# Patient Record
Sex: Female | Born: 1955 | Hispanic: No | Smoking: Never smoker
Health system: Southern US, Community
[De-identification: ages and names within clinical notes are randomized; demographics above are authoritative.]

## PROBLEM LIST (undated history)

## (undated) DIAGNOSIS — M797 Fibromyalgia: Secondary | ICD-10-CM

## (undated) DIAGNOSIS — E785 Hyperlipidemia, unspecified: Secondary | ICD-10-CM

## (undated) DIAGNOSIS — R569 Unspecified convulsions: Secondary | ICD-10-CM

## (undated) DIAGNOSIS — C719 Malignant neoplasm of brain, unspecified: Secondary | ICD-10-CM

## (undated) DIAGNOSIS — G43909 Migraine, unspecified, not intractable, without status migrainosus: Secondary | ICD-10-CM

## (undated) DIAGNOSIS — I10 Essential (primary) hypertension: Secondary | ICD-10-CM

## (undated) HISTORY — PX: APPENDECTOMY: SHX54

## (undated) HISTORY — PX: COLONOSCOPY: SHX174

## (undated) HISTORY — PX: BRAIN SURGERY: SHX531

## (undated) HISTORY — DX: Hyperlipidemia, unspecified: E78.5

---

## 1998-05-11 ENCOUNTER — Other Ambulatory Visit: Admission: RE | Admit: 1998-05-11 | Discharge: 1998-05-11 | Payer: Self-pay | Admitting: Obstetrics and Gynecology

## 1999-09-06 ENCOUNTER — Other Ambulatory Visit: Admission: RE | Admit: 1999-09-06 | Discharge: 1999-09-06 | Payer: Self-pay | Admitting: Obstetrics and Gynecology

## 2000-12-11 ENCOUNTER — Other Ambulatory Visit: Admission: RE | Admit: 2000-12-11 | Discharge: 2000-12-11 | Payer: Self-pay | Admitting: Obstetrics and Gynecology

## 2001-02-11 ENCOUNTER — Encounter: Admission: RE | Admit: 2001-02-11 | Discharge: 2001-05-12 | Payer: Self-pay | Admitting: Family Medicine

## 2011-09-24 ENCOUNTER — Ambulatory Visit (INDEPENDENT_AMBULATORY_CARE_PROVIDER_SITE_OTHER): Payer: BC Managed Care – PPO | Admitting: Family Medicine

## 2011-09-24 DIAGNOSIS — G43909 Migraine, unspecified, not intractable, without status migrainosus: Secondary | ICD-10-CM

## 2011-09-24 DIAGNOSIS — R5383 Other fatigue: Secondary | ICD-10-CM

## 2011-09-24 DIAGNOSIS — R5381 Other malaise: Secondary | ICD-10-CM

## 2011-09-24 DIAGNOSIS — I10 Essential (primary) hypertension: Secondary | ICD-10-CM | POA: Insufficient documentation

## 2011-09-24 DIAGNOSIS — E785 Hyperlipidemia, unspecified: Secondary | ICD-10-CM | POA: Insufficient documentation

## 2011-09-24 LAB — POCT CBC
Granulocyte percent: 65.7 %G (ref 37–80)
HCT, POC: 43.3 % (ref 37.7–47.9)
Hemoglobin: 13.8 g/dL (ref 12.2–16.2)
Lymph, poc: 2.8 (ref 0.6–3.4)
MCH, POC: 30.6 pg (ref 27–31.2)
MCHC: 31.9 g/dL (ref 31.8–35.4)
MCV: 95.9 fL (ref 80–97)
MID (cbc): 0.5 (ref 0–0.9)
MPV: 8.1 fL (ref 0–99.8)
POC Granulocyte: 6.2 (ref 2–6.9)
POC LYMPH PERCENT: 29.2 %L (ref 10–50)
POC MID %: 5.1 %M (ref 0–12)
Platelet Count, POC: 347 10*3/uL (ref 142–424)
RBC: 4.51 M/uL (ref 4.04–5.48)
RDW, POC: 13.7 %
WBC: 9.5 10*3/uL (ref 4.6–10.2)

## 2011-09-24 MED ORDER — LISINOPRIL-HYDROCHLOROTHIAZIDE 10-12.5 MG PO TABS: 1.0000 | ORAL_TABLET | Freq: Every day | ORAL | Status: DC

## 2011-09-24 MED ORDER — TOPIRAMATE 50 MG PO TABS: 50.0000 mg | ORAL_TABLET | Freq: Two times a day (BID) | ORAL | Status: DC

## 2011-09-24 NOTE — Progress Notes (Signed)
Addended by: Elvina Sidle on: 09/24/2011 07:58 PM   Modules accepted: Orders

## 2011-09-24 NOTE — Patient Instructions (Addendum)
Migraine Headache A migraine headache is an intense, throbbing pain on one or both sides of your head. The exact cause of a migraine headache is not always known. A migraine may be caused when nerves in the brain become irritated and release chemicals that cause swelling within blood vessels, causing pain. Many migraine sufferers have a family history of migraines. Before you get a migraine you may or may not get an aura. An aura is a group of symptoms that can predict the beginning of a migraine. An aura may include:  Visual changes such as:   Flashing lights.   Bright spots or zig-zag lines.   Tunnel vision.   Feelings of numbness.   Trouble talking.   Muscle weakness.  SYMPTOMS  Pain on one or both sides of your head.   Pain that is pulsating or throbbing in nature.   Pain that is severe enough to prevent daily activities.   Pain that is aggravated by any daily physical activity.   Nausea (feeling sick to your stomach), vomiting, or both.   Pain with exposure to bright lights, loud noises, or activity.   General sensitivity to bright lights or loud noises.  MIGRAINE TRIGGERS Examples of triggers of migraine headaches include:   Alcohol.   Smoking.   Stress.   It may be related to menses (female menstruation).   Aged cheeses.   Foods or drinks that contain nitrates, glutamate, aspartame, or tyramine.   Lack of sleep.   Chocolate.   Caffeine.   Hunger.   Medications such as nitroglycerine (used to treat chest pain), birth control pills, estrogen, and some blood pressure medications.  DIAGNOSIS  A migraine headache is often diagnosed based on:  Symptoms.   Physical examination.   A computerized X-ray scan (computed tomography, CT) of your head.  TREATMENT  Medications can help prevent migraines if they are recurrent or should they become recurrent. Your caregiver can help you with a medication or treatment program that will be helpful to you.   Lying  down in a dark, quiet room may be helpful.   Keeping a headache diary may help you find a trend as to what may be triggering your headaches.  SEEK IMMEDIATE MEDICAL CARE IF:   You have confusion, personality changes or seizures.   You have headaches that wake you from sleep.   You have an increased frequency in your headaches.   You have a stiff neck.   You have a loss of vision.   You have muscle weakness.   You start losing your balance or have trouble walking.   You feel faint or pass out.  MAKE SURE YOU:   Understand these instructions.   Will watch your condition.   Will get help right away if you are not doing well or get worse.  Document Released: 08/11/2005 Document Revised: 04/23/2011 Document Reviewed: 03/27/2009 [facility] Patient Information 2012 [place], [place].Hypertension As your heart beats, it forces blood through your arteries. This force is your blood pressure. If the pressure is too high, it is called hypertension (HTN) or high blood pressure. HTN is dangerous because you may have it and not know it. High blood pressure may mean that your heart has to work harder to pump blood. Your arteries may be narrow or stiff. The extra work puts you at risk for heart disease, stroke, and other problems.  Blood pressure consists of two numbers, a higher number over a lower, 110/72, for example. It is stated as "110  over 72." The ideal is below 120 for the top number (systolic) and under 80 for the bottom (diastolic). Write down your blood pressure today. You should pay close attention to your blood pressure if you have certain conditions such as:  Heart failure.   Prior heart attack.   Diabetes   Chronic kidney disease.   Prior stroke.   Multiple risk factors for heart disease.  To see if you have HTN, your blood pressure should be measured while you are seated with your arm held at the level of the heart. It should be measured at least twice. A one-time elevated  blood pressure reading (especially in the Emergency Department) does not mean that you need treatment. There may be conditions in which the blood pressure is different between your right and left arms. It is important to see your caregiver soon for a recheck. Most people have essential hypertension which means that there is not a specific cause. This type of high blood pressure may be lowered by changing lifestyle factors such as:  Stress.   Smoking.   Lack of exercise.   Excessive weight.   Drug/tobacco/alcohol use.   Eating less salt.  Most people do not have symptoms from high blood pressure until it has caused damage to the body. Effective treatment can often prevent, delay or reduce that damage. TREATMENT  When a cause has been identified, treatment for high blood pressure is directed at the cause. There are a large number of medications to treat HTN. These fall into several categories, and your caregiver will help you select the medicines that are best for you. Medications may have side effects. You should review side effects with your caregiver. If your blood pressure stays high after you have made lifestyle changes or started on medicines,   Your medication(s) may need to be changed.   Other problems may need to be addressed.   Be certain you understand your prescriptions, and know how and when to take your medicine.   Be sure to follow up with your caregiver within the time frame advised (usually within two weeks) to have your blood pressure rechecked and to review your medications.   If you are taking more than one medicine to lower your blood pressure, make sure you know how and at what times they should be taken. Taking two medicines at the same time can result in blood pressure that is too low.  SEEK IMMEDIATE MEDICAL CARE IF:  You develop a severe headache, blurred or changing vision, or confusion.   You have unusual weakness or numbness, or a faint feeling.   You have  severe chest or abdominal pain, vomiting, or breathing problems.  MAKE SURE YOU:   Understand these instructions.   Will watch your condition.   Will get help right away if you are not doing well or get worse.  Document Released: 08/11/2005 Document Revised: 04/23/2011 Document Reviewed: 03/31/2008 [facility] Patient Information 2012 [place], [place].Menopause Menopause is the normal time of life when menstrual periods stop completely. Menopause is complete when you have missed 12 consecutive menstrual periods. It usually occurs between the ages of 93 to 13, with an average age of 33. Very rarely does a woman develop menopause before 55 years old. At menopause, your ovaries stop producing the female hormones, estrogen and progesterone. This can cause undesirable symptoms and also affect your health. Sometimes the symptoms may occur 4 to 5 years before the menopause begins. There is no relationship between menopause and:  Oral contraceptives.   Number of children you had.   Race.   The age your menstrual periods started (menarche).  Heavy smokers and very thin women may develop menopause earlier in life. CAUSES  The ovaries stop producing the female hormones estrogen and progesterone.   Other causes include:   Surgery to remove both ovaries.   The ovaries stop functioning for no known reason.   Tumors of the pituitary gland in the brain.   Medical disease that affects the ovaries and hormone production.   Radiation treatment to the abdomen or pelvis.   Chemotherapy that affects the ovaries.  SYMPTOMS   Hot flashes.   Night sweats.   Decrease in sex drive.   Vaginal dryness and thinning of the vagina causing painful intercourse.   Dryness of the skin and developing wrinkles.   Headaches.   Tiredness.   Irritability.   Memory problems.   Weight gain.   Bladder infections.   Hair growth of the face and chest.   Infertility.  More serious symptoms  include:  Loss of bone (osteoporosis) causing breaks (fractures).   Depression.   Hardening and narrowing of the arteries (atherosclerosis) causing heart attacks and strokes.  DIAGNOSIS   When the menstrual periods have stopped for 12 straight months.   Physical exam.   Hormone studies of the blood.  TREATMENT  There are many treatment choices and nearly as many questions about them. The decisions to treat or not to treat menopausal changes is an individual choice made with your caregiver. Your caregiver can discuss the treatments with you. Together, you can decide which treatment will work best for you. Your treatment choices may include:   Hormone therapy (estorgen and progesterone).   Non-hormonal medications.   Treating the individual symptoms with medication (for example antidepressants for depression).   Herbal medications that may help specific symptoms.   Counseling by a psychiatrist or psychologist.   Group therapy.   Lifestyle changes including:   Eating healthy.   Regular exercise.   Limiting caffeine and alcohol.   Stress management and meditation.   No treatment.  HOME CARE INSTRUCTIONS   Take the medication your caregiver gives you as directed.   Get plenty of sleep and rest.   Exercise regularly.   Eat a diet that contains calcium (good for the bones) and soy products (acts like estrogen hormone).   Avoid alcoholic beverages.   Do not smoke.   If you have hot flashes, dress in layers.   Take supplements, calcium and vitamin D to strengthen bones.   You can use over-the-counter lubricants or moisturizers for vaginal dryness.   Group therapy is sometimes very helpful.   Acupuncture may be helpful in some cases.  SEEK MEDICAL CARE IF:   You are not sure you are in menopause.   You are having menopausal symptoms and need advice and treatment.   You are still having menstrual periods after age 50.   You have pain with intercourse.    Menopause is complete (no menstrual period for 12 months) and you develop vaginal bleeding.   You need a referral to a specialist (gynecologist, psychiatrist or psychologist) for treatment.  SEEK IMMEDIATE MEDICAL CARE IF:   You have severe depression.   You have excessive vaginal bleeding.   You fell and think you have a broken bone.   You have pain when you urinate.   You develop leg or chest pain.   You have a fast pounding heart beat (  palpitations).   You have severe headaches.   You develop vision problems.   You feel a lump in your breast.   You have abdominal pain or severe indigestion.  Document Released: 11/01/2003 Document Revised: 04/23/2011 Document Reviewed: 06/08/2008 [facility] Patient Information 2012 [place], [place].

## 2011-09-24 NOTE — Progress Notes (Signed)
  Subjective:    Patient ID: Jennifer [name], female    DOB: 06-08-1956, 56 y.o.   MRN: [id number]  Hypertension This is a recurrent problem. The current episode started in the past 7 days. The problem has been gradually improving since onset. The problem is uncontrolled. Associated symptoms include headaches. There are no associated agents to hypertension. Risk factors for coronary artery disease include dyslipidemia.  Migraine  Associated symptoms include back pain. Her past medical history is significant for hypertension.  Vomited last week at the onset of the migraine which lasted 2 days and was associated with very high blood pressure.  Blood pressure at another urgent medical center was over 200 systolic and patient was started on Lisinopril one week ago.  Also, patient complains of 7 years of hot flashes.  Review of Systems  Constitutional: Positive for fatigue (sleeping 6-7 hours a night.  Severe fatigue for 3 months).  Eyes: Negative.   Respiratory: Negative.   Cardiovascular: Negative.   Gastrointestinal: Negative.   Musculoskeletal: Positive for back pain.  Neurological: Positive for headaches.  Hematological: Negative.   Psychiatric/Behavioral: Negative.        Objective:   Physical Exam  Constitutional: She is oriented to person, place, and time. She appears well-developed.  HENT:  Head: Normocephalic and atraumatic.  Right Ear: External ear normal.  Left Ear: External ear normal.  Eyes: Conjunctivae and EOM are normal. Pupils are equal, round, and reactive to light.  Neck: Normal range of motion. Neck supple.  Cardiovascular: Normal rate, regular rhythm, normal heart sounds and intact distal pulses.   Pulmonary/Chest: Effort normal and breath sounds normal.  Musculoskeletal: Normal range of motion.  Neurological: She is alert and oriented to person, place, and time. She has normal reflexes.  Skin: Skin is warm.  Normal fundi        Assessment & Plan:    Hypertension, better control with abrupt onset assoc with migraine last week.

## 2011-09-25 ENCOUNTER — Encounter: Payer: Self-pay | Admitting: Family Medicine

## 2011-09-25 LAB — COMPREHENSIVE METABOLIC PANEL
ALT: 19 U/L (ref 0–35)
AST: 18 U/L (ref 0–37)
Albumin: 4.6 g/dL (ref 3.5–5.2)
Alkaline Phosphatase: 62 U/L (ref 39–117)
BUN: 28 mg/dL — ABNORMAL HIGH (ref 6–23)
CO2: 28 mEq/L (ref 19–32)
Calcium: 9.9 mg/dL (ref 8.4–10.5)
Chloride: 102 mEq/L (ref 96–112)
Creat: 1.01 mg/dL (ref 0.50–1.10)
Glucose, Bld: 90 mg/dL (ref 70–99)
Potassium: 4.6 mEq/L (ref 3.5–5.3)
Sodium: 140 mEq/L (ref 135–145)
Total Bilirubin: 0.4 mg/dL (ref 0.3–1.2)
Total Protein: 6.6 g/dL (ref 6.0–8.3)

## 2011-09-25 LAB — LIPID PANEL
Cholesterol: 272 mg/dL — ABNORMAL HIGH (ref 0–200)
HDL: 41 mg/dL (ref >39)
LDL Cholesterol: 187 mg/dL — ABNORMAL HIGH (ref 0–99)
Total CHOL/HDL Ratio: 6.6 Ratio
Triglycerides: 219 mg/dL — ABNORMAL HIGH (ref <150)
VLDL: 44 mg/dL — ABNORMAL HIGH (ref 0–40)

## 2011-09-25 LAB — TSH: TSH: 2.146 u[IU]/mL (ref 0.350–4.500)

## 2011-09-26 ENCOUNTER — Telehealth: Payer: Self-pay

## 2011-09-26 NOTE — Telephone Encounter (Signed)
Called Pt and gave her lab results from 09/24/11. Explained she should RTC fasting to recheck lipids in 4 months. Pt voiced understanding. Mailed Dr. Cain Saupe letter and labs to PT today. JF

## 2011-09-26 NOTE — Telephone Encounter (Signed)
Pt would like to know if her labs are in yet.

## 2011-09-27 ENCOUNTER — Encounter: Payer: Self-pay | Admitting: Family Medicine

## 2012-04-16 ENCOUNTER — Encounter: Payer: Self-pay | Admitting: Family Medicine

## 2012-04-16 ENCOUNTER — Ambulatory Visit (INDEPENDENT_AMBULATORY_CARE_PROVIDER_SITE_OTHER): Payer: BC Managed Care – PPO | Admitting: Family Medicine

## 2012-04-16 VITALS — BP 172/95 | HR 81 | Temp 97.1°F | Resp 16 | Ht 62.0 in | Wt 165.0 lb

## 2012-04-16 DIAGNOSIS — G894 Chronic pain syndrome: Secondary | ICD-10-CM

## 2012-04-16 DIAGNOSIS — G4700 Insomnia, unspecified: Secondary | ICD-10-CM

## 2012-04-16 DIAGNOSIS — G43909 Migraine, unspecified, not intractable, without status migrainosus: Secondary | ICD-10-CM

## 2012-04-16 DIAGNOSIS — I10 Essential (primary) hypertension: Secondary | ICD-10-CM

## 2012-04-16 MED ORDER — OXYCODONE-ACETAMINOPHEN 5-325 MG PO TABS: 1.0000 | ORAL_TABLET | Freq: Three times a day (TID) | ORAL | Status: AC | PRN

## 2012-04-16 MED ORDER — METOPROLOL SUCCINATE ER 100 MG PO TB24: 100.0000 mg | ORAL_TABLET | Freq: Every day | ORAL | Status: DC

## 2012-04-16 MED ORDER — HYDROCHLOROTHIAZIDE 25 MG PO TABS: 25.0000 mg | ORAL_TABLET | Freq: Every day | ORAL | Status: DC
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
## 2012-04-16 NOTE — Patient Instructions (Signed)
Insomnia Insomnia is frequent trouble falling and/or staying asleep. Insomnia can be a long term problem or a short term problem. Both are common. Insomnia can be a short term problem when the wakefulness is related to a certain stress or worry. Long term insomnia is often related to ongoing stress during waking hours and/or poor sleeping habits. Overtime, sleep deprivation itself can make the problem worse. Every little thing feels more severe because you are overtired and your ability to cope is decreased. CAUSES   Stress, anxiety, and depression.   Poor sleeping habits.   Distractions such as TV in the bedroom.   Naps close to bedtime.   Engaging in emotionally charged conversations before bed.   Technical reading before sleep.   Alcohol and other sedatives. They may make the problem worse. They can hurt normal sleep patterns and normal dream activity.   Stimulants such as caffeine for several hours prior to bedtime.   Pain syndromes and shortness of breath can cause insomnia.   Exercise late at night.   Changing time zones may cause sleeping problems (jet lag).  It is sometimes helpful to have someone observe your sleeping patterns. They should look for periods of not breathing during the night (sleep apnea). They should also look to see how long those periods last. If you live alone or observers are uncertain, you can also be observed at a sleep clinic where your sleep patterns will be professionally monitored. Sleep apnea requires a checkup and treatment. Give your caregivers your medical history. Give your caregivers observations your family has made about your sleep.  SYMPTOMS   Not feeling rested in the morning.   Anxiety and restlessness at bedtime.   Difficulty falling and staying asleep.  TREATMENT   Your caregiver may prescribe treatment for an underlying medical disorders. Your caregiver can give advice or help if you are using alcohol or other drugs for  self-medication. Treatment of underlying problems will usually eliminate insomnia problems.   Medications can be prescribed for short time use. They are generally not recommended for lengthy use.   Over-the-counter sleep medicines are not recommended for lengthy use. They can be habit forming.   You can promote easier sleeping by making lifestyle changes such as:   Using relaxation techniques that help with breathing and reduce muscle tension.   Exercising earlier in the day.   Changing your diet and the time of your last meal. No night time snacks.   Establish a regular time to go to bed.   Counseling can help with stressful problems and worry.   Soothing music and white noise may be helpful if there are background noises you cannot remove.   Stop tedious detailed work at least one hour before bedtime.  HOME CARE INSTRUCTIONS   Keep a diary. Inform your caregiver about your progress. This includes any medication side effects. See your caregiver regularly. Take note of:   Times when you are asleep.   Times when you are awake during the night.   The quality of your sleep.   How you feel the next day.  This information will help your caregiver care for you.  Get out of bed if you are still awake after 15 minutes. Read or do some quiet activity. Keep the lights down. Wait until you feel sleepy and go back to bed.   Keep regular sleeping and waking hours. Avoid naps.   Exercise regularly.   Avoid distractions at bedtime. Distractions include watching television or engaging   in any intense or detailed activity like attempting to balance the household checkbook.   Develop a bedtime ritual. Keep a familiar routine of bathing, brushing your teeth, climbing into bed at the same time each night, listening to soothing music. Routines increase the success of falling to sleep faster.   Use relaxation techniques. This can be using breathing and muscle tension release routines. It can  also include visualizing peaceful scenes. You can also help control troubling or intruding thoughts by keeping your mind occupied with boring or repetitive thoughts like the old concept of counting sheep. You can make it more creative like imagining planting one beautiful flower after another in your backyard garden.   During your day, work to eliminate stress. When this is not possible use some of the previous suggestions to help reduce the anxiety that accompanies stressful situations.  MAKE SURE YOU:   Understand these instructions.   Will watch your condition.   Will get help right away if you are not doing well or get worse.  Document Released: 08/08/2000 Document Revised: 07/31/2011 Document Reviewed: 09/08/2007 [facility] Patient Information 2012 [place], [place].   Migraine Headache A migraine headache is an intense, throbbing pain on one or both sides of your head. The exact cause of a migraine headache is not always known. A migraine may be caused when nerves in the brain become irritated and release chemicals that cause swelling within blood vessels, causing pain. Many migraine sufferers have a family history of migraines. Before you get a migraine you may or may not get an aura. An aura is a group of symptoms that can predict the beginning of a migraine. An aura may include:  Visual changes such as:   Flashing lights.   Bright spots or zig-zag lines.   Tunnel vision.   Feelings of numbness.   Trouble talking.   Muscle weakness.  SYMPTOMS  Pain on one or both sides of your head.   Pain that is pulsating or throbbing in nature.   Pain that is severe enough to prevent daily activities.   Pain that is aggravated by any daily physical activity.   Nausea (feeling sick to your stomach), vomiting, or both.   Pain with exposure to bright lights, loud noises, or activity.   General sensitivity to bright lights or loud noises.  MIGRAINE TRIGGERS Examples of triggers of  migraine headaches include:   Alcohol.   Smoking.   Stress.   It may be related to menses (female menstruation).   Aged cheeses.   Foods or drinks that contain nitrates, glutamate, aspartame, or tyramine.   Lack of sleep.   Chocolate.   Caffeine.   Hunger.   Medications such as nitroglycerine (used to treat chest pain), birth control pills, estrogen, and some blood pressure medications.  DIAGNOSIS  A migraine headache is often diagnosed based on:  Symptoms.   Physical examination.   A computerized X-ray scan (computed tomography, CT) of your head.  TREATMENT  Medications can help prevent migraines if they are recurrent or should they become recurrent. Your caregiver can help you with a medication or treatment program that will be helpful to you.   Lying down in a dark, quiet room may be helpful.   Keeping a headache diary may help you find a trend as to what may be triggering your headaches.  SEEK IMMEDIATE MEDICAL CARE IF:   You have confusion, personality changes or seizures.   You have headaches that wake you from sleep.  You have an increased frequency in your headaches.   You have a stiff neck.   You have a loss of vision.   You have muscle weakness.   You start losing your balance or have trouble walking.   You feel faint or pass out.  MAKE SURE YOU:   Understand these instructions.   Will watch your condition.   Will get help right away if you are not doing well or get worse.  Document Released: 08/11/2005 Document Revised: 07/31/2011 Document Reviewed: 03/27/2009 [facility] Patient Information 2012 [place], [place].   Recurrent Migraine Headache You have a recurrent migraine headache. The caregiver can usually provide good relief for this headache. If this headache is the same as your previous migraine headaches, it is safe to treat you without repeating a complete evaluation.  These headaches usually have at least two of the following  problems:   They occur on one side of the head, pulsate, and are severe enough to prevent daily activities.   They are aggravated by daily physical activities.  You may have one or more of the following symptoms:   Nausea (feeling sick to your stomach).   Vomiting.   Pain with exposure to bright lights or loud noises.  Most headache sufferers have a family history of migraines. Your headaches may also be related to alcohol and smoking habits. Too much sleep, too little sleep, mood, and anxiety may also play a part. Changing some of these triggers may help you lower the number and level of pain of the headaches. Headaches may be related to menses (female menstruation). There are numerous medications that can prevent these headaches. Your caregiver can help you with a medication or regimen (procedure to follow). If this has been a chronic (long-term) condition, the use of long-term narcotics is not recommended. Using long-term narcotics can cause recurrent migraines. Narcotics are only a temporary measure only. They are used for the infrequent migraine that fails to respond to all other measures. SEEK MEDICAL CARE IF:   You do not get relief from the medications given to you.   You have a recurrence of pain.   This headache begins to differ from past migraine (for example if it is more severe).  SEEK IMMEDIATE MEDICAL CARE IF:  You have a fever.   You have a stiff neck.   You have vision loss or have changes in vision.   You have problems with feeling lightheaded, become faint, or lose your balance.   You have muscular weakness.   You have loss of muscular control.   You develop severe symptoms different from your first symptoms.   You start losing your balance or have trouble walking.   You feel faint or pass out.  MAKE SURE YOU:   Understand these instructions.   Will watch your condition.   Will get help right away if you are not doing well or get worse.  Document  Released: 05/06/2001 Document Revised: 07/31/2011 Document Reviewed: 03/30/2008 [facility] Patient Information 2012 [place], [place].

## 2012-07-15 ENCOUNTER — Other Ambulatory Visit: Payer: Self-pay

## 2012-07-15 ENCOUNTER — Inpatient Hospital Stay (HOSPITAL_COMMUNITY)
Admission: EM | Admit: 2012-07-15 | Discharge: 2012-07-17 | DRG: 883 | Disposition: A | Discharge status: Home / Self Care | Payer: BC Managed Care – PPO | Attending: General Surgery | Admitting: General Surgery

## 2012-07-15 ENCOUNTER — Emergency Department (HOSPITAL_COMMUNITY): Payer: BC Managed Care – PPO | Admitting: Anesthesiology

## 2012-07-15 ENCOUNTER — Emergency Department (HOSPITAL_COMMUNITY): Payer: BC Managed Care – PPO

## 2012-07-15 ENCOUNTER — Encounter (HOSPITAL_COMMUNITY): Payer: Self-pay | Admitting: Physical Medicine and Rehabilitation

## 2012-07-15 ENCOUNTER — Encounter (HOSPITAL_COMMUNITY): Admission: EM | Disposition: A | Discharge status: Home / Self Care | Payer: Self-pay | Source: Home / Self Care

## 2012-07-15 ENCOUNTER — Inpatient Hospital Stay: Admit: 2012-07-15 | Payer: Self-pay | Admitting: General Surgery

## 2012-07-15 ENCOUNTER — Encounter (HOSPITAL_COMMUNITY): Payer: Self-pay | Admitting: Anesthesiology

## 2012-07-15 DIAGNOSIS — K3580 Unspecified acute appendicitis: Secondary | ICD-10-CM

## 2012-07-15 DIAGNOSIS — I10 Essential (primary) hypertension: Secondary | ICD-10-CM | POA: Diagnosis present

## 2012-07-15 DIAGNOSIS — N12 Tubulo-interstitial nephritis, not specified as acute or chronic: Secondary | ICD-10-CM

## 2012-07-15 DIAGNOSIS — K37 Unspecified appendicitis: Secondary | ICD-10-CM

## 2012-07-15 DIAGNOSIS — K35209 Acute appendicitis with generalized peritonitis, without abscess, unspecified as to perforation: Principal | ICD-10-CM | POA: Diagnosis present

## 2012-07-15 DIAGNOSIS — K352 Acute appendicitis with generalized peritonitis, without abscess: Principal | ICD-10-CM | POA: Diagnosis present

## 2012-07-15 DIAGNOSIS — N390 Urinary tract infection, site not specified: Secondary | ICD-10-CM | POA: Diagnosis present

## 2012-07-15 DIAGNOSIS — R109 Unspecified abdominal pain: Secondary | ICD-10-CM

## 2012-07-15 DIAGNOSIS — Y92009 Unspecified place in unspecified non-institutional (private) residence as the place of occurrence of the external cause: Secondary | ICD-10-CM

## 2012-07-15 DIAGNOSIS — N289 Disorder of kidney and ureter, unspecified: Secondary | ICD-10-CM | POA: Diagnosis present

## 2012-07-15 DIAGNOSIS — E876 Hypokalemia: Secondary | ICD-10-CM

## 2012-07-15 DIAGNOSIS — R112 Nausea with vomiting, unspecified: Secondary | ICD-10-CM

## 2012-07-15 DIAGNOSIS — G43909 Migraine, unspecified, not intractable, without status migrainosus: Secondary | ICD-10-CM

## 2012-07-15 DIAGNOSIS — G40909 Epilepsy, unspecified, not intractable, without status epilepticus: Secondary | ICD-10-CM | POA: Diagnosis present

## 2012-07-15 DIAGNOSIS — T502X5A Adverse effect of carbonic-anhydrase inhibitors, benzothiadiazides and other diuretics, initial encounter: Secondary | ICD-10-CM | POA: Diagnosis present

## 2012-07-15 HISTORY — PX: LAPAROSCOPIC APPENDECTOMY: SHX408

## 2012-07-15 HISTORY — DX: Migraine, unspecified, not intractable, without status migrainosus: G43.909

## 2012-07-15 HISTORY — DX: Essential (primary) hypertension: I10

## 2012-07-15 LAB — CBC WITH DIFFERENTIAL/PLATELET
Basophils Absolute: 0 10*3/uL (ref 0.0–0.1)
Basophils Relative: 0 % (ref 0–1)
Eosinophils Absolute: 0 10*3/uL (ref 0.0–0.7)
Eosinophils Relative: 0 % (ref 0–5)
HCT: 38.3 % (ref 36.0–46.0)
Hemoglobin: 13.7 g/dL (ref 12.0–15.0)
Lymphocytes Relative: 5 % — ABNORMAL LOW (ref 12–46)
Lymphs Abs: 1.3 10*3/uL (ref 0.7–4.0)
MCH: 32.6 pg (ref 26.0–34.0)
MCHC: 35.8 g/dL (ref 30.0–36.0)
MCV: 91.2 fL (ref 78.0–100.0)
Monocytes Absolute: 1.2 10*3/uL — ABNORMAL HIGH (ref 0.1–1.0)
Monocytes Relative: 4 % (ref 3–12)
Neutro Abs: 25.9 10*3/uL — ABNORMAL HIGH (ref 1.7–7.7)
Neutrophils Relative %: 91 % — ABNORMAL HIGH (ref 43–77)
Platelets: 337 10*3/uL (ref 150–400)
RBC: 4.2 MIL/uL (ref 3.87–5.11)
RDW: 12.7 % (ref 11.5–15.5)
WBC: 28.5 10*3/uL — ABNORMAL HIGH (ref 4.0–10.5)

## 2012-07-15 LAB — POCT I-STAT 4, (NA,K, GLUC, HGB,HCT)
Glucose, Bld: 122 mg/dL — ABNORMAL HIGH (ref 70–99)
HCT: 35 % — ABNORMAL LOW (ref 36.0–46.0)
Hemoglobin: 11.9 g/dL — ABNORMAL LOW (ref 12.0–15.0)
Potassium: 2.6 mEq/L — CL (ref 3.5–5.1)
Sodium: 136 mEq/L (ref 135–145)

## 2012-07-15 LAB — COMPREHENSIVE METABOLIC PANEL
ALT: 10 U/L (ref 0–35)
AST: 15 U/L (ref 0–37)
Albumin: 3.9 g/dL (ref 3.5–5.2)
Alkaline Phosphatase: 69 U/L (ref 39–117)
BUN: 26 mg/dL — ABNORMAL HIGH (ref 6–23)
CO2: 28 mEq/L (ref 19–32)
Calcium: 9.8 mg/dL (ref 8.4–10.5)
Chloride: 92 mEq/L — ABNORMAL LOW (ref 96–112)
Creatinine, Ser: 1.31 mg/dL — ABNORMAL HIGH (ref 0.50–1.10)
GFR calc Af Amer: 52 mL/min — ABNORMAL LOW (ref >90)
GFR calc non Af Amer: 45 mL/min — ABNORMAL LOW (ref >90)
Glucose, Bld: 131 mg/dL — ABNORMAL HIGH (ref 70–99)
Potassium: 2.3 mEq/L — CL (ref 3.5–5.1)
Sodium: 134 mEq/L — ABNORMAL LOW (ref 135–145)
Total Bilirubin: 1.1 mg/dL (ref 0.3–1.2)
Total Protein: 8.2 g/dL (ref 6.0–8.3)

## 2012-07-15 LAB — LACTIC ACID, PLASMA: Lactic Acid, Venous: 1.1 mmol/L (ref 0.5–2.2)

## 2012-07-15 LAB — URINE MICROSCOPIC-ADD ON

## 2012-07-15 LAB — URINALYSIS, ROUTINE W REFLEX MICROSCOPIC
Glucose, UA: NEGATIVE mg/dL
Ketones, ur: 15 mg/dL — AB
Nitrite: POSITIVE — AB
Protein, ur: >300 mg/dL — AB
Specific Gravity, Urine: 1.034 — ABNORMAL HIGH (ref 1.005–1.030)
Urobilinogen, UA: 1 mg/dL (ref 0.0–1.0)
pH: 5.5 (ref 5.0–8.0)

## 2012-07-15 LAB — PREGNANCY, URINE: Preg Test, Ur: NEGATIVE

## 2012-07-15 LAB — LIPASE, BLOOD: Lipase: 12 U/L (ref 11–59)

## 2012-07-15 SURGERY — APPENDECTOMY, LAPAROSCOPIC
Anesthesia: General | Site: Abdomen | Wound class: Dirty or Infected

## 2012-07-15 MED ORDER — POTASSIUM CHLORIDE 10 MEQ/100ML IV SOLN
10.0000 meq | INTRAVENOUS | Status: AC
  Administered 2012-07-15 (×2): 10 meq via INTRAVENOUS
  Filled 2012-07-15: qty 200

## 2012-07-15 MED ORDER — MORPHINE SULFATE 4 MG/ML IJ SOLN
4.0000 mg | Freq: Once | INTRAMUSCULAR | Status: AC
  Administered 2012-07-15: 4 mg via INTRAVENOUS
  Filled 2012-07-15: qty 1

## 2012-07-15 MED ORDER — SODIUM CHLORIDE 0.9 % IV BOLUS (SEPSIS)
1000.0000 mL | Freq: Once | INTRAVENOUS | Status: AC
  Administered 2012-07-15: 1000 mL via INTRAVENOUS

## 2012-07-15 MED ORDER — HYDROMORPHONE HCL PF 1 MG/ML IJ SOLN: 0.2500 mg | INTRAMUSCULAR | Status: DC | PRN

## 2012-07-15 MED ORDER — GLYCOPYRROLATE 0.2 MG/ML IJ SOLN
INTRAMUSCULAR | Status: DC | PRN
  Administered 2012-07-15: 0.4 mg via INTRAVENOUS

## 2012-07-15 MED ORDER — LEVOFLOXACIN IN D5W 750 MG/150ML IV SOLN: 750.0000 mg | Freq: Once | INTRAVENOUS | Status: DC

## 2012-07-15 MED ORDER — OXYCODONE HCL 5 MG PO TABS: 5.0000 mg | ORAL_TABLET | Freq: Once | ORAL | Status: AC | PRN

## 2012-07-15 MED ORDER — NEOSTIGMINE METHYLSULFATE 1 MG/ML IJ SOLN
INTRAMUSCULAR | Status: DC | PRN
  Administered 2012-07-15: 3 mg via INTRAVENOUS

## 2012-07-15 MED ORDER — MIDAZOLAM HCL 5 MG/5ML IJ SOLN
INTRAMUSCULAR | Status: DC | PRN
  Administered 2012-07-15: 2 mg via INTRAVENOUS

## 2012-07-15 MED ORDER — IOHEXOL 300 MG/ML  SOLN
20.0000 mL | INTRAMUSCULAR | Status: AC
  Administered 2012-07-15: 20 mL via ORAL

## 2012-07-15 MED ORDER — ONDANSETRON HCL 4 MG/2ML IJ SOLN
4.0000 mg | Freq: Once | INTRAMUSCULAR | Status: AC
  Administered 2012-07-15: 4 mg via INTRAVENOUS
  Filled 2012-07-15: qty 2

## 2012-07-15 MED ORDER — ONDANSETRON HCL 4 MG/2ML IJ SOLN
INTRAMUSCULAR | Status: DC | PRN
  Administered 2012-07-15: 4 mg via INTRAVENOUS

## 2012-07-15 MED ORDER — SUFENTANIL CITRATE 50 MCG/ML IV SOLN
INTRAVENOUS | Status: DC | PRN
  Administered 2012-07-15 (×2): 10 ug via INTRAVENOUS

## 2012-07-15 MED ORDER — IOHEXOL 300 MG/ML  SOLN
100.0000 mL | Freq: Once | INTRAMUSCULAR | Status: AC | PRN
  Administered 2012-07-15: 100 mL via INTRAVENOUS

## 2012-07-15 MED ORDER — POTASSIUM CHLORIDE 10 MEQ/100ML IV SOLN
10.0000 meq | INTRAVENOUS | Status: DC
  Filled 2012-07-15: qty 100

## 2012-07-15 MED ORDER — LIDOCAINE HCL (CARDIAC) 20 MG/ML IV SOLN
INTRAVENOUS | Status: DC | PRN
  Administered 2012-07-15: 100 mg via INTRAVENOUS

## 2012-07-15 MED ORDER — PHENYLEPHRINE HCL 10 MG/ML IJ SOLN
INTRAMUSCULAR | Status: DC | PRN
  Administered 2012-07-15 (×2): 40 ug via INTRAVENOUS

## 2012-07-15 MED ORDER — PROPOFOL 10 MG/ML IV BOLUS
INTRAVENOUS | Status: DC | PRN
  Administered 2012-07-15: 180 mg via INTRAVENOUS

## 2012-07-15 MED ORDER — OXYCODONE HCL 5 MG/5ML PO SOLN: 5.0000 mg | Freq: Once | ORAL | Status: AC | PRN

## 2012-07-15 MED ORDER — METRONIDAZOLE IN NACL 5-0.79 MG/ML-% IV SOLN
500.0000 mg | Freq: Once | INTRAVENOUS | Status: AC
Start: 2012-07-15 — End: 2012-07-15
  Administered 2012-07-15: 500 mg via INTRAVENOUS
  Filled 2012-07-15: qty 100

## 2012-07-15 MED ORDER — BUPIVACAINE-EPINEPHRINE 0.25% -1:200000 IJ SOLN
INTRAMUSCULAR | Status: DC | PRN
  Administered 2012-07-15: 10 mL

## 2012-07-15 MED ORDER — PROMETHAZINE HCL 25 MG/ML IJ SOLN: 6.2500 mg | INTRAMUSCULAR | Status: DC | PRN

## 2012-07-15 MED ORDER — DEXAMETHASONE SODIUM PHOSPHATE 4 MG/ML IJ SOLN
INTRAMUSCULAR | Status: DC | PRN
  Administered 2012-07-15: 4 mg via INTRAVENOUS

## 2012-07-15 MED ORDER — ROCURONIUM BROMIDE 100 MG/10ML IV SOLN
INTRAVENOUS | Status: DC | PRN
  Administered 2012-07-15: 40 mg via INTRAVENOUS

## 2012-07-15 MED ORDER — SODIUM CHLORIDE 0.9 % IR SOLN
Status: DC | PRN
  Administered 2012-07-15: 2000 mL

## 2012-07-15 MED ORDER — LIDOCAINE HCL 4 % MT SOLN
OROMUCOSAL | Status: DC | PRN
  Administered 2012-07-15: 4 mL via TOPICAL

## 2012-07-15 MED ORDER — POTASSIUM CHLORIDE 10 MEQ/100ML IV SOLN
10.0000 meq | INTRAVENOUS | Status: DC
  Filled 2012-07-15 (×4): qty 100

## 2012-07-15 MED ORDER — CIPROFLOXACIN IN D5W 400 MG/200ML IV SOLN
400.0000 mg | Freq: Once | INTRAVENOUS | Status: AC
  Administered 2012-07-15: 400 mg via INTRAVENOUS
  Filled 2012-07-15: qty 200

## 2012-07-15 MED ORDER — LACTATED RINGERS IV SOLN
INTRAVENOUS | Status: DC | PRN
  Administered 2012-07-15 (×2): via INTRAVENOUS

## 2012-07-15 SURGICAL SUPPLY — 46 items
APPLIER CLIP ROT 10 11.4 M/L (STAPLE)
BLADE SURG ROTATE 9660 (MISCELLANEOUS) IMPLANT
CANISTER SUCTION 2500CC (MISCELLANEOUS) ×2 IMPLANT
CHLORAPREP W/TINT 26ML (MISCELLANEOUS) ×2 IMPLANT
CLIP APPLIE ROT 10 11.4 M/L (STAPLE) IMPLANT
CLOTH BEACON ORANGE TIMEOUT ST (SAFETY) ×2 IMPLANT
COVER SURGICAL LIGHT HANDLE (MISCELLANEOUS) ×2 IMPLANT
CUTTER LINEAR ENDO 35 ETS (STAPLE) IMPLANT
CUTTER LINEAR ENDO 35 ETS TH (STAPLE) ×2 IMPLANT
DECANTER SPIKE VIAL GLASS SM (MISCELLANEOUS) ×2 IMPLANT
DERMABOND ADVANCED (GAUZE/BANDAGES/DRESSINGS) ×1
DERMABOND ADVANCED .7 DNX12 (GAUZE/BANDAGES/DRESSINGS) ×1 IMPLANT
DRAIN CHANNEL 19F RND (DRAIN) ×2 IMPLANT
DRAPE UTILITY 15X26 W/TAPE STR (DRAPE) ×4 IMPLANT
ELECT REM PT RETURN 9FT ADLT (ELECTROSURGICAL) ×2
ELECTRODE REM PT RTRN 9FT ADLT (ELECTROSURGICAL) ×1 IMPLANT
ENDOLOOP SUT PDS II  0 18 (SUTURE)
ENDOLOOP SUT PDS II 0 18 (SUTURE) IMPLANT
EVACUATOR SILICONE 100CC (DRAIN) ×2 IMPLANT
GLOVE BIO SURGEON STRL SZ8 (GLOVE) ×2 IMPLANT
GLOVE BIOGEL PI IND STRL 8 (GLOVE) ×1 IMPLANT
GLOVE BIOGEL PI INDICATOR 8 (GLOVE) ×1
GOWN PREVENTION PLUS XLARGE (GOWN DISPOSABLE) ×2 IMPLANT
GOWN STRL NON-REIN LRG LVL3 (GOWN DISPOSABLE) ×4 IMPLANT
KIT BASIN OR (CUSTOM PROCEDURE TRAY) ×2 IMPLANT
KIT ROOM TURNOVER OR (KITS) ×2 IMPLANT
NS IRRIG 1000ML POUR BTL (IV SOLUTION) ×2 IMPLANT
PAD ARMBOARD 7.5X6 YLW CONV (MISCELLANEOUS) ×4 IMPLANT
POUCH SPECIMEN RETRIEVAL 10MM (ENDOMECHANICALS) ×2 IMPLANT
RELOAD /EVU35 (ENDOMECHANICALS) IMPLANT
RELOAD CUTTER ETS 35MM STAND (ENDOMECHANICALS) IMPLANT
SCALPEL HARMONIC ACE (MISCELLANEOUS) ×2 IMPLANT
SET IRRIG TUBING LAPAROSCOPIC (IRRIGATION / IRRIGATOR) ×2 IMPLANT
SPECIMEN JAR SMALL (MISCELLANEOUS) ×2 IMPLANT
SUT ETHILON 2 0 FS 18 (SUTURE) ×2 IMPLANT
SUT VIC AB 4-0 PS2 27 (SUTURE) ×2 IMPLANT
SWAB COLLECTION DEVICE MRSA (MISCELLANEOUS) IMPLANT
TOWEL OR 17X24 6PK STRL BLUE (TOWEL DISPOSABLE) ×2 IMPLANT
TOWEL OR 17X26 10 PK STRL BLUE (TOWEL DISPOSABLE) ×2 IMPLANT
TRAY FOLEY CATH 14FR (SET/KITS/TRAYS/PACK) ×2 IMPLANT
TRAY LAPAROSCOPIC (CUSTOM PROCEDURE TRAY) ×2 IMPLANT
TROCAR HASSON GELL 12X100 (TROCAR) ×2 IMPLANT
TROCAR Z-THREAD FIOS 12X100MM (TROCAR) ×2 IMPLANT
TROCAR Z-THREAD FIOS 5X100MM (TROCAR) ×2 IMPLANT
TUBE ANAEROBIC SPECIMEN COL (MISCELLANEOUS) IMPLANT
WATER STERILE IRR 1000ML POUR (IV SOLUTION) ×2 IMPLANT

## 2012-07-15 NOTE — ED Provider Notes (Signed)
Medical screening examination/treatment/procedure(s) were conducted as a shared visit with non-physician practitioner(s) and myself.  I personally evaluated the patient during the encounter.  On my exam the patient was in no distress.  We discussed her findings, both for the pyelonephritis and acute appendicitis.  She'll be admitted for further evaluation and management.   Gerhard Munch, MD 07/15/12 [phone]

## 2012-07-15 NOTE — ED Provider Notes (Signed)
History     CSN: [id number]  Arrival date & time 07/15/12  1337   First MD Initiated Contact with Patient 07/15/12 1545      Chief Complaint  Patient presents with  . Abdominal Pain    (Consider location/radiation/quality/duration/timing/severity/associated sxs/prior treatment) The history is provided by the patient and medical records.    Jennifer [name] is a 56 y.o. female  who presents to the emergency department complaining of abdominal pain nausea and vomiting.  She states abdominal pain began suddenly yesterday morning, has been persistent and gradually increased. She was evaluated at an urgent care earlier today for the abdominal pain and sent here for CT scan.  Patient describes the pain is diffuse, nonradiating, cramping in nature and rated at an 8/10.  She's associated nausea and vomiting yesterday, persistent nausea but no vomiting today. Nothing makes the symptoms better and nothing makes the symptoms worse. Patient has a history of hypertension for which she takes HCTZ and metoprolol.  She denies fever, chills, chest pain, shortness of breath, diarrhea, constipation, weakness, dizziness, dysuria, fatigue, hematuria, urgency, syncope.   Past Medical History  Diagnosis Date  . Hypertension   . Migraine     No past surgical history on file.  No family history on file.  History  Substance Use Topics  . Smoking status: Never Smoker   . Smokeless tobacco: Not on file  . Alcohol Use: Yes    OB History    Grav Para Term Preterm Abortions TAB SAB Ect Mult Living                  Review of Systems  Constitutional: Negative for fever, diaphoresis, appetite change, fatigue and unexpected weight change.  HENT: Negative for mouth sores and neck stiffness.   Eyes: Negative for visual disturbance.  Respiratory: Negative for cough, chest tightness, shortness of breath and wheezing.   Cardiovascular: Negative for chest pain.  Gastrointestinal: Positive for nausea,  vomiting, abdominal pain and abdominal distention. Negative for diarrhea and constipation.  Genitourinary: Negative for dysuria, urgency, frequency and hematuria.  Skin: Negative for rash.  Neurological: Negative for syncope, light-headedness and headaches.  Psychiatric/Behavioral: Negative for sleep disturbance. The patient is not nervous/anxious.   All other systems reviewed and are negative.    Allergies  Review of patient's allergies indicates no known allergies.  Home Medications   Current Outpatient Rx  Name  Route  Sig  Dispense  Refill  . HYDROCHLOROTHIAZIDE 25 MG PO TABS   Oral   Take 1 tablet (25 mg total) by mouth daily.   90 tablet   1   . METOPROLOL SUCCINATE ER 100 MG PO TB24   Oral   Take 1 tablet (100 mg total) by mouth daily. Take with or immediately following a meal.   90 tablet   1   . TOPIRAMATE 50 MG PO TABS   Oral   Take 1 tablet (50 mg total) by mouth 2 (two) times daily.   30 tablet   11     BP 122/63  Pulse 89  Temp 99.4 F (37.4 C) (Oral)  Resp 20  SpO2 100%  Physical Exam  Nursing note and vitals reviewed. Constitutional: She is oriented to person, place, and time. She appears well-developed and well-nourished. No distress.  HENT:  Head: Normocephalic and atraumatic.  Mouth/Throat: Oropharynx is clear and moist. No oropharyngeal exudate.  Eyes: Conjunctivae normal are normal. Pupils are equal, round, and reactive to light. No scleral icterus.  Neck: Normal range of motion. Neck supple.  Cardiovascular: Normal rate, regular rhythm, normal heart sounds and intact distal pulses.  Exam reveals no gallop and no friction rub.   No murmur heard. Pulmonary/Chest: Effort normal and breath sounds normal. No respiratory distress. She has no wheezes. She has no rales. She exhibits no tenderness.  Abdominal: Soft. Normal appearance and bowel sounds are normal. She exhibits distension (mild). She exhibits no mass. There is no splenomegaly or  hepatomegaly. There is generalized tenderness (worst in the right lower quadrant). There is rebound. There is no rigidity, no guarding and negative Murphy's sign.  Musculoskeletal: Normal range of motion. She exhibits no edema and no tenderness.  Lymphadenopathy:    She has no cervical adenopathy.  Neurological: She is alert and oriented to person, place, and time. She exhibits normal muscle tone. Coordination normal.       Speech is clear and goal oriented Moves extremities without ataxia  Skin: Skin is warm and dry. No rash noted. She is not diaphoretic.  Psychiatric: She has a normal mood and affect.    ED Course  Procedures (including critical care time)  Labs Reviewed  CBC WITH DIFFERENTIAL - Abnormal; Notable for the following:    WBC 28.5 (*)     Neutrophils Relative 91 (*)     Neutro Abs 25.9 (*)     Lymphocytes Relative 5 (*)     Monocytes Absolute 1.2 (*)     All other components within normal limits  COMPREHENSIVE METABOLIC PANEL - Abnormal; Notable for the following:    Sodium 134 (*)     Potassium 2.3 (*)     Chloride 92 (*)     Glucose, Bld 131 (*)     BUN 26 (*)     Creatinine, Ser 1.31 (*)     GFR calc non Af Amer 45 (*)     GFR calc Af Amer 52 (*)     All other components within normal limits  URINALYSIS, ROUTINE W REFLEX MICROSCOPIC - Abnormal; Notable for the following:    Color, Urine AMBER (*)  BIOCHEMICALS MAY BE AFFECTED BY COLOR   APPearance CLOUDY (*)     Specific Gravity, Urine 1.034 (*)     Hgb urine dipstick SMALL (*)     Bilirubin Urine SMALL (*)     Ketones, ur 15 (*)     Protein, ur >300 (*)     Nitrite POSITIVE (*)     Leukocytes, UA SMALL (*)     All other components within normal limits  URINE MICROSCOPIC-ADD ON - Abnormal; Notable for the following:    Squamous Epithelial / LPF FEW (*)     Bacteria, UA MANY (*)     Casts GRANULAR CAST (*)  HYALINE CASTS   All other components within normal limits  LIPASE, BLOOD  LACTIC ACID, PLASMA    PREGNANCY, URINE  URINE CULTURE   Ct Abdomen Pelvis W Contrast  07/15/2012  *RADIOLOGY REPORT*  Clinical Data: Left lower quadrant abdominal pain, nausea, vomiting  CT ABDOMEN AND PELVIS WITH CONTRAST  Technique:  Multidetector CT imaging of the abdomen and pelvis was performed following the standard protocol during bolus administration of intravenous contrast.  Contrast: OMNIPAQUE IOHEXOL 300 MG/ML  SOLN  Comparison: None  Findings:  Normal hepatic contour.  No discrete hepatic lesions.  Normal appearance of the gallbladder.  No intrahepatic biliary dilatation. No ascites.  There is symmetric enhancement and excretion of the bilateral kidneys.  No renal stones or evidence of urinary obstruction.  No renal lesions.  No perinephric stranding.  Normal appearance of the bilateral adrenal glands, pancreas and spleen.  Incidental note is made of a small splenule.  The appendix is minimally thickened measuring approximately 9 mm in greatest oblique axial dimension (image 63, series 2).  This is associated with a minimal amount of adjacent peri appendiceal stranding (coronal image 39, series five).  The adjacent loops of small bowel with the right lower abdominal quadrant demonstrate a minimal amount of wall thickening and hyperenhancement (image 66, series 2).  No drainable fluid collection.  No pneumoperitoneum, pneumatosis or portal venous gas.  Ingested enteric contrast extends to the proximal small bowel.  No evidence of enteric obstruction. Colonic diverticulosis without definite evidence of diverticulitis.  Normal caliber of the abdominal aorta.  The major branch vessels of the abdominal aorta are patent.  No retroperitoneal, mesenteric, pelvic or inguinal lymphadenopathy.  Normal appearance of the pelvic organs.  Limited visualization of the lower thorax demonstrates right medial basilar atelectasis.  No focal airspace opacities or pleural effusion.  Normal heart size.  No pericardial effusion.  Note  is made of a mild (approximately 25%) compression deformity of the superior endplate of the L1 vertebral body.  This finding without associated discrete fracture or adjacent paraspinal hematoma.  IMPRESSION:  1.  Findings worrisome for acute, early, uncomplicated appendicitis with minimal appendiceal wall thickening, hyperenhancement and periappendiceal stranding.  No evidence of perforation or drainable fluid collection. 2.  Wall thickening and hyperenhancement within several loops of small bowel within the right lower abdominal quadrant favored to be secondarily involved due to the presumed primary appendiceal process.  3.  Mild age indeterminate approximately 25% compression deformity of the L1 vertebral body.  Correlation for point tenderness at this location is recommended.  4.  Colonic diverticulosis without evidence of diverticulitis.   Original Report Authenticated By: Tacey Ruiz, MD     ECG:  Date: 07/15/2012  Rate: 98  Rhythm: normal sinus rhythm  QRS Axis: normal  Intervals: normal  ST/T Wave abnormalities: nonspecific T wave changes  Conduction Disutrbances:none  Narrative Interpretation: T wave flattening in V2-V6  Old EKG Reviewed: none available    1. Pyelonephritis   2. Appendicitis   3. Hypokalemia   4. Nausea and vomiting   5. Abdominal pain       MDM  Mont Dutton presents with abdominal pain nausea and vomiting.  Patient with significant leukocytosis of 28.5 with left shift, mildly elevated kidney function with BUN of 26 a creatinine of 1.31, hypokalemic at 2.3 and mild hyponatremia and hypochloremia.  Hypokalemia likely secondary to HCTZ use, replaced via IV here in the department.  Also the patient will go home with potassium supplements.  Lactic acid unremarkable at 1.1.  Significant concern for appendicitis.  CT scan pending.  Urinalysis with many bacteria, granular casts, hemoglobin, leukocytes and nitrites.  UTI in combination with elevated kidney function  decreased concern for pyelonephritis.  CT scan with early uncomplicated appendicitis along with wall thickening and hyperenhancement of several loops of small bowel likely secondary to appendicitis.   Dr. Janee Morn with [facility] contacted for consult and admission.  Patient started on Levaquin and metronidazole for coverage of both appendicitis and pyelonephritis.  Dr. Gerhard Munch was consulted, evaluated this patient with me and agrees with the plan.          Dahlia Client Milderd Manocchio, PA-C 07/15/12 2049

## 2012-07-15 NOTE — ED Notes (Signed)
Pt presents to department from [facility] for evaluation of diffuse abdominal pain and N/V. Sudden onset yesterday morning. 8/10 pain at the time. Denies urinary symptoms. She is conscious alert and oriented x4.

## 2012-07-15 NOTE — ED Notes (Signed)
Pt finished drinking contrast. CT notified. 

## 2012-07-15 NOTE — Op Note (Signed)
07/15/2012  11:42 PM  PATIENT:  Jennifer [name]  56 y.o. female  PRE-OPERATIVE DIAGNOSIS:  Acute Appendicitis  POST-OPERATIVE DIAGNOSIS: Perforated Appendicitis  PROCEDURE:  Procedure(s): APPENDECTOMY LAPAROSCOPIC  SURGEON:  Surgeon(s): Liz Malady, MD  PHYSICIAN ASSISTANT:   ASSISTANTS: none   ANESTHESIA:   local and general  EBL:  Total I/O In: 1000 [I.V.:1000] Out: -   BLOOD ADMINISTERED:none  DRAINS: (1) Jackson-Pratt drain(s) with closed bulb suction in the rlq   SPECIMEN:  Excision  DISPOSITION OF SPECIMEN:  PATHOLOGY  COUNTS:  YES  DICTATION: Reubin Milan Dictation  Patient presented to the ED with abdominal pain. Workup demonstrated urinary tract infection and acute appendicitis. She also had hypokalemia. She was given potassium. She was given intravenous antibiotics. She is brought to the operating room for laparoscopic appendectomy. Informed consent was obtained. Patient was identified in the preop holding area. She was brought to the operating room and general endotracheal anesthesia was administered by the anesthesia staff. Abdomen was prepped and draped in sterile fashion after Foley catheter was placed by nursing. Time out procedure was done.InfraumbilicalArea was infiltrated with quarter percent Marcaine with epinephrine. An umbilical incision was made. Subcutaneous tissues were dissected down revealing the anterior fascia. This was divided along the midline. Peritoneal cavity was entered under direct vision. 0 Vicryl pursestring suture was placed on the fascial opening. Hassan trocar was inserted into the abdomen. Abdomen was insufflated with carbon monoxide in standard fashion. Under direct vision, a 12 mm left lower quadrant and a 5 mm mid abdomen port were placed. Local was used at these port sites also. Laparoscopic exploration revealed an acute ruptured appendicitis with purulent drainage in the right lower quadrant. There were several small bowel loops stuck  down in the area. These were easily teased away exposing the appendix. The base was intact. The mesoappendix was divided with the harmonic scalpel achieving excellent hemostasis. The base was then divided with Endo GIA stapler with vascular load. There was excellent staple line. Appendix was placed in Endo Catch bag and removed from the abdomen via the left lower quadrant port site. Abdomen was irrigated with multiple liters of saline. Irrigation fluid returned clear. Staple line remained intact. There was no bleeding. 19 [place] Blake drain was placed in the right lower quadrant and brought out through the right-sided port site. It was secured with nylon suture. Other ports were removed and pneumoperitoneum was released. Informed local fascia was closed by tying the Vicryl pursestring suture. Both remaining wounds were copiously irrigated and the skin of each was closed with running 4 Vicryl followed by Dermabond. All counts were correct. Patient tolerated procedure well without apparent complications to recovery in stable condition.  PATIENT DISPOSITION:  PACU - hemodynamically stable.   Delay start of Pharmacological VTE agent (>24hrs) due to surgical blood loss or risk of bleeding:  no  Violeta Gelinas, MD, MPH, FACS Pager: [phone]  11/21/201311:42 PM
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
## 2012-07-15 NOTE — ED Notes (Signed)
Patient transported to CT 

## 2012-07-15 NOTE — Anesthesia Preprocedure Evaluation (Addendum)
Anesthesia Evaluation  Patient identified by MRN, date of birth, ID band Patient awake    Reviewed: Allergy & Precautions, H&P , NPO status , Patient's Chart, lab work & pertinent test results  Airway Mallampati: I TM Distance: >3 FB Neck ROM: Full    Dental   Pulmonary  breath sounds clear to auscultation        Cardiovascular hypertension, Pt. on medications Rhythm:Regular Rate:Normal  k 2.3   Neuro/Psych  Headaches,    GI/Hepatic Acute appendicitis   Endo/Other    Renal/GU Renal InsufficiencyRenal disease   UTI    Musculoskeletal   Abdominal (+)  Abdomen: tender.    Peds  Hematology   Anesthesia Other Findings   Reproductive/Obstetrics                          Anesthesia Physical Anesthesia Plan  ASA: II and emergent  Anesthesia Plan: General   Post-op Pain Management:    Induction: Intravenous, Rapid sequence and Cricoid pressure planned  Airway Management Planned: Oral ETT  Additional Equipment:   Intra-op Plan:   Post-operative Plan: Extubation in OR  Informed Consent: I have reviewed the patients History and Physical, chart, labs and discussed the procedure including the risks, benefits and alternatives for the proposed anesthesia with the patient or authorized representative who has indicated his/her understanding and acceptance.     Plan Discussed with: CRNA and Surgeon  Anesthesia Plan Comments:         Anesthesia Quick Evaluation

## 2012-07-16 ENCOUNTER — Encounter (HOSPITAL_COMMUNITY): Payer: Self-pay | Admitting: *Deleted

## 2012-07-16 LAB — BASIC METABOLIC PANEL
BUN: 15 mg/dL (ref 6–23)
CO2: 23 mEq/L (ref 19–32)
Calcium: 8.7 mg/dL (ref 8.4–10.5)
Chloride: 101 mEq/L (ref 96–112)
Creatinine, Ser: 0.97 mg/dL (ref 0.50–1.10)
GFR calc Af Amer: 75 mL/min — ABNORMAL LOW (ref >90)
GFR calc non Af Amer: 65 mL/min — ABNORMAL LOW (ref >90)
Glucose, Bld: 195 mg/dL — ABNORMAL HIGH (ref 70–99)
Potassium: 2.8 mEq/L — ABNORMAL LOW (ref 3.5–5.1)
Sodium: 135 mEq/L (ref 135–145)

## 2012-07-16 LAB — URINE CULTURE: Colony Count: 50000

## 2012-07-16 LAB — CBC
HCT: 31.8 % — ABNORMAL LOW (ref 36.0–46.0)
Hemoglobin: 11.4 g/dL — ABNORMAL LOW (ref 12.0–15.0)
MCH: 32.8 pg (ref 26.0–34.0)
MCHC: 35.8 g/dL (ref 30.0–36.0)
MCV: 91.4 fL (ref 78.0–100.0)
Platelets: 242 10*3/uL (ref 150–400)
RBC: 3.48 MIL/uL — ABNORMAL LOW (ref 3.87–5.11)
RDW: 12.8 % (ref 11.5–15.5)
WBC: 16.9 10*3/uL — ABNORMAL HIGH (ref 4.0–10.5)

## 2012-07-16 MED ORDER — CIPROFLOXACIN IN D5W 400 MG/200ML IV SOLN
400.0000 mg | Freq: Two times a day (BID) | INTRAVENOUS | Status: DC
  Administered 2012-07-16 (×2): 400 mg via INTRAVENOUS
  Filled 2012-07-16 (×5): qty 200

## 2012-07-16 MED ORDER — HEPARIN SODIUM (PORCINE) 5000 UNIT/ML IJ SOLN
5000.0000 [IU] | Freq: Three times a day (TID) | INTRAMUSCULAR | Status: DC
  Filled 2012-07-16 (×6): qty 1

## 2012-07-16 MED ORDER — FENTANYL CITRATE 0.05 MG/ML IJ SOLN: 50.0000 ug | Freq: Once | INTRAMUSCULAR | Status: DC

## 2012-07-16 MED ORDER — CALCIUM CARBONATE ANTACID 500 MG PO CHEW
1.0000 | CHEWABLE_TABLET | Freq: Four times a day (QID) | ORAL | Status: DC | PRN
  Administered 2012-07-16: 200 mg via ORAL
  Filled 2012-07-16: qty 1

## 2012-07-16 MED ORDER — WHITE PETROLATUM GEL
Status: AC
  Administered 2012-07-16: 03:00:00
  Filled 2012-07-16: qty 5

## 2012-07-16 MED ORDER — FAMOTIDINE 20 MG PO TABS
20.0000 mg | ORAL_TABLET | Freq: Two times a day (BID) | ORAL | Status: DC
  Administered 2012-07-16 (×2): 20 mg via ORAL
  Filled 2012-07-16 (×4): qty 1

## 2012-07-16 MED ORDER — ACETAMINOPHEN 325 MG PO TABS: 650.0000 mg | ORAL_TABLET | ORAL | Status: DC | PRN

## 2012-07-16 MED ORDER — MIDAZOLAM HCL 2 MG/2ML IJ SOLN: 1.0000 mg | INTRAMUSCULAR | Status: DC | PRN

## 2012-07-16 MED ORDER — TOPIRAMATE 25 MG PO TABS
50.0000 mg | ORAL_TABLET | Freq: Every day | ORAL | Status: DC
  Administered 2012-07-16 (×2): 50 mg via ORAL
  Filled 2012-07-16 (×3): qty 2

## 2012-07-16 MED ORDER — ONDANSETRON HCL 4 MG PO TABS: 4.0000 mg | ORAL_TABLET | Freq: Four times a day (QID) | ORAL | Status: DC | PRN

## 2012-07-16 MED ORDER — POTASSIUM CHLORIDE 10 MEQ/100ML IV SOLN
10.0000 meq | INTRAVENOUS | Status: AC
  Administered 2012-07-16 (×3): 10 meq via INTRAVENOUS
  Filled 2012-07-16 (×3): qty 100

## 2012-07-16 MED ORDER — METOPROLOL SUCCINATE ER 100 MG PO TB24
100.0000 mg | ORAL_TABLET | Freq: Every day | ORAL | Status: DC
  Administered 2012-07-16: 100 mg via ORAL
  Filled 2012-07-16 (×2): qty 1

## 2012-07-16 MED ORDER — OXYCODONE HCL 5 MG/5ML PO SOLN: 5.0000 mg | Freq: Once | ORAL | Status: DC | PRN

## 2012-07-16 MED ORDER — PROMETHAZINE HCL 25 MG/ML IJ SOLN: 6.2500 mg | INTRAMUSCULAR | Status: DC | PRN

## 2012-07-16 MED ORDER — KCL IN DEXTROSE-NACL 20-5-0.45 MEQ/L-%-% IV SOLN
INTRAVENOUS | Status: DC
  Administered 2012-07-16: 02:00:00 via INTRAVENOUS
  Filled 2012-07-16 (×3): qty 1000

## 2012-07-16 MED ORDER — OXYCODONE HCL 5 MG PO TABS: 5.0000 mg | ORAL_TABLET | Freq: Once | ORAL | Status: DC | PRN

## 2012-07-16 MED ORDER — MORPHINE SULFATE 2 MG/ML IJ SOLN: 2.0000 mg | INTRAMUSCULAR | Status: DC | PRN

## 2012-07-16 MED ORDER — OXYCODONE-ACETAMINOPHEN 5-325 MG PO TABS
1.0000 | ORAL_TABLET | ORAL | Status: DC | PRN
  Administered 2012-07-16 – 2012-07-17 (×2): 2 via ORAL
  Filled 2012-07-16 (×2): qty 2

## 2012-07-16 MED ORDER — HYDROCHLOROTHIAZIDE 25 MG PO TABS
25.0000 mg | ORAL_TABLET | Freq: Every day | ORAL | Status: DC
  Administered 2012-07-16: 25 mg via ORAL
  Filled 2012-07-16 (×2): qty 1

## 2012-07-16 MED ORDER — ONDANSETRON HCL 4 MG/2ML IJ SOLN: 4.0000 mg | Freq: Four times a day (QID) | INTRAMUSCULAR | Status: DC | PRN

## 2012-07-16 MED ORDER — HYDROMORPHONE HCL PF 1 MG/ML IJ SOLN: 0.2500 mg | INTRAMUSCULAR | Status: DC | PRN

## 2012-07-16 MED ORDER — POTASSIUM CHLORIDE IN NACL 40-0.9 MEQ/L-% IV SOLN
INTRAVENOUS | Status: DC
  Administered 2012-07-16: 125 mL/h via INTRAVENOUS
  Administered 2012-07-16: 12:00:00 via INTRAVENOUS
  Administered 2012-07-17: 125 mL/h via INTRAVENOUS
  Filled 2012-07-16 (×5): qty 1000

## 2012-07-16 MED ORDER — METRONIDAZOLE IN NACL 5-0.79 MG/ML-% IV SOLN
500.0000 mg | Freq: Four times a day (QID) | INTRAVENOUS | Status: DC
  Administered 2012-07-16 – 2012-07-17 (×5): 500 mg via INTRAVENOUS
  Filled 2012-07-16 (×8): qty 100

## 2012-07-16 NOTE — Anesthesia Postprocedure Evaluation (Signed)
  Anesthesia Post-op Note  Patient: Jennifer [name]  Procedure(s) Performed: Procedure(s) (LRB) with comments: APPENDECTOMY LAPAROSCOPIC (N/A) - Laparoscopic Appendectomy  Patient Location: PACU  Anesthesia Type:General  Level of Consciousness: awake  Airway and Oxygen Therapy: Patient Spontanous Breathing  Post-op Pain: mild  Post-op Assessment: Post-op Vital signs reviewed, Patient's Cardiovascular Status Stable, Respiratory Function Stable, Patent Airway, No signs of Nausea or vomiting and Pain level controlled  Post-op Vital Signs: stable  Complications: No apparent anesthesia complications

## 2012-07-16 NOTE — Progress Notes (Signed)
1 Day Post-Op  Subjective: Doing pretty well, doesn't like IV potasium.  No nausea with clear liquids, Objective: Vital signs in last 24 hours: Temp:  [98.7 F (37.1 C)-99.4 F (37.4 C)] 99.1 F (37.3 C) (11/22 0614) Pulse Rate:  [79-108] 79  (11/22 0614) Resp:  [14-21] 18  (11/22 0614) BP: (112-144)/(50-73) 118/62 mmHg (11/22 0614) SpO2:  [94 %-100 %] 100 % (11/22 0614) Weight:  [160 lb (72.576 kg)] 160 lb (72.576 kg) (11/21 2242)   Diet: clears,  Temp still in the 99 range. K+2.8 WBC down to 16.9, glucose 195  Intake/Output from previous day: 11/21 0701 - 11/22 0700 In: 1992 [I.V.:1592; IV Piggyback:400] Out: 1610 [Urine:1450; Drains:160] Intake/Output this shift:    General appearance: alert, cooperative and no distress GI: soft, not abnormally tender, tolerating clears, incisions look good, BS diminished. Drainage from JP is serous, but cloudy.  Lab Results:   Basename 07/16/12 0603 07/15/12 2244 07/15/12 1348  WBC 16.9* -- 28.5*  HGB 11.4* 11.9* --  HCT 31.8* 35.0* --  PLT 242 -- 337    BMET  Basename 07/16/12 0603 07/15/12 2244 07/15/12 1348  NA 135 136 --  K 2.8* 2.6* --  CL 101 -- 92*  CO2 23 -- 28  GLUCOSE 195* 122* --  BUN 15 -- 26*  CREATININE 0.97 -- 1.31*  CALCIUM 8.7 -- 9.8   PT/INR No results found for this basename: LABPROT:2,INR:2 in the last 72 hours   Lab 07/15/12 1348  AST 15  ALT 10  ALKPHOS 69  BILITOT 1.1  PROT 8.2  ALBUMIN 3.9     Lipase     Component Value Date/Time   LIPASE 12 07/15/2012 1348     Studies/Results: Ct Abdomen Pelvis W Contrast  07/15/2012  *RADIOLOGY REPORT*  Clinical Data: Left lower quadrant abdominal pain, nausea, vomiting  CT ABDOMEN AND PELVIS WITH CONTRAST  Technique:  Multidetector CT imaging of the abdomen and pelvis was performed following the standard protocol during bolus administration of intravenous contrast.  Contrast: OMNIPAQUE IOHEXOL 300 MG/ML  SOLN  Comparison: None  Findings:   Normal hepatic contour.  No discrete hepatic lesions.  Normal appearance of the gallbladder.  No intrahepatic biliary dilatation. No ascites.  There is symmetric enhancement and excretion of the bilateral kidneys.  No renal stones or evidence of urinary obstruction.  No renal lesions.  No perinephric stranding.  Normal appearance of the bilateral adrenal glands, pancreas and spleen.  Incidental note is made of a small splenule.  The appendix is minimally thickened measuring approximately 9 mm in greatest oblique axial dimension (image 63, series 2).  This is associated with a minimal amount of adjacent peri appendiceal stranding (coronal image 39, series five).  The adjacent loops of small bowel with the right lower abdominal quadrant demonstrate a minimal amount of wall thickening and hyperenhancement (image 66, series 2).  No drainable fluid collection.  No pneumoperitoneum, pneumatosis or portal venous gas.  Ingested enteric contrast extends to the proximal small bowel.  No evidence of enteric obstruction. Colonic diverticulosis without definite evidence of diverticulitis.  Normal caliber of the abdominal aorta.  The major branch vessels of the abdominal aorta are patent.  No retroperitoneal, mesenteric, pelvic or inguinal lymphadenopathy.  Normal appearance of the pelvic organs.  Limited visualization of the lower thorax demonstrates right medial basilar atelectasis.  No focal airspace opacities or pleural effusion.  Normal heart size.  No pericardial effusion.  Note is made of a mild (approximately 25%)  compression deformity of the superior endplate of the L1 vertebral body.  This finding without associated discrete fracture or adjacent paraspinal hematoma.  IMPRESSION:  1.  Findings worrisome for acute, early, uncomplicated appendicitis with minimal appendiceal wall thickening, hyperenhancement and periappendiceal stranding.  No evidence of perforation or drainable fluid collection. 2.  Wall thickening and  hyperenhancement within several loops of small bowel within the right lower abdominal quadrant favored to be secondarily involved due to the presumed primary appendiceal process.  3.  Mild age indeterminate approximately 25% compression deformity of the L1 vertebral body.  Correlation for point tenderness at this location is recommended.  4.  Colonic diverticulosis without evidence of diverticulitis.   Original Report Authenticated By: Tacey Ruiz, MD     Medications:    . [COMPLETED] ciprofloxacin  400 mg Intravenous Once  . ciprofloxacin  400 mg Intravenous Q12H  . heparin  5,000 Units Subcutaneous Q8H  . hydrochlorothiazide  25 mg Oral Daily  . [EXPIRED] iohexol  20 mL Oral Q1 Hr x 2  . metoprolol succinate  100 mg Oral Daily  . [COMPLETED] metronidazole  500 mg Intravenous Once  . metronidazole  500 mg Intravenous Q6H  . [COMPLETED] morphine  4 mg Intravenous Once  . [COMPLETED] ondansetron (ZOFRAN) IV  4 mg Intravenous Once  . [COMPLETED] potassium chloride  10 mEq Intravenous Q1 Hr x 2  . [COMPLETED] potassium chloride  10 mEq Intravenous Q1 Hr x 3  . [COMPLETED] sodium chloride  1,000 mL Intravenous Once  . topiramate  50 mg Oral QHS  . [COMPLETED] white petrolatum      . [DISCONTINUED] fentaNYL  50-100 mcg Intravenous Once  . [DISCONTINUED] fentaNYL  50-100 mcg Intravenous Once  . [DISCONTINUED] fentaNYL  50-100 mcg Intravenous Once  . [DISCONTINUED] levofloxacin (LEVAQUIN) IV  750 mg Intravenous Once  . [DISCONTINUED] potassium chloride  10 mEq Intravenous Q1 Hr x 2  . [DISCONTINUED] potassium chloride  10 mEq Intravenous Q1 Hr x 2  . [DISCONTINUED] potassium chloride  10 mEq Intravenous Q1 Hr x 2   Prior to Admission medications   Medication Sig Start Date End Date Taking? Authorizing Provider  hydrochlorothiazide (HYDRODIURIL) 25 MG tablet Take 1 tablet (25 mg total) by mouth daily. 04/16/12 04/16/13 Yes Maurice March, MD  metoprolol succinate (TOPROL-XL) 100 MG 24 hr  tablet Take 1 tablet (100 mg total) by mouth daily. Take with or immediately following a meal. 04/16/12 04/16/13 Yes Maurice March, MD  topiramate (TOPAMAX) 50 MG tablet Take 1 tablet (50 mg total) by mouth 2 (two) times daily. 09/24/11 09/23/12 Yes Elvina Sidle, MD      . 0.9 % NaCl with KCl 40 mEq / L    . [DISCONTINUED] dextrose 5 % and 0.45 % NaCl with KCl 20 mEq/L 125 mL/hr at 07/16/12 0865    Assessment/Plan Perforated Appendicitis s/p APPENDECTOMY LAPAROSCOPIC, 07/15/2012, Jennifer Malady, MD Probable UTI Hypertension  Migraine Hypokalemia she has had 60 meq IV KCL  Plan:  Continue antibiotics, IV fluids, clears PO, mobilize.  Recheck labs in AM.  See how she does overnight.          LOS: 1 day    Jennifer [name] 07/16/2012

## 2012-07-16 NOTE — Transfer of Care (Signed)
Immediate Anesthesia Transfer of Care Note  Patient: Jennifer [name]  Procedure(s) Performed: Procedure(s) (LRB) with comments: APPENDECTOMY LAPAROSCOPIC (N/A) - Laparoscopic Appendectomy  Patient Location: PACU  Anesthesia Type:General  Level of Consciousness: awake, alert , oriented and patient cooperative  Airway & Oxygen Therapy: Patient Spontanous Breathing and Patient connected to nasal cannula oxygen  Post-op Assessment: Report given to PACU RN, Post -op Vital signs reviewed and stable and Patient moving all extremities X 4  Post vital signs: Reviewed and stable  Complications: No apparent anesthesia complications

## 2012-07-16 NOTE — Progress Notes (Signed)
Gauze at JP site dry and intact

## 2012-07-17 NOTE — Discharge Summary (Signed)
Physician Discharge Summary  Patient ID:  Jennifer [name]  MRN: [id number]  DOB/AGE: [birth year] 56 y.o.  Admit date: 07/15/2012 Discharge date: 07/17/2012  Discharge Diagnoses:   1.  Acute perforated appendicitis  2.  Hypertension  3.  Hypokalemia She is to follow up with Drs. Lauenstein/McPherson regarding this.  4.  History of migraine headaches post AA  Operation: Procedure(s): APPENDECTOMY LAPAROSCOPIC on 07/15/2012 - 07/16/2012 - Dr. Leonard Schwartz. Janee Morn.  Discharged Condition: good  Hospital Course: Jennifer [name] is an 56 y.o. female whose primary care physician is Dr. Mosie Epstein and Dr. Dory Horn and who was admitted 07/15/2012 with a  diagnosis of appendicitis. She was brought to the operating room on 07/15/2012 - 07/16/2012 and underwent  Lap appendectomy.  She is now 2 days post op, has done well, and is ready for discharge.   Consults: None  Significant Diagnostic Studies: Results for orders placed during the hospital encounter of 07/15/12  CBC WITH DIFFERENTIAL      Component Value Range   WBC 28.5 (*) 4.0 - 10.5 K/uL   RBC 4.20  3.87 - 5.11 MIL/uL   Hemoglobin 13.7  12.0 - 15.0 g/dL   HCT 40.9  81.1 - 91.4 %   MCV 91.2  78.0 - 100.0 fL   MCH 32.6  26.0 - 34.0 pg   MCHC 35.8  30.0 - 36.0 g/dL   RDW 78.2  95.6 - 21.3 %   Platelets 337  150 - 400 K/uL   Neutrophils Relative 91 (*) 43 - 77 %   Neutro Abs 25.9 (*) 1.7 - 7.7 K/uL   Lymphocytes Relative 5 (*) 12 - 46 %   Lymphs Abs 1.3  0.7 - 4.0 K/uL   Monocytes Relative 4  3 - 12 %   Monocytes Absolute 1.2 (*) 0.1 - 1.0 K/uL   Eosinophils Relative 0  0 - 5 %   Eosinophils Absolute 0.0  0.0 - 0.7 K/uL   Basophils Relative 0  0 - 1 %   Basophils Absolute 0.0  0.0 - 0.1 K/uL  COMPREHENSIVE METABOLIC PANEL      Component Value Range   Sodium 134 (*) 135 - 145 mEq/L   Potassium 2.3 (*) 3.5 - 5.1 mEq/L   Chloride 92 (*) 96 - 112 mEq/L   CO2 28  19 - 32 mEq/L   Glucose, Bld 131 (*) 70 - 99 mg/dL   BUN 26 (*) 6 -  23 mg/dL   Creatinine, Ser 0.86 (*) 0.50 - 1.10 mg/dL   Calcium 9.8  8.4 - 57.8 mg/dL   Total Protein 8.2  6.0 - 8.3 g/dL   Albumin 3.9  3.5 - 5.2 g/dL   AST 15  0 - 37 U/L   ALT 10  0 - 35 U/L   Alkaline Phosphatase 69  39 - 117 U/L   Total Bilirubin 1.1  0.3 - 1.2 mg/dL   GFR calc non Af Amer 45 (*) >90 mL/min   GFR calc Af Amer 52 (*) >90 mL/min  LIPASE, BLOOD      Component Value Range   Lipase 12  11 - 59 U/L  URINALYSIS, ROUTINE W REFLEX MICROSCOPIC      Component Value Range   Color, Urine AMBER (*) YELLOW   APPearance CLOUDY (*) CLEAR   Specific Gravity, Urine 1.034 (*) 1.005 - 1.030   pH 5.5  5.0 - 8.0   Glucose, UA NEGATIVE  NEGATIVE mg/dL   Hgb urine dipstick SMALL (*)  NEGATIVE   Bilirubin Urine SMALL (*) NEGATIVE   Ketones, ur 15 (*) NEGATIVE mg/dL   Protein, ur >086 (*) NEGATIVE mg/dL   Urobilinogen, UA 1.0  0.0 - 1.0 mg/dL   Nitrite POSITIVE (*) NEGATIVE   Leukocytes, UA SMALL (*) NEGATIVE  LACTIC ACID, PLASMA      Component Value Range   Lactic Acid, Venous 1.1  0.5 - 2.2 mmol/L  URINE MICROSCOPIC-ADD ON      Component Value Range   Squamous Epithelial / LPF FEW (*) RARE   WBC, UA 3-6  <3 WBC/hpf   RBC / HPF 0-2  <3 RBC/hpf   Bacteria, UA MANY (*) RARE   Casts GRANULAR CAST (*) NEGATIVE   Urine-Other MUCOUS PRESENT    URINE CULTURE      Component Value Range   Specimen Description URINE, CLEAN CATCH     Special Requests NONE     Culture  Setup Time 07/15/2012 19:08     Colony Count 50,000 COLONIES/ML     Culture       Value: Multiple bacterial morphotypes present, none predominant. Suggest appropriate recollection if clinically indicated.   Report Status 07/16/2012 FINAL    PREGNANCY, URINE      Component Value Range   Preg Test, Ur NEGATIVE  NEGATIVE  POCT I-STAT 4, (NA,K, GLUC, HGB,HCT)      Component Value Range   Sodium 136  135 - 145 mEq/L   Potassium 2.6 (*) 3.5 - 5.1 mEq/L   Glucose, Bld 122 (*) 70 - 99 mg/dL   HCT 57.8 (*) 46.9 - 62.9 %     Hemoglobin 11.9 (*) 12.0 - 15.0 g/dL  BASIC METABOLIC PANEL      Component Value Range   Sodium 135  135 - 145 mEq/L   Potassium 2.8 (*) 3.5 - 5.1 mEq/L   Chloride 101  96 - 112 mEq/L   CO2 23  19 - 32 mEq/L   Glucose, Bld 195 (*) 70 - 99 mg/dL   BUN 15  6 - 23 mg/dL   Creatinine, Ser 5.28  0.50 - 1.10 mg/dL   Calcium 8.7  8.4 - 41.3 mg/dL   GFR calc non Af Amer 65 (*) >90 mL/min   GFR calc Af Amer 75 (*) >90 mL/min  CBC      Component Value Range   WBC 16.9 (*) 4.0 - 10.5 K/uL   RBC 3.48 (*) 3.87 - 5.11 MIL/uL   Hemoglobin 11.4 (*) 12.0 - 15.0 g/dL   HCT 24.4 (*) 01.0 - 27.2 %   MCV 91.4  78.0 - 100.0 fL   MCH 32.8  26.0 - 34.0 pg   MCHC 35.8  30.0 - 36.0 g/dL   RDW 53.6  64.4 - 03.4 %   Platelets 242  150 - 400 K/uL    Ct Abdomen Pelvis W Contrast  07/15/2012  *RADIOLOGY REPORT*  Clinical Data: Left lower quadrant abdominal pain, nausea, vomiting  CT ABDOMEN AND PELVIS WITH CONTRAST  Technique:  Multidetector CT imaging of the abdomen and pelvis was performed following the standard protocol during bolus administration of intravenous contrast.  Contrast: OMNIPAQUE IOHEXOL 300 MG/ML  SOLN  Comparison: None  Findings:  Normal hepatic contour.  No discrete hepatic lesions.  Normal appearance of the gallbladder.  No intrahepatic biliary dilatation. No ascites.  There is symmetric enhancement and excretion of the bilateral kidneys.  No renal stones or evidence of urinary obstruction.  No renal lesions.  No perinephric  stranding.  Normal appearance of the bilateral adrenal glands, pancreas and spleen.  Incidental note is made of a small splenule.  The appendix is minimally thickened measuring approximately 9 mm in greatest oblique axial dimension (image 63, series 2).  This is associated with a minimal amount of adjacent peri appendiceal stranding (coronal image 39, series five).  The adjacent loops of small bowel with the right lower abdominal quadrant demonstrate a minimal amount of  wall thickening and hyperenhancement (image 66, series 2).  No drainable fluid collection.  No pneumoperitoneum, pneumatosis or portal venous gas.  Ingested enteric contrast extends to the proximal small bowel.  No evidence of enteric obstruction. Colonic diverticulosis without definite evidence of diverticulitis.  Normal caliber of the abdominal aorta.  The major branch vessels of the abdominal aorta are patent.  No retroperitoneal, mesenteric, pelvic or inguinal lymphadenopathy.  Normal appearance of the pelvic organs.  Limited visualization of the lower thorax demonstrates right medial basilar atelectasis.  No focal airspace opacities or pleural effusion.  Normal heart size.  No pericardial effusion.  Note is made of a mild (approximately 25%) compression deformity of the superior endplate of the L1 vertebral body.  This finding without associated discrete fracture or adjacent paraspinal hematoma.  IMPRESSION:  1.  Findings worrisome for acute, early, uncomplicated appendicitis with minimal appendiceal wall thickening, hyperenhancement and periappendiceal stranding.  No evidence of perforation or drainable fluid collection. 2.  Wall thickening and hyperenhancement within several loops of small bowel within the right lower abdominal quadrant favored to be secondarily involved due to the presumed primary appendiceal process.  3.  Mild age indeterminate approximately 25% compression deformity of the L1 vertebral body.  Correlation for point tenderness at this location is recommended.  4.  Colonic diverticulosis without evidence of diverticulitis.   Original Report Authenticated By: Tacey Ruiz, MD     Discharge Exam:  Filed Vitals:   07/17/12 0616  BP: 125/70  Pulse: 85  Temp: 98.2 F (36.8 C)  Resp: 18    General: WN older WF who is alert and generally healthy appearing.  Lungs: Clear to auscultation and symmetric breath sounds. Heart:  RRR. No murmur or rub. Abdomen: Incisions look good.  RLQ  drain was removed today.  Discharge Medications:     Medication List     As of 07/17/2012 10:37 AM    TAKE these medications         hydrochlorothiazide 25 MG tablet   Commonly known as: HYDRODIURIL   Take 1 tablet (25 mg total) by mouth daily.      metoprolol succinate 100 MG 24 hr tablet   Commonly known as: TOPROL-XL   Take 1 tablet (100 mg total) by mouth daily. Take with or immediately following a meal.      topiramate 50 MG tablet   Commonly known as: TOPAMAX   Take 1 tablet (50 mg total) by mouth 2 (two) times daily.        Disposition: Final discharge disposition not confirmed      Discharge Orders    Future Orders Please Complete By Expires   Diet - low sodium heart healthy      Increase activity slowly        Return to work on:  02 Aug 2012  Activity:  Driving - May drive in 3 or 4 days, if doing well.   Lifting - No lifting > 15 pounds for 7 days, then no limit  Wound Care:   May  shower starting tomorrow. Diet:  As tolerated  Follow up appointment:  Call Dr. Carollee Massed office [facility]) at [phone] for an appointment in 1 to 2 weeks. Medications and dosages:  Resume your home medications.  You have a prescription for:  Vicodin for pain                                                    Cipro and Flagyl as antibiotics for 5 more days, then stop.  Signed: Ovidio Kin, M.D., FACS  07/17/2012, 10:37 AM

## 2012-07-17 NOTE — Progress Notes (Signed)
General Surgery Note  LOS: 2 days  POD -  2 Days Post-Op  Assessment/Plan: 1.  APPENDECTOMY LAPAROSCOPIC for perf appendicitis - Jennifer [name] - 07/15/2012  On cipro and flagyl  Drain has only 55 cc of serous fluid - will pull drain  Has taken regular food  Ready for discharge.  2.  DVT proph - SQ Heparin 3.  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  I talked to her about getting in touch with Drs. Lauenstein/McPherson when she is discharged for follow up 4.  Hypertension 5.  History of migraine HA  Post AA.  Subjective:  Doing well.  Ate regular diet.  No BM yet, but feels good enough to go home. Objective:   Filed Vitals:   07/17/12 0616  BP: 125/70  Pulse: 85  Temp: 98.2 F (36.8 C)  Resp: 18     Intake/Output from previous day:  11/22 0701 - 11/23 0700 In: 3642.5 [P.O.:480; I.V.:2862.5; IV Piggyback:300] Out: 1755 [Urine:1700; Drains:55]  Intake/Output this shift:      Physical Exam:   General: WN older WF who is alert and oriented.    HEENT: Normal. Pupils equal. .   Lungs: Clear   Abdomen: Mild distention.  BS present.  Drain in RLQ with serous fluid.   Wound: Clean   Neurologic:  Grossly intact to motor and sensory function.   Psychiatric: Has normal mood and affect.   Lab Results:    Basename 07/16/12 0603 07/15/12 2244 07/15/12 1348  WBC 16.9* -- 28.5*  HGB 11.4* 11.9* --  HCT 31.8* 35.0* --  PLT 242 -- 337    BMET   Basename 07/16/12 0603 07/15/12 2244 07/15/12 1348  NA 135 136 --  K 2.8* 2.6* --  CL 101 -- 92*  CO2 23 -- 28  GLUCOSE 195* 122* --  BUN 15 -- 26*  CREATININE 0.97 -- 1.31*  CALCIUM 8.7 -- 9.8    PT/INR  No results found for this basename: LABPROT:2,INR:2 in the last 72 hours  ABG  No results found for this basename: PHART:2,PCO2:2,PO2:2,HCO3:2 in the last 72 hours   Studies/Results:  Ct Abdomen Pelvis W Contrast  07/15/2012  *RADIOLOGY REPORT*  Clinical Data: Left lower quadrant  abdominal pain, nausea, vomiting  CT ABDOMEN AND PELVIS WITH CONTRAST  Technique:  Multidetector CT imaging of the abdomen and pelvis was performed following the standard protocol during bolus administration of intravenous contrast.  Contrast: OMNIPAQUE IOHEXOL 300 MG/ML  SOLN  Comparison: None  Findings:  Normal hepatic contour.  No discrete hepatic lesions.  Normal appearance of the gallbladder.  No intrahepatic biliary dilatation. No ascites.  There is symmetric enhancement and excretion of the bilateral kidneys.  No renal stones or evidence of urinary obstruction.  No renal lesions.  No perinephric stranding.  Normal appearance of the bilateral adrenal glands, pancreas and spleen.  Incidental note is made of a small splenule.  The appendix is minimally thickened measuring approximately 9 mm in greatest oblique axial dimension (image 63, series 2).  This is associated with a minimal amount of adjacent peri appendiceal stranding (coronal image 39, series five).  The adjacent loops of small bowel with the right lower abdominal quadrant demonstrate a minimal amount of wall thickening and hyperenhancement (image 66, series 2).  No drainable fluid collection.  No pneumoperitoneum, pneumatosis or portal venous gas.  Ingested enteric contrast extends to the proximal small bowel.  No evidence of enteric obstruction.  Colonic diverticulosis without definite evidence of diverticulitis.  Normal caliber of the abdominal aorta.  The major branch vessels of the abdominal aorta are patent.  No retroperitoneal, mesenteric, pelvic or inguinal lymphadenopathy.  Normal appearance of the pelvic organs.  Limited visualization of the lower thorax demonstrates right medial basilar atelectasis.  No focal airspace opacities or pleural effusion.  Normal heart size.  No pericardial effusion.  Note is made of a mild (approximately 25%) compression deformity of the superior endplate of the L1 vertebral body.  This finding without  associated discrete fracture or adjacent paraspinal hematoma.  IMPRESSION:  1.  Findings worrisome for acute, early, uncomplicated appendicitis with minimal appendiceal wall thickening, hyperenhancement and periappendiceal stranding.  No evidence of perforation or drainable fluid collection. 2.  Wall thickening and hyperenhancement within several loops of small bowel within the right lower abdominal quadrant favored to be secondarily involved due to the presumed primary appendiceal process.  3.  Mild age indeterminate approximately 25% compression deformity of the L1 vertebral body.  Correlation for point tenderness at this location is recommended.  4.  Colonic diverticulosis without evidence of diverticulitis.   Original Report Authenticated By: Tacey Ruiz, MD      Anti-infectives:   Anti-infectives     Start     Dose/Rate Route Frequency Ordered Stop   07/16/12 0200   ciprofloxacin (CIPRO) IVPB 400 mg        400 mg 200 mL/hr over 60 Minutes Intravenous Every 12 hours 07/16/12 0152     07/16/12 0200   metroNIDAZOLE (FLAGYL) IVPB 500 mg        500 mg 100 mL/hr over 60 Minutes Intravenous 4 times per day 07/16/12 0152     07/15/12 2045   ciprofloxacin (CIPRO) IVPB 400 mg        400 mg 200 mL/hr over 60 Minutes Intravenous  Once 07/15/12 2044 07/15/12 2206   07/15/12 2000   levofloxacin (LEVAQUIN) IVPB 750 mg  Status:  Discontinued        750 mg 100 mL/hr over 90 Minutes Intravenous  Once 07/15/12 1959 07/15/12 2044   07/15/12 2000   metroNIDAZOLE (FLAGYL) IVPB 500 mg        500 mg 100 mL/hr over 60 Minutes Intravenous  Once 07/15/12 1959 07/15/12 2119          Ovidio Kin, MD, FACS Pager: [phone],   [facility] Office: [phone] 07/17/2012

## 2012-07-17 NOTE — Progress Notes (Signed)
Patient not very tender, a little distended.  Appetite not great.  Will go slowly with diet advancement  Marta Lamas. Gae Bon, MD, FACS [phone] [facility]

## 2012-07-19 ENCOUNTER — Telehealth (INDEPENDENT_AMBULATORY_CARE_PROVIDER_SITE_OTHER): Payer: Self-pay | Admitting: General Surgery

## 2012-07-19 ENCOUNTER — Other Ambulatory Visit: Payer: Self-pay | Admitting: Family Medicine

## 2012-07-19 NOTE — Telephone Encounter (Signed)
Patient called in stating she was not given any instruction on wound care when he drain was pulled on Saturday. Patient advised when she changes the dressing to make sure the area is clean and to use sterile 4x4's to cover until the drain site has completely closed. Patient advised she can stop covering the area once she is sure the area has completely closed and there is no drainage. Patient advised to use anti-bacterial soap and allow the water to run over the area if uncovered and pat it dry. If she keeps the dressing on the site to make sure to change it and not allow damp dressing on the site so it does not encourage bacterial growth. Patient advised to look out for any spreading redness or infection. Otherwise the patient is doing everything right.

## 2012-07-28 ENCOUNTER — Ambulatory Visit (INDEPENDENT_AMBULATORY_CARE_PROVIDER_SITE_OTHER): Payer: BC Managed Care – PPO | Admitting: General Surgery

## 2012-07-28 ENCOUNTER — Encounter (INDEPENDENT_AMBULATORY_CARE_PROVIDER_SITE_OTHER): Payer: Self-pay | Admitting: General Surgery

## 2012-07-28 VITALS — BP 120/86 | HR 80 | Temp 97.7°F | Resp 14 | Ht 62.0 in | Wt 155.8 lb

## 2012-07-28 DIAGNOSIS — Z9049 Acquired absence of other specified parts of digestive tract: Secondary | ICD-10-CM | POA: Insufficient documentation

## 2012-07-28 DIAGNOSIS — Z9889 Other specified postprocedural states: Secondary | ICD-10-CM

## 2012-07-28 NOTE — Progress Notes (Signed)
Subjective:     Patient ID: Jennifer [name], female   DOB: [birth year], 56 y.o.   MRN: [id number]  HPI Patient with laparoscopic appendectomy. At the time of admission, she also had a significant urinary tract infection. She was treated for that. She has been home. She is eating other things to taste a little weird. She is gradually regaining her energy.  Review of Systems     Objective:   Physical Exam Abdomen is soft and nontender, incisions are all well-healed. No evidence of infection.    Assessment:     Doing well after laparoscopic appendectomy    Plan:     No heavy lifting for 4 weeks after surgery. She is said to return to work in 2 weeks. She starting with a decreased schedule. In addition I suggest she speak with Dr. Faustino Congress her care physician regarding followup of her urinary tract. She did not have any significant symptoms with this urinary tract infection. Only back pain. And she does have chronic back pain. I will see her back as needed.

## 2012-07-29 ENCOUNTER — Ambulatory Visit (INDEPENDENT_AMBULATORY_CARE_PROVIDER_SITE_OTHER): Payer: BC Managed Care – PPO | Admitting: Family Medicine

## 2012-07-29 ENCOUNTER — Encounter: Payer: Self-pay | Admitting: Family Medicine

## 2012-07-29 VITALS — BP 108/76 | HR 83 | Temp 98.7°F | Resp 16 | Ht 62.5 in | Wt 155.6 lb

## 2012-07-29 DIAGNOSIS — E876 Hypokalemia: Secondary | ICD-10-CM

## 2012-07-29 DIAGNOSIS — Z87440 Personal history of urinary (tract) infections: Secondary | ICD-10-CM

## 2012-07-29 DIAGNOSIS — I10 Essential (primary) hypertension: Secondary | ICD-10-CM

## 2012-07-29 LAB — POCT URINALYSIS DIPSTICK
Glucose, UA: NEGATIVE
Ketones, UA: NEGATIVE
Nitrite, UA: NEGATIVE
Protein, UA: NEGATIVE
Spec Grav, UA: >=1.03
Urobilinogen, UA: 0.2
pH, UA: 5.5

## 2012-07-29 NOTE — Patient Instructions (Signed)
Take HCTZ 1/2 tablet daily; look at your tabs when you get home so you know which tablet should be cut in half. Take your other tablet as directed.   Hypokalemia Hypokalemia means a low potassium level in the blood.Potassium is an electrolyte that helps regulate the amount of fluid in the body. It also stimulates muscle contraction and maintains a stable acid-base balance.Most of the body's potassium is inside of cells, and only a very small amount is in the blood. Because the amount in the blood is so small, minor changes can have big effects. PREPARATION FOR TEST Testing for potassium requires taking a blood sample taken by needle from a vein in the arm. The skin is cleaned thoroughly before the sample is drawn. There is no other special preparation needed. NORMAL VALUES Potassium levels below 3.5 mEq/L are abnormally low. Levels above 5.1 mEq/L are abnormally high. Ranges for normal findings may vary among different laboratories and hospitals. You should always check with your doctor after having lab work or other tests done to discuss the meaning of your test results and whether your values are considered within normal limits. MEANING OF TEST  Your caregiver will go over the test results with you and discuss the importance and meaning of your results, as well as treatment options and the need for additional tests, if necessary. A potassium level is frequently part of a routine medical exam. It is usually included as part of a whole "panel" of tests for several blood salts (such as Sodium and Chloride). It may be done as part of follow-up when a low potassium level was found in the past or other blood salts are suspected of being out of balance. A low potassium level might be suspected if you have one or more of the following:  Symptoms of weakness.  Abnormal heart rhythms.  High blood pressure and are taking medication to control this, especially water pills (diuretics).  Kidney disease  that can affect your potassium level .  Diabetes requiring the use of insulin. The potassium may fall after taking insulin, especially if the diabetes had been out of control for a while.  A condition requiring the use of cortisone-type medication or certain types of antibiotics.  Vomiting and/or diarrhea for more than a day or two.  A stomach or intestinal condition that may not permit appropriate absorption of potassium.  Fainting episodes.  Mental confusion. OBTAINING TEST RESULTS It is your responsibility to obtain your test results. Ask the lab or department performing the test when and how you will get your results.  Please contact your caregiver directly if you have not received the results within one week. At that time, ask if there is anything different or new you should be doing in relation to the results. TREATMENT Hypokalemia can be treated with potassium supplements taken by mouth and/or adjustments in your current medications. A diet high in potassium is also helpful. Foods with high potassium content are:  Peas, lentils, lima beans, nuts, and dried fruit.  Whole grain and bran cereals and breads.  Fresh fruit, vegetables (bananas, cantaloupe, grapefruit, oranges, tomatoes, honeydew melons, potatoes).  Orange and tomato juices.  Meats. If potassium supplement has been prescribed for you today or your medications have been adjusted, see your personal caregiver in time02 for a re-check. SEEK MEDICAL CARE IF:  There is a feeling of worsening weakness.  You experience repeated chest palpitations.  You are diabetic and having difficulty keeping your blood sugars in the normal  range.  You are experiencing vomiting and/or diarrhea.  You are having difficulty with any of your regular medications. SEEK IMMEDIATE MEDICAL CARE IF:  You experience chest pain, shortness of breath, or episodes of dizziness.  You have been having vomiting or diarrhea for more than 2  days.  You have a fainting episode. MAKE SURE YOU:   Understand these instructions.  Will watch your condition.  Will get help right away if you are not doing well or get worse. Document Released: 08/11/2005 Document Revised: 11/03/2011 Document Reviewed: 07/22/2008 [facility] Patient Information 2013 [place], [place].

## 2012-07-30 LAB — BASIC METABOLIC PANEL
BUN: 27 mg/dL — ABNORMAL HIGH (ref 6–23)
CO2: 25 mEq/L (ref 19–32)
Calcium: 9.6 mg/dL (ref 8.4–10.5)
Chloride: 102 mEq/L (ref 96–112)
Creat: 0.99 mg/dL (ref 0.50–1.10)
Glucose, Bld: 100 mg/dL — ABNORMAL HIGH (ref 70–99)
Potassium: 3.6 mEq/L (ref 3.5–5.3)
Sodium: 139 mEq/L (ref 135–145)

## 2012-07-30 LAB — VITAMIN D 25 HYDROXY (VIT D DEFICIENCY, FRACTURES): Vit D, 25-Hydroxy: 64 ng/mL (ref 30–89)

## 2012-07-30 NOTE — Progress Notes (Signed)
Quick Note:  Please notify pt that results are normal.   Provide pt with copy of labs. ______ 

## 2012-07-31 NOTE — Progress Notes (Signed)
S:  This 56 y.o. Cauc female returns today after hospitalization for appendectomy in late November. She feels good and had post-op f/u with surgeon yesterday. She had low K+ in the hospital and was treated for UTI. She  is here for labs and UA.  She does c/o muscle cramping and aching in limbs. She denies fever/chills, fatigue, n/v/d, constipation, back pain, abd or pelvic pain, dysuria, hematuria, HA, dizziness, weakness or syncope.  ROS: As per HPI>  O:  Filed Vitals:   07/29/12 1114  BP: 108/76  Pulse: 83  Temp: 98.7 F (37.1 C)  Resp: 16   GEN: In NAD, WN,WD. HEENT: [employer]/AT; EOMI w/ clear conj/sscl. Oroph clear w/ moist mucosa. COR: RRR. LUNGS: CTA. BACK: No CVAT. EXT: No c/c/e.  SKIN: No rashes or pallor. NEURO: A&O x 3; CNs intact. Nonfocal.   07/16/2012: K+= 2.8   A/P: 1. Hypokalemia  Basic metabolic panel  2. Hx: UTI (urinary tract infection)  UA in office negative for infection  3. HTN (hypertension)  Continue Metoprolol succinate 100 mg  1 tab daily. HCTZ- take 1/2 tab daily.

## 2012-10-01 ENCOUNTER — Other Ambulatory Visit: Payer: Self-pay | Admitting: Family Medicine

## 2012-10-19 ENCOUNTER — Telehealth: Payer: Self-pay

## 2012-10-19 MED ORDER — METOPROLOL SUCCINATE ER 100 MG PO TB24: 100.0000 mg | ORAL_TABLET | Freq: Every day | ORAL | Status: DC

## 2012-10-19 NOTE — Telephone Encounter (Signed)
done

## 2012-10-19 NOTE — Telephone Encounter (Signed)
Patient needs a weeks worth of high blood pressure meds to get her through to her appointment next Wednesday   [facility] [address]    [phone] (H)

## 2012-10-27 ENCOUNTER — Encounter: Payer: Self-pay | Admitting: Family Medicine

## 2012-10-27 ENCOUNTER — Ambulatory Visit (INDEPENDENT_AMBULATORY_CARE_PROVIDER_SITE_OTHER): Payer: BC Managed Care – PPO | Admitting: Family Medicine

## 2012-10-27 VITALS — BP 145/90 | HR 82 | Temp 98.1°F | Resp 16 | Ht 62.0 in | Wt 159.4 lb

## 2012-10-27 DIAGNOSIS — H9313 Tinnitus, bilateral: Secondary | ICD-10-CM

## 2012-10-27 DIAGNOSIS — M545 Low back pain, unspecified: Secondary | ICD-10-CM

## 2012-10-27 DIAGNOSIS — G8929 Other chronic pain: Secondary | ICD-10-CM

## 2012-10-27 DIAGNOSIS — H9319 Tinnitus, unspecified ear: Secondary | ICD-10-CM

## 2012-10-27 DIAGNOSIS — I10 Essential (primary) hypertension: Secondary | ICD-10-CM

## 2012-10-27 MED ORDER — TOPIRAMATE 50 MG PO TABS: 50.0000 mg | ORAL_TABLET | Freq: Every day | ORAL | Status: DC

## 2012-10-27 MED ORDER — METOPROLOL SUCCINATE ER 100 MG PO TB24: 100.0000 mg | ORAL_TABLET | Freq: Every day | ORAL | Status: DC

## 2012-10-27 MED ORDER — HYDROCHLOROTHIAZIDE 25 MG PO TABS: 25.0000 mg | ORAL_TABLET | Freq: Every day | ORAL | Status: DC
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
## 2012-11-01 DIAGNOSIS — G8929 Other chronic pain: Secondary | ICD-10-CM | POA: Insufficient documentation

## 2012-11-26 ENCOUNTER — Other Ambulatory Visit: Payer: Self-pay | Admitting: Physician Assistant

## 2013-05-04 ENCOUNTER — Ambulatory Visit (INDEPENDENT_AMBULATORY_CARE_PROVIDER_SITE_OTHER): Payer: BC Managed Care – PPO | Admitting: Family Medicine

## 2013-05-04 ENCOUNTER — Encounter: Payer: Self-pay | Admitting: Family Medicine

## 2013-05-04 VITALS — BP 130/86 | HR 50 | Temp 98.0°F | Resp 16 | Ht 62.0 in | Wt 153.0 lb

## 2013-05-04 DIAGNOSIS — Z1211 Encounter for screening for malignant neoplasm of colon: Secondary | ICD-10-CM

## 2013-05-04 DIAGNOSIS — R5381 Other malaise: Secondary | ICD-10-CM

## 2013-05-04 DIAGNOSIS — I10 Essential (primary) hypertension: Secondary | ICD-10-CM

## 2013-05-04 DIAGNOSIS — T50905A Adverse effect of unspecified drugs, medicaments and biological substances, initial encounter: Secondary | ICD-10-CM

## 2013-05-04 DIAGNOSIS — T887XXA Unspecified adverse effect of drug or medicament, initial encounter: Secondary | ICD-10-CM

## 2013-05-04 DIAGNOSIS — D649 Anemia, unspecified: Secondary | ICD-10-CM

## 2013-05-04 DIAGNOSIS — Z0000 Encounter for general adult medical examination without abnormal findings: Secondary | ICD-10-CM

## 2013-05-04 LAB — COMPREHENSIVE METABOLIC PANEL
ALT: 11 U/L (ref 0–35)
AST: 15 U/L (ref 0–37)
Albumin: 4.3 g/dL (ref 3.5–5.2)
Alkaline Phosphatase: 48 U/L (ref 39–117)
BUN: 21 mg/dL (ref 6–23)
CO2: 27 mEq/L (ref 19–32)
Calcium: 9.1 mg/dL (ref 8.4–10.5)
Chloride: 106 mEq/L (ref 96–112)
Creat: 0.99 mg/dL (ref 0.50–1.10)
Glucose, Bld: 93 mg/dL (ref 70–99)
Potassium: 3.9 mEq/L (ref 3.5–5.3)
Sodium: 139 mEq/L (ref 135–145)
Total Bilirubin: 0.5 mg/dL (ref 0.3–1.2)
Total Protein: 6.6 g/dL (ref 6.0–8.3)

## 2013-05-04 LAB — CBC WITH DIFFERENTIAL/PLATELET
Basophils Absolute: 0 10*3/uL (ref 0.0–0.1)
Basophils Relative: 1 % (ref 0–1)
Eosinophils Absolute: 0.2 10*3/uL (ref 0.0–0.7)
Eosinophils Relative: 3 % (ref 0–5)
HCT: 38.3 % (ref 36.0–46.0)
Hemoglobin: 13 g/dL (ref 12.0–15.0)
Lymphocytes Relative: 23 % (ref 12–46)
Lymphs Abs: 1.6 10*3/uL (ref 0.7–4.0)
MCH: 31.1 pg (ref 26.0–34.0)
MCHC: 33.9 g/dL (ref 30.0–36.0)
MCV: 91.6 fL (ref 78.0–100.0)
Monocytes Absolute: 0.4 10*3/uL (ref 0.1–1.0)
Monocytes Relative: 6 % (ref 3–12)
Neutro Abs: 4.8 10*3/uL (ref 1.7–7.7)
Neutrophils Relative %: 67 % (ref 43–77)
Platelets: 298 10*3/uL (ref 150–400)
RBC: 4.18 MIL/uL (ref 3.87–5.11)
RDW: 13.8 % (ref 11.5–15.5)
WBC: 7.1 10*3/uL (ref 4.0–10.5)

## 2013-05-04 LAB — POCT URINALYSIS DIPSTICK
Bilirubin, UA: NEGATIVE
Blood, UA: NEGATIVE
Glucose, UA: NEGATIVE
Ketones, UA: NEGATIVE
Leukocytes, UA: NEGATIVE
Nitrite, UA: NEGATIVE
Protein, UA: NEGATIVE
Spec Grav, UA: >=1.03
Urobilinogen, UA: 0.2
pH, UA: 6

## 2013-05-04 LAB — LIPID PANEL
Cholesterol: 235 mg/dL — ABNORMAL HIGH (ref 0–200)
HDL: 40 mg/dL (ref >39)
LDL Cholesterol: 156 mg/dL — ABNORMAL HIGH (ref 0–99)
Total CHOL/HDL Ratio: 5.9 Ratio
Triglycerides: 197 mg/dL — ABNORMAL HIGH (ref <150)
VLDL: 39 mg/dL (ref 0–40)

## 2013-05-04 LAB — IFOBT (OCCULT BLOOD): IFOBT: NEGATIVE

## 2013-05-04 MED ORDER — TOPIRAMATE 50 MG PO TABS: 50.0000 mg | ORAL_TABLET | Freq: Every day | ORAL | Status: DC

## 2013-05-04 NOTE — Progress Notes (Signed)
Subjective:    Patient ID: Jennifer [name], female    DOB: [birth year], 57 y.o.   MRN: [id number]  HPI  This 57 y.o. Cauc female is here for CPE; PAP/pelvic per by GYN- Dr. Sylvester Harder.  MMG  report to be sent to his office. Pt c/o fatigue and lack of energy, attributed to Metoprolol.  Pt takes this med in addition to HCTZ 25 mg 1/2 tab daily to lower BP. She does not monitor BP  at home; she has an old cuff and not sure it works. Otherwise, pt feels good and has been   exercising for weight loss.   HCM: PAP/MMG current.            IMM- ? (need to update at next visit).            CRS- never had; needs referral.  Patient Active Problem List   Diagnosis Date Noted  . Chronic LBP 11/01/2012  . S/P laparoscopic appendectomy 07/28/2012  . Migraines 09/24/2011  . Hypertension 09/24/2011  . Hyperlipidemia 09/24/2011    PMHx, Soc Hx reviewed. Pt adopted; no Fam Hx available.  Medications reconciled.   Review of Systems  Constitutional: Positive for fatigue. Negative for fever, diaphoresis and appetite change.  HENT: Negative.   Eyes:       Wears hard contacts; annual vision eval current.     Respiratory: Negative.   Cardiovascular: Negative.   Gastrointestinal: Negative.   Endocrine: Negative.   Genitourinary: Negative.   Musculoskeletal: Negative.   Skin: Negative.   Allergic/Immunologic: Negative.   Neurological: Negative.   Hematological: Negative.   Psychiatric/Behavioral: Negative.        Objective:   Physical Exam  Nursing note and vitals reviewed. Constitutional: She is oriented to person, place, and time. Vital signs are normal. She appears well-developed and well-nourished. No distress.  Weight down 13 lbs since Jan 2013.  HENT:  Head: Normocephalic and atraumatic.  Right Ear: Hearing, external ear and ear canal normal. Tympanic membrane is scarred. Tympanic membrane is not erythematous and not bulging. No middle ear effusion. No decreased hearing is noted.   Left Ear: Hearing, external ear and ear canal normal. Tympanic membrane is scarred. Tympanic membrane is not erythematous and not bulging.  No middle ear effusion. No decreased hearing is noted.  Nose: Nose normal. No mucosal edema, nasal deformity or septal deviation.  Mouth/Throat: Uvula is midline, oropharynx is clear and moist and mucous membranes are normal. No oral lesions. Normal dentition. No dental caries.  Eyes: Conjunctivae, EOM and lids are normal. Pupils are equal, round, and reactive to light. No scleral icterus.  Fundoscopic exam:      The right eye shows no papilledema. The right eye shows red reflex.       The left eye shows no papilledema.  Neck: Trachea normal, normal range of motion and full passive range of motion without pain. Neck supple. No JVD present. No spinous process tenderness and no muscular tenderness present. Normal range of motion present. No mass and no thyromegaly present.  Cardiovascular: Normal rate, regular rhythm, S1 normal, S2 normal, normal heart sounds and normal pulses.   No extrasystoles are present. PMI is not displaced.  Exam reveals no gallop and no friction rub.   No murmur heard. Pulmonary/Chest: Effort normal and breath sounds normal. No respiratory distress.  Abdominal: Soft. Normal appearance and bowel sounds are normal. She exhibits no distension, no pulsatile midline mass and no mass. There is no hepatosplenomegaly. There is  no tenderness. There is no guarding and no CVA tenderness. No hernia.  Genitourinary:  Deferred.  Musculoskeletal: Normal range of motion. She exhibits no edema and no tenderness.  Neurological: She is alert and oriented to person, place, and time. She has normal strength and normal reflexes. She is not disoriented. She displays no atrophy and no tremor. No cranial nerve deficit or sensory deficit. She exhibits normal muscle tone. Coordination and gait normal.  Skin: Skin is warm, dry and intact. No ecchymosis and no rash  noted. She is not diaphoretic. No cyanosis or erythema. No pallor. Nails show no clubbing.  Psychiatric: She has a normal mood and affect. Her speech is normal and behavior is normal. Judgment and thought content normal. Cognition and memory are normal.  Speech occasionally tangential and pt inattentive at times.    Results for orders placed in visit on 05/04/13  POCT URINALYSIS DIPSTICK      Result Value Range   Color, UA yellow     Clarity, UA clear     Glucose, UA neg     Bilirubin, UA neg     Ketones, UA neg     Spec Grav, UA >=1.030     Blood, UA neg     pH, UA 6.0     Protein, UA neg     Urobilinogen, UA 0.2     Nitrite, UA neg     Leukocytes, UA Negative    IFOBT (OCCULT BLOOD)      Result Value Range   IFOBT Negative         Assessment & Plan:  Routine general medical examination at a health care facility - Plan: POCT urinalysis dipstick, IFOBT POC (occult bld, rslt in office), Lipid panel Need to review Immunizations at follow-up visit.  Other malaise and fatigue - Suspect due to B-blocker medication.       Plan: Vitamin D, 25-hydroxy  Medication side effect, initial encounter- Metoprolol 100 mg 1 tab daily will be reduced to 1/2 tablet daily.  Anemia - Plan: CBC with Differential  HTN (hypertension) - Medication change as above; recheck in 4 weeks. Pt to bring BP coff from home for calibration. Plan: Comprehensive metabolic panel, T3, Free, T4, Free  Encounter for colorectal cancer screening - Plan: Ambulatory referral to Gastroenterology

## 2013-05-04 NOTE — Patient Instructions (Addendum)

## 2013-05-05 LAB — T3, FREE: T3, Free: 2.7 pg/mL (ref 2.3–4.2)

## 2013-05-05 LAB — T4, FREE: Free T4: 0.92 ng/dL (ref 0.80–1.80)

## 2013-05-05 LAB — VITAMIN D 25 HYDROXY (VIT D DEFICIENCY, FRACTURES): Vit D, 25-Hydroxy: 66 ng/mL (ref 30–89)

## 2013-05-08 NOTE — Progress Notes (Signed)
Quick Note:  Please contact pt and advise that the following labs are abnormal...  Lipid panel is improved from last year but Total cholesterol and LDL ("bad") cholesterol are too high. We will need to discuss possible medication treatment at your next visit in October.  All other labs are normal.  Copy to pt. ______

## 2013-05-09 ENCOUNTER — Encounter: Payer: Self-pay | Admitting: Family Medicine

## 2013-06-02 ENCOUNTER — Encounter: Payer: Self-pay | Admitting: Family Medicine

## 2013-06-02 ENCOUNTER — Ambulatory Visit (INDEPENDENT_AMBULATORY_CARE_PROVIDER_SITE_OTHER): Payer: BC Managed Care – PPO | Admitting: Family Medicine

## 2013-06-02 VITALS — BP 120/70 | HR 52 | Temp 98.2°F | Resp 16 | Ht 62.5 in | Wt 153.4 lb

## 2013-06-02 DIAGNOSIS — I10 Essential (primary) hypertension: Secondary | ICD-10-CM

## 2013-06-02 DIAGNOSIS — T50905S Adverse effect of unspecified drugs, medicaments and biological substances, sequela: Secondary | ICD-10-CM

## 2013-06-02 DIAGNOSIS — E785 Hyperlipidemia, unspecified: Secondary | ICD-10-CM

## 2013-06-02 MED ORDER — BD ASSURE BPM/AUTO WRIST CUFF MISC: 1.0000 | Freq: Every day | Status: DC

## 2013-06-02 MED ORDER — HYDROCHLOROTHIAZIDE 25 MG PO TABS: 25.0000 mg | ORAL_TABLET | Freq: Every day | ORAL | Status: DC

## 2013-06-02 NOTE — Progress Notes (Signed)
S:  This 57 y.o. Cauc female has well controlled HTN but c/o persistent fatigue and sluggishness. She has little energy beyond what is required for work; she works Clinical biochemist at [employer]. She has reduce Metoprolol to 1/2 tablet ut does not notice a difference. She brought a BP cuff to have it checked against office device; a friend gave this cuff to her and it is reading 20 points above our office measurement. Pt denies diaphoresis, CP or tightness, palpitations, SOB or DOE, edema, HA, dizziness or numbness.  Pt has lipid disorder and is here today to discuss possible medication treatment versus nutrition and OTC supplements  (Fish Oil). She does not want to take any prescription medication but will increase activity level and improve nutrition to improve lipid profile. Her CAD risk is above average.  PMHx, Soc Hx and Fam Hx reviewed.  ROS: As per HPI.  O: Filed Vitals:   06/02/13 1142  BP: 120/70  Pulse: 52  Temp: 98.2 F (36.8 C)  Resp: 16   GEN: In NAD; appears tired. HENT: [place]/AT; EOMI w/ clear conj/ sclerae. Otherwise unremarkable. COR: RRR. LUNGS: Normal resp rate and effort. SKIN: W&D; no jaundice or pallor. Pt is tanned. NEURO: A&O x 3; CNs intact. Nonfocal.  Results for orders placed in visit on 05/04/13  LIPID PANEL      Result Value Range   Cholesterol 235 (*) 0 - 200 mg/dL   Triglycerides 161 (*) <150 mg/dL   HDL 40  >09 mg/dL   Total CHOL/HDL Ratio 5.9     VLDL 39  0 - 40 mg/dL   LDL Cholesterol 604 (*) 0 - 99 mg/dL  COMPREHENSIVE METABOLIC PANEL      Result Value Range   Sodium 139  135 - 145 mEq/L   Potassium 3.9  3.5 - 5.3 mEq/L   Chloride 106  96 - 112 mEq/L   CO2 27  19 - 32 mEq/L   Glucose, Bld 93  70 - 99 mg/dL   BUN 21  6 - 23 mg/dL   Creat 5.40  9.81 - 1.91 mg/dL   Total Bilirubin 0.5  0.3 - 1.2 mg/dL   Alkaline Phosphatase 48  39 - 117 U/L   AST 15  0 - 37 U/L   ALT 11  0 - 35 U/L   Total Protein 6.6  6.0 - 8.3 g/dL   Albumin 4.3  3.5 - 5.2  g/dL   Calcium 9.1  8.4 - 47.8 mg/dL   A/P: Hypertension- Reduce Metoprolol 100 mg dose to 1/4 tablet = 25 mg/day.  Medication side effect, sequela- Fatigue due to B-Blocker.  Hyperlipidemia- Pt will start Fish Oil and modify nutrition; she will research Mediterranean Diet.

## 2013-06-02 NOTE — Patient Instructions (Addendum)
Hypertriglyceridemia  Diet for High blood levels of Triglycerides Most fats in food are triglycerides. Triglycerides in your blood are stored as fat in your body. High levels of triglycerides in your blood may put you at a greater risk for heart disease and stroke.  Normal triglyceride levels are less than 150 mg/dL. Borderline high levels are 150-199 mg/dl. High levels are 200 - 499 mg/dL, and very high triglyceride levels are greater than 500 mg/dL. The decision to treat high triglycerides is generally based on the level. For people with borderline or high triglyceride levels, treatment includes weight loss and exercise. Drugs are recommended for people with very high triglyceride levels. Many people who need treatment for high triglyceride levels have metabolic syndrome. This syndrome is a collection of disorders that often include: insulin resistance, high blood pressure, blood clotting problems, high cholesterol and triglycerides. TESTING PROCEDURE FOR TRIGLYCERIDES  You should not eat 4 hours before getting your triglycerides measured. The normal range of triglycerides is between 10 and 250 milligrams per deciliter (mg/dl). Some people may have extreme levels (1000 or above), but your triglyceride level may be too high if it is above 150 mg/dl, depending on what other risk factors you have for heart disease.  People with high blood triglycerides may also have high blood cholesterol levels. If you have high blood cholesterol as well as high blood triglycerides, your risk for heart disease is probably greater than if you only had high triglycerides. High blood cholesterol is one of the main risk factors for heart disease. CHANGING YOUR DIET  Your weight can affect your blood triglyceride level. If you are more than 20% above your ideal body weight, you may be able to lower your blood triglycerides by losing weight. Eating less and exercising regularly is the best way to combat this. Fat provides more  calories than any other food. The best way to lose weight is to eat less fat. Only 30% of your total calories should come from fat. Less than 7% of your diet should come from saturated fat. A diet low in fat and saturated fat is the same as a diet to decrease blood cholesterol. By eating a diet lower in fat, you may lose weight, lower your blood cholesterol, and lower your blood triglyceride level.  Eating a diet low in fat, especially saturated fat, may also help you lower your blood triglyceride level. Ask your dietitian to help you figure how much fat you can eat based on the number of calories your caregiver has prescribed for you.  Exercise, in addition to helping with weight loss may also help lower triglyceride levels.   Alcohol can increase blood triglycerides. You may need to stop drinking alcoholic beverages.  Too much carbohydrate in your diet may also increase your blood triglycerides. Some complex carbohydrates are necessary in your diet. These may include bread, rice, potatoes, other starchy vegetables and cereals.  Reduce "simple" carbohydrates. These may include pure sugars, candy, honey, and jelly without losing other nutrients. If you have the kind of high blood triglycerides that is affected by the amount of carbohydrates in your diet, you will need to eat less sugar and less high-sugar foods. Your caregiver can help you with this.  Adding 2-4 grams of fish oil (EPA+ DHA) may also help lower triglycerides. Speak with your caregiver before adding any supplements to your regimen. Following the Diet  Maintain your ideal weight. Your caregivers can help you with a diet. Generally, eating less food and getting more   exercise will help you lose weight. Joining a weight control group may also help. Ask your caregivers for a good weight control group in your area.  Eat low-fat foods instead of high-fat foods. This can help you lose weight too.  These foods are lower in fat. Eat MORE of these:    Dried beans, peas, and lentils.  Egg whites.  Low-fat cottage cheese.  Fish.  Lean cuts of meat, such as round, sirloin, rump, and flank (cut extra fat off meat you fix).  Whole grain breads, cereals and pasta.  Skim and nonfat dry milk.  Low-fat yogurt.  Poultry without the skin.  Cheese made with skim or part-skim milk, such as mozzarella, parmesan, farmers', ricotta, or pot cheese. These are higher fat foods. Eat LESS of these:   Whole milk and foods made from whole milk, such as American, blue, cheddar, monterey jack, and swiss cheese  High-fat meats, such as luncheon meats, sausages, knockwurst, bratwurst, hot dogs, ribs, corned beef, ground pork, and regular ground beef.  Fried foods. Limit saturated fats in your diet. Substituting unsaturated fat for saturated fat may decrease your blood triglyceride level. You will need to read package labels to know which products contain saturated fats.   These foods are high in saturated fat. Eat LESS of these:   Fried pork skins.  Whole milk.  Skin and fat from poultry.  Palm oil.  Butter.  Shortening.  Cream cheese.  Jennifer [name].  Margarines and baked goods made from listed oils.  Vegetable shortenings.  Chitterlings.  Fat from meats.  Coconut oil.  Palm kernel oil.  Lard.  Cream.  Sour cream.  Fatback.  Coffee whiteners and non-dairy creamers made with these oils.  Cheese made from whole milk. Use unsaturated fats (both polyunsaturated and monounsaturated) moderately. Remember, even though unsaturated fats are better than saturated fats; you still want a diet low in total fat.  These foods are high in unsaturated fat:   Canola oil.  Sunflower oil.  Mayonnaise.  Almonds.  Peanuts.  Pine nuts.  Margarines made with these oils.  Safflower oil.  Olive oil.  Avocados.  Cashews.  Peanut butter.  Sunflower seeds.  Soybean oil.  Peanut  oil.  Olives.  Pecans.  Walnuts.  Pumpkin seeds. Avoid sugar and other high-sugar foods. This will decrease carbohydrates without decreasing other nutrients. Sugar in your food goes rapidly to your blood. When there is excess sugar in your blood, your liver may use it to make more triglycerides. Sugar also contains calories without other important nutrients.  Eat LESS of these:   Sugar, brown sugar, powdered sugar, jam, jelly, preserves, honey, syrup, molasses, pies, candy, cakes, cookies, frosting, pastries, colas, soft drinks, punches, fruit drinks, and regular gelatin.  Avoid alcohol. Alcohol, even more than sugar, may increase blood triglycerides. In addition, alcohol is high in calories and low in nutrients. Ask for sparkling water, or a diet soft drink instead of an alcoholic beverage. Suggestions for planning and preparing meals   Bake, broil, grill or roast meats instead of frying.  Remove fat from meats and skin from poultry before cooking.  Add spices, herbs, lemon juice or vinegar to vegetables instead of salt, rich sauces or gravies.  Use a non-stick skillet without fat or use no-stick sprays.  Cool and refrigerate stews and broth. Then remove the hardened fat floating on the surface before serving.  Refrigerate meat drippings and skim off fat to make low-fat gravies.  Serve more fish.  Use less  butter, margarine and other high-fat spreads on bread or vegetables.  Use skim or reconstituted non-fat dry milk for cooking.  Cook with low-fat cheeses.  Substitute low-fat yogurt or cottage cheese for all or part of the sour cream in recipes for sauces, dips or congealed salads.  Use half yogurt/half mayonnaise in salad recipes.  Substitute evaporated skim milk for cream. Evaporated skim milk or reconstituted non-fat dry milk can be whipped and substituted for whipped cream in certain recipes.  Choose fresh fruits for dessert instead of high-fat foods such as pies or  cakes. Fruits are naturally low in fat. When Dining Out   Order low-fat appetizers such as fruit or vegetable juice, pasta with vegetables or tomato sauce.  Select clear, rather than cream soups.  Ask that dressings and gravies be served on the side. Then use less of them.  Order foods that are baked, broiled, poached, steamed, stir-fried, or roasted.  Ask for margarine instead of butter, and use only a small amount.  Drink sparkling water, unsweetened tea or coffee, or diet soft drinks instead of alcohol or other sweet beverages. QUESTIONS AND ANSWERS ABOUT OTHER FATS IN THE BLOOD: SATURATED FAT, TRANS FAT, AND CHOLESTEROL What is trans fat? Trans fat is a type of fat that is formed when vegetable oil is hardened through a process called hydrogenation. This process helps makes foods more solid, gives them shape, and prolongs their shelf life. Trans fats are also called hydrogenated or partially hydrogenated oils.  What do saturated fat, trans fat, and cholesterol in foods have to do with heart disease? Saturated fat, trans fat, and cholesterol in the diet all raise the level of LDL "bad" cholesterol in the blood. The higher the LDL cholesterol, the greater the risk for coronary heart disease (CHD). Saturated fat and trans fat raise LDL similarly.  What foods contain saturated fat, trans fat, and cholesterol? High amounts of saturated fat are found in animal products, such as fatty cuts of meat, chicken skin, and full-fat dairy products like butter, whole milk, cream, and cheese, and in tropical vegetable oils such as palm, palm kernel, and coconut oil. Trans fat is found in some of the same foods as saturated fat, such as vegetable shortening, some margarines (especially hard or stick margarine), crackers, cookies, baked goods, fried foods, salad dressings, and other processed foods made with partially hydrogenated vegetable oils. Small amounts of trans fat also occur naturally in some animal  products, such as milk products, beef, and lamb. Foods high in cholesterol include liver, other organ meats, egg yolks, shrimp, and full-fat dairy products. How can I use the new food label to make heart-healthy food choices? Check the Nutrition Facts panel of the food label. Choose foods lower in saturated fat, trans fat, and cholesterol. For saturated fat and cholesterol, you can also use the Percent Daily Value (%DV): 5% DV or less is low, and 20% DV or more is high. (There is no %DV for trans fat.) Use the Nutrition Facts panel to choose foods low in saturated fat and cholesterol, and if the trans fat is not listed, read the ingredients and limit products that list shortening or hydrogenated or partially hydrogenated vegetable oil, which tend to be high in trans fat. POINTS TO REMEMBER:   Discuss your risk for heart disease with your caregivers, and take steps to reduce risk factors.  Change your diet. Choose foods that are low in saturated fat, trans fat, and cholesterol.  Add exercise to your daily routine  if it is not already being done. Participate in physical activity of moderate intensity, like brisk walking, for at least 30 minutes on most, and preferably all days of the week. No time? Break the 30 minutes into three, 10-minute segments during the day.  Stop smoking. If you do smoke, contact your caregiver to discuss ways in which they can help you quit.  Do not use street drugs.  Maintain a normal weight.  Maintain a healthy blood pressure.  Keep up with your blood work for checking the fats in your blood as directed by your caregiver. Document Released: 05/29/2004 Document Revised: 02/10/2012 Document Reviewed: 12/25/2008 [facility] Patient Information 2014 [place], [place].     MEDITERRANEAN DIET- you can find information on line about healthier eating than can help lower your lipid numbers.

## 2013-07-13 ENCOUNTER — Ambulatory Visit (INDEPENDENT_AMBULATORY_CARE_PROVIDER_SITE_OTHER): Payer: BC Managed Care – PPO | Admitting: Family Medicine

## 2013-07-13 ENCOUNTER — Encounter: Payer: Self-pay | Admitting: Family Medicine

## 2013-07-13 VITALS — BP 142/86 | HR 72 | Temp 98.1°F | Resp 16 | Ht 62.0 in | Wt 154.4 lb

## 2013-07-13 DIAGNOSIS — E785 Hyperlipidemia, unspecified: Secondary | ICD-10-CM

## 2013-07-13 DIAGNOSIS — I10 Essential (primary) hypertension: Secondary | ICD-10-CM

## 2013-07-13 NOTE — Progress Notes (Signed)
S:  This 57 y.o. Cauc female has HTN, controlled on Metoprolol but accompanied by mild fatigue and sluggishness. Current medication dose is 100 mg 1/4 tablet daily with HCTZ 25 mg. Pt reports less fatigue in the last 2 weeks. She sleeps < 6 hours per night due to work hours. She works at [employer] and holiday hours are starting. 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 Pt denies diaphoresis, vision disturbance, CP or tightness, palpitations, SOB or DOE, numbness, weakness or syncope.  Patient Active Problem List   Diagnosis Date Noted  . Chronic LBP 11/01/2012  . S/P laparoscopic appendectomy 07/28/2012  . Migraines 09/24/2011  . Hypertension 09/24/2011  . Hyperlipidemia 09/24/2011   PMHx, Soc Hx and Fam Hx reviewed. Medications reconciled.  ROS: As per HPI.  O:  Filed Vitals:   07/13/13 1159  BP: 142/86  Pulse: 72  Temp: 98.1 F (36.7 C)  Resp: 16   GEN: In NAD; WN, WD. HENT: [place]/AT; EOMI w/ mildly injected conj and clear sclerae. EACs/nose/oroph unremarkable. COR: RRR. No edema. LUNGS: Normal resp rate and effort. SKIN: W&D; intact w/o diaphoresis, erythema, rashes or pallor. MS: MAEs; no c/c/e.  NEURO: A&O x 3; CNs intact. Nonfocal.  A/P: Hypertension- Stable w/ less fatigue; continue Metoprolol 100 mg 1/4 tablet daily(= 25 mg) and HCTZ. Advised pt to try to get at least 6 hours of sleep most nights of the week.  Hyperlipidemia- Advised improved nutrition, regular exercise and continue Mega Red Krill Oil capsules once a day.

## 2013-10-13 ENCOUNTER — Encounter: Payer: Self-pay | Admitting: Family Medicine

## 2013-10-13 ENCOUNTER — Ambulatory Visit (INDEPENDENT_AMBULATORY_CARE_PROVIDER_SITE_OTHER): Payer: BC Managed Care – PPO | Admitting: Family Medicine

## 2013-10-13 VITALS — BP 110/70 | HR 62 | Temp 98.4°F | Resp 16 | Ht 62.5 in | Wt 158.2 lb

## 2013-10-13 DIAGNOSIS — M25561 Pain in right knee: Secondary | ICD-10-CM

## 2013-10-13 DIAGNOSIS — G8929 Other chronic pain: Secondary | ICD-10-CM

## 2013-10-13 DIAGNOSIS — M25569 Pain in unspecified knee: Secondary | ICD-10-CM

## 2013-10-13 DIAGNOSIS — I10 Essential (primary) hypertension: Secondary | ICD-10-CM

## 2013-10-13 DIAGNOSIS — M25562 Pain in left knee: Secondary | ICD-10-CM

## 2013-10-13 MED ORDER — METOPROLOL SUCCINATE ER 50 MG PO TB24: ORAL_TABLET | ORAL | Status: DC

## 2013-10-13 MED ORDER — TRAMADOL HCL 50 MG PO TABS
ORAL_TABLET | ORAL | Status: DC
Start: 2013-10-13 — End: 2014-05-18

## 2013-10-13 MED ORDER — HYDROCHLOROTHIAZIDE 25 MG PO TABS: 25.0000 mg | ORAL_TABLET | Freq: Every day | ORAL | Status: DC

## 2013-10-13 NOTE — Patient Instructions (Signed)
ARNICA GEL or CREAM is good for bruises and joint pain. You can get this medication over the counter.

## 2013-10-13 NOTE — Progress Notes (Signed)
S:  This 58 y.o. Cauc female has well controlled HTN on current medications. She takes 1/4 tablet of Metoprolol succinate 100 mg and HCTZ 25 mg daily. No reported side effects and energy level is good. She works 2 jobs so fatigue is due to driving to [place], [place] as well as having chronic mild insomnia.  Pt has chronic bilateral knee pain, managed w/ topical analgesic and Ibuprofen. Work conditions require that she stand on hard surface though they do have padding for joint and low back protection. Good footwear helps minimize negative impact. Pt has had chiropractic treatment.  Patient Active Problem List   Diagnosis Date Noted  . Chronic LBP 11/01/2012  . S/P laparoscopic appendectomy 07/28/2012  . Migraines 09/24/2011  . Hypertension 09/24/2011  . Hyperlipidemia 09/24/2011   MEDICATIONS reconciled.  PMHx, Surg Hx, Soc and Fam Hx reviewed.  O: Filed Vitals:   10/13/13 1146  BP: 110/70  Pulse: 62  Temp: 98.4 F (36.9 C)  Resp: 16   GEN: in NAD; WN,WD. HENT: [place]/AT; EOMI w/ clear conj/sclerae. Otherwise unremarkable. COR: RRR. No edema. LUNGS: Unlabored resp. MS: MAEs. No joint effusions or warmth; good ROM w/o tenderness or crepitus. No instability. SKIN: W&D; intact w/o diaphoresis, jaundice or pallor. NEURO: A&O x 3; CNs intact. Nonfocal.  A/P: Hypertension- Continue Metoprolol succinate 50 mg 24 hr  1/2 tablet daily and HCTZ daily.  Bilateral chronic knee pain- Trial Tramadol 50 1 tablet bid or hs prn pain. May try a different topical analgesic and attention to footwear.   Meds ordered this encounter  Medications  . hydrochlorothiazide (HYDRODIURIL) 25 MG tablet    Sig: Take 1 tablet (25 mg total) by mouth daily.    Dispense:  90 tablet    Refill:  1  . metoprolol succinate (TOPROL-XL) 50 MG 24 hr tablet    Sig: Take 1/2- 1 tablet by mouth daily. Take with or immediately following a meal.    Dispense:  90 tablet    Refill:  1  . traMADol (ULTRAM) 50 MG tablet   Sig: Take 1 tablet at bedtime for knee and back pain.    Dispense:  30 tablet    Refill:  0

## 2013-10-14 DIAGNOSIS — G8929 Other chronic pain: Secondary | ICD-10-CM | POA: Insufficient documentation

## 2013-10-14 DIAGNOSIS — M25562 Pain in left knee: Secondary | ICD-10-CM

## 2013-10-14 DIAGNOSIS — M25561 Pain in right knee: Secondary | ICD-10-CM

## 2013-12-04 ENCOUNTER — Other Ambulatory Visit: Payer: Self-pay | Admitting: Family Medicine

## 2014-05-18 ENCOUNTER — Encounter: Payer: Self-pay | Admitting: Family Medicine

## 2014-05-18 ENCOUNTER — Ambulatory Visit (INDEPENDENT_AMBULATORY_CARE_PROVIDER_SITE_OTHER): Payer: BC Managed Care – PPO | Admitting: Family Medicine

## 2014-05-18 VITALS — BP 150/80 | HR 83 | Temp 98.5°F | Resp 16 | Ht 63.0 in | Wt 158.4 lb

## 2014-05-18 DIAGNOSIS — F5102 Adjustment insomnia: Secondary | ICD-10-CM

## 2014-05-18 DIAGNOSIS — I10 Essential (primary) hypertension: Secondary | ICD-10-CM

## 2014-05-18 MED ORDER — METOPROLOL SUCCINATE ER 50 MG PO TB24: ORAL_TABLET | ORAL | Status: DC

## 2014-05-18 MED ORDER — HYDROCHLOROTHIAZIDE 25 MG PO TABS: 25.0000 mg | ORAL_TABLET | Freq: Every day | ORAL | Status: DC

## 2014-05-18 MED ORDER — TOPIRAMATE 50 MG PO TABS: ORAL_TABLET | ORAL | Status: DC

## 2014-05-18 MED ORDER — TRAMADOL HCL 50 MG PO TABS: ORAL_TABLET | ORAL | Status: DC

## 2014-05-18 NOTE — Patient Instructions (Signed)
You state that you have migraines whenever the barometric pressure changes. If you feel a migraine coming on, you can increase Topiramate to 50 mg twice a day for a day or two.   I will see you in 6 months for physical and fasting labs.

## 2014-05-21 ENCOUNTER — Encounter: Payer: Self-pay | Admitting: Family Medicine

## 2014-05-21 NOTE — Progress Notes (Signed)
S:  This 58 y.o. Cauc female has HTN which has been well controlled (Feb 2015 BP= 110/70). Pt reports compliance w/ medications w/o adverse effects; she has been without BP meds for a few days.  Migraine medication (Topamax) is extremely effective. Recent family stressors (suicide of family friend) have altered pt's sleep habits. She also has flare of knee and back pain (work associated) that impacts sleep hygiene.  Patient Active Problem List   Diagnosis Date Noted  . Bilateral chronic knee pain 10/14/2013  . Chronic LBP 11/01/2012  . S/P laparoscopic appendectomy 07/28/2012  . Migraines 09/24/2011  . Hypertension 09/24/2011  . Hyperlipidemia 09/24/2011    Prior to Admission medications   Medication Sig Start Date End Date Taking? Authorizing Provider  Blood Pressure Monitoring (B-D ASSURE BPM/AUTO WRIST CUFF) MISC 1 Device by Does not apply route daily. 06/02/13  Yes Barton Fanny, MD  hydrochlorothiazide (HYDRODIURIL) 25 MG tablet Take 1 tablet (25 mg total) by mouth daily.   Yes Barton Fanny, MD  ibuprofen (ADVIL,MOTRIN) 200 MG tablet Take 200 mg by mouth.   Yes Historical Provider, MD  metoprolol succinate (TOPROL-XL) 50 MG 24 hr tablet Take 1/2- 1 tablet by mouth daily. Take with or immediately following a meal.   Yes Barton Fanny, MD  Omega-3 Fatty Acids (OMEGA-3 FISH OIL PO) Take 1 capsule by mouth daily at 6 (six) AM.   Yes Historical Provider, MD  topiramate (TOPAMAX) 50 MG tablet TAKE 1 TABLET EVERY DAY   Yes Barton Fanny, MD  traMADol (ULTRAM) 50 MG tablet Take 1 tablet at bedtime for knee and back pain.   Yes Barton Fanny, MD    History   Social History  . Marital Status: Widowed    Spouse Name: N/A    Number of Children: N/A  . Years of Education: N/A   Occupational History  . Not on file.   Social History Main Topics  . Smoking status: Never Smoker   . Smokeless tobacco: Not on file  . Alcohol Use: Yes     Comment: 2 drinks once  every 2-3 months  . Drug Use: No  . Sexual Activity: Not on file   Other Topics Concern  . Not on file   Social History Narrative  . No narrative on file    Family History  Problem Relation Age of Onset  . Adopted: Yes    ROS: As per HPI. Negative for diaphoresis, fatigue, vision disturbance, CP or tightness, palpitations, edema, SOB or DOE, cough, GI problems or abd pain, HA, dizziness, numbness, weakness or syncope.  O: Filed Vitals:   05/18/14 1109  BP: 150/80  Pulse: 83  Temp: 98.5 F (36.9 C)  Resp: 16    GEN: In NAD; WN,WD. HENT: [place]/AT; EOMI w/ clear conj/sclerae. Otherwise unremarkable. COR: RRR. LUNGS: Normal resp rate and effort. SKIN: Tanned; intact w/o erythema, rash or pallor. NEURO: A&O x 3; CNs intact. Nonfocal. PSYCH: Pleasant but anxious demeanor; talkative w/ slightly pressured speech. Attentive with normal thought pattern and sound judgement.   A/P: Essential hypertension- Elevated today off meds; continue current medications.  Insomnia due to stress- Pt states that normal sleep pattern will return eventually w/ use of non=pharmaceutical remedies.  Meds ordered this encounter  Medications  . hydrochlorothiazide (HYDRODIURIL) 25 MG tablet    Sig: Take 1 tablet (25 mg total) by mouth daily.    Dispense:  90 tablet    Refill:  1  . metoprolol succinate (  TOPROL-XL) 50 MG 24 hr tablet    Sig: Take 1/2- 1 tablet by mouth daily. Take with or immediately following a meal.    Dispense:  90 tablet    Refill:  1  . traMADol (ULTRAM) 50 MG tablet    Sig: Take 1 tablet at bedtime for knee and back pain.    Dispense:  30 tablet    Refill:  1  . topiramate (TOPAMAX) 50 MG tablet    Sig: TAKE 1 TABLET EVERY DAY    Dispense:  30 tablet    Refill:  11

## 2014-11-15 ENCOUNTER — Encounter: Payer: BC Managed Care – PPO | Admitting: Family Medicine

## 2014-11-29 ENCOUNTER — Other Ambulatory Visit: Payer: Self-pay | Admitting: Family Medicine

## 2014-12-15 ENCOUNTER — Encounter: Payer: Self-pay | Admitting: Family Medicine

## 2014-12-27 ENCOUNTER — Other Ambulatory Visit: Payer: Self-pay | Admitting: Urgent Care

## 2015-01-24 ENCOUNTER — Other Ambulatory Visit: Payer: Self-pay | Admitting: Physician Assistant

## 2015-02-06 ENCOUNTER — Other Ambulatory Visit: Payer: Self-pay | Admitting: Urgent Care

## 2015-06-08 ENCOUNTER — Telehealth: Payer: Self-pay

## 2015-06-13 ENCOUNTER — Ambulatory Visit (INDEPENDENT_AMBULATORY_CARE_PROVIDER_SITE_OTHER): Payer: 59 | Admitting: Urgent Care

## 2015-06-13 ENCOUNTER — Encounter: Payer: Self-pay | Admitting: Urgent Care

## 2015-06-13 VITALS — BP 156/93 | HR 69 | Temp 98.3°F | Resp 16 | Ht 62.5 in | Wt 158.0 lb

## 2015-06-13 DIAGNOSIS — G43809 Other migraine, not intractable, without status migrainosus: Secondary | ICD-10-CM

## 2015-06-13 DIAGNOSIS — I10 Essential (primary) hypertension: Secondary | ICD-10-CM

## 2015-06-13 DIAGNOSIS — R5383 Other fatigue: Secondary | ICD-10-CM

## 2015-06-13 DIAGNOSIS — IMO0001 Reserved for inherently not codable concepts without codable children: Secondary | ICD-10-CM

## 2015-06-13 DIAGNOSIS — R030 Elevated blood-pressure reading, without diagnosis of hypertension: Secondary | ICD-10-CM | POA: Diagnosis not present

## 2015-06-13 LAB — POCT URINALYSIS DIP (MANUAL ENTRY)
Bilirubin, UA: NEGATIVE
Blood, UA: NEGATIVE
Glucose, UA: NEGATIVE
Ketones, POC UA: NEGATIVE
Nitrite, UA: NEGATIVE
Protein Ur, POC: NEGATIVE
Spec Grav, UA: 1.02
Urobilinogen, UA: 0.2
pH, UA: 6

## 2015-06-13 LAB — COMPLETE METABOLIC PANEL WITH GFR
ALT: 10 U/L (ref 6–29)
AST: 14 U/L (ref 10–35)
Albumin: 4.2 g/dL (ref 3.6–5.1)
Alkaline Phosphatase: 65 U/L (ref 33–130)
BUN: 20 mg/dL (ref 7–25)
CO2: 24 mmol/L (ref 20–31)
Calcium: 8.8 mg/dL (ref 8.6–10.4)
Chloride: 111 mmol/L — ABNORMAL HIGH (ref 98–110)
Creat: 0.97 mg/dL (ref 0.50–1.05)
GFR, Est African American: 74 mL/min (ref >=60)
GFR, Est Non African American: 65 mL/min (ref >=60)
Glucose, Bld: 94 mg/dL (ref 65–99)
Potassium: 4.1 mmol/L (ref 3.5–5.3)
Sodium: 142 mmol/L (ref 135–146)
Total Bilirubin: 0.4 mg/dL (ref 0.2–1.2)
Total Protein: 6 g/dL — ABNORMAL LOW (ref 6.1–8.1)

## 2015-06-13 LAB — TSH: TSH: 2.083 u[IU]/mL (ref 0.350–4.500)

## 2015-06-13 MED ORDER — TOPIRAMATE 50 MG PO TABS: ORAL_TABLET | ORAL | Status: DC

## 2015-06-13 MED ORDER — HYDROCHLOROTHIAZIDE 25 MG PO TABS: 25.0000 mg | ORAL_TABLET | Freq: Every day | ORAL | Status: DC

## 2015-06-13 MED ORDER — LISINOPRIL 10 MG PO TABS: 10.0000 mg | ORAL_TABLET | Freq: Every day | ORAL | Status: DC

## 2015-06-13 MED ORDER — SUMATRIPTAN SUCCINATE 25 MG PO TABS: ORAL_TABLET | ORAL | Status: DC

## 2015-06-13 NOTE — Patient Instructions (Signed)
Managing Your High Blood Pressure Blood pressure is a measurement of how forceful your blood is pressing against the walls of the arteries. Arteries are muscular tubes within the circulatory system. Blood pressure does not stay the same. Blood pressure rises when you are active, excited, or nervous; and it lowers during sleep and relaxation. If the numbers measuring your blood pressure stay above normal most of the time, you are at risk for health problems. High blood pressure (hypertension) is a long-term (chronic) condition in which blood pressure is elevated. A blood pressure reading is recorded as two numbers, such as 120 over 80 (or 120/80). The first, higher number is called the systolic pressure. It is a measure of the pressure in your arteries as the heart beats. The second, lower number is called the diastolic pressure. It is a measure of the pressure in your arteries as the heart relaxes between beats.  Keeping your blood pressure in a normal range is important to your overall health and prevention of health problems, such as heart disease and stroke. When your blood pressure is uncontrolled, your heart has to work harder than normal. High blood pressure is a very common condition in adults because blood pressure tends to rise with age. Men and women are equally likely to have hypertension but at different times in life. Before age 45, men are more likely to have hypertension. After 58 years of age, women are more likely to have it. Hypertension is especially common in African Americans. This condition often has no signs or symptoms. The cause of the condition is usually not known. Your caregiver can help you come up with a plan to keep your blood pressure in a normal, healthy range. BLOOD PRESSURE STAGES Blood pressure is classified into four stages: normal, prehypertension, stage 1, and stage 2. Your blood pressure reading will be used to determine what type of treatment, if any, is necessary.  Appropriate treatment options are tied to these four stages:  Normal  Systolic pressure (mm Hg): below 120.  Diastolic pressure (mm Hg): below 80. Prehypertension  Systolic pressure (mm Hg): 120 to 139.  Diastolic pressure (mm Hg): 80 to 89. Stage1  Systolic pressure (mm Hg): 140 to 159.  Diastolic pressure (mm Hg): 90 to 99. Stage2  Systolic pressure (mm Hg): 160 or above.  Diastolic pressure (mm Hg): 100 or above. RISKS RELATED TO HIGH BLOOD PRESSURE Managing your blood pressure is an important responsibility. Uncontrolled high blood pressure can lead to:  A heart attack.  A stroke.  A weakened blood vessel (aneurysm).  Heart failure.  Kidney damage.  Eye damage.  Metabolic syndrome.  Memory and concentration problems. HOW TO MANAGE YOUR BLOOD PRESSURE Blood pressure can be managed effectively with lifestyle changes and medicines (if needed). Your caregiver will help you come up with a plan to bring your blood pressure within a normal range. Your plan should include the following: Education  Read all information provided by your caregivers about how to control blood pressure.  Educate yourself on the latest guidelines and treatment recommendations. New research is always being done to further define the risks and treatments for high blood pressure. Lifestylechanges  Control your weight.  Avoid smoking.  Stay physically active.  Reduce the amount of salt in your diet.  Reduce stress.  Control any chronic conditions, such as high cholesterol or diabetes.  Reduce your alcohol intake. Medicines  Several medicines (antihypertensive medicines) are available, if needed, to bring blood pressure within a normal range.  Communication  Review all the medicines you take with your caregiver because there may be side effects or interactions.  Talk with your caregiver about your diet, exercise habits, and other lifestyle factors that may be contributing to  high blood pressure.  See your caregiver regularly. Your caregiver can help you create and adjust your plan for managing high blood pressure. RECOMMENDATIONS FOR TREATMENT AND FOLLOW-UP  The following recommendations are based on current guidelines for managing high blood pressure in nonpregnant adults. Use these recommendations to identify the proper follow-up period or treatment option based on your blood pressure reading. You can discuss these options with your caregiver.  Systolic pressure of 132 to 440 or diastolic pressure of 80 to 89: Follow up with your caregiver as directed.  Systolic pressure of 102 to 725 or diastolic pressure of 90 to 100: Follow up with your caregiver within 2 months.  Systolic pressure above 366 or diastolic pressure above 440: Follow up with your caregiver within 1 month.  Systolic pressure above 347 or diastolic pressure above 425: Consider antihypertensive therapy; follow up with your caregiver within 1 week.  Systolic pressure above 956 or diastolic pressure above 387: Begin antihypertensive therapy; follow up with your caregiver within 1 week.   This information is not intended to replace advice given to you by your health care provider. Make sure you discuss any questions you have with your health care provider.   Document Released: 05/05/2012 Document Reviewed: 05/05/2012 Elsevier Interactive Patient Education 2016 [employer].    Migraine Headache A migraine headache is an intense, throbbing pain on one or both sides of your head. A migraine can last for 30 minutes to several hours. CAUSES  The exact cause of a migraine headache is not always known. However, a migraine may be caused when nerves in the brain become irritated and release chemicals that cause inflammation. This causes pain. Certain things may also trigger migraines, such as:  Alcohol.  Smoking.  Stress.  Menstruation.  Aged cheeses.  Foods or drinks that contain nitrates,  glutamate, aspartame, or tyramine.  Lack of sleep.  Chocolate.  Caffeine.  Hunger.  Physical exertion.  Fatigue.  Medicines used to treat chest pain (nitroglycerine), birth control pills, estrogen, and some blood pressure medicines. SIGNS AND SYMPTOMS  Pain on one or both sides of your head.  Pulsating or throbbing pain.  Severe pain that prevents daily activities.  Pain that is aggravated by any physical activity.  Nausea, vomiting, or both.  Dizziness.  Pain with exposure to bright lights, loud noises, or activity.  General sensitivity to bright lights, loud noises, or smells. Before you get a migraine, you may get warning signs that a migraine is coming (aura). An aura may include:  Seeing flashing lights.  Seeing bright spots, halos, or zigzag lines.  Having tunnel vision or blurred vision.  Having feelings of numbness or tingling.  Having trouble talking.  Having muscle weakness. DIAGNOSIS  A migraine headache is often diagnosed based on:  Symptoms.  Physical exam.  A CT scan or MRI of your head. These imaging tests cannot diagnose migraines, but they can help rule out other causes of headaches. TREATMENT Medicines may be given for pain and nausea. Medicines can also be given to help prevent recurrent migraines.  HOME CARE INSTRUCTIONS  Only take over-the-counter or prescription medicines for pain or discomfort as directed by your health care provider. The use of long-term narcotics is not recommended.  Lie down in a dark, quiet  room when you have a migraine.  Keep a journal to find out what may trigger your migraine headaches. For example, write down:  What you eat and drink.  How much sleep you get.  Any change to your diet or medicines.  Limit alcohol consumption.  Quit smoking if you smoke.  Get 7-9 hours of sleep, or as recommended by your health care provider.  Limit stress.  Keep lights dim if bright lights bother you and make  your migraines worse. SEEK IMMEDIATE MEDICAL CARE IF:   Your migraine becomes severe.  You have a fever.  You have a stiff neck.  You have vision loss.  You have muscular weakness or loss of muscle control.  You start losing your balance or have trouble walking.  You feel faint or pass out.  You have severe symptoms that are different from your first symptoms. MAKE SURE YOU:   Understand these instructions.  Will watch your condition.  Will get help right away if you are not doing well or get worse.   This information is not intended to replace advice given to you by your health care provider. Make sure you discuss any questions you have with your health care provider.   Document Released: 08/11/2005 Document Revised: 09/01/2014 Document Reviewed: 04/18/2013 Elsevier Interactive Patient Education [employer].

## 2015-06-13 NOTE — Progress Notes (Signed)
MRN: [id number] DOB: September 07, 1955  Subjective:   Jennifer [name] is a 59 y.o. female presenting for chief complaint of Medication Refill  HTN - managed with HCT, metoprolol. Has been without her BP medications, stopped metoprolol on her own due to lethargy. Patient checks her BP at home using a wrist cuff and states that her BP has been higher at home than it was today (932 systolic). Admits unhealthy diet, some exercise, walks her dogs in the morning and sometimes at night. Drinks very little water. Admits headaches (see below), intermittent dizziness. Denies double vision, chest pain, shortness of breath, heart racing, palpitations, nausea, vomiting, abdominal pain, hematuria, lower leg swelling. Patient is adopted, does not know her family medical history. Denies smoking cigarettes, rare alcohol drink. Denies any other aggravating or relieving factors, no other questions or concerns.  Migraines - takes topiramate prophylactically. Gets headaches daily. Uses goody powders, otc tylenol with phenylephrine. Has a history of migraines, has been worked up previously. Used to take Imitrex with good relief of her headaches. Gets ~5-6 hours of sleep per night. Works at [employer] and another site in [place]. 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 Admits significant financial burden and would like to work less but cannot afford this. Patient is widowed ~7 years, has 2 children, has good support network with them and her in-laws. Diet and exercise as above. ROS as above.  Jennifer [name] has a current medication list which includes the following prescription(s): b-d assure bpm/auto wrist cuff, hydrochlorothiazide, ibuprofen, metoprolol succinate, omega-3 fatty acids, topiramate, and tramadol. Also has No Known Allergies.  Jennifer [name]  has a past medical history of Hypertension and Migraine. Also  has past surgical history that includes laparoscopic appendectomy (07/15/2012) and Appendectomy.  Objective:     Vitals: BP 167/99 mmHg  Pulse 69  Temp(Src) 98.3 F (36.8 C) (Oral)  Resp 16  Ht 5' 2.5" (1.588 m)  Wt 158 lb (71.668 kg)  BMI 28.42 kg/m2  BP Readings from Last 3 Encounters:  06/13/15 167/99  05/18/14 150/80  10/13/13 110/70   Physical Exam  Constitutional: She is oriented to person, place, and time. She appears well-developed and well-nourished.  HENT:  Mouth/Throat: Oropharynx is clear and moist.  Eyes: Pupils are equal, round, and reactive to light. Right eye exhibits no discharge. Left eye exhibits no discharge. No scleral icterus.  Neck: Normal range of motion. Neck supple. No thyromegaly present.  Cardiovascular: Normal rate, regular rhythm and intact distal pulses.  Exam reveals no gallop and no friction rub.   No murmur heard. Pulmonary/Chest: No respiratory distress. She has no wheezes. She has no rales.  Abdominal: Soft. Bowel sounds are normal. She exhibits no distension and no mass. There is no tenderness.  Musculoskeletal: She exhibits no edema.  Neurological: She is alert and oriented to person, place, and time.  Skin: Skin is warm and dry. No rash noted. No erythema. No pallor.   Results for orders placed or performed in visit on 06/13/15 (from the past 24 hour(s))  POCT urinalysis dipstick     Status: Abnormal   Collection Time: 06/13/15  9:43 AM  Result Value Ref Range   Color, UA light yellow (A) yellow   Clarity, UA clear clear   Glucose, UA negative negative   Bilirubin, UA negative negative   Ketones, POC UA negative negative   Spec Grav, UA 1.020    Blood, UA negative negative   pH, UA 6.0  Protein Ur, POC negative negative   Urobilinogen, UA 0.2    Nitrite, UA Negative Negative   Leukocytes, UA Trace (A) Negative   Assessment and Plan :   1. Essential hypertension 2. Elevated blood pressure - labs pending  - refill provided for hydrochlorothiazide, start lisinopril, stop metoprolol. Patient to continue checking blood pressure at  home. - I counseled the patient extensively on the importance of blood pressure management, significant lifestyle modifications and overall just taking better care of herself. I recommended followup in 4 weeks, patient agreed.  3. Other fatigue - TSH pending, most likely due to patient working 2 jobs, not sleeping well.  4. Other migraine without status migrainosus, not intractable - refill provided for Topamax, Imitrex. Counseled on potential for adverse effects, patient verbalized understanding.  Jaynee Eagles, PA-C [facility] [phone] 06/13/2015 8:09 AM

## 2015-06-21 ENCOUNTER — Telehealth: Payer: Self-pay

## 2015-06-21 NOTE — Telephone Encounter (Signed)
Patient is calling because she started taking lisinopril Wednesday last week and by Sunday she started feeling very lethargic. Patient states that she could barely stand or speak and would like to know if something else can be prescribed. Please advise!

## 2015-06-21 NOTE — Telephone Encounter (Signed)
Left detailed VM for patient to stop taking Lisinopril. Informed pt I will contact her once I find out an alternative medication for her to take.   Please advise.

## 2015-06-21 NOTE — Telephone Encounter (Signed)
Will send to Rosario Adie to decide since he started the medication.

## 2015-06-22 MED ORDER — AMLODIPINE BESYLATE 5 MG PO TABS: 5.0000 mg | ORAL_TABLET | Freq: Every day | ORAL | Status: DC

## 2015-06-22 NOTE — Telephone Encounter (Signed)
I read the message to [facility]. She said she will pick up her amLODipine (NORVASC) 5 MG tablet [[id number]] script. She will CB in a week with her BP readings.

## 2015-06-22 NOTE — Telephone Encounter (Signed)
Left VM for patient. Advised patient to hold lisinopril for now. I offered patient amlodipine 5mg  to help control her BP. I will send this to her pharmacy today. Please have patient check her BP on her own every other and have her report those recordings in 1 week. Thank you!

## 2015-07-05 NOTE — Telephone Encounter (Signed)
No msg °

## 2015-07-11 ENCOUNTER — Ambulatory Visit (INDEPENDENT_AMBULATORY_CARE_PROVIDER_SITE_OTHER): Payer: 59 | Admitting: Urgent Care

## 2015-07-11 ENCOUNTER — Encounter: Payer: Self-pay | Admitting: Urgent Care

## 2015-07-11 VITALS — BP 117/76 | HR 90 | Temp 98.2°F | Resp 16 | Ht 63.0 in | Wt 154.0 lb

## 2015-07-11 DIAGNOSIS — G8929 Other chronic pain: Secondary | ICD-10-CM

## 2015-07-11 DIAGNOSIS — I10 Essential (primary) hypertension: Secondary | ICD-10-CM

## 2015-07-11 DIAGNOSIS — R51 Headache: Secondary | ICD-10-CM

## 2015-07-11 DIAGNOSIS — R519 Headache, unspecified: Secondary | ICD-10-CM

## 2015-07-11 DIAGNOSIS — G43809 Other migraine, not intractable, without status migrainosus: Secondary | ICD-10-CM

## 2015-07-11 MED ORDER — MELOXICAM 15 MG PO TABS: 7.5000 mg | ORAL_TABLET | Freq: Every day | ORAL | Status: DC

## 2015-07-11 MED ORDER — AMLODIPINE BESYLATE 5 MG PO TABS: 5.0000 mg | ORAL_TABLET | Freq: Every day | ORAL | Status: DC

## 2015-07-11 NOTE — Progress Notes (Signed)
    MRN: [id number] DOB: [birth year]  Subjective:   Jennifer [name] is a 60 y.o. female presenting for follow up on HTN, headaches.   HTN - Reports compliance with HCT, amlodipine. Checks her BP at home, generally in 120's-130's. Admits lightheadedness intermittently. Denies double vision, chest pain, shortness of breath, heart racing, palpitations, nausea, vomiting, abdominal pain, hematuria, lower leg swelling.  Headaches - Reports that she has been taken Imitrex multiple times a week for headaches. Headaches are typically worse in the morning. She admits that she is not hydrating well still but she is making an effort. She also eats very irregularly, skips meals due to the demanding nature of her job. She has some difficulty sleeping at night due to anxiety and stress over work, Media planner. She denies confusion, weakness, disorientation, numbness or tingling, ROS as above. Has never been evaluated by neurologist for what she thinks are migraines.  Jennifer [name] has a current medication list which includes the following prescription(s): amlodipine, b-d assure bpm/auto wrist cuff, hydrochlorothiazide, ibuprofen, omega-3 fatty acids, sumatriptan, topiramate, and tramadol. Also has No Known Allergies.  Jennifer [name]  has a past medical history of Hypertension and Migraine. Also  has past surgical history that includes laparoscopic appendectomy (07/15/2012) and Appendectomy.  Objective:   Vitals: BP 117/76 mmHg  Pulse 90  Temp(Src) 98.2 F (36.8 C)  Resp 16  Ht 5\' 3"  (1.6 m)  Wt 154 lb (69.854 kg)  BMI 27.29 kg/m2  BP Readings from Last 3 Encounters:  07/11/15 117/76  06/13/15 156/93  05/18/14 150/80   Wt Readings from Last 3 Encounters:  07/11/15 154 lb (69.854 kg)  06/13/15 158 lb (71.668 kg)  05/18/14 158 lb 6.4 oz (71.85 kg)   Physical Exam  Constitutional: She is oriented to person, place, and time. She appears well-developed and well-nourished.  HENT:  Mouth/Throat: Oropharynx  is clear and moist.  Eyes: Right eye exhibits no discharge. Left eye exhibits no discharge. No scleral icterus.  Neck: Normal range of motion. Neck supple.  Cardiovascular: Normal rate, regular rhythm and intact distal pulses.   Pulmonary/Chest: No respiratory distress. She has no wheezes. She has no rales.  Musculoskeletal: She exhibits no edema.  Neurological: She is alert and oriented to person, place, and time.  Skin: Skin is warm and dry. No rash noted. No erythema. No pallor.  Psychiatric: She has a normal mood and affect.   Assessment and Plan :   1. Essential hypertension - Significantly improved, encouraged patient to continue dietary modifications. She has lost weight which is very motivating to patient. Recheck in 6 months.  2. Chronic nonintractable headache, unspecified headache type 3. Other migraine without status migrainosus, not intractable - Likely undergoing stress headaches due to lack of hydration, skipping meals, work and financial stress, lack of rest. Counseled patient on all of this, she is going to start to try and make an effort to take better care of herself. Will try benadryl for her sleep. Consider Ativan if this does not help. Also consider SSRI therapy for anxiety if she does not improve. Recheck in 6 months or sooner as needed.  Jennifer Eagles, PA-C [facility] [phone] 07/11/2015 10:11 AM

## 2015-07-11 NOTE — Patient Instructions (Addendum)
- Try to dyphenhydramine/Benadryl 50mg  at night for sleep.   Tension Headache A tension headache is a feeling of pain, pressure, or aching that is often felt over the front and sides of the head. The pain can be dull, or it can feel tight (constricting). Tension headaches are not normally associated with nausea or vomiting, and they do not get worse with physical activity. Tension headaches can last from 30 minutes to several days. This is the most common type of headache. CAUSES The exact cause of this condition is not known. Tension headaches often begin after stress, anxiety, or depression. Other triggers may include:  Alcohol.  Too much caffeine, or caffeine withdrawal.  Respiratory infections, such as colds, flu, or sinus infections.  Dental problems or teeth clenching.  Fatigue.  Holding your head and neck in the same position for a long period of time, such as while using a computer.  Smoking. SYMPTOMS Symptoms of this condition include:  A feeling of pressure around the head.  Dull, aching head pain.  Pain felt over the front and sides of the head.  Tenderness in the muscles of the head, neck, and shoulders. DIAGNOSIS This condition may be diagnosed based on your symptoms and a physical exam. Tests may be done, such as a CT scan or an MRI of your head. These tests may be done if your symptoms are severe or unusual. TREATMENT This condition may be treated with lifestyle changes and medicines to help relieve symptoms. HOME CARE INSTRUCTIONS Managing Pain  Take over-the-counter and prescription medicines only as told by your health care provider.  Lie down in a dark, quiet room when you have a headache.  If directed, apply ice to the head and neck area:  Put ice in a plastic bag.  Place a towel between your skin and the bag.  Leave the ice on for 20 minutes, 2-3 times per day.  Use a heating pad or a hot shower to apply heat to the head and neck area as told by  your health care provider. Eating and Drinking  Eat meals on a regular schedule.  Limit alcohol use.  Decrease your caffeine intake, or stop using caffeine. General Instructions  Keep all follow-up visits as told by your health care provider. This is important.  Keep a headache journal to help find out what may trigger your headaches. For example, write down:  What you eat and drink.  How much sleep you get.  Any change to your diet or medicines.  Try massage or other relaxation techniques.  Limit stress.  Sit up straight, and avoid tensing your muscles.  Do not use tobacco products, including cigarettes, chewing tobacco, or e-cigarettes. If you need help quitting, ask your health care provider.  Exercise regularly as told by your health care provider.  Get 7-9 hours of sleep, or the amount recommended by your health care provider. SEEK MEDICAL CARE IF:  Your symptoms are not helped by medicine.  You have a headache that is different from what you normally experience.  You have nausea or you vomit.  You have a fever. SEEK IMMEDIATE MEDICAL CARE IF:  Your headache becomes severe.  You have repeated vomiting.  You have a stiff neck.  You have a loss of vision.  You have problems with speech.  You have pain in your eye or ear.  You have muscular weakness or loss of muscle control.  You lose your balance or you have trouble walking.  You feel  faint or you pass out.  You have confusion.   This information is not intended to replace advice given to you by your health care provider. Make sure you discuss any questions you have with your health care provider.   Document Released: 08/11/2005 Document Revised: 05/02/2015 Document Reviewed: 12/04/2014 Elsevier Interactive Patient Education 2016 [employer] Your High Blood Pressure Blood pressure is a measurement of how forceful your blood is pressing against the walls of the arteries. Arteries  are muscular tubes within the circulatory system. Blood pressure does not stay the same. Blood pressure rises when you are active, excited, or nervous; and it lowers during sleep and relaxation. If the numbers measuring your blood pressure stay above normal most of the time, you are at risk for health problems. High blood pressure (hypertension) is a long-term (chronic) condition in which blood pressure is elevated. A blood pressure reading is recorded as two numbers, such as 120 over 80 (or 120/80). The first, higher number is called the systolic pressure. It is a measure of the pressure in your arteries as the heart beats. The second, lower number is called the diastolic pressure. It is a measure of the pressure in your arteries as the heart relaxes between beats.  Keeping your blood pressure in a normal range is important to your overall health and prevention of health problems, such as heart disease and stroke. When your blood pressure is uncontrolled, your heart has to work harder than normal. High blood pressure is a very common condition in adults because blood pressure tends to rise with age. Men and women are equally likely to have hypertension but at different times in life. Before age 67, men are more likely to have hypertension. After 58 years of age, women are more likely to have it. Hypertension is especially common in African Americans. This condition often has no signs or symptoms. The cause of the condition is usually not known. Your caregiver can help you come up with a plan to keep your blood pressure in a normal, healthy range. BLOOD PRESSURE STAGES Blood pressure is classified into four stages: normal, prehypertension, stage 1, and stage 2. Your blood pressure reading will be used to determine what type of treatment, if any, is necessary. Appropriate treatment options are tied to these four stages:  Normal  Systolic pressure (mm Hg): below 120.  Diastolic pressure (mm Hg): below  80. Prehypertension  Systolic pressure (mm Hg): 120 to 139.  Diastolic pressure (mm Hg): 80 to 89. Stage1  Systolic pressure (mm Hg): 140 to 159.  Diastolic pressure (mm Hg): 90 to 99. Stage2  Systolic pressure (mm Hg): 160 or above.  Diastolic pressure (mm Hg): 100 or above. RISKS RELATED TO HIGH BLOOD PRESSURE Managing your blood pressure is an important responsibility. Uncontrolled high blood pressure can lead to:  A heart attack.  A stroke.  A weakened blood vessel (aneurysm).  Heart failure.  Kidney damage.  Eye damage.  Metabolic syndrome.  Memory and concentration problems. HOW TO MANAGE YOUR BLOOD PRESSURE Blood pressure can be managed effectively with lifestyle changes and medicines (if needed). Your caregiver will help you come up with a plan to bring your blood pressure within a normal range. Your plan should include the following: Education  Read all information provided by your caregivers about how to control blood pressure.  Educate yourself on the latest guidelines and treatment recommendations. New research is always being done to further define the risks and treatments  for high blood pressure. Lifestylechanges  Control your weight.  Avoid smoking.  Stay physically active.  Reduce the amount of salt in your diet.  Reduce stress.  Control any chronic conditions, such as high cholesterol or diabetes.  Reduce your alcohol intake. Medicines  Several medicines (antihypertensive medicines) are available, if needed, to bring blood pressure within a normal range. Communication  Review all the medicines you take with your caregiver because there may be side effects or interactions.  Talk with your caregiver about your diet, exercise habits, and other lifestyle factors that may be contributing to high blood pressure.  See your caregiver regularly. Your caregiver can help you create and adjust your plan for managing high blood  pressure. RECOMMENDATIONS FOR TREATMENT AND FOLLOW-UP  The following recommendations are based on current guidelines for managing high blood pressure in nonpregnant adults. Use these recommendations to identify the proper follow-up period or treatment option based on your blood pressure reading. You can discuss these options with your caregiver.  Systolic pressure of 123456 to [id number] or diastolic pressure of 80 to 89: Follow up with your caregiver as directed.  Systolic pressure of XX123456 to 0000000 or diastolic pressure of 90 to 100: Follow up with your caregiver within 2 months.  Systolic pressure above 0000000 or diastolic pressure above 123XX123: Follow up with your caregiver within 1 month.  Systolic pressure above 99991111 or diastolic pressure above [id number]: Consider antihypertensive therapy; follow up with your caregiver within 1 week.  Systolic pressure above A999333 or diastolic pressure above 123456: Begin antihypertensive therapy; follow up with your caregiver within 1 week.   This information is not intended to replace advice given to you by your health care provider. Make sure you discuss any questions you have with your health care provider.   Document Released: 05/05/2012 Document Reviewed: 05/05/2012 Elsevier Interactive Patient Education [employer].

## 2015-10-11 ENCOUNTER — Ambulatory Visit (INDEPENDENT_AMBULATORY_CARE_PROVIDER_SITE_OTHER): Payer: BLUE CROSS/BLUE SHIELD | Admitting: Urgent Care

## 2015-10-11 VITALS — BP 142/80 | HR 77 | Temp 98.3°F | Resp 18 | Ht 62.5 in | Wt 152.2 lb

## 2015-10-11 DIAGNOSIS — R300 Dysuria: Secondary | ICD-10-CM | POA: Diagnosis not present

## 2015-10-11 DIAGNOSIS — R350 Frequency of micturition: Secondary | ICD-10-CM | POA: Diagnosis not present

## 2015-10-11 DIAGNOSIS — N3090 Cystitis, unspecified without hematuria: Secondary | ICD-10-CM

## 2015-10-11 LAB — POCT URINALYSIS DIP (MANUAL ENTRY)
Bilirubin, UA: NEGATIVE
Glucose, UA: NEGATIVE
Ketones, POC UA: NEGATIVE
Nitrite, UA: POSITIVE — AB
Protein Ur, POC: NEGATIVE
Spec Grav, UA: 1.025
Urobilinogen, UA: 0.2
pH, UA: 5.5

## 2015-10-11 LAB — POC MICROSCOPIC URINALYSIS (UMFC): Mucus: ABSENT

## 2015-10-11 MED ORDER — CIPROFLOXACIN HCL 500 MG PO TABS: 500.0000 mg | ORAL_TABLET | Freq: Two times a day (BID) | ORAL | Status: DC

## 2015-10-11 NOTE — Patient Instructions (Signed)

## 2015-10-11 NOTE — Progress Notes (Signed)
    MRN: [id number] DOB: 1955-10-06  Subjective:   Jennifer [name] is a 60 y.o. female presenting for chief complaint of Urinary Tract Infection  Reports 2 day history of dysuria, urinary frequency and urinary urgency, mild flank pain bilaterally. Has not tried any medications relief. Denies flank pain, abdominal pain, pelvic pain, cloudy malordorous urine, genital rash, genital irritation and vaginal discharge, fever. Denies any other aggravating or relieving factors, no other questions or concerns.  Jennifer [name] has a current medication list which includes the following prescription(s): amlodipine, b-d assure bpm/auto wrist cuff, hydrochlorothiazide, ibuprofen, meloxicam, omega-3 fatty acids, sumatriptan, topiramate, and tramadol. Patient has No Known Allergies.  Jennifer [name]  has a past medical history of Hypertension and Migraine. Also  has past surgical history that includes laparoscopic appendectomy (07/15/2012) and Appendectomy.  Objective:   Vitals: BP 142/80 mmHg  Pulse 77  Temp(Src) 98.3 F (36.8 C) (Oral)  Resp 18  Ht 5' 2.5" (1.588 m)  Wt 152 lb 3.2 oz (69.037 kg)  BMI 27.38 kg/m2  SpO2 94%  Physical Exam  Constitutional: She is oriented to person, place, and time. She appears well-developed and well-nourished.  Cardiovascular: Normal rate, regular rhythm and intact distal pulses.  Exam reveals no gallop and no friction rub.   No murmur heard. Pulmonary/Chest: No respiratory distress. She has no wheezes. She has no rales.  Abdominal: She exhibits no distension and no mass. There is no tenderness.  Neurological: She is alert and oriented to person, place, and time.  Skin: Skin is warm and dry.   Results for orders placed or performed in visit on 10/11/15 (from the past 24 hour(s))  POCT urinalysis dipstick     Status: Abnormal   Collection Time: 10/11/15  2:39 PM  Result Value Ref Range   Color, UA yellow yellow   Clarity, UA hazy (A) clear   Glucose, UA negative negative   Bilirubin, UA negative negative   Ketones, POC UA negative negative   Spec Grav, UA 1.025    Blood, UA trace-lysed (A) negative   pH, UA 5.5    Protein Ur, POC negative negative   Urobilinogen, UA 0.2    Nitrite, UA Positive (A) Negative   Leukocytes, UA small (1+) (A) Negative  POCT Microscopic Urinalysis ([facility])     Status: Abnormal   Collection Time: 10/11/15  2:39 PM  Result Value Ref Range   WBC,UR,HPF,POC Many (A) None WBC/hpf   RBC,UR,HPF,POC Few (A) None RBC/hpf   Bacteria Moderate (A) None, Too numerous to count   Mucus Absent Absent   Epithelial Cells, UR Per Microscopy Few (A) None, Too numerous to count cells/hpf   Assessment and Plan :   1. Cystitis 2. Urinary frequency 3. Dysuria - Start ciprofloxacin, advised aggressive hydration, urine culture pending.  Jaynee Eagles, PA-C [facility] [phone] 10/11/2015 2:20 PM

## 2015-10-13 LAB — URINE CULTURE: Colony Count: >=100000

## 2015-10-17 ENCOUNTER — Telehealth: Payer: Self-pay

## 2015-10-17 NOTE — Telephone Encounter (Signed)
Pt is needing to talk with someone about her uti symptoms, she has taken all of the medication and is still feeling like she has a full bladder and she just went to restroom, she is drinking cranberry juice, eating yogurt, and nothing is working   Best number [phone] ok to leave a message she will be available til 2

## 2015-10-18 NOTE — Telephone Encounter (Signed)
Mani,  Please see previous message

## 2015-10-19 MED ORDER — AMOXICILLIN-POT CLAVULANATE 875-125 MG PO TABS: 1.0000 | ORAL_TABLET | Freq: Two times a day (BID) | ORAL | Status: DC

## 2015-10-19 NOTE — Telephone Encounter (Signed)
Will add additional 5 days of Augmentin since patient is only slightly improved. Patient is aware.

## 2015-11-01 ENCOUNTER — Other Ambulatory Visit: Payer: Self-pay

## 2015-11-01 MED ORDER — AMLODIPINE BESYLATE 5 MG PO TABS: 5.0000 mg | ORAL_TABLET | Freq: Every day | ORAL | Status: DC

## 2016-01-10 ENCOUNTER — Ambulatory Visit: Payer: 59 | Admitting: Urgent Care

## 2016-01-29 ENCOUNTER — Encounter: Payer: Self-pay | Admitting: Family Medicine

## 2016-01-29 ENCOUNTER — Ambulatory Visit (INDEPENDENT_AMBULATORY_CARE_PROVIDER_SITE_OTHER): Payer: BLUE CROSS/BLUE SHIELD | Admitting: Family Medicine

## 2016-01-29 VITALS — BP 157/82 | HR 87 | Temp 98.2°F | Resp 18 | Ht 62.5 in | Wt 160.4 lb

## 2016-01-29 DIAGNOSIS — G43809 Other migraine, not intractable, without status migrainosus: Secondary | ICD-10-CM | POA: Diagnosis not present

## 2016-01-29 DIAGNOSIS — I10 Essential (primary) hypertension: Secondary | ICD-10-CM | POA: Diagnosis not present

## 2016-01-29 DIAGNOSIS — R350 Frequency of micturition: Secondary | ICD-10-CM

## 2016-01-29 DIAGNOSIS — E785 Hyperlipidemia, unspecified: Secondary | ICD-10-CM | POA: Diagnosis not present

## 2016-01-29 DIAGNOSIS — R309 Painful micturition, unspecified: Secondary | ICD-10-CM

## 2016-01-29 LAB — POCT URINALYSIS DIP (MANUAL ENTRY)
Bilirubin, UA: NEGATIVE
Blood, UA: NEGATIVE
Glucose, UA: NEGATIVE
Ketones, POC UA: NEGATIVE
Leukocytes, UA: NEGATIVE
Nitrite, UA: NEGATIVE
Protein Ur, POC: NEGATIVE
Spec Grav, UA: 1.02
Urobilinogen, UA: 0.2
pH, UA: 7

## 2016-01-29 LAB — CBC
HCT: 39.1 % (ref 35.0–45.0)
Hemoglobin: 13.1 g/dL (ref 11.7–15.5)
MCH: 31.2 pg (ref 27.0–33.0)
MCHC: 33.5 g/dL (ref 32.0–36.0)
MCV: 93.1 fL (ref 80.0–100.0)
MPV: 9.2 fL (ref 7.5–12.5)
Platelets: 275 10*3/uL (ref 140–400)
RBC: 4.2 MIL/uL (ref 3.80–5.10)
RDW: 13.1 % (ref 11.0–15.0)
WBC: 7.5 10*3/uL (ref 3.8–10.8)

## 2016-01-29 LAB — LIPID PANEL
Cholesterol: 240 mg/dL — ABNORMAL HIGH (ref 125–200)
HDL: 42 mg/dL — ABNORMAL LOW (ref >=46)
LDL Cholesterol: 147 mg/dL — ABNORMAL HIGH (ref <130)
Total CHOL/HDL Ratio: 5.7 Ratio — ABNORMAL HIGH (ref <=5.0)
Triglycerides: 254 mg/dL — ABNORMAL HIGH (ref <150)
VLDL: 51 mg/dL — ABNORMAL HIGH (ref <30)

## 2016-01-29 LAB — POC MICROSCOPIC URINALYSIS (UMFC): Mucus: ABSENT

## 2016-01-29 LAB — COMPLETE METABOLIC PANEL WITH GFR
ALT: 17 U/L (ref 6–29)
AST: 19 U/L (ref 10–35)
Albumin: 4.2 g/dL (ref 3.6–5.1)
Alkaline Phosphatase: 60 U/L (ref 33–130)
BUN: 26 mg/dL — ABNORMAL HIGH (ref 7–25)
CO2: 24 mmol/L (ref 20–31)
Calcium: 9.3 mg/dL (ref 8.6–10.4)
Chloride: 106 mmol/L (ref 98–110)
Creat: 1.06 mg/dL — ABNORMAL HIGH (ref 0.50–1.05)
GFR, Est African American: 67 mL/min (ref >=60)
GFR, Est Non African American: 58 mL/min — ABNORMAL LOW (ref >=60)
Glucose, Bld: 91 mg/dL (ref 65–99)
Potassium: 4.3 mmol/L (ref 3.5–5.3)
Sodium: 139 mmol/L (ref 135–146)
Total Bilirubin: 0.3 mg/dL (ref 0.2–1.2)
Total Protein: 6.5 g/dL (ref 6.1–8.1)

## 2016-01-29 MED ORDER — BUTALBITAL-APAP-CAFFEINE 50-325-40 MG PO TABS: 1.0000 | ORAL_TABLET | Freq: Four times a day (QID) | ORAL | Status: AC | PRN

## 2016-01-29 MED ORDER — AMLODIPINE BESYLATE 10 MG PO TABS: 10.0000 mg | ORAL_TABLET | Freq: Every day | ORAL | Status: DC

## 2016-01-29 NOTE — Patient Instructions (Addendum)
Please take two amlodipine daily until you run out then start new prescription of 10 mg. Continue to take your blood pressure daily.  Increase your fluids until your urine is light yellow.   Please call [facility] to see about accupucture for your migraines- [phone]  Acetaminophen; Butalbital; Caffeine tablets or capsules What is this medicine? ACETAMINOPHEN; BUTALBITAL; CAFFEINE (a set a MEE noe fen; byoo TAL bi tal; KAF een) is a pain reliever. It is used to treat tension headaches. This medicine may be used for other purposes; ask your health care provider or pharmacist if you have questions. What should I tell my health care provider before I take this medicine? They need to know if you have any of these conditions: -drug abuse or addiction -heart or circulation problems -if you often drink alcohol -kidney disease or problems going to the bathroom -liver disease -lung disease, asthma, or breathing problems -porphyria -an unusual or allergic reaction to acetaminophen, butalbital or other barbiturates, caffeine, other medicines, foods, dyes, or preservatives -pregnant or trying to get pregnant -breast-feeding How should I use this medicine? Take this medicine by mouth with a full glass of water. Follow the directions on the prescription label. If the medicine upsets your stomach, take the medicine with food or milk. Do not take more than you are told to take. Talk to your pediatrician regarding the use of this medicine in children. Special care may be needed. Overdosage: If you think you have taken too much of this medicine contact a poison control center or emergency room at once. NOTE: This medicine is only for you. Do not share this medicine with others. What if I miss a dose? If you miss a dose, take it as soon as you can. If it is almost time for your next dose, take only that dose. Do not take double or extra doses. What may interact with this medicine? -alcohol  or medicines that contain alcohol -antidepressants, especially MAOIs like isocarboxazid, phenelzine, tranylcypromine, and selegiline -antihistamines -benzodiazepines -carbamazepine -isoniazid -medicines for pain like pentazocine, buprenorphine, butorphanol, nalbuphine, tramadol, and propoxyphene -muscle relaxants -naltrexone -phenobarbital, phenytoin, and fosphenytoin -phenothiazines like perphenazine, thioridazine, chlorpromazine, mesoridazine, fluphenazine, prochlorperazine, promazine, and trifluoperazine -voriconazole This list may not describe all possible interactions. Give your health care provider a list of all the medicines, herbs, non-prescription drugs, or dietary supplements you use. Also tell them if you smoke, drink alcohol, or use illegal drugs. Some items may interact with your medicine. What should I watch for while using this medicine? Tell your doctor or health care professional if your pain does not go away, if it gets worse, or if you have new or a different type of pain. You may develop tolerance to the medicine. Tolerance means that you will need a higher dose of the medicine for pain relief. Tolerance is normal and is expected if you take the medicine for a long time. Do not suddenly stop taking your medicine because you may develop a severe reaction. Your body becomes used to the medicine. This does NOT mean you are addicted. Addiction is a behavior related to getting and using a drug for a non-medical reason. If you have pain, you have a medical reason to take pain medicine. Your doctor will tell you how much medicine to take. If your doctor wants you to stop the medicine, the dose will be slowly lowered over time to avoid any side effects. You may get drowsy or dizzy when you first start taking the medicine or  change doses. Do not drive, use machinery, or do anything that may be dangerous until you know how the medicine affects you. Stand or sit up slowly. Do not take other  medicines that contain acetaminophen with this medicine. Always read labels carefully. If you have questions, ask your doctor or pharmacist. If you take too much acetaminophen get medical help right away. Too much acetaminophen can be very dangerous and cause liver damage. Even if you do not have symptoms, it is important to get help right away. What side effects may I notice from receiving this medicine? Side effects that you should report to your doctor or health care professional as soon as possible: -allergic reactions like skin rash, itching or hives, swelling of the face, lips, or tongue -breathing problems -confusion -feeling faint or lightheaded, falls -redness, blistering, peeling or loosening of the skin, including inside the mouth -seizure -stomach pain -yellowing of the eyes or skin Side effects that usually do not require medical attention (report to your doctor or health care professional if they continue or are bothersome): -constipation -nausea, vomiting This list may not describe all possible side effects. Call your doctor for medical advice about side effects. You may report side effects to FDA at 1-800-FDA-1088. Where should I keep my medicine? Keep out of the reach of children. This medicine can be abused. Keep your medicine in a safe place to protect it from theft. Do not share this medicine with anyone. Selling or giving away this medicine is dangerous and against the law. This medicine may cause accidental overdose and death if it taken by other adults, children, or pets. Mix any unused medicine with a substance like cat litter or coffee grounds. Then throw the medicine away in a sealed container like a sealed bag or a coffee can with a lid. Do not use the medicine after the expiration date. Store at room temperature between 15 and 30 degrees C (59 and 86 degrees F). NOTE: This sheet is a summary. It may not cover all possible information. If you have questions about this  medicine, talk to your doctor, pharmacist, or health care provider.    2016, Elsevier/Gold Standard. (2013-10-07 15:00:25)    IF you received an x-ray today, you will receive an invoice from [facility]. Please contact [facility] at [phone] with questions or concerns regarding your invoice.   IF you received labwork today, you will receive an invoice from [employer]. Please contact [facility] at [phone] with questions or concerns regarding your invoice.   Our billing staff will not be able to assist you with questions regarding bills from these companies.  You will be contacted with the lab results as soon as they are available. The fastest way to get your results is to activate your My Chart account. Instructions are located on the last page of this paperwork. If you have not heard from [place] regarding the results in 2 weeks, please contact this office.     We recommend that you schedule a mammogram for breast cancer screening. Typically, you do not need a referral to do this. Please contact a local imaging center to schedule your mammogram.  [facility] - [phone]  *ask for the Radiology [facility] - [phone] or [phone]  [facility] - [phone] [place] [phone] [facility] - [phone]  *ask for the [facility] - [phone]  *ask for  the Radiology Department [facility] - [phone]  *ask for the [facility] - [phone]

## 2016-01-29 NOTE — Progress Notes (Signed)
Subjective:    Patient ID: Jennifer [name], female    DOB: 1956-08-24, 60 y.o.   MRN: [id number]  HPI This is a pleasant 60 yo female who presents today for follow up of UTI and HTN.  She brings a log of daily blood pressure readings with range from 108-155/52-104.  She is on amlodipine 5 mg and HCTZ 25 mg. She is also taking meloxicam 15 mg po qd for back pain.   She was diagnosed with a UTI with a telehealth visit and started on Macrobid 01/24/16. She has noted improvement in symptoms but not complete clearing of frequency. She works long hours at [employer] and rarely drinks liquids because it is difficult to get a bathroom break.   Past Medical History  Diagnosis Date  . Hypertension   . Migraine    Past Surgical History  Procedure Laterality Date  . Laparoscopic appendectomy  07/15/2012    Procedure: APPENDECTOMY LAPAROSCOPIC;  Surgeon: Zenovia Jarred, MD;  Location: [facility];  Service: General;  Laterality: N/A;  Laparoscopic Appendectomy  . Appendectomy     Family History  Problem Relation Age of Onset  . Adopted: Yes   Social History  Substance Use Topics  . Smoking status: Never Smoker   . Smokeless tobacco: None  . Alcohol Use: Yes     Comment: 2 drinks once every 2-3 months    Review of Systems No fever or chills, no change in back pain, no abdominal pain, nausea or vomiting    Objective:   Physical Exam Physical Exam  Constitutional: Oriented to person, place, and time. She appears well-developed and well-nourished.  HENT:  Head: Normocephalic and atraumatic.  Eyes: Conjunctivae are normal.  Neck: Normal range of motion. Neck supple.  Cardiovascular: Normal rate, regular rhythm and normal heart sounds.   Pulmonary/Chest: Effort normal and breath sounds normal. No CVA tenderness.  Musculoskeletal: Normal range of motion.  Neurological: Alert and oriented to person, place, and time.  Skin: Skin is warm and dry.  Psychiatric: Normal mood and affect. Behavior is  normal. Judgment and thought content normal.  Vitals reviewed.     BP 157/82 mmHg  Pulse 87  Temp(Src) 98.2 F (36.8 C) (Oral)  Resp 18  Ht 5' 2.5" (1.588 m)  Wt 160 lb 6.4 oz (72.757 kg)  BMI 28.85 kg/m2  SpO2 98% Wt Readings from Last 3 Encounters:  01/29/16 160 lb 6.4 oz (72.757 kg)  10/11/15 152 lb 3.2 oz (69.037 kg)  07/11/15 154 lb (69.854 kg)   Results for orders placed or performed in visit on 01/29/16  POCT urinalysis dipstick  Result Value Ref Range   Color, UA yellow yellow   Clarity, UA clear clear   Glucose, UA negative negative   Bilirubin, UA negative negative   Ketones, POC UA negative negative   Spec Grav, UA 1.020    Blood, UA negative negative   pH, UA 7.0    Protein Ur, POC negative negative   Urobilinogen, UA 0.2    Nitrite, UA Negative Negative   Leukocytes, UA Negative Negative  POCT Microscopic Urinalysis ([facility])  Result Value Ref Range   WBC,UR,HPF,POC None None WBC/hpf   RBC,UR,HPF,POC None None RBC/hpf   Bacteria None None, Too numerous to count   Mucus Absent Absent   Epithelial Cells, UR Per Microscopy None None, Too numerous to count cells/hpf       Assessment & Plan:  1. Painful urination - this has resolved with macrobid  2.  Essential hypertension - will increase her amlodipine from 5 mg - 10 mg - she will continue to take her blood pressures at home and keep a log - amLODipine (NORVASC) 10 MG tablet; Take 1 tablet (10 mg total) by mouth daily.  Dispense: 90 tablet; Refill: 0 - CBC - Lipid panel - COMPLETE METABOLIC PANEL WITH GFR - strongly encouraged her to decrease her meloxicam intake to prn, 1-2 times per week instead of daily  3. Hyperlipidemia - Lipid panel - COMPLETE METABOLIC PANEL WITH GFR  4. Other migraine without status migrainosus, not intractable - she is interested in accupuncture and I provided the number to [facility] - butalbital-acetaminophen-caffeine (FIORICET) 50-325-40 MG tablet; Take 1-2  tablets by mouth every 6 (six) hours as needed for headache.  Dispense: 20 tablet; Refill: 0  5. Urinary frequency - urine look clear except for SG on high side, discussed importance of hydrating and encouraged her to drink 6-8 glasses of water daily to help with sensation of frequency - POCT urinalysis dipstick - POCT Microscopic Urinalysis ([facility]) - follow up in 3 months  Clarene Reamer, FNP-BC  [facility]  01/31/2016 9:07 AM

## 2016-01-30 ENCOUNTER — Telehealth: Payer: Self-pay | Admitting: Emergency Medicine

## 2016-01-30 ENCOUNTER — Other Ambulatory Visit: Payer: Self-pay | Admitting: Family Medicine

## 2016-01-30 DIAGNOSIS — E785 Hyperlipidemia, unspecified: Secondary | ICD-10-CM

## 2016-01-30 MED ORDER — ATORVASTATIN CALCIUM 10 MG PO TABS: 10.0000 mg | ORAL_TABLET | Freq: Every day | ORAL | Status: DC

## 2016-01-30 NOTE — Telephone Encounter (Signed)
-----   Message from Elby Beck, [place] sent at 01/30/2016  4:46 PM EDT ----- Please call patient and tell her that her cholesterol levels are high and I have sent in a cholesterol lowering medication to her pharmacy. Increased exercise, weight loss and eliminating sugars, simple carbohydrates like white rice, potatoes, pasta and bread will help. Her kidney function test is mildly abnormal and I recommend she drink at least 6 glasses of water daily and limit her meloxicam use to twice a week. Please return in 6 months for fasting blood work.

## 2016-01-30 NOTE — Telephone Encounter (Signed)
Left message for pt to call back for lab results and medication instructions

## 2016-03-01 ENCOUNTER — Other Ambulatory Visit: Payer: Self-pay | Admitting: Urgent Care

## 2016-05-29 ENCOUNTER — Other Ambulatory Visit: Payer: Self-pay | Admitting: Urgent Care

## 2016-05-29 DIAGNOSIS — G43809 Other migraine, not intractable, without status migrainosus: Secondary | ICD-10-CM

## 2016-07-27 ENCOUNTER — Other Ambulatory Visit: Payer: Self-pay | Admitting: Urgent Care

## 2016-07-27 NOTE — Telephone Encounter (Signed)
01/2016 last ov and labs 

## 2016-07-29 ENCOUNTER — Other Ambulatory Visit: Payer: Self-pay

## 2016-07-29 MED ORDER — ATORVASTATIN CALCIUM 10 MG PO TABS: 10.0000 mg | ORAL_TABLET | Freq: Every day | ORAL | 0 refills | Status: DC

## 2016-08-11 ENCOUNTER — Other Ambulatory Visit: Payer: Self-pay | Admitting: Family Medicine

## 2016-08-19 ENCOUNTER — Ambulatory Visit (INDEPENDENT_AMBULATORY_CARE_PROVIDER_SITE_OTHER): Payer: BLUE CROSS/BLUE SHIELD | Admitting: Family Medicine

## 2016-08-19 ENCOUNTER — Ambulatory Visit: Payer: BLUE CROSS/BLUE SHIELD

## 2016-08-19 ENCOUNTER — Ambulatory Visit (INDEPENDENT_AMBULATORY_CARE_PROVIDER_SITE_OTHER): Payer: BLUE CROSS/BLUE SHIELD

## 2016-08-19 ENCOUNTER — Encounter: Payer: Self-pay | Admitting: Family Medicine

## 2016-08-19 VITALS — BP 135/80 | HR 88 | Temp 98.9°F | Resp 16 | Ht 62.5 in | Wt 157.4 lb

## 2016-08-19 DIAGNOSIS — E7800 Pure hypercholesterolemia, unspecified: Secondary | ICD-10-CM | POA: Diagnosis not present

## 2016-08-19 DIAGNOSIS — M545 Low back pain, unspecified: Secondary | ICD-10-CM

## 2016-08-19 DIAGNOSIS — G8929 Other chronic pain: Secondary | ICD-10-CM

## 2016-08-19 DIAGNOSIS — G43709 Chronic migraine without aura, not intractable, without status migrainosus: Secondary | ICD-10-CM

## 2016-08-19 DIAGNOSIS — G43809 Other migraine, not intractable, without status migrainosus: Secondary | ICD-10-CM

## 2016-08-19 DIAGNOSIS — Z114 Encounter for screening for human immunodeficiency virus [HIV]: Secondary | ICD-10-CM

## 2016-08-19 DIAGNOSIS — Z1159 Encounter for screening for other viral diseases: Secondary | ICD-10-CM | POA: Diagnosis not present

## 2016-08-19 DIAGNOSIS — M25562 Pain in left knee: Secondary | ICD-10-CM | POA: Diagnosis not present

## 2016-08-19 DIAGNOSIS — I10 Essential (primary) hypertension: Secondary | ICD-10-CM

## 2016-08-19 DIAGNOSIS — M25561 Pain in right knee: Secondary | ICD-10-CM

## 2016-08-19 DIAGNOSIS — Z1211 Encounter for screening for malignant neoplasm of colon: Secondary | ICD-10-CM | POA: Diagnosis not present

## 2016-08-19 LAB — POCT URINALYSIS DIP (MANUAL ENTRY)
Bilirubin, UA: NEGATIVE
Blood, UA: NEGATIVE
Glucose, UA: NEGATIVE
Ketones, POC UA: NEGATIVE
Nitrite, UA: NEGATIVE
Protein Ur, POC: NEGATIVE
Spec Grav, UA: 1.02
Urobilinogen, UA: 0.2
pH, UA: 6

## 2016-08-19 MED ORDER — TOPIRAMATE 50 MG PO TABS: 50.0000 mg | ORAL_TABLET | Freq: Every day | ORAL | 1 refills | Status: DC

## 2016-08-19 MED ORDER — TRAMADOL HCL 50 MG PO TABS: ORAL_TABLET | ORAL | 0 refills | Status: DC

## 2016-08-19 MED ORDER — ATORVASTATIN CALCIUM 10 MG PO TABS: 10.0000 mg | ORAL_TABLET | Freq: Every day | ORAL | 1 refills | Status: DC

## 2016-08-19 MED ORDER — MELOXICAM 15 MG PO TABS: ORAL_TABLET | ORAL | 1 refills | Status: DC

## 2016-08-19 MED ORDER — METHOCARBAMOL 500 MG PO TABS: 500.0000 mg | ORAL_TABLET | Freq: Three times a day (TID) | ORAL | 2 refills | Status: DC | PRN

## 2016-08-19 MED ORDER — AMLODIPINE BESYLATE 5 MG PO TABS: 5.0000 mg | ORAL_TABLET | Freq: Every day | ORAL | 1 refills | Status: DC

## 2016-08-19 MED ORDER — HYDROCHLOROTHIAZIDE 25 MG PO TABS: 25.0000 mg | ORAL_TABLET | Freq: Every day | ORAL | 1 refills | Status: DC

## 2016-08-19 NOTE — Patient Instructions (Signed)
     IF you received an x-ray today, you will receive an invoice from [facility]. Please contact [facility] at [phone] with questions or concerns regarding your invoice.   IF you received labwork today, you will receive an invoice from [facility]. Please contact [facility] at [phone] with questions or concerns regarding your invoice.   Our billing staff will not be able to assist you with questions regarding bills from these companies.  You will be contacted with the lab results as soon as they are available. The fastest way to get your results is to activate your My Chart account. Instructions are located on the last page of this paperwork. If you have not heard from us regarding the results in 2 weeks, please contact this office.     
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
## 2016-08-20 LAB — COMPREHENSIVE METABOLIC PANEL
ALT: 18 IU/L (ref 0–32)
AST: 19 IU/L (ref 0–40)
Albumin/Globulin Ratio: 2.5 — ABNORMAL HIGH (ref 1.2–2.2)
Albumin: 4.7 g/dL (ref 3.5–5.5)
Alkaline Phosphatase: 64 IU/L (ref 39–117)
BUN/Creatinine Ratio: 27 — ABNORMAL HIGH (ref 9–23)
BUN: 26 mg/dL — ABNORMAL HIGH (ref 6–24)
Bilirubin Total: <0.2 mg/dL (ref 0.0–1.2)
CO2: 23 mmol/L (ref 18–29)
Calcium: 9.5 mg/dL (ref 8.7–10.2)
Chloride: 106 mmol/L (ref 96–106)
Creatinine, Ser: 0.98 mg/dL (ref 0.57–1.00)
GFR calc Af Amer: 73 mL/min/{1.73_m2} (ref >59)
GFR calc non Af Amer: 63 mL/min/{1.73_m2} (ref >59)
Globulin, Total: 1.9 g/dL (ref 1.5–4.5)
Glucose: 87 mg/dL (ref 65–99)
Potassium: 3.4 mmol/L — ABNORMAL LOW (ref 3.5–5.2)
Sodium: 147 mmol/L — ABNORMAL HIGH (ref 134–144)
Total Protein: 6.6 g/dL (ref 6.0–8.5)

## 2016-08-20 LAB — LIPID PANEL
Chol/HDL Ratio: 3.5 ratio units (ref 0.0–4.4)
Cholesterol, Total: 185 mg/dL (ref 100–199)
HDL: 53 mg/dL (ref >39)
LDL Calculated: 104 mg/dL — ABNORMAL HIGH (ref 0–99)
Triglycerides: 142 mg/dL (ref 0–149)
VLDL Cholesterol Cal: 28 mg/dL (ref 5–40)

## 2016-08-20 LAB — TSH: TSH: 1.84 u[IU]/mL (ref 0.450–4.500)

## 2016-08-20 LAB — HEPATITIS C ANTIBODY: Hep C Virus Ab: <0.1 s/co ratio (ref 0.0–0.9)

## 2016-08-20 LAB — HIV ANTIBODY (ROUTINE TESTING W REFLEX): HIV Screen 4th Generation wRfx: NONREACTIVE

## 2016-08-24 ENCOUNTER — Other Ambulatory Visit: Payer: Self-pay | Admitting: Physician Assistant

## 2016-09-09 ENCOUNTER — Telehealth: Payer: Self-pay | Admitting: *Deleted

## 2016-09-09 NOTE — Telephone Encounter (Signed)
Patient called requesting her results from her test she had done on 08/19/16 visit patient is requesting for her results to be mailed to [place] [id number]

## 2016-09-13 NOTE — Telephone Encounter (Signed)
Please review labs and we will send labs/result letter

## 2016-09-17 ENCOUNTER — Encounter: Payer: Self-pay | Admitting: Family Medicine

## 2016-09-17 NOTE — Telephone Encounter (Signed)
Lab letter created and sent to lab pool for mailing.

## 2016-09-23 ENCOUNTER — Telehealth: Payer: Self-pay

## 2016-10-24 ENCOUNTER — Encounter: Payer: Self-pay | Admitting: Family Medicine

## 2017-02-11 ENCOUNTER — Other Ambulatory Visit: Payer: Self-pay | Admitting: Family Medicine

## 2017-02-11 DIAGNOSIS — G43809 Other migraine, not intractable, without status migrainosus: Secondary | ICD-10-CM

## 2017-02-11 NOTE — Telephone Encounter (Signed)
Call --- I only approved a 60 day supply of medications for patient as she is due for six month follow-up for high blood pressure and high cholesterol. Please schedule follow-up appointment with me within the upcoming 1-2 months.

## 2017-03-01 ENCOUNTER — Other Ambulatory Visit: Payer: Self-pay | Admitting: Family Medicine

## 2017-04-01 ENCOUNTER — Encounter: Payer: Self-pay | Admitting: Family Medicine

## 2017-04-01 ENCOUNTER — Ambulatory Visit (INDEPENDENT_AMBULATORY_CARE_PROVIDER_SITE_OTHER): Payer: BLUE CROSS/BLUE SHIELD | Admitting: Family Medicine

## 2017-04-01 VITALS — BP 124/76 | HR 75 | Temp 98.0°F | Resp 18 | Ht 62.6 in | Wt 158.0 lb

## 2017-04-01 DIAGNOSIS — M25561 Pain in right knee: Secondary | ICD-10-CM

## 2017-04-01 DIAGNOSIS — N3281 Overactive bladder: Secondary | ICD-10-CM

## 2017-04-01 DIAGNOSIS — Z0000 Encounter for general adult medical examination without abnormal findings: Secondary | ICD-10-CM

## 2017-04-01 DIAGNOSIS — M545 Low back pain: Secondary | ICD-10-CM

## 2017-04-01 DIAGNOSIS — G43809 Other migraine, not intractable, without status migrainosus: Secondary | ICD-10-CM | POA: Diagnosis not present

## 2017-04-01 DIAGNOSIS — Z131 Encounter for screening for diabetes mellitus: Secondary | ICD-10-CM | POA: Diagnosis not present

## 2017-04-01 DIAGNOSIS — G43709 Chronic migraine without aura, not intractable, without status migrainosus: Secondary | ICD-10-CM

## 2017-04-01 DIAGNOSIS — M25562 Pain in left knee: Secondary | ICD-10-CM

## 2017-04-01 DIAGNOSIS — Z1211 Encounter for screening for malignant neoplasm of colon: Secondary | ICD-10-CM | POA: Diagnosis not present

## 2017-04-01 DIAGNOSIS — Z124 Encounter for screening for malignant neoplasm of cervix: Secondary | ICD-10-CM | POA: Diagnosis not present

## 2017-04-01 DIAGNOSIS — Z1231 Encounter for screening mammogram for malignant neoplasm of breast: Secondary | ICD-10-CM | POA: Diagnosis not present

## 2017-04-01 DIAGNOSIS — E7800 Pure hypercholesterolemia, unspecified: Secondary | ICD-10-CM | POA: Diagnosis not present

## 2017-04-01 DIAGNOSIS — G8929 Other chronic pain: Secondary | ICD-10-CM

## 2017-04-01 DIAGNOSIS — I10 Essential (primary) hypertension: Secondary | ICD-10-CM | POA: Diagnosis not present

## 2017-04-01 DIAGNOSIS — Z1239 Encounter for other screening for malignant neoplasm of breast: Secondary | ICD-10-CM

## 2017-04-01 LAB — POCT URINALYSIS DIP (MANUAL ENTRY)
Bilirubin, UA: NEGATIVE
Blood, UA: NEGATIVE
Glucose, UA: NEGATIVE mg/dL
Ketones, POC UA: NEGATIVE mg/dL
Leukocytes, UA: NEGATIVE
Nitrite, UA: NEGATIVE
Protein Ur, POC: NEGATIVE mg/dL
Spec Grav, UA: 1.025 (ref 1.010–1.025)
Urobilinogen, UA: 0.2 E.U./dL
pH, UA: 5.5 (ref 5.0–8.0)

## 2017-04-01 MED ORDER — BUTALBITAL-ACETAMINOPHEN 50-325 MG PO TABS: 1.0000 | ORAL_TABLET | Freq: Three times a day (TID) | ORAL | 0 refills | Status: DC | PRN

## 2017-04-01 MED ORDER — TRAMADOL HCL 50 MG PO TABS: ORAL_TABLET | ORAL | 5 refills | Status: DC

## 2017-04-01 MED ORDER — HYDROCHLOROTHIAZIDE 25 MG PO TABS: 25.0000 mg | ORAL_TABLET | Freq: Every day | ORAL | 1 refills | Status: DC

## 2017-04-01 MED ORDER — ATORVASTATIN CALCIUM 10 MG PO TABS: 10.0000 mg | ORAL_TABLET | Freq: Every day | ORAL | 1 refills | Status: DC

## 2017-04-01 MED ORDER — TOPIRAMATE 50 MG PO TABS: 50.0000 mg | ORAL_TABLET | Freq: Two times a day (BID) | ORAL | 1 refills | Status: DC

## 2017-04-01 MED ORDER — AMLODIPINE BESYLATE 5 MG PO TABS: 5.0000 mg | ORAL_TABLET | Freq: Every day | ORAL | 1 refills | Status: DC

## 2017-04-01 NOTE — Progress Notes (Signed)
Subjective:    Patient ID: Jennifer [name], female    DOB: [birth year], 61 y.o.   MRN: [id number]  04/01/2017  Annual Exam   HPI This 62 y.o. female presents for Complete Physical Examination.  Last physical:  05/04/2013 Pap smear: unsure Mammogram:  unsure Colonoscopy:   Never; did not schedule last month due to missing work.   Eye exam: Dental exam:  BP Readings from Last 3 Encounters:  04/01/17 124/76  08/19/16 135/80  01/29/16 (!) 157/82   Wt Readings from Last 3 Encounters:  04/01/17 158 lb (71.7 kg)  08/19/16 157 lb 6.4 oz (71.4 kg)  01/29/16 160 lb 6.4 oz (72.8 kg)    There is no immunization history on file for this patient.   Visual Acuity Screening   Right eye Left eye Both eyes  Without correction:     With correction: 20/20 20/20 20/20      Review of Systems  Constitutional: Negative for activity change, appetite change, chills, diaphoresis, fatigue, fever and unexpected weight change.  HENT: Negative for congestion, dental problem, drooling, ear discharge, ear pain, facial swelling, hearing loss, mouth sores, nosebleeds, postnasal drip, rhinorrhea, sinus pressure, sneezing, sore throat, tinnitus, trouble swallowing and voice change.   Eyes: Negative for photophobia, pain, discharge, redness, itching and visual disturbance.  Respiratory: Negative for apnea, cough, choking, chest tightness, shortness of breath, wheezing and stridor.   Cardiovascular: Negative for chest pain, palpitations and leg swelling.  Gastrointestinal: Negative for abdominal distention, abdominal pain, anal bleeding, blood in stool, constipation, diarrhea, nausea, rectal pain and vomiting.  Endocrine: Negative for cold intolerance, heat intolerance, polydipsia, polyphagia and polyuria.  Genitourinary: Negative for decreased urine volume, difficulty urinating, dyspareunia, dysuria, enuresis, flank pain, frequency, genital sores, hematuria, menstrual problem, pelvic pain, urgency, vaginal  bleeding, vaginal discharge and vaginal pain.  Musculoskeletal: Negative for arthralgias, back pain, gait problem, joint swelling, myalgias, neck pain and neck stiffness.  Skin: Negative for color change, pallor, rash and wound.  Allergic/Immunologic: Negative for environmental allergies, food allergies and immunocompromised state.  Neurological: Negative for dizziness, tremors, seizures, syncope, facial asymmetry, speech difficulty, weakness, light-headedness, numbness and headaches.  Hematological: Negative for adenopathy. Does not bruise/bleed easily.  Psychiatric/Behavioral: Negative for agitation, behavioral problems, confusion, decreased concentration, dysphoric mood, hallucinations, self-injury, sleep disturbance and suicidal ideas. The patient is not nervous/anxious and is not hyperactive.     Past Medical History:  Diagnosis Date  . Hyperlipidemia   . Hypertension   . Migraine    Past Surgical History:  Procedure Laterality Date  . APPENDECTOMY    . LAPAROSCOPIC APPENDECTOMY  07/15/2012   Procedure: APPENDECTOMY LAPAROSCOPIC;  Surgeon: Zenovia Jarred, MD;  Location: [facility];  Service: General;  Laterality: N/A;  Laparoscopic Appendectomy   No Known Allergies  Social History   Social History  . Marital status: Widowed    Spouse name: N/A  . Number of children: 2  . Years of education: N/A   Occupational History  . 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 Smoking status: Never Smoker  . Smokeless tobacco: Never Used  . Alcohol use Yes     Comment: 2 drinks once every 2-3 months  . Drug use: No  . Sexual activity: Not on file   Other Topics Concern  . Not on file   Social History Narrative   Marital status: widowed since 2008; husband was bipolar and alcoholic      Children: 2  children (28, 66); son lives with patient.Daughter lives in [place]/autism/does not drive.  Daughter lives with mother-in-law.  One grandpuppy      Lives:  with son      Employment: works at [employer]; also works in [place].      Tobacco: none      Alcohol: none; husband was an alcoholic.      Drugs:   none      Exercise:   Family History  Problem Relation Age of Onset  . Adopted: Yes       Objective:    BP 124/76   Pulse 75   Temp 98 F (36.7 C) (Oral)   Resp 18   Ht 5' 2.6" (1.59 m)   Wt 158 lb (71.7 kg)   SpO2 98%   BMI 28.35 kg/m  Physical Exam  Constitutional: She is oriented to person, place, and time. She appears well-developed and well-nourished. No distress.  HENT:  Head: Normocephalic and atraumatic.  Right Ear: External ear normal.  Left Ear: External ear normal.  Nose: Nose normal.  Mouth/Throat: Oropharynx is clear and moist.  Eyes: Pupils are equal, round, and reactive to light. Conjunctivae and EOM are normal.  Neck: Normal range of motion and full passive range of motion without pain. Neck supple. No JVD present. Carotid bruit is not present. No thyromegaly present.  Cardiovascular: Normal rate, regular rhythm and normal heart sounds.  Exam reveals no gallop and no friction rub.   No murmur heard. Pulmonary/Chest: Effort normal and breath sounds normal. She has no wheezes. She has no rales. Right breast exhibits no inverted nipple, no mass, no nipple discharge, no skin change and no tenderness. Left breast exhibits no inverted nipple, no mass, no nipple discharge, no skin change and no tenderness. Breasts are symmetrical.  Abdominal: Soft. Bowel sounds are normal. She exhibits no distension and no mass. There is no tenderness. There is no rebound and no guarding.  Genitourinary: Uterus normal. There is no rash, tenderness, lesion or injury on the right labia. There is no rash, tenderness, lesion or injury on the left labia. Cervix exhibits no motion tenderness and no friability. Right adnexum displays no mass, no tenderness and no fullness. Left adnexum displays no mass, no tenderness and no  fullness. No erythema, tenderness or bleeding in the vagina. No foreign body in the vagina. No signs of injury around the vagina. No vaginal discharge found.  Musculoskeletal:       Right shoulder: Normal.       Left shoulder: Normal.       Cervical back: Normal.  Lymphadenopathy:    She has no cervical adenopathy.  Neurological: She is alert and oriented to person, place, and time. She has normal reflexes. No cranial nerve deficit. She exhibits normal muscle tone. Coordination normal.  Skin: Skin is warm and dry. No rash noted. She is not diaphoretic. No erythema. No pallor.  Psychiatric: She has a normal mood and affect. Her behavior is normal. Judgment and thought content normal.  Nursing note and vitals reviewed.  Results for orders placed or performed in visit on 04/01/17  POCT urinalysis dipstick  Result Value Ref Range   Color, UA yellow yellow   Clarity, UA clear clear   Glucose, UA negative negative mg/dL   Bilirubin, UA negative negative   Ketones, POC UA negative negative mg/dL   Spec Grav, UA 1.025 1.010 - 1.025   Blood, UA negative negative   pH, UA 5.5 5.0 -  8.0   Protein Ur, POC negative negative mg/dL   Urobilinogen, UA 0.2 0.2 or 1.0 E.U./dL   Nitrite, UA Negative Negative   Leukocytes, UA Negative Negative       Assessment & Plan:   1. Routine physical examination   2. Essential hypertension   3. Chronic migraine without aura without status migrainosus, not intractable   4. Bilateral chronic knee pain   5. Chronic bilateral low back pain without sciatica   6. Pure hypercholesterolemia   7. Screening for breast cancer   8. Colon cancer screening   9. Screening for diabetes mellitus   10. Cervical cancer screening   11. Other migraine without status migrainosus, not intractable   12. Overactive bladder    -anticipatory guidance provided --- exercise, weight loss, safe driving practices, aspirin 81mg  daily. -obtain age appropriate screening labs and labs  for chronic disease management. -new onset overactive bladder; obtain urine culture to rule out UTI as cause of recent symptoms. -recommend weight loss, exercise for 30-60 minutes five days per week; recommend 1200 kcal restriction per day with a minimum of 60 grams of protein per day.   Orders Placed This Encounter  Procedures  . Urine Culture  . MM DIGITAL SCREENING BILATERAL    Standing Status:   Future    Standing Expiration Date:   06/01/2018    Order Specific Question:   Reason for Exam (SYMPTOM  OR DIAGNOSIS REQUIRED)    Answer:   screening for breast cancer    Order Specific Question:   Is the patient pregnant?    Answer:   No    Order Specific Question:   Preferred imaging location?    Answer:   [facility]  . CBC with Differential/Platelet  . Comprehensive metabolic panel    Order Specific Question:   Has the patient fasted?    Answer:   Yes  . Hemoglobin A1c  . Lipid panel    Order Specific Question:   Has the patient fasted?    Answer:   Yes  . TSH  . Ambulatory referral to Gastroenterology    Referral Priority:   Routine    Referral Type:   Consultation    Referral Reason:   Specialty Services Required    Number of Visits Requested:   1  . POCT urinalysis dipstick   Meds ordered this encounter  Medications  . DISCONTD: ACETAMINOPHEN-BUTALBITAL 50-325 MG TABS    Sig: Take by mouth.  Marland Kitchen amLODipine (NORVASC) 5 MG tablet    Sig: Take 1 tablet (5 mg total) by mouth daily.    Dispense:  90 tablet    Refill:  1  . atorvastatin (LIPITOR) 10 MG tablet    Sig: Take 1 tablet (10 mg total) by mouth daily.    Dispense:  90 tablet    Refill:  1  . topiramate (TOPAMAX) 50 MG tablet    Sig: Take 1 tablet (50 mg total) by mouth 2 (two) times daily.    Dispense:  180 tablet    Refill:  1  . hydrochlorothiazide (HYDRODIURIL) 25 MG tablet    Sig: Take 1 tablet (25 mg total) by mouth daily.    Dispense:  90 tablet    Refill:  1  . ACETAMINOPHEN-BUTALBITAL 50-325 MG  TABS    Sig: Take 1-2 tablets by mouth every 8 (eight) hours as needed.    Dispense:  45 each    Refill:  0  . traMADol (ULTRAM) 50 MG tablet  Sig: Take 1 tablet at bedtime for knee and back pain.    Dispense:  30 tablet    Refill:  5    Return in about 6 months (around 10/02/2017) for recheck high blood pressure, high cholesterol, migraines.   Sami Roes Elayne Guerin, M.D. Primary Care at [facility] [address] [place], [place]  [address] [phone] phone [phone] fax

## 2017-04-01 NOTE — Patient Instructions (Addendum)
IF you received an x-ray today, you will receive an invoice from [facility]. Please contact [facility] at [phone] with questions or concerns regarding your invoice.   IF you received labwork today, you will receive an invoice from [facility]. Please contact [facility] at [phone] with questions or concerns regarding your invoice.   Our billing staff will not be able to assist you with questions regarding bills from these companies.  You will be contacted with the lab results as soon as they are available. The fastest way to get your results is to activate your My Chart account. Instructions are located on the last page of this paperwork. If you have not heard from [place] regarding the results in 2 weeks, please contact this office.      Preventive Care 40-64 Years, Female Preventive care refers to lifestyle choices and visits with your health care provider that can promote health and wellness. What does preventive care include?  A yearly physical exam. This is also called an annual well check.  Dental exams once or twice a year.  Routine eye exams. Ask your health care provider how often you should have your eyes checked.  Personal lifestyle choices, including: ? Daily care of your teeth and gums. ? Regular physical activity. ? Eating a healthy diet. ? Avoiding tobacco and drug use. ? Limiting alcohol use. ? Practicing safe sex. ? Taking low-dose aspirin daily starting at age 34. ? Taking vitamin and mineral supplements as recommended by your health care provider. What happens during an annual well check? The services and screenings done by your health care provider during your annual well check will depend on your age, overall health, lifestyle risk factors, and family history of disease. Counseling Your health care provider may ask you questions about your:  Alcohol use.  Tobacco use.  Drug use.  Emotional well-being.  Home and relationship  well-being.  Sexual activity.  Eating habits.  Work and work Statistician.  Method of birth control.  Menstrual cycle.  Pregnancy history.  Screening You may have the following tests or measurements:  Height, weight, and BMI.  Blood pressure.  Lipid and cholesterol levels. These may be checked every 5 years, or more frequently if you are over 65 years old.  Skin check.  Lung cancer screening. You may have this screening every year starting at age 102 if you have a 30-pack-year history of smoking and currently smoke or have quit within the past 15 years.  Fecal occult blood test (FOBT) of the stool. You may have this test every year starting at age 33.  Flexible sigmoidoscopy or colonoscopy. You may have a sigmoidoscopy every 5 years or a colonoscopy every 10 years starting at age 23.  Hepatitis C blood test.  Hepatitis B blood test.  Sexually transmitted disease (STD) testing.  Diabetes screening. This is done by checking your blood sugar (glucose) after you have not eaten for a while (fasting). You may have this done every 1-3 years.  Mammogram. This may be done every 1-2 years. Talk to your health care provider about when you should start having regular mammograms. This may depend on whether you have a family history of breast cancer.  BRCA-related cancer screening. This may be done if you have a family history of breast, ovarian, tubal, or peritoneal cancers.  Pelvic exam and Pap test. This may be done every 3 years starting at age 3. Starting at age 55, this may be done every 5 years if  you have a Pap test in combination with an HPV test.  Bone density scan. This is done to screen for osteoporosis. You may have this scan if you are at high risk for osteoporosis.  Discuss your test results, treatment options, and if necessary, the need for more tests with your health care provider. Vaccines Your health care provider may recommend certain vaccines, such  as:  Influenza vaccine. This is recommended every year.  Tetanus, diphtheria, and acellular pertussis (Tdap, Td) vaccine. You may need a Td booster every 10 years.  Varicella vaccine. You may need this if you have not been vaccinated.  Zoster vaccine. You may need this after age 70.  Measles, mumps, and rubella (MMR) vaccine. You may need at least one dose of MMR if you were born in 1957 or later. You may also need a second dose.  Pneumococcal 13-valent conjugate (PCV13) vaccine. You may need this if you have certain conditions and were not previously vaccinated.  Pneumococcal polysaccharide (PPSV23) vaccine. You may need one or two doses if you smoke cigarettes or if you have certain conditions.  Meningococcal vaccine. You may need this if you have certain conditions.  Hepatitis A vaccine. You may need this if you have certain conditions or if you travel or work in places where you may be exposed to hepatitis A.  Hepatitis B vaccine. You may need this if you have certain conditions or if you travel or work in places where you may be exposed to hepatitis B.  Haemophilus influenzae type b (Hib) vaccine. You may need this if you have certain conditions.  Talk to your health care provider about which screenings and vaccines you need and how often you need them. This information is not intended to replace advice given to you by your health care provider. Make sure you discuss any questions you have with your health care provider. Document Released: 09/07/2015 Document Revised: 04/30/2016 Document Reviewed: 06/12/2015 Elsevier Interactive Patient Education  2017 [employer].

## 2017-04-01 NOTE — Progress Notes (Signed)
   Subjective:    Patient ID: Jennifer [name], female    DOB: [birth year], 61 y.o.   MRN: [id number]  HPI    Review of Systems  HENT: Positive for hearing loss and tinnitus.   Genitourinary: Positive for frequency.  Neurological: Positive for headaches.       Objective:   Physical Exam        Assessment & Plan:

## 2017-04-02 LAB — LIPID PANEL
Chol/HDL Ratio: 4 ratio (ref 0.0–4.4)
Cholesterol, Total: 178 mg/dL (ref 100–199)
HDL: 45 mg/dL (ref >39)
LDL Calculated: 112 mg/dL — ABNORMAL HIGH (ref 0–99)
Triglycerides: 104 mg/dL (ref 0–149)
VLDL Cholesterol Cal: 21 mg/dL (ref 5–40)

## 2017-04-02 LAB — CBC WITH DIFFERENTIAL/PLATELET
Basophils Absolute: 0 10*3/uL (ref 0.0–0.2)
Basos: 1 %
EOS (ABSOLUTE): 0.2 10*3/uL (ref 0.0–0.4)
Eos: 3 %
Hematocrit: 40.7 % (ref 34.0–46.6)
Hemoglobin: 13.9 g/dL (ref 11.1–15.9)
Immature Grans (Abs): 0 10*3/uL (ref 0.0–0.1)
Immature Granulocytes: 0 %
Lymphocytes Absolute: 1.4 10*3/uL (ref 0.7–3.1)
Lymphs: 19 %
MCH: 31.7 pg (ref 26.6–33.0)
MCHC: 34.2 g/dL (ref 31.5–35.7)
MCV: 93 fL (ref 79–97)
Monocytes Absolute: 0.6 10*3/uL (ref 0.1–0.9)
Monocytes: 8 %
Neutrophils Absolute: 5.3 10*3/uL (ref 1.4–7.0)
Neutrophils: 69 %
Platelets: 286 10*3/uL (ref 150–379)
RBC: 4.39 x10E6/uL (ref 3.77–5.28)
RDW: 13.2 % (ref 12.3–15.4)
WBC: 7.6 10*3/uL (ref 3.4–10.8)

## 2017-04-02 LAB — COMPREHENSIVE METABOLIC PANEL
ALT: 12 IU/L (ref 0–32)
AST: 19 IU/L (ref 0–40)
Albumin/Globulin Ratio: 2 (ref 1.2–2.2)
Albumin: 4.5 g/dL (ref 3.6–4.8)
Alkaline Phosphatase: 66 IU/L (ref 39–117)
BUN/Creatinine Ratio: 22 (ref 12–28)
BUN: 26 mg/dL (ref 8–27)
Bilirubin Total: 0.2 mg/dL (ref 0.0–1.2)
CO2: 24 mmol/L (ref 20–29)
Calcium: 9.3 mg/dL (ref 8.7–10.3)
Chloride: 104 mmol/L (ref 96–106)
Creatinine, Ser: 1.18 mg/dL — ABNORMAL HIGH (ref 0.57–1.00)
GFR calc Af Amer: 58 mL/min/{1.73_m2} — ABNORMAL LOW (ref >59)
GFR calc non Af Amer: 50 mL/min/{1.73_m2} — ABNORMAL LOW (ref >59)
Globulin, Total: 2.2 g/dL (ref 1.5–4.5)
Glucose: 103 mg/dL — ABNORMAL HIGH (ref 65–99)
Potassium: 3.4 mmol/L — ABNORMAL LOW (ref 3.5–5.2)
Sodium: 143 mmol/L (ref 134–144)
Total Protein: 6.7 g/dL (ref 6.0–8.5)

## 2017-04-02 LAB — TSH: TSH: 2.83 u[IU]/mL (ref 0.450–4.500)

## 2017-04-02 LAB — HEMOGLOBIN A1C
Est. average glucose Bld gHb Est-mCnc: 100 mg/dL
Hgb A1c MFr Bld: 5.1 % (ref 4.8–5.6)

## 2017-04-02 LAB — URINE CULTURE: Organism ID, Bacteria: NO GROWTH

## 2017-04-03 LAB — PAP IG, CT-NG NAA, HPV HIGH-RISK
Chlamydia, Nuc. Acid Amp: NEGATIVE
Gonococcus by Nucleic Acid Amp: NEGATIVE
HPV, high-risk: NEGATIVE
PAP Smear Comment: 0

## 2017-04-14 ENCOUNTER — Other Ambulatory Visit: Payer: Self-pay | Admitting: Family Medicine

## 2017-04-14 DIAGNOSIS — G43809 Other migraine, not intractable, without status migrainosus: Secondary | ICD-10-CM

## 2017-05-08 ENCOUNTER — Encounter: Payer: Self-pay | Admitting: Family Medicine

## 2017-05-12 ENCOUNTER — Other Ambulatory Visit: Payer: Self-pay | Admitting: Family Medicine

## 2017-05-12 DIAGNOSIS — Z1231 Encounter for screening mammogram for malignant neoplasm of breast: Secondary | ICD-10-CM

## 2017-05-26 ENCOUNTER — Ambulatory Visit
Admission: RE | Admit: 2017-05-26 | Discharge: 2017-05-26 | Disposition: A | Discharge status: Home / Self Care | Payer: BLUE CROSS/BLUE SHIELD | Source: Ambulatory Visit | Attending: Family Medicine | Admitting: Family Medicine

## 2017-05-26 DIAGNOSIS — Z1231 Encounter for screening mammogram for malignant neoplasm of breast: Secondary | ICD-10-CM

## 2017-07-08 ENCOUNTER — Telehealth: Payer: Self-pay | Admitting: Family Medicine

## 2017-07-08 NOTE — Telephone Encounter (Signed)
Copied from [place] [id number]. Topic: General - Other >> Jul 08, 2017 10:04 AM Darl Householder, RMA wrote: Reason for CRM: patient is having recurrent UTI symptoms and was seen on 07/02/2017 by Dr. Tamala Julian, pt has a UTI and would like to have an antibiotic prescribed, [facility] to [facility] [address], please call pt if possible at [phone], pt is experiencing frequent urination and burning , pt feels like bladder is consistently full

## 2017-07-09 NOTE — Telephone Encounter (Signed)
Patient will need to be seen for symptoms before antibiotic can be called in.

## 2017-09-24 ENCOUNTER — Other Ambulatory Visit: Payer: Self-pay | Admitting: Family Medicine

## 2017-09-24 DIAGNOSIS — G43809 Other migraine, not intractable, without status migrainosus: Secondary | ICD-10-CM

## 2017-09-24 NOTE — Telephone Encounter (Signed)
Topamax refill Last OV: 04/01/17 Last Refill:04/15/17 Pharmacy:[facility] [address] Please Advise

## 2017-09-24 NOTE — Telephone Encounter (Signed)
Topamax refill Last OV: 04/01/17 Last Refill:04/15/17 Pharmacy:[facility] [address]

## 2017-09-30 ENCOUNTER — Other Ambulatory Visit: Payer: Self-pay | Admitting: Family Medicine

## 2017-10-07 ENCOUNTER — Ambulatory Visit: Payer: BLUE CROSS/BLUE SHIELD | Admitting: Family Medicine

## 2017-11-23 ENCOUNTER — Encounter: Payer: Self-pay | Admitting: Family Medicine

## 2017-11-23 ENCOUNTER — Other Ambulatory Visit: Payer: Self-pay

## 2017-11-23 ENCOUNTER — Ambulatory Visit: Payer: BLUE CROSS/BLUE SHIELD | Admitting: Family Medicine

## 2017-11-23 VITALS — BP 140/80 | HR 122 | Temp 98.0°F | Resp 16 | Ht 62.99 in | Wt 158.6 lb

## 2017-11-23 DIAGNOSIS — M545 Low back pain: Secondary | ICD-10-CM

## 2017-11-23 DIAGNOSIS — R000 Tachycardia, unspecified: Secondary | ICD-10-CM | POA: Diagnosis not present

## 2017-11-23 DIAGNOSIS — E7800 Pure hypercholesterolemia, unspecified: Secondary | ICD-10-CM | POA: Diagnosis not present

## 2017-11-23 DIAGNOSIS — I10 Essential (primary) hypertension: Secondary | ICD-10-CM

## 2017-11-23 DIAGNOSIS — Z6828 Body mass index (BMI) 28.0-28.9, adult: Secondary | ICD-10-CM | POA: Diagnosis not present

## 2017-11-23 DIAGNOSIS — F430 Acute stress reaction: Secondary | ICD-10-CM | POA: Diagnosis not present

## 2017-11-23 DIAGNOSIS — G43709 Chronic migraine without aura, not intractable, without status migrainosus: Secondary | ICD-10-CM

## 2017-11-23 DIAGNOSIS — G8929 Other chronic pain: Secondary | ICD-10-CM | POA: Diagnosis not present

## 2017-11-23 DIAGNOSIS — J0100 Acute maxillary sinusitis, unspecified: Secondary | ICD-10-CM

## 2017-11-23 LAB — POCT URINALYSIS DIP (MANUAL ENTRY)
Bilirubin, UA: NEGATIVE
Blood, UA: NEGATIVE
Glucose, UA: NEGATIVE mg/dL
Ketones, POC UA: NEGATIVE mg/dL
Leukocytes, UA: NEGATIVE
Nitrite, UA: NEGATIVE
Protein Ur, POC: NEGATIVE mg/dL
Spec Grav, UA: >=1.03 — AB (ref 1.010–1.025)
Urobilinogen, UA: 0.2 E.U./dL
pH, UA: 5 (ref 5.0–8.0)

## 2017-11-23 MED ORDER — PREDNISONE 20 MG PO TABS: ORAL_TABLET | ORAL | 0 refills | Status: DC

## 2017-11-23 MED ORDER — AMOXICILLIN 500 MG PO TABS: 1000.0000 mg | ORAL_TABLET | Freq: Two times a day (BID) | ORAL | 0 refills | Status: DC

## 2017-11-23 MED ORDER — FLUTICASONE PROPIONATE 50 MCG/ACT NA SUSP: 2.0000 | Freq: Every day | NASAL | 12 refills | Status: DC

## 2017-11-23 NOTE — Patient Instructions (Signed)
     IF you received an x-ray today, you will receive an invoice from [facility]. Please contact [facility] at [phone] with questions or concerns regarding your invoice.   IF you received labwork today, you will receive an invoice from [facility]. Please contact [facility] at [phone] with questions or concerns regarding your invoice.   Our billing staff will not be able to assist you with questions regarding bills from these companies.  You will be contacted with the lab results as soon as they are available. The fastest way to get your results is to activate your My Chart account. Instructions are located on the last page of this paperwork. If you have not heard from us regarding the results in 2 weeks, please contact this office.     
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
## 2017-11-24 LAB — CBC WITH DIFFERENTIAL/PLATELET
Basophils Absolute: 0.1 10*3/uL (ref 0.0–0.2)
Basos: 1 %
EOS (ABSOLUTE): 0.2 10*3/uL (ref 0.0–0.4)
Eos: 2 %
Hematocrit: 41.1 % (ref 34.0–46.6)
Hemoglobin: 14.1 g/dL (ref 11.1–15.9)
Immature Grans (Abs): 0.1 10*3/uL (ref 0.0–0.1)
Immature Granulocytes: 1 %
Lymphocytes Absolute: 1.4 10*3/uL (ref 0.7–3.1)
Lymphs: 15 %
MCH: 31.7 pg (ref 26.6–33.0)
MCHC: 34.3 g/dL (ref 31.5–35.7)
MCV: 92 fL (ref 79–97)
Monocytes Absolute: 0.6 10*3/uL (ref 0.1–0.9)
Monocytes: 6 %
Neutrophils Absolute: 7.2 10*3/uL — ABNORMAL HIGH (ref 1.4–7.0)
Neutrophils: 75 %
Platelets: 316 10*3/uL (ref 150–379)
RBC: 4.45 x10E6/uL (ref 3.77–5.28)
RDW: 13.5 % (ref 12.3–15.4)
WBC: 9.5 10*3/uL (ref 3.4–10.8)

## 2017-11-24 LAB — COMPREHENSIVE METABOLIC PANEL
ALT: 31 IU/L (ref 0–32)
AST: 28 IU/L (ref 0–40)
Albumin/Globulin Ratio: 2.1 (ref 1.2–2.2)
Albumin: 4.7 g/dL (ref 3.6–4.8)
Alkaline Phosphatase: 69 IU/L (ref 39–117)
BUN/Creatinine Ratio: 21 (ref 12–28)
BUN: 24 mg/dL (ref 8–27)
Bilirubin Total: 0.2 mg/dL (ref 0.0–1.2)
CO2: 18 mmol/L — ABNORMAL LOW (ref 20–29)
Calcium: 10.1 mg/dL (ref 8.7–10.3)
Chloride: 109 mmol/L — ABNORMAL HIGH (ref 96–106)
Creatinine, Ser: 1.16 mg/dL — ABNORMAL HIGH (ref 0.57–1.00)
GFR calc Af Amer: 59 mL/min/{1.73_m2} — ABNORMAL LOW (ref >59)
GFR calc non Af Amer: 51 mL/min/{1.73_m2} — ABNORMAL LOW (ref >59)
Globulin, Total: 2.2 g/dL (ref 1.5–4.5)
Glucose: 109 mg/dL — ABNORMAL HIGH (ref 65–99)
Potassium: 4.5 mmol/L (ref 3.5–5.2)
Sodium: 149 mmol/L — ABNORMAL HIGH (ref 134–144)
Total Protein: 6.9 g/dL (ref 6.0–8.5)

## 2017-11-24 LAB — LIPID PANEL
Chol/HDL Ratio: 3.8 ratio (ref 0.0–4.4)
Cholesterol, Total: 188 mg/dL (ref 100–199)
HDL: 50 mg/dL (ref >39)
LDL Calculated: 108 mg/dL — ABNORMAL HIGH (ref 0–99)
Triglycerides: 148 mg/dL (ref 0–149)
VLDL Cholesterol Cal: 30 mg/dL (ref 5–40)

## 2017-11-24 LAB — T4, FREE: Free T4: 0.91 ng/dL (ref 0.82–1.77)

## 2017-11-24 LAB — TSH: TSH: 3.32 u[IU]/mL (ref 0.450–4.500)

## 2017-11-24 MED ORDER — AMLODIPINE BESYLATE 5 MG PO TABS: 5.0000 mg | ORAL_TABLET | Freq: Every day | ORAL | 1 refills | Status: DC

## 2017-11-24 MED ORDER — TOPIRAMATE 50 MG PO TABS: 50.0000 mg | ORAL_TABLET | Freq: Two times a day (BID) | ORAL | 1 refills | Status: DC

## 2017-12-14 ENCOUNTER — Other Ambulatory Visit: Payer: Self-pay

## 2017-12-14 ENCOUNTER — Ambulatory Visit: Payer: BLUE CROSS/BLUE SHIELD | Admitting: Family Medicine

## 2017-12-14 ENCOUNTER — Encounter: Payer: Self-pay | Admitting: Family Medicine

## 2017-12-14 VITALS — BP 138/72 | HR 106 | Temp 98.0°F | Resp 16 | Wt 161.0 lb

## 2017-12-14 DIAGNOSIS — R350 Frequency of micturition: Secondary | ICD-10-CM | POA: Diagnosis not present

## 2017-12-14 DIAGNOSIS — I10 Essential (primary) hypertension: Secondary | ICD-10-CM | POA: Diagnosis not present

## 2017-12-14 DIAGNOSIS — M25561 Pain in right knee: Secondary | ICD-10-CM | POA: Diagnosis not present

## 2017-12-14 DIAGNOSIS — J301 Allergic rhinitis due to pollen: Secondary | ICD-10-CM | POA: Diagnosis not present

## 2017-12-14 DIAGNOSIS — M545 Low back pain, unspecified: Secondary | ICD-10-CM

## 2017-12-14 DIAGNOSIS — M25562 Pain in left knee: Secondary | ICD-10-CM | POA: Diagnosis not present

## 2017-12-14 DIAGNOSIS — G8929 Other chronic pain: Secondary | ICD-10-CM | POA: Diagnosis not present

## 2017-12-14 LAB — POCT URINALYSIS DIP (MANUAL ENTRY)
Bilirubin, UA: NEGATIVE
Blood, UA: NEGATIVE
Glucose, UA: NEGATIVE mg/dL
Ketones, POC UA: NEGATIVE mg/dL
Leukocytes, UA: NEGATIVE
Nitrite, UA: NEGATIVE
Protein Ur, POC: NEGATIVE mg/dL
Spec Grav, UA: >=1.03 — AB (ref 1.010–1.025)
Urobilinogen, UA: 0.2 E.U./dL
pH, UA: 5 (ref 5.0–8.0)

## 2017-12-14 MED ORDER — OXYBUTYNIN CHLORIDE ER 5 MG PO TB24: 5.0000 mg | ORAL_TABLET | Freq: Every day | ORAL | 5 refills | Status: DC

## 2017-12-14 NOTE — Patient Instructions (Addendum)
IF you received an x-ray today, you will receive an invoice from [facility]. Please contact [facility] at [phone] with questions or concerns regarding your invoice.   IF you received labwork today, you will receive an invoice from [facility]. Please contact [facility] at [phone] with questions or concerns regarding your invoice.   Our billing staff will not be able to assist you with questions regarding bills from these companies.  You will be contacted with the lab results as soon as they are available. The fastest way to get your results is to activate your My Chart account. Instructions are located on the last page of this paperwork. If you have not heard from [place] regarding the results in 2 weeks, please contact this office.     Overactive Bladder, Adult Overactive bladder is a group of urinary symptoms. With overactive bladder, you may suddenly feel the need to pass urine (urinate) right away. After feeling this sudden urge, you might also leak urine if you cannot get to the bathroom fast enough (urinary incontinence). These symptoms might interfere with your daily work or social activities. Overactive bladder symptoms may also wake you up at night. Overactive bladder affects the nerve signals between your bladder and your brain. Your bladder may get the signal to empty before it is full. Very sensitive muscles can also make your bladder squeeze too soon. What are the causes? Many things can cause an overactive bladder. Possible causes include:  Urinary tract infection.  Infection of nearby tissues, such as the prostate.  Prostate enlargement.  Being pregnant with twins or more (multiples).  Surgery on the uterus or urethra.  Bladder stones, inflammation, or tumors.  Drinking too much caffeine or alcohol.  Certain medicines, especially those that you take to help your body get rid of extra fluid (diuretics) by increasing urine production.  Muscle  or nerve weakness, especially from: ? A spinal cord injury. ? Stroke. ? Multiple sclerosis. ? Parkinson disease.  Diabetes. This can cause a high urine volume that fills the bladder so quickly that the normal urge to urinate is triggered very strongly.  Constipation. A buildup of too much stool can put pressure on your bladder.  What increases the risk? You may be at greater risk for overactive bladder if you:  Are an older adult.  Smoke.  Are going through menopause.  Have prostate problems.  Have a neurological disease, such as stroke, dementia, Parkinson disease, or multiple sclerosis (MS).  Eat or drink things that irritate the bladder. These include alcohol, spicy food, and caffeine.  Are overweight or obese.  What are the signs or symptoms? The signs and symptoms of an overactive bladder include:  Sudden, strong urges to urinate.  Leaking urine.  Urinating eight or more times per day.  Waking up to urinate two or more times per night.  How is this diagnosed? Your health care provider may suspect overactive bladder based on your symptoms. The health care provider will do a physical exam and take your medical history. Blood or urine tests may also be done. For example, you might need to have a bladder function test to check how well you can hold your urine. You might also need to see a health care provider who specializes in the urinary tract (urologist). How is this treated? Treatment for overactive bladder depends on the cause of your condition and whether it is mild or severe. Certain treatments can be done in your health care provider's office or  clinic. You can also make lifestyle changes at home. Options include: Behavioral Treatments  Biofeedback. A specialist uses sensors to help you become aware of your body's signals.  Keeping a daily log of when you need to urinate and what happens after the urge. This may help you manage your condition.  Bladder  training. This helps you learn to control the urge to urinate by following a schedule that directs you to urinate at regular intervals (timed voiding). At first, you might have to wait a few minutes after feeling the urge. In time, you should be able to schedule bathroom visits an hour or more apart.  Kegel exercises. These are exercises to strengthen the pelvic floor muscles, which support the bladder. Toning these muscles can help you control urination, even if your bladder muscles are overactive. A specialist will teach you how to do these exercises correctly. They require daily practice.  Weight loss. If you are obese or overweight, losing weight might relieve your symptoms of overactive bladder. Talk to your health care provider about losing weight and whether there is a specific program or method that would work best for you.  Diet change. This might help if constipation is making your overactive bladder worse. Your health care provider or a dietitian can explain ways to change what you eat to ease constipation. You might also need to consume less alcohol and caffeine or drink other fluids at different times of the day.  Stopping smoking.  Wearing pads to absorb leakage while you wait for other treatments to take effect. Physical Treatments  Electrical stimulation. Electrodes send gentle pulses of electricity to strengthen the nerves or muscles that help to control the bladder. Sometimes, the electrodes are placed outside of the body. In other cases, they might be placed inside the body (implanted). This treatment can take several months to have an effect.  Supportive devices. Women may need a plastic device that fits into the vagina and supports the bladder (pessary). Medicines Several medicines can help treat overactive bladder and are usually used along with other treatments. Some are injected into the muscles involved in urination. Others come in pill form. Your health care provider may  prescribe:  Antispasmodics. These medicines block the signals that the nerves send to the bladder. This keeps the bladder from releasing urine at the wrong time.  Tricyclic antidepressants. These types of antidepressants also relax bladder muscles.  Surgery  You may have a device implanted to help manage the nerve signals that indicate when you need to urinate.  You may have surgery to implant electrodes for electrical stimulation.  Sometimes, very severe cases of overactive bladder require surgery to change the shape of the bladder. Follow these instructions at home:  Take medicines only as directed by your health care provider.  Use any implants or a pessary as directed by your health care provider.  Make any diet or lifestyle changes that are recommended by your health care provider. These might include: ? Drinking less fluid or drinking at different times of the day. If you need to urinate often during the night, you may need to stop drinking fluids early in the evening. ? Cutting down on caffeine or alcohol. Both can make an overactive bladder worse. Caffeine is found in coffee, tea, and sodas. ? Doing Kegel exercises to strengthen muscles. ? Losing weight if you need to. ? Eating a healthy and balanced diet to prevent constipation.  Keep a journal or log to track how much and when  you drink and also when you feel the need to urinate. This will help your health care provider to monitor your condition. Contact a health care provider if:  Your symptoms do not get better after treatment.  Your pain and discomfort are getting worse.  You have more frequent urges to urinate.  You have a fever. Get help right away if: You are not able to control your bladder at all. This information is not intended to replace advice given to you by your health care provider. Make sure you discuss any questions you have with your health care provider. Document Released: 06/07/2009 Document  Revised: 01/17/2016 Document Reviewed: 01/04/2014 Elsevier Interactive Patient Education  [employer].

## 2017-12-14 NOTE — Progress Notes (Signed)
Subjective:    Patient ID: Jennifer [name], female    DOB: 1956/06/27, 62 y.o.   MRN: [id number]  12/14/2017  Chronic Conditions (follow-up )    HPI This 62 y.o. female presents for [place] of acute sinusitis, acute stress reaction.  Management changes made at last visit include the following:  Acute stress reaction associated with termination from [facility].  Counseling provided during visit.  Patient adjusting well since termination. Acute sinusitis: New.  Prescription for amoxicillin and prednisone as well as Flonase therapy.  Recommend weaning over-the-counter nasal sprays over the next 2 weeks.  Return to clinic in 3 weeks for reevaluation.  Patient is to wean pseudoephedrine therapy as well. Hypertension: Controlled.  Obtain labs for chronic disease management.  No changes in therapy.  Obtain EKG due to the tachycardia during visit. Chronic bilateral lower back pain: Persistent.  Interfering with quality of life and ability to maintain employment.  Continue current medications.  A follow-up visit, [facility]  Also consider referral to orthopedic surgeon at that time. Overweight: -recommend weight loss, exercise for 30-60 minutes five days per week; recommend 1200 kcal restriction per day with a minimum of 60 grams of protein per day.   UPDATE: Feeling better within 24 hours after last visit.  Felt so much better; amazing.  Certainly within 48 hours; night and day.  Since here, no more advil, sudafed, or benadryl. Same nasal spray as three weeks ago.   Now that able to breathe, absolutely amazing.   Amoxicillin and Prednisone and Flonase.   Still using Flonase.   This weekend was miserable; since had first UTI, does not drink anything; two cups of water every morning.  Takes most of pills at bedtime.  Takes pills with water.  Does not get thirsty.  Last week, had to urinate every half hour; felt bladder was constantly full and had  to urinate.  Started taking Cystex OTC.  Now having urgency and frequency.  Feels better today.  No hematuria.  Full sensation in bladder for two years.  If tries to do something to occupy mind, can forget about it.  Then sometimes it is true urgency.  Frequent urination.    Low back pain: s/p physical therapy in the past. Felt really badly over the weekend; loves to walk.  This summer hoping to swim.  Hoping to feel better; usually walks with dog 30-45 minutes walking dog twice daily.  No urinary leakage. No urgency to get there.   Nocturia x 2-3.  No saddle paresthesias.  No radiation into legs.  No n/t/w in legs.   BP Readings from Last 3 Encounters:  12/14/17 138/72  11/23/17 140/80  04/01/17 124/76   Wt Readings from Last 3 Encounters:  12/14/17 161 lb (73 kg)  11/23/17 158 lb 9.6 oz (71.9 kg)  04/01/17 158 lb (71.7 kg)    There is no immunization history on file for this patient.  Review of Systems  Constitutional: Negative for chills, diaphoresis, fatigue and fever.  HENT: Positive for congestion, postnasal drip and rhinorrhea. Negative for ear pain, sinus pressure, sinus pain, sore throat, trouble swallowing and voice change.   Eyes: Negative for visual disturbance.  Respiratory: Negative for cough and shortness of breath.   Cardiovascular: Negative for chest pain, palpitations and leg swelling.  Gastrointestinal: Negative for abdominal pain, constipation, diarrhea, nausea and vomiting.  Endocrine: Negative for cold intolerance, heat intolerance, polydipsia, polyphagia and polyuria.  Genitourinary: Positive for  frequency and urgency. Negative for dysuria.  Musculoskeletal: Positive for back pain.  Neurological: Negative for dizziness, tremors, seizures, syncope, facial asymmetry, speech difficulty, weakness, light-headedness, numbness and headaches.    Past Medical History:  Diagnosis Date  . Hyperlipidemia   . Hypertension   . Migraine    Past Surgical History:    Procedure Laterality Date  . APPENDECTOMY    . LAPAROSCOPIC APPENDECTOMY  07/15/2012   Procedure: APPENDECTOMY LAPAROSCOPIC;  Surgeon: Zenovia Jarred, MD;  Location: [facility];  Service: General;  Laterality: N/A;  Laparoscopic Appendectomy   No Known Allergies Current Outpatient Medications on File Prior to Visit  Medication Sig Dispense Refill  . ACETAMINOPHEN-BUTALBITAL 50-325 MG TABS Take 1-2 tablets by mouth every 8 (eight) hours as needed. 45 each 0  . amLODipine (NORVASC) 5 MG tablet Take 1 tablet (5 mg total) by mouth daily. 90 tablet 1  . atorvastatin (LIPITOR) 10 MG tablet TAKE 1 TABLET BY MOUTH EVERY DAY 90 tablet 1  . fluticasone (FLONASE) 50 MCG/ACT nasal spray Place 2 sprays into both nostrils daily. 16 g 12  . hydrochlorothiazide (HYDRODIURIL) 25 MG tablet TAKE 1 TABLET BY MOUTH EVERY DAY 90 tablet 1  . Omega-3 Fatty Acids (OMEGA-3 FISH OIL PO) Take 1 capsule by mouth daily at 6 (six) AM.    . topiramate (TOPAMAX) 50 MG tablet Take 1 tablet (50 mg total) by mouth 2 (two) times daily. 180 tablet 1   No current facility-administered medications on file prior to visit.    Social History   Socioeconomic History  . Marital status: Widowed    Spouse name: Not on file  . Number of children: 2  . Years of education: Not on file  . Highest education level: Not on file  Occupational History  . Occupation: retail    Comment: [employer] in [place], [place]  . Financial resource strain: Not on file  . Food insecurity:    Worry: Not on file    Inability: Not on file  . Transportation needs:    Medical: Not on file    Non-medical: Not on file  Tobacco Use  . Smoking status: Never Smoker  . Smokeless tobacco: Never Used  Substance and Sexual Activity  . Alcohol use: Yes    Comment: 2 drinks once every 2-3 months  . Drug use: No  . Sexual activity: Not on file  Lifestyle  . Physical activity:    Days per week: Not on file    Minutes per session: Not on file  .  Stress: Not on file  Relationships  . Social connections:    Talks on phone: Not on file    Gets together: Not on file    Attends religious service: Not on file    Active member of club or organization: Not on file    Attends meetings of clubs or organizations: Not on file    Relationship status: Not on file  . Intimate partner violence:    Fear of current or ex partner: Not on file    Emotionally abused: Not on file    Physically abused: Not on file    Forced sexual activity: Not on file  Other Topics Concern  . Not on file  Social History Narrative   Marital status: widowed since 2008; husband was bipolar and alcoholic      Children: 2 children (28, 17); son lives with patient.Daughter lives in [place]/autism/does not drive.  Daughter lives with mother-in-law.  One  grandpuppy      Lives: with son      Employment: works at [employer]; also works in [place].      Tobacco: none      Alcohol: none; husband was an alcoholic.      Drugs:   none      Exercise:   Family History  Adopted: Yes       Objective:    BP 138/72   Pulse (!) 106   Temp 98 F (36.7 C) (Oral)   Resp 16   Wt 161 lb (73 kg)   SpO2 98%   BMI 28.53 kg/m  Physical Exam  Constitutional: She is oriented to person, place, and time. She appears well-developed and well-nourished. No distress.  HENT:  Head: Normocephalic and atraumatic.  Right Ear: External ear normal.  Left Ear: External ear normal.  Nose: Nose normal.  Mouth/Throat: Oropharynx is clear and moist.  Eyes: Pupils are equal, round, and reactive to light. Conjunctivae and EOM are normal.  Neck: Normal range of motion. Neck supple. Carotid bruit is not present. No thyromegaly present.  Cardiovascular: Normal rate, regular rhythm, normal heart sounds and intact distal pulses. Exam reveals no gallop and no friction rub.  No murmur heard. Pulmonary/Chest: Effort normal and breath sounds normal. She has no wheezes. She has  no rales.  Abdominal: Soft. Bowel sounds are normal. She exhibits no distension and no mass. There is no tenderness. There is no rebound and no guarding.  Musculoskeletal:       Right shoulder: She exhibits normal range of motion, no tenderness, no bony tenderness, no pain, no spasm, normal pulse and normal strength.       Left shoulder: She exhibits normal range of motion, no tenderness and no bony tenderness.       Lumbar back: She exhibits pain and spasm. She exhibits normal range of motion, no tenderness, no bony tenderness, no swelling, no edema, no deformity and normal pulse.  Lymphadenopathy:    She has no cervical adenopathy.  Neurological: She is alert and oriented to person, place, and time. No cranial nerve deficit.  Skin: Skin is warm and dry. No rash noted. She is not diaphoretic. No erythema. No pallor.  Psychiatric: She has a normal mood and affect. Her behavior is normal.   No results found. Depression screen [facility] 2/9 12/14/2017 11/23/2017 04/01/2017 08/19/2016 01/29/2016  Decreased Interest 0 0 0 0 0  Down, Depressed, Hopeless 0 0 0 0 0  PHQ - 2 Score 0 0 0 0 0   Fall Risk  12/14/2017 11/23/2017 04/01/2017 08/19/2016 01/29/2016  Falls in the past year? No No No No No        Assessment & Plan:   1. Essential hypertension   2. Seasonal allergic rhinitis due to pollen   3. Urinary frequency   4. Chronic bilateral low back pain without sciatica   5. Bilateral chronic knee pain     Hypertension well-controlled on current regimen.  No changes to management.  Allergic rhinitis: Controlled at this time on current regimen.  Recent treatment for acute sinusitis significant improvement.  Urinary frequency and urgency: Chronic.  Recent UTI.  Send urine culture.  Initiate Ditropan therapy.  Patient declined referral to urology if no improvement with Ditropan therapy, will warrant urology consultation.  Chronic bilateral lower back pain and bilateral knee pain: Stable at this time.   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  Highly recommend reevaluation by orthopedics and  physical therapy.  Patient to consider in the future.  Not ready for referral at this time.  Orders Placed This Encounter  Procedures  . Urine Culture  . Comprehensive metabolic panel    Order Specific Question:   Has the patient fasted?    Answer:   No  . Urine Microscopic  . Urine Microscopic  . POCT urinalysis dipstick   Meds ordered this encounter  Medications  . oxybutynin (DITROPAN-XL) 5 MG 24 hr tablet    Sig: Take 1 tablet (5 mg total) by mouth at bedtime.    Dispense:  30 tablet    Refill:  5    Return in about 3 months (around 03/15/2018) for follow-up chronic medical conditions.   Kristi Elayne Guerin, M.D. Primary Care at [facility] [address] [place], [place]  [address] [phone] phone [phone] fax

## 2017-12-15 LAB — COMPREHENSIVE METABOLIC PANEL
ALT: 22 IU/L (ref 0–32)
AST: 20 IU/L (ref 0–40)
Albumin/Globulin Ratio: 2 (ref 1.2–2.2)
Albumin: 4.3 g/dL (ref 3.6–4.8)
Alkaline Phosphatase: 68 IU/L (ref 39–117)
BUN/Creatinine Ratio: 20 (ref 12–28)
BUN: 20 mg/dL (ref 8–27)
Bilirubin Total: 0.2 mg/dL (ref 0.0–1.2)
CO2: 21 mmol/L (ref 20–29)
Calcium: 9.4 mg/dL (ref 8.7–10.3)
Chloride: 103 mmol/L (ref 96–106)
Creatinine, Ser: 1.02 mg/dL — ABNORMAL HIGH (ref 0.57–1.00)
GFR calc Af Amer: 69 mL/min/{1.73_m2} (ref >59)
GFR calc non Af Amer: 60 mL/min/{1.73_m2} (ref >59)
Globulin, Total: 2.2 g/dL (ref 1.5–4.5)
Glucose: 87 mg/dL (ref 65–99)
Potassium: 3.5 mmol/L (ref 3.5–5.2)
Sodium: 142 mmol/L (ref 134–144)
Total Protein: 6.5 g/dL (ref 6.0–8.5)

## 2017-12-15 LAB — URINE CULTURE: Organism ID, Bacteria: NO GROWTH

## 2017-12-15 LAB — URINALYSIS, MICROSCOPIC ONLY
Bacteria, UA: NONE SEEN
Casts: NONE SEEN /lpf

## 2018-01-18 ENCOUNTER — Encounter: Payer: Self-pay | Admitting: Family Medicine

## 2018-02-06 ENCOUNTER — Other Ambulatory Visit: Payer: Self-pay | Admitting: Physician Assistant

## 2018-02-06 NOTE — Telephone Encounter (Signed)
Patient is requesting a refill of the following medications: Requested Prescriptions   Pending Prescriptions Disp Refills  . meloxicam (MOBIC) 15 MG tablet [Pharmacy Med Name: MELOXICAM 15 MG TABLET] 60 tablet 4    Sig: TAKE 1/2-1 TABLET BY MOUTH DAILY    Date of patient request: 02/06/2018 Last office visit: 12/14/2017 Date of last refill: 04/15/2017 Last refill amount: 60 with 4 refills  Follow up time period per chart:

## 2018-03-13 ENCOUNTER — Other Ambulatory Visit: Payer: Self-pay | Admitting: Family Medicine

## 2018-03-15 ENCOUNTER — Ambulatory Visit: Payer: BLUE CROSS/BLUE SHIELD | Admitting: Family Medicine

## 2018-03-15 ENCOUNTER — Encounter: Payer: Self-pay | Admitting: Family Medicine

## 2018-03-15 ENCOUNTER — Other Ambulatory Visit: Payer: Self-pay

## 2018-03-15 ENCOUNTER — Ambulatory Visit (INDEPENDENT_AMBULATORY_CARE_PROVIDER_SITE_OTHER): Payer: BLUE CROSS/BLUE SHIELD

## 2018-03-15 VITALS — BP 118/78 | HR 97 | Temp 98.6°F | Ht 62.99 in | Wt 169.0 lb

## 2018-03-15 DIAGNOSIS — G43709 Chronic migraine without aura, not intractable, without status migrainosus: Secondary | ICD-10-CM

## 2018-03-15 DIAGNOSIS — I10 Essential (primary) hypertension: Secondary | ICD-10-CM

## 2018-03-15 DIAGNOSIS — M25562 Pain in left knee: Secondary | ICD-10-CM | POA: Diagnosis not present

## 2018-03-15 DIAGNOSIS — M25561 Pain in right knee: Secondary | ICD-10-CM | POA: Diagnosis not present

## 2018-03-15 DIAGNOSIS — E7800 Pure hypercholesterolemia, unspecified: Secondary | ICD-10-CM | POA: Diagnosis not present

## 2018-03-15 DIAGNOSIS — G8929 Other chronic pain: Secondary | ICD-10-CM

## 2018-03-15 DIAGNOSIS — M545 Low back pain: Secondary | ICD-10-CM | POA: Diagnosis not present

## 2018-03-15 LAB — COMPREHENSIVE METABOLIC PANEL
ALT: 23 IU/L (ref 0–32)
AST: 22 IU/L (ref 0–40)
Albumin/Globulin Ratio: 1.9 (ref 1.2–2.2)
Albumin: 4.6 g/dL (ref 3.6–4.8)
Alkaline Phosphatase: 66 IU/L (ref 39–117)
BUN/Creatinine Ratio: 21 (ref 12–28)
BUN: 24 mg/dL (ref 8–27)
Bilirubin Total: 0.3 mg/dL (ref 0.0–1.2)
CO2: 23 mmol/L (ref 20–29)
Calcium: 9.9 mg/dL (ref 8.7–10.3)
Chloride: 102 mmol/L (ref 96–106)
Creatinine, Ser: 1.15 mg/dL — ABNORMAL HIGH (ref 0.57–1.00)
GFR calc Af Amer: 59 mL/min/{1.73_m2} — ABNORMAL LOW (ref >59)
GFR calc non Af Amer: 51 mL/min/{1.73_m2} — ABNORMAL LOW (ref >59)
Globulin, Total: 2.4 g/dL (ref 1.5–4.5)
Glucose: 102 mg/dL — ABNORMAL HIGH (ref 65–99)
Potassium: 4.1 mmol/L (ref 3.5–5.2)
Sodium: 142 mmol/L (ref 134–144)
Total Protein: 7 g/dL (ref 6.0–8.5)

## 2018-03-15 LAB — LIPID PANEL
Chol/HDL Ratio: 4.3 ratio (ref 0.0–4.4)
Cholesterol, Total: 188 mg/dL (ref 100–199)
HDL: 44 mg/dL (ref >39)
LDL Calculated: 94 mg/dL (ref 0–99)
Triglycerides: 250 mg/dL — ABNORMAL HIGH (ref 0–149)
VLDL Cholesterol Cal: 50 mg/dL — ABNORMAL HIGH (ref 5–40)

## 2018-03-15 LAB — CBC WITH DIFFERENTIAL/PLATELET
Basophils Absolute: 0 10*3/uL (ref 0.0–0.2)
Basos: 0 %
EOS (ABSOLUTE): 0.2 10*3/uL (ref 0.0–0.4)
Eos: 2 %
Hematocrit: 44 % (ref 34.0–46.6)
Hemoglobin: 14.2 g/dL (ref 11.1–15.9)
Immature Grans (Abs): 0.1 10*3/uL (ref 0.0–0.1)
Immature Granulocytes: 1 %
Lymphocytes Absolute: 1.7 10*3/uL (ref 0.7–3.1)
Lymphs: 17 %
MCH: 30.3 pg (ref 26.6–33.0)
MCHC: 32.3 g/dL (ref 31.5–35.7)
MCV: 94 fL (ref 79–97)
Monocytes Absolute: 0.6 10*3/uL (ref 0.1–0.9)
Monocytes: 6 %
Neutrophils Absolute: 7.5 10*3/uL — ABNORMAL HIGH (ref 1.4–7.0)
Neutrophils: 74 %
Platelets: 346 10*3/uL (ref 150–450)
RBC: 4.68 x10E6/uL (ref 3.77–5.28)
RDW: 13.5 % (ref 12.3–15.4)
WBC: 10.1 10*3/uL (ref 3.4–10.8)

## 2018-03-15 MED ORDER — LOSARTAN POTASSIUM 50 MG PO TABS: 50.0000 mg | ORAL_TABLET | Freq: Every day | ORAL | 1 refills | Status: DC

## 2018-03-15 NOTE — Patient Instructions (Addendum)
FINISH HYDROCHLORATHIAZIDE AND THEN START LOSARTAN. CONTINUE AMLODIPINE.  CONTINUE MELOXICAM. CALL IF KNEE PAIN NOT IMPROVED IN 4-6 WEEKS FOR AN ORTHOPEDIC REFERRAL.   IF you received an x-ray today, you will receive an invoice from [facility]. Please contact [facility] at [phone] with questions or concerns regarding your invoice.   IF you received labwork today, you will receive an invoice from [facility]. Please contact [facility] at [phone] with questions or concerns regarding your invoice.   Our billing staff will not be able to assist you with questions regarding bills from these companies.  You will be contacted with the lab results as soon as they are available. The fastest way to get your results is to activate your My Chart account. Instructions are located on the last page of this paperwork. If you have not heard from [place] regarding the results in 2 weeks, please contact this office.      Lateral Collateral Knee Ligament Sprain, Phase I Rehab Ask your health care provider which exercises are safe for you. Do exercises exactly as told by your health care provider and adjust them as directed. It is normal to feel mild stretching, pulling, tightness, or discomfort as you do these exercises, but you should stop right away if you feel sudden pain or your pain gets worse.Do not begin these exercises until told by your health care provider. Stretching and range of motion exercises These exercises warm up your muscles and joints and improve the movement and flexibility of your knee. These exercises also help to relieve pain, numbness, and tingling. Exercise A: Heel slide  1. Lie on your back with both knees straight. If this causes back discomfort, bend your healthy knee, placing your foot flat on the floor. 2. Slowly slide your left / right heel back toward your buttocks until you feel a gentle stretch in the front of your knee or thigh. 3. Hold this position  for __________ seconds. 4. Slowly slide your left / right heel to the starting position. Repeat __________ times. Complete this exercise __________ times a day. Strengthening exercises These exercises build strength and endurance in your knee. Endurance is the ability to use your muscles for a long time, even after they get tired. Exercise B: Straight leg raises ( quadriceps) 1. Lie on your back with your left / right leg extended and your other knee bent. 2. Tense the muscles in the front of your left / right thigh. You should see your kneecap slide up or see increased dimpling just above the knee. Your thigh may even shake a bit. 3. Keep these muscles tight as you raise your leg 4-6 inches (10-15 cm) off the floor. Do not let your knee bend. If you cannot do this exercise without bending your knee, tighten the muscles in the front of your left / right thigh but do not lift your leg. 4. Hold this position for __________ seconds. 5. Keep these muscles tense as you lower your leg. 6. Relax the muscles slowly and completely. Repeat __________ times. Complete this exercise __________ times a day. Exercise C: Hamstring curls  If told by your health care provider, do this exercise while wearing ankle weights. Begin with __________ weights. Then increase the weight by 1 lb (0.5 kg) increments. Do not wear ankle weights that are more than __________. 1. Lie on your abdomen with your legs straight. 2. Bend your left / right knee as far as you can comfortably do that. Keep your hips flat on  the surface that is under them. When you bend your knee, bring your foot straight toward your buttock. Do not let it fall in or out. 3. Hold this position for __________ seconds. 4. Slowly lower your leg to the starting position. Repeat __________ times. Complete this exercise __________ times a day. Exercise D: Bridge ( hip extensors) 1. Lie on your back on a firm surface with your knees bent and your feet flat on  the floor. 2. Tighten your buttocks muscles and lift your bottom off the floor until your trunk is level with your thighs. ? Do not arch your back. ? You should feel the muscles working in your buttocks and the back of your thighs. If you do not feel a stretch in these muscles, slide your feet 1-2 inches (2.5-5 cm) farther away from your buttocks. 3. Hold this position for __________ seconds. 4. Slowly lower your hips to the starting position. 5. Let your buttocks muscles relax completely between repetitions. 6. If this exercise is too easy, try doing it with your arms crossed over your chest or by lifting one leg while your bottom is up off the floor. Repeat __________ times. Complete this exercise __________ times a day. Exercise E: Heel raise 1. Stand with your feet shoulder-width apart. 2. Keep your weight spread evenly over the width of your feet while you rise up on your toes. Use a wall or table to steady yourself, but try not to use it very much for support. 3. If this exercise is too easy, shift your weight toward your left / right leg until you feel challenged. 4. Hold this position for __________ seconds. 5. Slowly lower yourself to the starting position. Repeat __________ times. Complete this exercise __________ times a day. This information is not intended to replace advice given to you by your health care provider. Make sure you discuss any questions you have with your health care provider. Document Released: 08/11/2005 Document Revised: 04/17/2016 Document Reviewed: 06/23/2015 Elsevier Interactive Patient Education  2018 [employer].

## 2018-03-15 NOTE — Progress Notes (Signed)
Subjective:    Patient ID: Jennifer [name], female    DOB: [birth year], 62 y.o.   MRN: [id number]  03/15/2018  Hypertension (3 month follow-up ) and Knee Pain (pt states she has been having severe pain in her right knee x 6 weeks )    HPI This 62 y.o. female presents for THREE MONTH FOLLOW-UP of hypertension, allergic rhinitis, urinary frequency, chronic bilateral lower back pain and knee pain.  Management changes made in March 2019: Hypertension well-controlled on current regimen.  No changes to management. Allergic rhinitis: Controlled at this time on current regimen.  Recent treatment for acute sinusitis significant improvement. Urinary frequency and urgency: Chronic.  Recent UTI.  Send urine culture.  Initiate Ditropan therapy.  Patient declined referral to urology. if no improvement with Ditropan therapy, will warrant urology consultation. Chronic bilateral lower back pain and bilateral knee pain: Stable at this time.  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  Highly recommend reevaluation by orthopedics and physical therapy.  Patient to consider in the future.  Not ready for referral at this time.  UPDATE: Drives to [place] several days per week for week.  Drives 1 hour 10 minutes to [place].  Husband traveled constantly; mother was in [facility] for three months.    R knee pain: onset six weeks ago; not sure what happened; some days really painful to walk on.  Unable to walk dog; unable to exercise.  Propping knee up; alternating ice and heat.  All knows to do.  Hemp freeze 150mg  hemp extract.  Taking Meloxicam.  Now LEFT knee starting to hurt more.  Sitting down in shower to shave legs.  Hard to sit and flex knee to shave legs.  Dreads getting into shower.  Missing long walks with dog.  Daughter is back home now.  Brought her home March 03, 2018.  Limping to get her in.   Going up and down stairs is bad.  Started out with lateral and posterior knee pain.  Radiates down to R  lateral ankle.  There were a couple of times with walking cracking.  Almost a Mudlogger.  No swelling now but has swelled.  If does 2-3 days of excessive activity, will have swelling.  Has given out yet not recently.   Had to take Tramadol a few nights for pain.   Does not want to see orthopedics.  Wants to avoid an injection if possible.   Allergic rhinitis: much improved with Flonase.  OAB: eliminated caffeine; switched coffee to decaf.  Much improvement.  Dtiropan XL 5mg  daily is working well.  Finally started working.  Started Ditropan three months ago.  Started decaf coffee three weeks ago.   Nocturia x 1-2.  Not sleeping well.  Triggered by first UTI in past two years.    Insomnia: still not sleeping well.  Since daughter back, trying to go to bed 11-12; up at 430 this morning.  Knee pain interfering.      BP Readings from Last 3 Encounters:  03/15/18 118/78  12/14/17 138/72  11/23/17 140/80   Wt Readings from Last 3 Encounters:  03/15/18 169 lb (76.7 kg)  12/14/17 161 lb (73 kg)  11/23/17 158 lb 9.6 oz (71.9 kg)    There is no immunization history on file for this patient.  Review of Systems  Constitutional: Negative for activity change, appetite change, chills, diaphoresis, fatigue, fever and unexpected weight change.  HENT: Negative for congestion, dental problem, drooling, ear discharge, ear pain, facial  swelling, hearing loss, mouth sores, nosebleeds, postnasal drip, rhinorrhea, sinus pressure, sinus pain, sneezing, sore throat, tinnitus, trouble swallowing and voice change.   Eyes: Negative for photophobia, pain, discharge, redness, itching and visual disturbance.  Respiratory: Negative for apnea, cough, choking, chest tightness, shortness of breath, wheezing and stridor.   Cardiovascular: Negative for chest pain, palpitations and leg swelling.  Gastrointestinal: Negative for abdominal distention, abdominal pain, anal bleeding, blood in stool, constipation, diarrhea,  nausea, rectal pain and vomiting.  Endocrine: Negative for cold intolerance, heat intolerance, polydipsia, polyphagia and polyuria.  Genitourinary: Positive for frequency and urgency. Negative for decreased urine volume, difficulty urinating, dyspareunia, dysuria, enuresis, flank pain, genital sores, hematuria, menstrual problem, pelvic pain, vaginal bleeding, vaginal discharge and vaginal pain.  Musculoskeletal: Positive for arthralgias, back pain, gait problem and joint swelling. Negative for myalgias, neck pain and neck stiffness.  Skin: Negative for color change, pallor, rash and wound.  Allergic/Immunologic: Negative for environmental allergies, food allergies and immunocompromised state.  Neurological: Negative for dizziness, tremors, seizures, syncope, facial asymmetry, speech difficulty, weakness, light-headedness, numbness and headaches.  Hematological: Negative for adenopathy. Does not bruise/bleed easily.  Psychiatric/Behavioral: Positive for sleep disturbance. Negative for agitation, behavioral problems, confusion, decreased concentration, dysphoric mood, hallucinations, self-injury and suicidal ideas. The patient is not nervous/anxious and is not hyperactive.     Past Medical History:  Diagnosis Date  . Hyperlipidemia   . Hypertension   . Migraine    Past Surgical History:  Procedure Laterality Date  . APPENDECTOMY    . LAPAROSCOPIC APPENDECTOMY  07/15/2012   Procedure: APPENDECTOMY LAPAROSCOPIC;  Surgeon: Zenovia Jarred, MD;  Location: [facility];  Service: General;  Laterality: N/A;  Laparoscopic Appendectomy   No Known Allergies Current Outpatient Medications on File Prior to Visit  Medication Sig Dispense Refill  . ACETAMINOPHEN-BUTALBITAL 50-325 MG TABS Take 1-2 tablets by mouth every 8 (eight) hours as needed. 45 each 0  . amLODipine (NORVASC) 5 MG tablet Take 1 tablet (5 mg total) by mouth daily. 90 tablet 1  . atorvastatin (LIPITOR) 10 MG tablet TAKE 1 TABLET BY MOUTH  EVERY DAY 90 tablet 1  . fluticasone (FLONASE) 50 MCG/ACT nasal spray Place 2 sprays into both nostrils daily. 16 g 12  . meloxicam (MOBIC) 15 MG tablet TAKE 1/2-1 TABLET BY MOUTH DAILY 60 tablet 4  . Omega-3 Fatty Acids (OMEGA-3 FISH OIL PO) Take 1 capsule by mouth daily at 6 (six) AM.     No current facility-administered medications on file prior to visit.    Social History   Socioeconomic History  . Marital status: Widowed    Spouse name: Not on file  . Number of children: 2  . Years of education: Not on file  . Highest education level: Not on file  Occupational History  . Occupation: retail    Comment: [employer] in [place], [place]  . Financial resource strain: Not on file  . Food insecurity:    Worry: Not on file    Inability: Not on file  . Transportation needs:    Medical: Not on file    Non-medical: Not on file  Tobacco Use  . Smoking status: Never Smoker  . Smokeless tobacco: Never Used  Substance and Sexual Activity  . Alcohol use: Yes    Comment: 2 drinks once every 2-3 months  . Drug use: No  . Sexual activity: Not on file  Lifestyle  . Physical activity:    Days per week: Not on file  Minutes per session: Not on file  . Stress: Not on file  Relationships  . Social connections:    Talks on phone: Not on file    Gets together: Not on file    Attends religious service: Not on file    Active member of club or organization: Not on file    Attends meetings of clubs or organizations: Not on file    Relationship status: Not on file  . Intimate partner violence:    Fear of current or ex partner: Not on file    Emotionally abused: Not on file    Physically abused: Not on file    Forced sexual activity: Not on file  Other Topics Concern  . Not on file  Social History Narrative   Marital status: widowed since 2008; husband was bipolar and alcoholic      Children: 2 children (28, 48); son lives with patient.Daughter lives in [place]/autism/does  not drive.  Daughter lives with mother-in-law.  One grandpuppy      Lives: with son      Employment: works at [employer]; also works in [place].      Tobacco: none      Alcohol: none; husband was an alcoholic.      Drugs:   none      Exercise:   Family History  Adopted: Yes       Objective:    BP 118/78   Pulse 97   Temp 98.6 F (37 C) (Oral)   Ht 5' 2.99" (1.6 m)   Wt 169 lb (76.7 kg)   SpO2 99%   BMI 29.94 kg/m  Physical Exam  Constitutional: She is oriented to person, place, and time. She appears well-developed and well-nourished. No distress.  HENT:  Head: Normocephalic and atraumatic.  Right Ear: External ear normal.  Left Ear: External ear normal.  Nose: Nose normal.  Mouth/Throat: Oropharynx is clear and moist. No oropharyngeal exudate.  Eyes: Pupils are equal, round, and reactive to light. Conjunctivae and EOM are normal.  Neck: Normal range of motion. Neck supple. Carotid bruit is not present. No thyromegaly present.  Cardiovascular: Normal rate, regular rhythm, normal heart sounds and intact distal pulses. Exam reveals no gallop and no friction rub.  No murmur heard. Pulmonary/Chest: Effort normal and breath sounds normal. She has no wheezes. She has no rales.  Abdominal: Soft. Bowel sounds are normal. She exhibits no distension and no mass. There is no tenderness. There is no rebound and no guarding.  Musculoskeletal:       Right knee: She exhibits swelling. She exhibits normal range of motion, no ecchymosis, no deformity, no laceration, no erythema, normal alignment, normal patellar mobility and no bony tenderness. No tenderness found. No medial joint line, no lateral joint line and no patellar tendon tenderness noted.       Left knee: She exhibits normal range of motion, no swelling, no effusion, no ecchymosis, no deformity and no laceration. No tenderness found. No medial joint line, no lateral joint line, no MCL, no LCL and no patellar  tendon tenderness noted.  Lymphadenopathy:    She has no cervical adenopathy.  Neurological: She is alert and oriented to person, place, and time. She displays normal reflexes. No cranial nerve deficit or sensory deficit. She exhibits normal muscle tone. Coordination normal.  Skin: Skin is warm and dry. No rash noted. She is not diaphoretic. No erythema. No pallor.  Psychiatric: She has a normal mood and affect. Her behavior is  normal. Judgment and thought content normal.   No results found. Depression screen [facility] 2/9 03/15/2018 12/14/2017 11/23/2017 04/01/2017 08/19/2016  Decreased Interest 0 0 0 0 0  Down, Depressed, Hopeless 0 0 0 0 0  PHQ - 2 Score 0 0 0 0 0   Fall Risk  03/15/2018 12/14/2017 11/23/2017 04/01/2017 08/19/2016  Falls in the past year? No No No No No        Assessment & Plan:   1. Essential hypertension   2. Chronic migraine without aura without status migrainosus, not intractable   3. Chronic bilateral low back pain without sciatica   4. Pure hypercholesterolemia   5. Bilateral chronic knee pain   6. Acute pain of right knee     Acute on chronic right knee pain: Obtain right knee films.  Continue meloxicam therapy for the next 2 weeks.  Overactive bladder with urinary urgency: Improved with Ditropan therapy and caffeine avoidance.  Discontinue hydrochlorothiazide.  Patient declined urology referral.  Consider increasing Ditropan to 10 mg daily symptoms persist despite discontinuing hydrochlorothiazide therapy  Hypertension: Well-controlled.  Discontinue hydrochlorothiazide due to urinary frequency.  Replace with losartan 50 mg daily.  Continue amlodipine 5 mg daily.  Obtain labs for chronic disease management.  Allergic rhinitis: Well-controlled at this time.  Continue Flonase therapy.  Hyperlipidemia: Well-controlled at this time.  Obtain labs no changes to therapy.  Orders Placed This Encounter  Procedures  . DG Knee Complete 4 Views Right    Standing Status:    Future    Number of Occurrences:   1    Standing Expiration Date:   03/15/2019    Order Specific Question:   Reason for Exam (SYMPTOM  OR DIAGNOSIS REQUIRED)    Answer:   R lateral and posterior knee pain for six weeks    Order Specific Question:   Preferred imaging location?    Answer:   External  . CBC with Differential/Platelet  . Comprehensive metabolic panel    Order Specific Question:   Has the patient fasted?    Answer:   No  . Lipid panel    Order Specific Question:   Has the patient fasted?    Answer:   No   Meds ordered this encounter  Medications  . losartan (COZAAR) 50 MG tablet    Sig: Take 1 tablet (50 mg total) by mouth daily.    Dispense:  90 tablet    Refill:  1  . oxybutynin (DITROPAN-XL) 5 MG 24 hr tablet    Sig: Take 1 tablet (5 mg total) by mouth at bedtime.    Dispense:  90 tablet    Refill:  1  . topiramate (TOPAMAX) 50 MG tablet    Sig: Take 1 tablet (50 mg total) by mouth 2 (two) times daily.    Dispense:  180 tablet    Refill:  1    Return in about 3 months (around 06/15/2018) for follow-up chronic medical conditions MANI.   Rogenia Werntz Elayne Guerin, M.D. Primary Care at [facility] [address] [place], [place]  [address] [phone] phone [phone] fax

## 2018-03-16 ENCOUNTER — Encounter: Payer: Self-pay | Admitting: Family Medicine

## 2018-03-16 MED ORDER — TOPIRAMATE 50 MG PO TABS: 50.0000 mg | ORAL_TABLET | Freq: Two times a day (BID) | ORAL | 1 refills | Status: DC

## 2018-03-16 MED ORDER — OXYBUTYNIN CHLORIDE ER 5 MG PO TB24: 5.0000 mg | ORAL_TABLET | Freq: Every day | ORAL | 1 refills | Status: DC

## 2018-03-21 ENCOUNTER — Other Ambulatory Visit: Payer: Self-pay | Admitting: Family Medicine

## 2018-04-02 ENCOUNTER — Telehealth: Payer: Self-pay | Admitting: Family Medicine

## 2018-04-02 NOTE — Telephone Encounter (Signed)
Called pt to let them know we would need to reschedule their appt 06/17/18. Rescheduled

## 2018-05-19 ENCOUNTER — Encounter: Payer: Self-pay | Admitting: Urgent Care

## 2018-05-19 ENCOUNTER — Ambulatory Visit: Payer: BLUE CROSS/BLUE SHIELD | Admitting: Urgent Care

## 2018-05-19 ENCOUNTER — Other Ambulatory Visit: Payer: Self-pay

## 2018-05-19 VITALS — BP 135/81 | HR 76 | Temp 98.6°F | Resp 17 | Ht 62.99 in | Wt 174.2 lb

## 2018-05-19 DIAGNOSIS — M545 Low back pain, unspecified: Secondary | ICD-10-CM

## 2018-05-19 DIAGNOSIS — G43709 Chronic migraine without aura, not intractable, without status migrainosus: Secondary | ICD-10-CM

## 2018-05-19 DIAGNOSIS — G4700 Insomnia, unspecified: Secondary | ICD-10-CM

## 2018-05-19 DIAGNOSIS — E7800 Pure hypercholesterolemia, unspecified: Secondary | ICD-10-CM | POA: Diagnosis not present

## 2018-05-19 DIAGNOSIS — Z8781 Personal history of (healed) traumatic fracture: Secondary | ICD-10-CM

## 2018-05-19 DIAGNOSIS — F439 Reaction to severe stress, unspecified: Secondary | ICD-10-CM

## 2018-05-19 DIAGNOSIS — I10 Essential (primary) hypertension: Secondary | ICD-10-CM

## 2018-05-19 DIAGNOSIS — M25562 Pain in left knee: Secondary | ICD-10-CM

## 2018-05-19 DIAGNOSIS — G8929 Other chronic pain: Secondary | ICD-10-CM

## 2018-05-19 DIAGNOSIS — M25561 Pain in right knee: Secondary | ICD-10-CM

## 2018-05-19 DIAGNOSIS — M170 Bilateral primary osteoarthritis of knee: Secondary | ICD-10-CM

## 2018-05-19 MED ORDER — TRAMADOL HCL 50 MG PO TABS: 50.0000 mg | ORAL_TABLET | Freq: Three times a day (TID) | ORAL | 0 refills | Status: DC | PRN

## 2018-05-19 MED ORDER — AMLODIPINE BESYLATE 5 MG PO TABS: 5.0000 mg | ORAL_TABLET | Freq: Every day | ORAL | 1 refills | Status: DC

## 2018-05-19 MED ORDER — HYDROXYZINE HCL 25 MG PO TABS: 12.5000 mg | ORAL_TABLET | Freq: Every evening | ORAL | 5 refills | Status: DC | PRN

## 2018-05-19 MED ORDER — BUSPIRONE HCL 5 MG PO TABS: 5.0000 mg | ORAL_TABLET | Freq: Two times a day (BID) | ORAL | 1 refills | Status: DC

## 2018-05-19 NOTE — Patient Instructions (Addendum)
Dr. Pamella Pert, Dr. Nolon Rod, Dr. Mitchel Honour, Dr. Carlota Raspberry are physicians that you can continue working with at this clinic.    [facility] ([address] [place]) - [phone]  [place]) - [phone]  [facility] - [phone]  [facility] [place] - [phone]  [facility]  - [phone] (do not file insurance)     If you have lab work done today you will be contacted with your lab results within the next 2 weeks.  If you have not heard from [place] then please contact us. The fastest way to get your results is to register for My Chart.    Arthritis Arthritis is a term that is commonly used to refer to joint pain or joint disease. There are more than 100 types of arthritis. What are the causes? The most common cause of this condition is wear and tear of a joint. Other causes include:  Gout.  Inflammation of a joint.  An infection of a joint.  Sprains and other injuries near the joint.  A drug reaction or allergic reaction.  In some cases, the cause may not be known. What are the signs or symptoms? The main symptom of this condition is pain in the joint with movement. Other symptoms include:  Redness, swelling, or stiffness at a joint.  Warmth coming from the joint.  Fever.  Overall feeling of illness.  How is this diagnosed? This condition may be diagnosed with a physical exam and tests, including:  Blood tests.  Urine tests.  Imaging tests, such as MRI, X-rays, or a CT scan.  Sometimes, fluid is removed from a joint for testing. How is this treated? Treatment for this condition may involve:  Treatment of the cause, if it is known.  Rest.  Raising (elevating) the joint.  Applying cold or hot packs to the joint.  Medicines to improve symptoms and reduce inflammation.  Injections  of a steroid such as cortisone into the joint to help reduce pain and inflammation.  Depending on the cause of your arthritis, you may need to make lifestyle changes to reduce stress on your joint. These changes may include exercising more and losing weight. Follow these instructions at home: Medicines  Take over-the-counter and prescription medicines only as told by your health care provider.  Do not take aspirin to relieve pain if gout is suspected. Activity  Rest your joint if told by your health care provider. Rest is important when your disease is active and your joint feels painful, swollen, or stiff.  Avoid activities that make the pain worse. It is important to balance activity with rest.  Exercise your joint regularly with range-of-motion exercises as told by your health care provider. Try doing low-impact exercise, such as: ? Swimming. ? Water aerobics. ? Biking. ? Walking. Joint Care   If your joint is swollen, keep it elevated if told by your health care provider.  If your joint feels stiff in the morning, try taking a warm shower.  If directed, apply heat to the joint. If you have diabetes, do not apply heat without permission from your health care provider. ? Put a towel between the joint and the hot pack or heating pad. ? Leave the heat on the area for 20-30 minutes.  If directed, apply ice to the joint: ? Put ice in a plastic bag. ? Place a towel between your skin and the bag. ?  Leave the ice on for 20 minutes, 2-3 times per day.  Keep all follow-up visits as told by your health care provider. This is important. Contact a health care provider if:  The pain gets worse.  You have a fever. Get help right away if:  You develop severe joint pain, swelling, or redness.  Many joints become painful and swollen.  You develop severe back pain.  You develop severe weakness in your leg.  You cannot control your bladder or bowels. This information is not  intended to replace advice given to you by your health care provider. Make sure you discuss any questions you have with your health care provider. Document Released: 09/18/2004 Document Revised: 01/17/2016 Document Reviewed: 11/06/2014 Elsevier Interactive Patient Education  2018 [employer].    Buspirone tablets What is this medicine? BUSPIRONE (byoo SPYE rone) is used to treat anxiety disorders. This medicine may be used for other purposes; ask your health care provider or pharmacist if you have questions. COMMON BRAND NAME(S): BuSpar What should I tell my health care provider before I take this medicine? They need to know if you have any of these conditions: -kidney or liver disease -an unusual or allergic reaction to buspirone, other medicines, foods, dyes, or preservatives -pregnant or trying to get pregnant -breast-feeding How should I use this medicine? Take this medicine by mouth with a glass of water. Follow the directions on the prescription label. You may take this medicine with or without food. To ensure that this medicine always works the same way for you, you should take it either always with or always without food. Take your doses at regular intervals. Do not take your medicine more often than directed. Do not stop taking except on the advice of your doctor or health care professional. Talk to your pediatrician regarding the use of this medicine in children. Special care may be needed. Overdosage: If you think you have taken too much of this medicine contact a poison control center or emergency room at once. NOTE: This medicine is only for you. Do not share this medicine with others. What if I miss a dose? If you miss a dose, take it as soon as you can. If it is almost time for your next dose, take only that dose. Do not take double or extra doses. What may interact with this medicine? Do not take this medicine with any of the following medications: -linezolid -MAOIs like  Carbex, Eldepryl, Marplan, Nardil, and Parnate -methylene blue -procarbazine This medicine may also interact with the following medications: -diazepam -digoxin -diltiazem -erythromycin -grapefruit juice -haloperidol -medicines for mental depression or mood problems -medicines for seizures like carbamazepine, phenobarbital and phenytoin -nefazodone -other medications for anxiety -rifampin -ritonavir -some antifungal medicines like itraconazole, ketoconazole, and voriconazole -verapamil -warfarin This list may not describe all possible interactions. Give your health care provider a list of all the medicines, herbs, non-prescription drugs, or dietary supplements you use. Also tell them if you smoke, drink alcohol, or use illegal drugs. Some items may interact with your medicine. What should I watch for while using this medicine? Visit your doctor or health care professional for regular checks on your progress. It may take 1 to 2 weeks before your anxiety gets better. You may get drowsy or dizzy. Do not drive, use machinery, or do anything that needs mental alertness until you know how this drug affects you. Do not stand or sit up quickly, especially if you are an older patient. This reduces the risk  of dizzy or fainting spells. Alcohol can make you more drowsy and dizzy. Avoid alcoholic drinks. What side effects may I notice from receiving this medicine? Side effects that you should report to your doctor or health care professional as soon as possible: -blurred vision or other vision changes -chest pain -confusion -difficulty breathing -feelings of hostility or anger -muscle aches and pains -numbness or tingling in hands or feet -ringing in the ears -skin rash and itching -vomiting -weakness Side effects that usually do not require medical attention (report to your doctor or health care professional if they continue or are bothersome): -disturbed dreams,  nightmares -headache -nausea -restlessness or nervousness -sore throat and nasal congestion -stomach upset This list may not describe all possible side effects. Call your doctor for medical advice about side effects. You may report side effects to FDA at 1-800-FDA-1088. Where should I keep my medicine? Keep out of the reach of children. Store at room temperature below 30 degrees C (86 degrees F). Protect from light. Keep container tightly closed. Throw away any unused medicine after the expiration date. NOTE: This sheet is a summary. It may not cover all possible information. If you have questions about this medicine, talk to your doctor, pharmacist, or health care provider.  2018 Elsevier/Gold Standard (2010-03-21 18:06:11)    Hydroxyzine capsules or tablets What is this medicine? HYDROXYZINE (hye [place] i zeen) is an antihistamine. This medicine is used to treat allergy symptoms. It is also used to treat anxiety and tension. This medicine can be used with other medicines to induce sleep before surgery. This medicine may be used for other purposes; ask your health care provider or pharmacist if you have questions. COMMON BRAND NAME(S): ANX, Atarax, Rezine, Vistaril What should I tell my health care provider before I take this medicine? They need to know if you have any of these conditions: -any chronic illness -difficulty passing urine -glaucoma -heart disease -kidney disease -liver disease -lung disease -an unusual or allergic reaction to hydroxyzine, cetirizine, other medicines, foods, dyes, or preservatives -pregnant or trying to get pregnant -breast-feeding How should I use this medicine? Take this medicine by mouth with a full glass of water. Follow the directions on the prescription label. You may take this medicine with food or on an empty stomach. Take your medicine at regular intervals. Do not take your medicine more often than directed. Talk to your pediatrician regarding  the use of this medicine in children. Special care may be needed. While this drug may be prescribed for children as young as 12 years of age for selected conditions, precautions do apply. Patients over 48 years old may have a stronger reaction and need a smaller dose. Overdosage: If you think you have taken too much of this medicine contact a poison control center or emergency room at once. NOTE: This medicine is only for you. Do not share this medicine with others. What if I miss a dose? If you miss a dose, take it as soon as you can. If it is almost time for your next dose, take only that dose. Do not take double or extra doses. What may interact with this medicine? -alcohol -barbiturate medicines for sleep or seizures -medicines for colds, allergies -medicines for depression, anxiety, or emotional disturbances -medicines for pain -medicines for sleep -muscle relaxants This list may not describe all possible interactions. Give your health care provider a list of all the medicines, herbs, non-prescription drugs, or dietary supplements you use. Also tell them if you smoke,  drink alcohol, or use illegal drugs. Some items may interact with your medicine. What should I watch for while using this medicine? Tell your doctor or health care professional if your symptoms do not improve. You may get drowsy or dizzy. Do not drive, use machinery, or do anything that needs mental alertness until you know how this medicine affects you. Do not stand or sit up quickly, especially if you are an older patient. This reduces the risk of dizzy or fainting spells. Alcohol may interfere with the effect of this medicine. Avoid alcoholic drinks. Your mouth may get dry. Chewing sugarless gum or sucking hard candy, and drinking plenty of water may help. Contact your doctor if the problem does not go away or is severe. This medicine may cause dry eyes and blurred vision. If you wear contact lenses you may feel some  discomfort. Lubricating drops may help. See your eye doctor if the problem does not go away or is severe. If you are receiving skin tests for allergies, tell your doctor you are using this medicine. What side effects may I notice from receiving this medicine? Side effects that you should report to your doctor or health care professional as soon as possible: -fast or irregular heartbeat -difficulty passing urine -seizures -slurred speech or confusion -tremor Side effects that usually do not require medical attention (report to your doctor or health care professional if they continue or are bothersome): -constipation -drowsiness -fatigue -headache -stomach upset This list may not describe all possible side effects. Call your doctor for medical advice about side effects. You may report side effects to FDA at 1-800-FDA-1088. Where should I keep my medicine? Keep out of the reach of children. Store at room temperature between 15 and 30 degrees C (59 and 86 degrees F). Keep container tightly closed. Throw away any unused medicine after the expiration date. NOTE: This sheet is a summary. It may not cover all possible information. If you have questions about this medicine, talk to your doctor, pharmacist, or health care provider.  2018 Elsevier/Gold Standard (2007-12-24 14:50:59)     IF you received an x-ray today, you will receive an invoice from [facility]. Please contact [facility] at [phone] with questions or concerns regarding your invoice.   IF you received labwork today, you will receive an invoice from [place]. Please contact [facility] at [phone] with questions or concerns regarding your invoice.   Our billing staff will not be able to assist you with questions regarding bills from these companies.  You will be contacted with the lab results as soon as they are available. The fastest way to get your results is to activate your My Chart account.  Instructions are located on the last page of this paperwork. If you have not heard from [place] regarding the results in 2 weeks, please contact this office.    We recommend that you schedule a mammogram for breast cancer screening. Typically, you do not need a referral to do this. Please contact a local imaging center to schedule your mammogram.  [facility] - [phone]  *ask for the Radiology Department [facility] - [phone] or [phone]  [facility] - [phone] [place] [phone] [facility] - [phone]  *ask for the [facility] - [phone]  *ask for the Radiology Department [facility] - [phone]  *ask for the [place] - [phone]

## 2018-05-19 NOTE — Progress Notes (Signed)
   MRN: [id number] DOB: [birth year]  Subjective:   Jennifer [name] is a 62 y.o. female presenting for follow up on Hypertension, chronic back pain, bilateral knee pain. Has been using Tramadol with good relief and needs a refill. Her pain does interrupt her sleep. She is now having bilateral knee pain from having an accident with one of her dogs. Denies fever, redness, swelling. Reports difficulty with insomnia due to multiple factors. Has family stress at home from having to support her daughter. Also has stress with her finances, collects social security. 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 Patient's sister is also a partner with her business.  Denies SI, HI.  Denies ever having to take any sleep medications or depression, anxiety medications.  Patient is adopted so she does not home with her family history.   reports that she has never smoked. She has never used smokeless tobacco. She reports that she drinks alcohol. She reports that she does not use drugs.   Jennifer [name] has a current medication list which includes the following prescription(s): acetaminophen-butalbital, amlodipine, atorvastatin, fluticasone, losartan, meloxicam, omega-3 fatty acids, oxybutynin, and topiramate. Also has No Known Allergies.  Jennifer [name]  has a past medical history of Hyperlipidemia, Hypertension, and Migraine. Also  has a past surgical history that includes laparoscopic appendectomy (07/15/2012) and Appendectomy.  Objective:   Vitals: BP 135/81 (BP Location: Right Arm, Patient Position: Sitting, Cuff Size: Normal)   Pulse 76   Temp 98.6 F (37 C) (Oral)   Resp 17   Ht 5' 2.99" (1.6 m)   Wt 174 lb 3.2 oz (79 kg)   SpO2 97%   BMI 30.87 kg/m   BP Readings from Last 3 Encounters:  05/19/18 135/81  03/15/18 118/78  12/14/17 138/72    Physical Exam  Constitutional: She is oriented to person, place, and time. She appears well-developed and  well-nourished.  Cardiovascular: Normal rate.  Pulmonary/Chest: Effort normal.  Musculoskeletal:       Right knee: She exhibits normal range of motion, no swelling, no effusion, no ecchymosis, no deformity, no erythema, normal alignment, normal patellar mobility, no bony tenderness and normal meniscus. No tenderness found.       Left knee: She exhibits normal range of motion, no swelling, no effusion, no ecchymosis, no deformity, no laceration, no erythema, normal alignment, normal patellar mobility, no bony tenderness and normal meniscus. No tenderness found.  Neurological: She is alert and oriented to person, place, and time.   Assessment and Plan :   Essential hypertension  Chronic migraine without aura without status migrainosus, not intractable  Pure hypercholesterolemia  Chronic midline low back pain without sciatica - Plan: Ambulatory referral to Orthopedic Surgery  History of compression fracture of spine  Acute pain of both knees - Plan: Ambulatory referral to Orthopedic Surgery  Osteoarthritis of both knees, unspecified osteoarthritis type  Stress at home  Insomnia, unspecified type  Refills provided. Referral to ortho for multiple joint pains, chronic back pain. Otherwise, f/u in 6 months.  Jennifer Eagles, PA-C [facility] [phone] 05/19/2018  1:54 PM

## 2018-06-10 ENCOUNTER — Other Ambulatory Visit: Payer: Self-pay | Admitting: Urgent Care

## 2018-06-17 ENCOUNTER — Ambulatory Visit: Payer: BLUE CROSS/BLUE SHIELD | Admitting: Urgent Care

## 2018-06-18 ENCOUNTER — Ambulatory Visit: Payer: BLUE CROSS/BLUE SHIELD | Admitting: Urgent Care

## 2018-07-02 ENCOUNTER — Encounter: Payer: Self-pay | Admitting: Family Medicine

## 2018-07-02 ENCOUNTER — Other Ambulatory Visit: Payer: Self-pay

## 2018-07-02 ENCOUNTER — Ambulatory Visit: Payer: BLUE CROSS/BLUE SHIELD | Admitting: Family Medicine

## 2018-07-02 VITALS — BP 115/71 | HR 72 | Temp 98.2°F | Ht 62.0 in | Wt 176.8 lb

## 2018-07-02 DIAGNOSIS — G4700 Insomnia, unspecified: Secondary | ICD-10-CM | POA: Diagnosis not present

## 2018-07-02 DIAGNOSIS — F418 Other specified anxiety disorders: Secondary | ICD-10-CM

## 2018-07-02 MED ORDER — BUSPIRONE HCL 5 MG PO TABS: 5.0000 mg | ORAL_TABLET | Freq: Two times a day (BID) | ORAL | 1 refills | Status: DC

## 2018-07-02 MED ORDER — TRAZODONE HCL 50 MG PO TABS: 25.0000 mg | ORAL_TABLET | Freq: Every evening | ORAL | 3 refills | Status: DC | PRN

## 2018-07-02 MED ORDER — BUSPIRONE HCL 5 MG PO TABS: 5.0000 mg | ORAL_TABLET | Freq: Three times a day (TID) | ORAL | 1 refills | Status: DC

## 2018-07-02 NOTE — Progress Notes (Signed)
Subjective:  By signing my name below, I, Jennifer [name], attest that this documentation has been prepared under Jennifer direction and in Jennifer presence of Jennifer Agreste, MD Electronically Signed: Ladene Artist, ED Scribe 07/02/2018 at 11:11 AM.   [name] ID: Jennifer [name], female    DOB: [birth year], 62 y.o.   MRN: [id number]  Chief Complaint  [name] presents with  . Insomnia  . med check   HPI Jennifer [name] is a 62 y.o. female who presents to Primary Care at [facility] for f/u insomnia. New pt to me. Prev followed by Dr. Tamala Julian and Jaynee Eagles, PA-C. Insomnia due to multiple stressors. Treated with Buspar 5 mg bid and hydroxyzine 12.5-25 mg qhs prn. Provided numbers for counseling. - Pt states that Buspar has been working well; has noticed that she is less emotional and irritated. However, she felt flu-like symptoms with hydroxyzine and that meds didn't assist with sleep at all. States she stays up for most of 1 night and is able to sleep for ~3 hours Jennifer next night. States her mind is constantly racing, she worries about her sister and being on a fixed income but she doesn't think she is under extreme amounts of stress. Reports being awake since 3 AM today and further reports only having ~9 hours of sleep within 5 days; which has been normal for her over Jennifer past 4-5 months. She has recently tried Melatonin and Extra Strength Sleepy Time Tea with minimal improvement. States she takes tramadol prn; she has only taken 3 tabs since prescribed in September. Denies SI, drinking caffeine (drinks decaffeinated coffee). Pt is not seeing a Social worker.  Depression screen [facility] 2/9 07/02/2018 05/19/2018 03/15/2018 12/14/2017 11/23/2017  Decreased Interest 0 0 0 0 0  Down, Depressed, Hopeless 0 0 0 0 0  PHQ - 2 Score 0 0 0 0 0   Health Maintenance Mammogram 05/2017. Unsure of family h/o breast CA; pt was adopted. Denies personal h/o breast CA.  [name] Active Problem List   Diagnosis Date Noted  . Essential  hypertension 06/13/2015  . Bilateral chronic knee pain 10/14/2013  . Chronic bilateral low back pain without sciatica 11/01/2012  . S/P laparoscopic appendectomy 07/28/2012  . Migraines 09/24/2011  . Hyperlipidemia 09/24/2011   Past Medical History:  Diagnosis Date  . Hyperlipidemia   . Hypertension   . Migraine    Past Surgical History:  Procedure Laterality Date  . APPENDECTOMY    . LAPAROSCOPIC APPENDECTOMY  07/15/2012   Procedure: APPENDECTOMY LAPAROSCOPIC;  Surgeon: Zenovia Jarred, MD;  Location: [place];  Service: General;  Laterality: N/A;  Laparoscopic Appendectomy   No Known Allergies Prior to Admission medications   Medication Sig Start Date End Date Taking? Authorizing Provider  ACETAMINOPHEN-BUTALBITAL 50-325 MG TABS Take 1-2 tablets by mouth every 8 (eight) hours as needed. 04/01/17   Wardell Honour, MD  amLODipine (NORVASC) 5 MG tablet Take 1 tablet (5 mg total) by mouth daily. 05/19/18   Jaynee Eagles, PA-C  atorvastatin (LIPITOR) 10 MG tablet TAKE 1 TABLET BY MOUTH EVERY DAY 03/13/18   Wardell Honour, MD  busPIRone (BUSPAR) 5 MG tablet Take 1 tablet (5 mg total) by mouth 2 (two) times daily. 05/19/18   Jaynee Eagles, PA-C  fluticasone (FLONASE) 50 MCG/ACT nasal spray Place 2 sprays into both nostrils daily. 11/23/17   Wardell Honour, MD  hydrOXYzine (ATARAX/VISTARIL) 25 MG tablet Take 0.5-1 tablets (12.5-25 mg total) by mouth at bedtime as needed. 05/19/18  Jaynee Eagles, PA-C  losartan (COZAAR) 50 MG tablet Take 1 tablet (50 mg total) by mouth daily. 03/15/18   Wardell Honour, MD  meloxicam (MOBIC) 15 MG tablet TAKE 1/2-1 TABLET BY MOUTH DAILY 02/06/18   Gale Journey, Damaris Hippo, PA-C  Omega-3 Fatty Acids (OMEGA-3 FISH OIL PO) Take 1 capsule by mouth daily at 6 (six) AM.    [provider]  oxybutynin (DITROPAN-XL) 5 MG 24 hr tablet Take 1 tablet (5 mg total) by mouth at bedtime. 03/16/18   Wardell Honour, MD  topiramate (TOPAMAX) 50 MG tablet Take 1 tablet (50 mg total) by mouth  2 (two) times daily. 03/16/18   Wardell Honour, MD  traMADol (ULTRAM) 50 MG tablet Take 1 tablet (50 mg total) by mouth every 8 (eight) hours as needed for moderate pain or severe pain. 05/19/18   Jaynee Eagles, PA-C   Social History   Socioeconomic History  . Marital status: Widowed    Spouse name: Not on file  . Number of children: 2  . Years of education: Not on file  . Highest education level: Not on file  Occupational History  . Occupation: retail    Comment: [employer] in [place], [place]  . Financial resource strain: Not on file  . Food insecurity:    Worry: Not on file    Inability: Not on file  . Transportation needs:    Medical: Not on file    Non-medical: Not on file  Tobacco Use  . Smoking status: Never Smoker  . Smokeless tobacco: Never Used  Substance and Sexual Activity  . Alcohol use: Yes    Comment: 2 drinks once every 2-3 months  . Drug use: No  . Sexual activity: Not on file  Lifestyle  . Physical activity:    Days per week: Not on file    Minutes per session: Not on file  . Stress: Not on file  Relationships  . Social connections:    Talks on phone: Not on file    Gets together: Not on file    Attends religious service: Not on file    Active member of club or organization: Not on file    Attends meetings of clubs or organizations: Not on file    Relationship status: Not on file  . Intimate partner violence:    Fear of current or ex partner: Not on file    Emotionally abused: Not on file    Physically abused: Not on file    Forced sexual activity: Not on file  Other Topics Concern  . Not on file  Social History Narrative   Marital status: widowed since 2008; husband was bipolar and alcoholic      Children: 2 children (28, 50); son lives with [name].Daughter lives in [place]/autism/does not drive.  Daughter lives with mother-in-law.  One grandpuppy      Lives: with son      Employment: works at [employer]; also works in [place].      Tobacco: none      Alcohol: none; husband was an alcoholic.      Drugs:   none      Exercise:   Review of Systems  Psychiatric/Behavioral: Positive for sleep disturbance. Negative for suicidal ideas. Jennifer [name] is nervous/anxious.    Wt Readings from Last 3 Encounters:  07/02/18 176 lb 12.8 oz (80.2 kg)  05/19/18 174 lb 3.2 oz (79 kg)  03/15/18 169 lb (76.7 kg)  Objective:   Physical Exam  Constitutional: She is oriented to person, place, and time. She appears well-developed and well-nourished. No distress.  HENT:  Head: Normocephalic and atraumatic.  Eyes: Conjunctivae and EOM are normal.  Neck: Neck supple. No tracheal deviation present.  Cardiovascular: Normal rate.  Pulmonary/Chest: Effort normal. No respiratory distress.  Musculoskeletal: Normal range of motion.  Neurological: She is alert and oriented to person, place, and time.  Skin: Skin is warm and dry.  Psychiatric: She has a normal mood and affect. Her behavior is normal.  Nursing note and vitals reviewed.  Vitals:   07/02/18 1030  BP: 115/71  Pulse: 72  Temp: 98.2 F (36.8 C)  TempSrc: Oral  SpO2: 97%  Weight: 176 lb 12.8 oz (80.2 kg)  Height: 5\' 2"  (1.575 m)      Assessment & Plan:   Jennifer [name] is a 62 y.o. female Situational anxiety - Plan: busPIRone (BUSPAR) 5 MG tablet, DISCONTINUED: busPIRone (BUSPAR) 5 MG tablet  Insomnia, unspecified type - Plan: traZODone (DESYREL) 50 MG tablet  -Improving with BuSpar.  Increase dose to 3 times daily.  Trazodone at bedtime as needed for insomnia, but recommended meeting with therapist as still suspect significant component of anxiety.  Recheck 4 weeks.   Meds ordered this encounter  Medications  . DISCONTD: busPIRone (BUSPAR) 5 MG tablet    Sig: Take 1 tablet (5 mg total) by mouth 2 (two) times daily.    Dispense:  60 tablet    Refill:  1  . traZODone (DESYREL) 50 MG tablet    Sig: Take 0.5-1 tablets (25-50 mg total) by mouth  at bedtime as needed for sleep.    Dispense:  30 tablet    Refill:  3  . busPIRone (BUSPAR) 5 MG tablet    Sig: Take 1 tablet (5 mg total) by mouth 3 (three) times daily.    Dispense:  270 tablet    Refill:  1   [name] Instructions   I'm glad to hear that Buspar is working well, but higher dose may be helpful. Increase to 3 times per day.    Can try trazodone at bedtime to see that helps, but please call therapist to discuss insomnia further as I suspect some of worry/anxiety may be a factor. Sleepy time tea and melatonin for now. Follow up in next 4 weeks to recheck sleep.   [facility]: [phone]  Mammogram can be scheduled at 1 year, but would not recommend waiting more than 2 years.  Check with your pharmacy as there should still be refills available of your medications based on our records.    Insomnia Insomnia is a sleep disorder that makes it difficult to fall asleep or to stay asleep. Insomnia can cause tiredness (fatigue), low energy, difficulty concentrating, mood swings, and poor performance at work or school. There are three different ways to classify insomnia:  Difficulty falling asleep.  Difficulty staying asleep.  Waking up too early in Jennifer morning.  Any type of insomnia can be long-term (chronic) or short-term (acute). Both are common. Short-term insomnia usually lasts for three months or less. Chronic insomnia occurs at least three times a week for longer than three months. What are Jennifer causes? Insomnia may be caused by another condition, situation, or substance, such as:  Anxiety.  Certain medicines.  Gastroesophageal reflux disease (GERD) or other gastrointestinal conditions.  Asthma or other breathing conditions.  Restless legs syndrome, sleep apnea, or other sleep disorders.  Chronic pain.  Menopause. This may include hot flashes.  Stroke.  Abuse of alcohol, tobacco, or illegal  drugs.  Depression.  Caffeine.  Neurological disorders, such as Alzheimer disease.  An overactive thyroid (hyperthyroidism).  Jennifer cause of insomnia may not be known. What increases Jennifer risk? Risk factors for insomnia include:  Gender. Women are more commonly affected than men.  Age. Insomnia is more common as you get older.  Stress. This may involve your professional or personal life.  Income. Insomnia is more common in people with lower income.  Lack of exercise.  Irregular work schedule or night shifts.  Traveling between different time zones.  What are Jennifer signs or symptoms? If you have insomnia, trouble falling asleep or trouble staying asleep is Jennifer main symptom. This may lead to other symptoms, such as:  Feeling fatigued.  Feeling nervous about going to sleep.  Not feeling rested in Jennifer morning.  Having trouble concentrating.  Feeling irritable, anxious, or depressed.  How is this treated? Treatment for insomnia depends on Jennifer cause. If your insomnia is caused by an underlying condition, treatment will focus on addressing Jennifer condition. Treatment may also include:  Medicines to help you sleep.  Counseling or therapy.  Lifestyle adjustments.  Follow these instructions at home:  Take medicines only as directed by your health care provider.  Keep regular sleeping and waking hours. Avoid naps.  Keep a sleep diary to help you and your health care provider figure out what could be causing your insomnia. Include: ? When you sleep. ? When you wake up during Jennifer night. ? How well you sleep. ? How rested you feel Jennifer next day. ? Any side effects of medicines you are taking. ? What you eat and drink.  Make your bedroom a comfortable place where it is easy to fall asleep: ? Put up shades or special blackout curtains to block light from outside. ? Use a white noise machine to block noise. ? Keep Jennifer temperature cool.  Exercise regularly as directed by  your health care provider. Avoid exercising right before bedtime.  Use relaxation techniques to manage stress. Ask your health care provider to suggest some techniques that may work well for you. These may include: ? Breathing exercises. ? Routines to release muscle tension. ? Visualizing peaceful scenes.  Cut back on alcohol, caffeinated beverages, and cigarettes, especially close to bedtime. These can disrupt your sleep.  Do not overeat or eat spicy foods right before bedtime. This can lead to digestive discomfort that can make it hard for you to sleep.  Limit screen use before bedtime. This includes: ? Watching TV. ? Using your smartphone, tablet, and computer.  Stick to a routine. This can help you fall asleep faster. Try to do a quiet activity, brush your teeth, and go to bed at Jennifer same time each night.  Get out of bed if you are still awake after 15 minutes of trying to sleep. Keep Jennifer lights down, but try reading or doing a quiet activity. When you feel sleepy, go back to bed.  Make sure that you drive carefully. Avoid driving if you feel very sleepy.  Keep all follow-up appointments as directed by your health care provider. This is important. Contact a health care provider if:  You are tired throughout Jennifer day or have trouble in your daily routine due to sleepiness.  You continue to have sleep problems or your sleep problems get worse. Get help right away if:  You have serious thoughts about hurting  yourself or someone else. This information is not intended to replace advice given to you by your health care provider. Make sure you discuss any questions you have with your health care provider. Document Released: 08/08/2000 Document Revised: 01/11/2016 Document Reviewed: 05/12/2014 Elsevier Interactive [name] Education  [employer].   If you have lab work done today you will be contacted with your lab results within Jennifer next 2 weeks.  If you have not heard from [place]  then please contact us. Jennifer fastest way to get your results is to register for My Chart.   IF you received an x-ray today, you will receive an invoice from [facility]. Please contact [facility] at [phone] with questions or concerns regarding your invoice.   IF you received labwork today, you will receive an invoice from [place]. Please contact [facility] at [phone] with questions or concerns regarding your invoice.   Our billing staff will not be able to assist you with questions regarding bills from these companies.  You will be contacted with Jennifer lab results as soon as they are available. Jennifer fastest way to get your results is to activate your My Chart account. Instructions are located on Jennifer last page of this paperwork. If you have not heard from [place] regarding Jennifer results in 2 weeks, please contact this office.       I personally performed Jennifer services described in this documentation, which was scribed in my presence. Jennifer recorded information has been reviewed and considered for accuracy and completeness, addended by me as needed, and agree with information above.  Signed,   Merri Ray, MD Primary Care at [place].  07/04/18 9:48 AM

## 2018-07-02 NOTE — Patient Instructions (Addendum)
I'm glad to hear that Buspar is working well, but higher dose may be helpful. Increase to 3 times per day.    Can try trazodone at bedtime to see that helps, but please call therapist to discuss insomnia further as I suspect some of worry/anxiety may be a factor. Sleepy time tea and melatonin for now. Follow up in next 4 weeks to recheck sleep.   [facility]: [phone]  Mammogram can be scheduled at 1 year, but would not recommend waiting more than 2 years.  Check with your pharmacy as there should still be refills available of your medications based on our records.    Insomnia Insomnia is a sleep disorder that makes it difficult to fall asleep or to stay asleep. Insomnia can cause tiredness (fatigue), low energy, difficulty concentrating, mood swings, and poor performance at work or school. There are three different ways to classify insomnia:  Difficulty falling asleep.  Difficulty staying asleep.  Waking up too early in the morning.  Any type of insomnia can be long-term (chronic) or short-term (acute). Both are common. Short-term insomnia usually lasts for three months or less. Chronic insomnia occurs at least three times a week for longer than three months. What are the causes? Insomnia may be caused by another condition, situation, or substance, such as:  Anxiety.  Certain medicines.  Gastroesophageal reflux disease (GERD) or other gastrointestinal conditions.  Asthma or other breathing conditions.  Restless legs syndrome, sleep apnea, or other sleep disorders.  Chronic pain.  Menopause. This may include hot flashes.  Stroke.  Abuse of alcohol, tobacco, or illegal drugs.  Depression.  Caffeine.  Neurological disorders, such as Alzheimer disease.  An overactive thyroid (hyperthyroidism).  The cause of insomnia may not be known. What increases the risk? Risk factors for insomnia include:  Gender. Women are more commonly affected than  men.  Age. Insomnia is more common as you get older.  Stress. This may involve your professional or personal life.  Income. Insomnia is more common in people with lower income.  Lack of exercise.  Irregular work schedule or night shifts.  Traveling between different time zones.  What are the signs or symptoms? If you have insomnia, trouble falling asleep or trouble staying asleep is the main symptom. This may lead to other symptoms, such as:  Feeling fatigued.  Feeling nervous about going to sleep.  Not feeling rested in the morning.  Having trouble concentrating.  Feeling irritable, anxious, or depressed.  How is this treated? Treatment for insomnia depends on the cause. If your insomnia is caused by an underlying condition, treatment will focus on addressing the condition. Treatment may also include:  Medicines to help you sleep.  Counseling or therapy.  Lifestyle adjustments.  Follow these instructions at home:  Take medicines only as directed by your health care provider.  Keep regular sleeping and waking hours. Avoid naps.  Keep a sleep diary to help you and your health care provider figure out what could be causing your insomnia. Include: ? When you sleep. ? When you wake up during the night. ? How well you sleep. ? How rested you feel the next day. ? Any side effects of medicines you are taking. ? What you eat and drink.  Make your bedroom a comfortable place where it is easy to fall asleep: ? Put up shades or special blackout curtains to block light from outside. ? Use a white noise machine to block noise. ? Keep the temperature cool.  Exercise  regularly as directed by your health care provider. Avoid exercising right before bedtime.  Use relaxation techniques to manage stress. Ask your health care provider to suggest some techniques that may work well for you. These may include: ? Breathing exercises. ? Routines to release muscle  tension. ? Visualizing peaceful scenes.  Cut back on alcohol, caffeinated beverages, and cigarettes, especially close to bedtime. These can disrupt your sleep.  Do not overeat or eat spicy foods right before bedtime. This can lead to digestive discomfort that can make it hard for you to sleep.  Limit screen use before bedtime. This includes: ? Watching TV. ? Using your smartphone, tablet, and computer.  Stick to a routine. This can help you fall asleep faster. Try to do a quiet activity, brush your teeth, and go to bed at the same time each night.  Get out of bed if you are still awake after 15 minutes of trying to sleep. Keep the lights down, but try reading or doing a quiet activity. When you feel sleepy, go back to bed.  Make sure that you drive carefully. Avoid driving if you feel very sleepy.  Keep all follow-up appointments as directed by your health care provider. This is important. Contact a health care provider if:  You are tired throughout the day or have trouble in your daily routine due to sleepiness.  You continue to have sleep problems or your sleep problems get worse. Get help right away if:  You have serious thoughts about hurting yourself or someone else. This information is not intended to replace advice given to you by your health care provider. Make sure you discuss any questions you have with your health care provider. Document Released: 08/08/2000 Document Revised: 01/11/2016 Document Reviewed: 05/12/2014 Elsevier Interactive Patient Education  [employer].   If you have lab work done today you will be contacted with your lab results within the next 2 weeks.  If you have not heard from [place] then please contact us. The fastest way to get your results is to register for My Chart.   IF you received an x-ray today, you will receive an invoice from [facility]. Please contact [facility] at [phone] with questions or concerns regarding  your invoice.   IF you received labwork today, you will receive an invoice from [place]. Please contact [facility] at [phone] with questions or concerns regarding your invoice.   Our billing staff will not be able to assist you with questions regarding bills from these companies.  You will be contacted with the lab results as soon as they are available. The fastest way to get your results is to activate your My Chart account. Instructions are located on the last page of this paperwork. If you have not heard from [place] regarding the results in 2 weeks, please contact this office.

## 2018-07-24 ENCOUNTER — Other Ambulatory Visit: Payer: Self-pay | Admitting: Family Medicine

## 2018-07-24 DIAGNOSIS — G4700 Insomnia, unspecified: Secondary | ICD-10-CM

## 2018-07-26 NOTE — Telephone Encounter (Signed)
Requested medication (s) are due for refill today: Yes  Requested medication (s) are on the active medication list: Yes  Last refill:  07/02/18  Future visit scheduled: Yes  Notes to clinic:  Diagnosis code needed for previous Rx sent on 07/02/18; pharmacy requesting 90 day supply.     Requested Prescriptions  Pending Prescriptions Disp Refills   traZODone (DESYREL) 50 MG tablet [Pharmacy Med Name: TRAZODONE 50 MG TABLET] 90 tablet 2    Sig: Take 0.5-1 tablets (25-50 mg total) by mouth at bedtime as needed for sleep.     Psychiatry: Antidepressants - Serotonin Modulator Passed - 07/24/2018 12:35 PM      Passed - Valid encounter within last 6 months    Recent Outpatient Visits          3 weeks ago Situational anxiety   Primary Care at [place], [place]   2 months ago Essential hypertension   Primary Care at [place], [place]   4 months ago Essential hypertension   Primary Care at [facility], Renette Butters, MD   7 months ago Essential hypertension   Primary Care at [facility], Renette Butters, MD   8 months ago Essential hypertension   Primary Care at [facility], Renette Butters, MD      Future Appointments            In 2 months Carlota Raspberry Ranell Patrick, MD Primary Care at [place], [facility]

## 2018-08-02 ENCOUNTER — Other Ambulatory Visit: Payer: Self-pay | Admitting: Family Medicine

## 2018-08-03 NOTE — Telephone Encounter (Signed)
Unfortunately I do not see this medication has been discussed recently. She appears to have been on Topamax for migraines in the past.  If this is reason for refill, please follow-up to discuss medication regimen and can discuss refill of med at that time. Thanks.

## 2018-08-03 NOTE — Telephone Encounter (Signed)
Please advise 

## 2018-09-02 ENCOUNTER — Other Ambulatory Visit: Payer: Self-pay | Admitting: Family Medicine

## 2018-09-02 NOTE — Telephone Encounter (Signed)
Requested medication (s) are due for refill today -yes  Requested medication (s) are on the active medication list -yes  Future visit scheduled -yes  Last refill: 03/13/18  Notes to clinic: Patient is requesting a refill that was previously filled by Dr Tamala Julian- it meets protocol- but will need new prescriber.  Requested Prescriptions  Pending Prescriptions Disp Refills   atorvastatin (LIPITOR) 10 MG tablet 90 tablet 1    Sig: Take 1 tablet (10 mg total) by mouth daily.     Cardiovascular:  Antilipid - Statins Failed - 09/02/2018  1:13 PM      Failed - Triglycerides in normal range and within 360 days    Triglycerides  Date Value Ref Range Status  03/15/2018 250 (H) 0 - 149 mg/dL Final         Passed - Total Cholesterol in normal range and within 360 days    Cholesterol, Total  Date Value Ref Range Status  03/15/2018 188 100 - 199 mg/dL Final         Passed - LDL in normal range and within 360 days    LDL Calculated  Date Value Ref Range Status  03/15/2018 94 0 - 99 mg/dL Final         Passed - HDL in normal range and within 360 days    HDL  Date Value Ref Range Status  03/15/2018 44 >39 mg/dL Final         Passed - Patient is not pregnant      Passed - Valid encounter within last 12 months    Recent Outpatient Visits          2 months ago Situational anxiety   [facility] at [facility], Ranell Patrick, MD   3 months ago Essential hypertension   Primary [facility] at [place], [place]   5 months ago Essential hypertension   [facility] at [facility], Renette Butters, MD   8 months ago Essential hypertension   Primary Care at [facility], Renette Butters, MD   9 months ago Essential hypertension   Primary Care at [facility], Renette Butters, MD      Future Appointments            In 1 month Wendie Agreste, MD Primary [facility] at [place], [facility]            Requested Prescriptions  Pending Prescriptions Disp Refills   atorvastatin (LIPITOR) 10 MG tablet 90 tablet 1    Sig:  Take 1 tablet (10 mg total) by mouth daily.     Cardiovascular:  Antilipid - Statins Failed - 09/02/2018  1:13 PM      Failed - Triglycerides in normal range and within 360 days    Triglycerides  Date Value Ref Range Status  03/15/2018 250 (H) 0 - 149 mg/dL Final         Passed - Total Cholesterol in normal range and within 360 days    Cholesterol, Total  Date Value Ref Range Status  03/15/2018 188 100 - 199 mg/dL Final         Passed - LDL in normal range and within 360 days    LDL Calculated  Date Value Ref Range Status  03/15/2018 94 0 - 99 mg/dL Final         Passed - HDL in normal range and within 360 days    HDL  Date Value Ref Range Status  03/15/2018 44 >39 mg/dL Final  Passed - Patient is not pregnant      Passed - Valid encounter within last 12 months    Recent Outpatient Visits          2 months ago Situational anxiety   [facility] at [place], [place]   3 months ago Essential hypertension   Primary Care at [place], [place]   5 months ago Essential hypertension   Primary Care at [facility], Renette Butters, MD   8 months ago Essential hypertension   [facility] at [facility], Renette Butters, MD   9 months ago Essential hypertension   Primary [facility] at [facility], Renette Butters, MD      Future Appointments            In 1 month Carlota Raspberry Ranell Patrick, MD Primary Care at [place], [facility]

## 2018-09-02 NOTE — Telephone Encounter (Signed)
Copied from [place] [id number]. Topic: Quick Communication - Rx Refill/Question >> Sep 02, 2018 12:54 PM Percell Belt A wrote: Medication: atorvastatin (LIPITOR) 10 MG tablet [[id number]]  Has the patient contacted their pharmacy? Yes.  She has an appt on 10/04/2018 but she is not going to have enough to make it til the 14th (Agent: If no, request that the patient contact the pharmacy for the refill.) (Agent: If yes, when and what did the pharmacy advise?)  Preferred Pharmacy (with phone number or street name): [id number] - [place], [address] [phone] (Phone)   Agent: Please be advised that RX refills may take up to 3 business days. We ask that you follow-up with your pharmacy.

## 2018-09-06 MED ORDER — ATORVASTATIN CALCIUM 10 MG PO TABS: 10.0000 mg | ORAL_TABLET | Freq: Every day | ORAL | 1 refills | Status: DC

## 2018-10-04 ENCOUNTER — Telehealth: Payer: Self-pay | Admitting: Family Medicine

## 2018-10-04 ENCOUNTER — Encounter: Payer: Self-pay | Admitting: Family Medicine

## 2018-10-04 ENCOUNTER — Ambulatory Visit: Payer: BLUE CROSS/BLUE SHIELD | Admitting: Family Medicine

## 2018-10-04 VITALS — BP 140/87 | HR 95 | Temp 99.1°F | Resp 14 | Ht 62.0 in | Wt 180.2 lb

## 2018-10-04 DIAGNOSIS — E7800 Pure hypercholesterolemia, unspecified: Secondary | ICD-10-CM

## 2018-10-04 DIAGNOSIS — T485X5A Adverse effect of other anti-common-cold drugs, initial encounter: Secondary | ICD-10-CM

## 2018-10-04 DIAGNOSIS — H9319 Tinnitus, unspecified ear: Secondary | ICD-10-CM

## 2018-10-04 DIAGNOSIS — H9190 Unspecified hearing loss, unspecified ear: Secondary | ICD-10-CM

## 2018-10-04 DIAGNOSIS — J019 Acute sinusitis, unspecified: Secondary | ICD-10-CM | POA: Diagnosis not present

## 2018-10-04 DIAGNOSIS — N3281 Overactive bladder: Secondary | ICD-10-CM

## 2018-10-04 DIAGNOSIS — Z1211 Encounter for screening for malignant neoplasm of colon: Secondary | ICD-10-CM | POA: Diagnosis not present

## 2018-10-04 DIAGNOSIS — G43709 Chronic migraine without aura, not intractable, without status migrainosus: Secondary | ICD-10-CM | POA: Diagnosis not present

## 2018-10-04 DIAGNOSIS — F439 Reaction to severe stress, unspecified: Secondary | ICD-10-CM

## 2018-10-04 DIAGNOSIS — G4700 Insomnia, unspecified: Secondary | ICD-10-CM

## 2018-10-04 DIAGNOSIS — R0981 Nasal congestion: Secondary | ICD-10-CM

## 2018-10-04 DIAGNOSIS — I10 Essential (primary) hypertension: Secondary | ICD-10-CM

## 2018-10-04 DIAGNOSIS — R7989 Other specified abnormal findings of blood chemistry: Secondary | ICD-10-CM

## 2018-10-04 DIAGNOSIS — J310 Chronic rhinitis: Secondary | ICD-10-CM

## 2018-10-04 MED ORDER — ATORVASTATIN CALCIUM 10 MG PO TABS: 10.0000 mg | ORAL_TABLET | Freq: Every day | ORAL | 1 refills | Status: DC

## 2018-10-04 MED ORDER — TOPIRAMATE 50 MG PO TABS: 50.0000 mg | ORAL_TABLET | Freq: Two times a day (BID) | ORAL | 1 refills | Status: DC

## 2018-10-04 MED ORDER — AMLODIPINE BESYLATE 5 MG PO TABS: 5.0000 mg | ORAL_TABLET | Freq: Every day | ORAL | 1 refills | Status: DC

## 2018-10-04 MED ORDER — LOSARTAN POTASSIUM 50 MG PO TABS: 50.0000 mg | ORAL_TABLET | Freq: Every day | ORAL | 1 refills | Status: DC

## 2018-10-04 MED ORDER — OXYBUTYNIN CHLORIDE ER 5 MG PO TB24: 5.0000 mg | ORAL_TABLET | Freq: Every day | ORAL | 1 refills | Status: DC

## 2018-10-04 MED ORDER — BUTALBITAL-ACETAMINOPHEN 50-325 MG PO TABS: 1.0000 | ORAL_TABLET | Freq: Three times a day (TID) | ORAL | 0 refills | Status: DC | PRN

## 2018-10-04 NOTE — Telephone Encounter (Signed)
Copied from [place]. Topic: Quick Communication - Rx Refill/Question >> Oct 04, 2018  1:24 PM Rayann Heman wrote: Medication: pt called and stated that an antibiotic was going to be sent in. Pt states that all other medications have been sent in but not antibiotic. Please advise

## 2018-10-04 NOTE — Progress Notes (Signed)
Subjective:    Patient ID: Jennifer [name], female    DOB: 1955/12/01, 63 y.o.   MRN: [id number]  HPI Jennifer [name] is a 63 y.o. female Presents today for: Chief Complaint  Patient presents with  . Insomnia    was last seen 07/02/18 for insomnia last visit increased buspar to 3xday and trazodon is working alot better. Have not made an appt to see physcologist as requested  . Sinusitis    have been taking otc medication which has elevated my bp. Sinus pain, mucus, 2 wks ago. Just want you to take a look, doing a little better on the otc med. Also need left ears check for pain   Situational anxiety with insomnia: Discussed in November 2019.  Had been on BuSpar 5 mg twice daily, hydroxyzine 1/2 to 1 tablet of 25 mg nightly as needed.  Did not tolerate hydroxyzine.  Primarily insomnia symptoms at that visit.  Minimal relief with melatonin and sleepy time tea.  Started on trazodone 50 mg 1/2-1 at bedtime, BuSpar was increased to 3 times per day.  Also recommended meeting with psychologist/therapist and phone number was provided.  On Buspar tid now, trazodone 50 mg at bedtime. Feels like night and day difference with this and hydroxyzine. Sleeping better. Did not meet with therapist yet. Not sure if necessary at this time. No crying spells since staring buspar. More relaxed now and handling stressors better. Sister still sick, financial constraints.   Feels like hearing is getting worse  - tinnitus for 10 years, gradual loss for years.  No acute changes.  No recent ENT eval.  Home BP 124-149/74-98.   Sinus symptoms: Sinus congestion 4 weeks ago with face pain. Taking otc [facility] sinus medicine pills, and has been using phenylephrine NS once per day for awhile, but was able to go a few days or up to a week without it, then congestion would restart.  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  Using consistently for 4 weeks.  Fever measured at home? 98.8 (usually 97). Discolored yellow-greee  nasal d/c past 4 weeks. Sinus sx's about the same. No recent antibiotics. Still some face pain.  abx and flonase last year for similar sx's. helped sx's then.  Has continued flonase.   Also requesting refills of ditropan, topamax and cholesterol testing.   Migraine headaches: Takes Topamax 7m qd (not BID - unknown reason). No new side effects.  Last migraine 2 weeks ago.  2-3 migraines per month. Weather triggers migraines.  Request refill of butalbital/acetaminophen - 50/3263m#45 on 04/01/17. Takes multiple goody powders. Has not met with migraine specialist recently.  Saw [facility] prior, 15 years ago - trouble with follow up.   imitrex didn't work anymore.   Chronic urinary frequency/urgency: Started on Ditropan in April of last year for chronic urgency/frequency.  Recommended urology consultation but was declined at that time.  Did notate that if not improving with Ditropan would want urology consultation.  Felt like bladder full all the time, but symptoms improved with current use. Now taking once at bedtime with relief of symptoms and tolerating current dose.   Elevated creatinine: Lab Results  Component Value Date   CREATININE 1.15 (H) 03/15/2018  Dr. SmTamala Juliandvised patient to avoid anti-inflammatories as much as possible including avoiding meloxicam unless absolutely necessary.  Plan for recheck today. Not drinking much water. Sometimes only 1 cup of coffee per day.   Hyperlipidemia:  Lab Results  Component Value Date  CHOL 188 03/15/2018   HDL 44 03/15/2018   LDLCALC 94 03/15/2018   TRIG 250 (H) 03/15/2018   CHOLHDL 4.3 03/15/2018   Lab Results  Component Value Date   ALT 23 03/15/2018   AST 22 03/15/2018   ALKPHOS 66 03/15/2018   BILITOT 0.3 03/15/2018  Currently on Lipitor 10 mg daily. Fasting today. No new myalgias.   Hypertension: BP Readings from Last 3 Encounters:  10/04/18 140/87  07/02/18 115/71  05/19/18 135/81   Lab Results  Component Value  Date   CREATININE 1.15 (H) 03/15/2018  Some elevated readings at home noted as above, but has been taking cold medications daily for sinus symptoms.  Currently on amlodipine 5 mg daily, losartan 50 mg daily Before use of cold meds, BP in 120's/70's.. no new side effects of meds.     Patient Active Problem List   Diagnosis Date Noted  . Essential hypertension 06/13/2015  . Bilateral chronic knee pain 10/14/2013  . Chronic bilateral low back pain without sciatica 11/01/2012  . S/P laparoscopic appendectomy 07/28/2012  . Migraines 09/24/2011  . Hyperlipidemia 09/24/2011   Past Medical History:  Diagnosis Date  . Hyperlipidemia   . Hypertension   . Migraine    Past Surgical History:  Procedure Laterality Date  . APPENDECTOMY    . LAPAROSCOPIC APPENDECTOMY  07/15/2012   Procedure: APPENDECTOMY LAPAROSCOPIC;  Surgeon: Zenovia Jarred, MD;  Location: [facility];  Service: General;  Laterality: N/A;  Laparoscopic Appendectomy   No Known Allergies Prior to Admission medications   Medication Sig Start Date End Date Taking? Authorizing Provider  ACETAMINOPHEN-BUTALBITAL 50-325 MG TABS Take 1-2 tablets by mouth every 8 (eight) hours as needed. 04/01/17  Yes Wardell Honour, MD  amLODipine (NORVASC) 5 MG tablet Take 1 tablet (5 mg total) by mouth daily. 05/19/18  Yes Jaynee Eagles, PA-C  atorvastatin (LIPITOR) 10 MG tablet Take 1 tablet (10 mg total) by mouth daily. 09/06/18  Yes Wendie Agreste, MD  busPIRone (BUSPAR) 5 MG tablet Take 1 tablet (5 mg total) by mouth 3 (three) times daily. 07/02/18  Yes Wendie Agreste, MD  fluticasone (FLONASE) 50 MCG/ACT nasal spray Place 2 sprays into both nostrils daily. 11/23/17  Yes Wardell Honour, MD  losartan (COZAAR) 50 MG tablet Take 1 tablet (50 mg total) by mouth daily. 03/15/18  Yes Wardell Honour, MD  meloxicam (MOBIC) 15 MG tablet TAKE 1/2-1 TABLET BY MOUTH DAILY 02/06/18  Yes Weber, Damaris Hippo, PA-C  Omega-3 Fatty Acids (OMEGA-3 FISH OIL PO) Take 1  capsule by mouth daily at 6 (six) AM.   Yes [provider]  oxybutynin (DITROPAN-XL) 5 MG 24 hr tablet Take 1 tablet (5 mg total) by mouth at bedtime. 03/16/18  Yes Wardell Honour, MD  topiramate (TOPAMAX) 50 MG tablet Take 1 tablet (50 mg total) by mouth 2 (two) times daily. 03/16/18  Yes Wardell Honour, MD  traMADol (ULTRAM) 50 MG tablet Take 1 tablet (50 mg total) by mouth every 8 (eight) hours as needed for moderate pain or severe pain. 05/19/18  Yes Jaynee Eagles, PA-C  traZODone (DESYREL) 50 MG tablet TAKE 0.5-1 TABLETS (25-50 MG TOTAL) BY MOUTH AT BEDTIME AS NEEDED FOR SLEEP. 07/27/18  Yes Wendie Agreste, MD   Social History   Socioeconomic History  . Marital status: Widowed    Spouse name: Not on file  . Number of children: 2  . Years of education: Not on file  . Highest education  level: Not on file  Occupational History  . Occupation: retail    Comment: [employer] in [place], [place]  . Financial resource strain: Not on file  . Food insecurity:    Worry: Not on file    Inability: Not on file  . Transportation needs:    Medical: Not on file    Non-medical: Not on file  Tobacco Use  . Smoking status: Never Smoker  . Smokeless tobacco: Never Used  Substance and Sexual Activity  . Alcohol use: Yes    Comment: 2 drinks once every 2-3 months  . Drug use: No  . Sexual activity: Not on file  Lifestyle  . Physical activity:    Days per week: Not on file    Minutes per session: Not on file  . Stress: Not on file  Relationships  . Social connections:    Talks on phone: Not on file    Gets together: Not on file    Attends religious service: Not on file    Active member of club or organization: Not on file    Attends meetings of clubs or organizations: Not on file    Relationship status: Not on file  . Intimate partner violence:    Fear of current or ex partner: Not on file    Emotionally abused: Not on file    Physically abused: Not on file    Forced  sexual activity: Not on file  Other Topics Concern  . Not on file  Social History Narrative   Marital status: widowed since 2008; husband was bipolar and alcoholic      Children: 2 children (28, 14); son lives with patient.Daughter lives in [place]/autism/does not drive.  Daughter lives with mother-in-law.  One grandpuppy      Lives: with son      Employment: works at [employer]; also works in [place].      Tobacco: none      Alcohol: none; husband was an alcoholic.      Drugs:   none      Exercise:    Review of Systems     Objective:   Physical Exam Vitals signs reviewed.  Constitutional:      General: She is not in acute distress.    Appearance: She is well-developed.  HENT:     Head: Normocephalic and atraumatic.     Right Ear: Hearing, tympanic membrane, ear canal and external ear normal. There is impacted cerumen.     Left Ear: Hearing, tympanic membrane, ear canal and external ear normal. There is impacted cerumen (Cerumen present but not impacted bilaterally, able to visualize left TM superiorly without apparent erythema or significant effusion, right TM minimal clear fluid at the base but no erythema, retraction or bulging.).     Nose:     Right Nostril: No septal hematoma.     Left Nostril: No septal hematoma (no perf. irritated appearance R greater than left septum. ).     Right Sinus: Maxillary sinus tenderness and frontal sinus tenderness present.     Left Sinus: Maxillary sinus tenderness and frontal sinus tenderness present.     Mouth/Throat:     Pharynx: No oropharyngeal exudate.  Eyes:     Conjunctiva/sclera: Conjunctivae normal.     Pupils: Pupils are equal, round, and reactive to light.  Neck:     Vascular: No carotid bruit.  Cardiovascular:     Rate and Rhythm: Normal rate and regular rhythm.  Heart sounds: Normal heart sounds. No murmur.  Pulmonary:     Effort: Pulmonary effort is normal. No respiratory distress.     Breath  sounds: Normal breath sounds. No wheezing or rhonchi.  Abdominal:     Palpations: Abdomen is soft. There is no pulsatile mass.     Tenderness: There is no abdominal tenderness.  Skin:    General: Skin is warm and dry.     Findings: No rash.  Neurological:     Mental Status: She is alert and oriented to person, place, and time.  Psychiatric:        Behavior: Behavior normal.    Vitals:   10/04/18 1111  BP: 140/87  Pulse: 95  Resp: 14  Temp: 99.1 F (37.3 C)  TempSrc: Oral  SpO2: 98%  Weight: 180 lb 3.2 oz (81.7 kg)  Height: '5\' 2"'  (1.575 m)    Over 65mns face to face care with concerns addressed above, with additional 152ms chart review. Greater than 50%counseling.     Assessment & Plan:    KaJAMICE CARRENOs a [age].o. female Acute sinusitis, recurrence not specified, unspecified location Nasal congestion due to prolonged use of decongestants  -Suspect secondary acute sinusitis with previous overuse of nasal decongestants, likely has component of rhinitis medicamentosa.  -Continue Flonase, start saline nasal spray, start Augmentin for acute sinusitis, try wean of nasal decongestant to every other day for 1 to 2 weeks followed by every third day if tolerated.  If persistent need for decongestant or not improving symptoms would recommend eval by ENT.  Pure hypercholesterolemia - Plan: Comprehensive metabolic panel  -Tolerating Lipitor same dose.  Labs pending  Chronic migraine without aura without status migrainosus, not intractable - Plan: ACETAMINOPHEN-BUTALBITAL 50-325 MG TABS, topiramate (TOPAMAX) 50 MG tablet  -Can have multiple contributors to her headaches including decreased fluid intake, stress/tension headache, sinus headache, and is only taking Topamax once per day.  -Treat sinus symptoms as above  -Increase Topamax to twice daily  -Refilled butalbital acetaminophen for now  -Increase hydration  -Consider headache specialist eval if persistent/frequent  migraines.  Overactive bladder - Plan: oxybutynin (DITROPAN-XL) 5 MG 24 hr tablet  -Stable with Ditropan, denies any side effects.  Continue same  Insomnia, unspecified type Situational stress  -Improved with trazodone, and 3 times daily dosing of BuSpar.  No changes at this time.  As the symptoms improve, expect headaches to also improve  Essential hypertension  -Elevated readings recently likely due to over-the-counter decongestant use with sinus symptoms.  Decreased decongestants, continue same regimen for now, RTC precautions if persistent elevated readings  Elevated serum creatinine  -Possible multiple contributors including underlying hypertension, decreased fluid intake, previous use of NSAIDs.  Repeat creatinine, increase hydration, avoid NSAIDs.  Decreased hearing/tinnitus  -Reports longstanding symptoms without recent changes..  Possible contribution from sinus symptoms as minimal clear effusion seen at base of right TM.  Nonobstructive cerumen bilaterally.  Consider ENT eval, but [facility] she plans initially, then follow-up with ENT if sinus symptoms or not improving.   -Trial of isoflavonoids, and handout given on tinnitus.  RTC precautions if acute changes.  Meds ordered this encounter  Medications  . ACETAMINOPHEN-BUTALBITAL 50-325 MG TABS    Sig: Take 1-2 tablets by mouth every 8 (eight) hours as needed.    Dispense:  45 each    Refill:  0  . topiramate (TOPAMAX) 50 MG tablet    Sig: Take 1  tablet (50 mg total) by mouth 2 (two) times daily.    Dispense:  180 tablet    Refill:  1  . oxybutynin (DITROPAN-XL) 5 MG 24 hr tablet    Sig: Take 1 tablet (5 mg total) by mouth at bedtime.    Dispense:  90 tablet    Refill:  1  . amLODipine (NORVASC) 5 MG tablet    Sig: Take 1 tablet (5 mg total) by mouth daily.    Dispense:  90 tablet    Refill:  1  . atorvastatin (LIPITOR) 10 MG tablet    Sig: Take 1 tablet (10 mg total) by mouth  daily.    Dispense:  90 tablet    Refill:  1  . losartan (COZAAR) 50 MG tablet    Sig: Take 1 tablet (50 mg total) by mouth daily.    Dispense:  90 tablet    Refill:  1   Patient Instructions    See info on stress management below, but as things are going better, ok to continue trazodone and buspar at same dose.   Can try isoflavinoids for tinnitus, see other info below. I would recommend hearing screen at some point.   Can try antibiotic for possible sinus infection, use saline nasal spray 4-5 times per day if needed. I would like you to wean off the decongestant nasal spray - try using every other day for next week or two, then every third day. If unable to come off that medicine, I would like you to see ear nose and throat specialist. Let me know and I will refer you to Dr. Ernesto Rutherford. He can also discuss the hearing changes and ringing int the ears.   Continue topamax for migraines, but increase to twice per day.  I refilled butalbital for now, but please follow up in 6 weeks to decide if follow up needed with headache specialist for migraines if those are still frequent.   No change in blood pressure or cholesterol med for now. Cutting back on decongestants in sinus medicine  should help with blood pressure.  Increase water intake as we discussed.   Thanks for coming in today.    Sinusitis, Adult Sinusitis is inflammation of your sinuses. Sinuses are hollow spaces in the bones around your face. Your sinuses are located:  Around your eyes.  In the middle of your forehead.  Behind your nose.  In your cheekbones. Mucus normally drains out of your sinuses. When your nasal tissues become inflamed or swollen, mucus can become trapped or blocked. This allows bacteria, viruses, and fungi to grow, which leads to infection. Most infections of the sinuses are caused by a virus. Sinusitis can develop quickly. It can last for up to 4 weeks (acute) or for more than 12 weeks (chronic).  Sinusitis often develops after a cold. What are the causes? This condition is caused by anything that creates swelling in the sinuses or stops mucus from draining. This includes:  Allergies.  Asthma.  Infection from bacteria or viruses.  Deformities or blockages in your nose or sinuses.  Abnormal growths in the nose (nasal polyps).  Pollutants, such as chemicals or irritants in the air.  Infection from fungi (rare). What increases the risk? You are more likely to develop this condition if you:  Have a weak body defense system (immune system).  Do a lot of swimming or diving.  Overuse nasal sprays.  Smoke. What are the signs or symptoms? The main symptoms of this condition  are pain and a feeling of pressure around the affected sinuses. Other symptoms include:  Stuffy nose or congestion.  Thick drainage from your nose.  Swelling and warmth over the affected sinuses.  Headache.  Upper toothache.  A cough that may get worse at night.  Extra mucus that collects in the throat or the back of the nose (postnasal drip).  Decreased sense of smell and taste.  Fatigue.  A fever.  Sore throat.  Bad breath. How is this diagnosed? This condition is diagnosed based on:  Your symptoms.  Your medical history.  A physical exam.  Tests to find out if your condition is acute or chronic. This may include: ? Checking your nose for nasal polyps. ? Viewing your sinuses using a device that has a light (endoscope). ? Testing for allergies or bacteria. ? Imaging tests, such as an MRI or CT scan. In rare cases, a bone biopsy may be done to rule out more serious types of fungal sinus disease. How is this treated? Treatment for sinusitis depends on the cause and whether your condition is chronic or acute.  If caused by a virus, your symptoms should go away on their own within 10 days. You may be given medicines to relieve symptoms. They include: ? Medicines that shrink  swollen nasal passages (topical intranasal decongestants). ? Medicines that treat allergies (antihistamines). ? A spray that eases inflammation of the nostrils (topical intranasal corticosteroids). ? Rinses that help get rid of thick mucus in your nose (nasal saline washes).  If caused by bacteria, your health care provider may recommend waiting to see if your symptoms improve. Most bacterial infections will get better without antibiotic medicine. You may be given antibiotics if you have: ? A severe infection. ? A weak immune system.  If caused by narrow nasal passages or nasal polyps, you may need to have surgery. Follow these instructions at home: Medicines  Take, use, or apply over-the-counter and prescription medicines only as told by your health care provider. These may include nasal sprays.  If you were prescribed an antibiotic medicine, take it as told by your health care provider. Do not stop taking the antibiotic even if you start to feel better. Hydrate and humidify   Drink enough fluid to keep your urine pale yellow. Staying hydrated will help to thin your mucus.  Use a cool mist humidifier to keep the humidity level in your home above 50%.  Inhale steam for 10-15 minutes, 3-4 times a day, or as told by your health care provider. You can do this in the bathroom while a hot shower is running.  Limit your exposure to cool or dry air. Rest  Rest as much as possible.  Sleep with your head raised (elevated).  Make sure you get enough sleep each night. General instructions   Apply a warm, moist washcloth to your face 3-4 times a day or as told by your health care provider. This will help with discomfort.  Wash your hands often with soap and water to reduce your exposure to germs. If soap and water are not available, use hand sanitizer.  Do not smoke. Avoid being around people who are smoking (secondhand smoke).  Keep all follow-up visits as told by your health care  provider. This is important. Contact a health care provider if:  You have a fever.  Your symptoms get worse.  Your symptoms do not improve within 10 days. Get help right away if:  You have a severe headache.  You have persistent vomiting.  You have severe pain or swelling around your face or eyes.  You have vision problems.  You develop confusion.  Your neck is stiff.  You have trouble breathing. Summary  Sinusitis is soreness and inflammation of your sinuses. Sinuses are hollow spaces in the bones around your face.  This condition is caused by nasal tissues that become inflamed or swollen. The swelling traps or blocks the flow of mucus. This allows bacteria, viruses, and fungi to grow, which leads to infection.  If you were prescribed an antibiotic medicine, take it as told by your health care provider. Do not stop taking the antibiotic even if you start to feel better.  Keep all follow-up visits as told by your health care provider. This is important. This information is not intended to replace advice given to you by your health care provider. Make sure you discuss any questions you have with your health care provider. Document Released: 08/11/2005 Document Revised: 01/11/2018 Document Reviewed: 01/11/2018 Elsevier Interactive Patient Education  2019 [place].   Migraine Headache A migraine headache is an intense, throbbing pain on one side or both sides of the head. Migraines may also cause other symptoms, such as nausea, vomiting, and sensitivity to light and noise. What are the causes? Doing or taking certain things may also trigger migraines, such as:  Alcohol.  Smoking.  Medicines, such as: ? Medicine used to treat chest pain (nitroglycerine). ? Birth control pills. ? Estrogen pills. ? Certain blood pressure medicines.  Aged cheeses, chocolate, or caffeine.  Foods or drinks that contain nitrates, glutamate, aspartame, or tyramine.  Physical  activity. Other things that may trigger a migraine include:  Menstruation.  Pregnancy.  Hunger.  Stress, lack of sleep, too much sleep, or fatigue.  Weather changes. What increases the risk? The following factors may make you more likely to experience migraine headaches:  Age. Risk increases with age.  Family history of migraine headaches.  Being Caucasian.  Depression and anxiety.  Obesity.  Being a woman.  Having a hole in the heart (patent foramen ovale) or other heart problems. What are the signs or symptoms? The main symptom of this condition is pulsating or throbbing pain. Pain may:  Happen in any area of the head, such as on one side or both sides.  Interfere with daily activities.  Get worse with physical activity.  Get worse with exposure to bright lights or loud noises. Other symptoms may include:  Nausea.  Vomiting.  Dizziness.  General sensitivity to bright lights, loud noises, or smells. Before you get a migraine, you may get warning signs that a migraine is developing (aura). An aura may include:  Seeing flashing lights or having blind spots.  Seeing bright spots, halos, or zigzag lines.  Having tunnel vision or blurred vision.  Having numbness or a tingling feeling.  Having trouble talking.  Having muscle weakness. How is this diagnosed? A migraine headache can be diagnosed based on:  Your symptoms.  A physical exam.  Tests, such as CT scan or MRI of the head. These imaging tests can help rule out other causes of headaches.  Taking fluid from the spine (lumbar puncture) and analyzing it (cerebrospinal fluid analysis, or CSF analysis). How is this treated? A migraine headache is usually treated with medicines that:  Relieve pain.  Relieve nausea.  Prevent migraines from coming back. Treatment may also include:  Acupuncture.  Lifestyle changes like avoiding foods that trigger migraines. Follow these instructions at  home: Medicines  Take over-the-counter and prescription medicines only as told by your health care provider.  Do not drive or use heavy machinery while taking prescription pain medicine.  To prevent or treat constipation while you are taking prescription pain medicine, your health care provider may recommend that you: ? Drink enough fluid to keep your urine clear or pale yellow. ? Take over-the-counter or prescription medicines. ? Eat foods that are high in fiber, such as fresh fruits and vegetables, whole grains, and beans. ? Limit foods that are high in fat and processed sugars, such as fried and sweet foods. Lifestyle  Avoid alcohol use.  Do not use any products that contain nicotine or tobacco, such as cigarettes and e-cigarettes. If you need help quitting, ask your health care provider.  Get at least 8 hours of sleep every night.  Limit your stress. General instructions      Keep a journal to find out what may trigger your migraine headaches. For example, write down: ? What you eat and drink. ? How much sleep you get. ? Any change to your diet or medicines.  If you have a migraine: ? Avoid things that make your symptoms worse, such as bright lights. ? It may help to lie down in a dark, quiet room. ? Do not drive or use heavy machinery. ? Ask your health care provider what activities are safe for you while you are experiencing symptoms.  Keep all follow-up visits as told by your health care provider. This is important. Contact a health care provider if:  You develop symptoms that are different or more severe than your usual migraine symptoms. Get help right away if:  Your migraine becomes severe.  You have a fever.  You have a stiff neck.  You have vision loss.  Your muscles feel weak or like you cannot control them.  You start to lose your balance often.  You develop trouble walking.  You faint. This information is not intended to replace advice given to  you by your health care provider. Make sure you discuss any questions you have with your health care provider. Document Released: 08/11/2005 Document Revised: 02/29/2016 Document Reviewed: 01/28/2016 Elsevier Interactive Patient Education  2019 [employer].   Tinnitus Tinnitus refers to hearing a sound when there is no actual source for that sound. This is often described as ringing in the ears. However, people with this condition may hear a variety of noises, in one ear or in both ears. The sounds of tinnitus can be soft, loud, or somewhere in between. Tinnitus can last for a few seconds or can be constant for days. It may go away without treatment and come back at various times. When tinnitus is constant or happens often, it can lead to other problems, such as trouble sleeping and trouble concentrating. Almost everyone experiences tinnitus at some point. Tinnitus that is long-lasting (chronic) or comes back often (recurs) may require medical attention. What are the causes? The cause of tinnitus is often not known. In some cases, it can result from other problems or conditions, including:  Exposure to loud noises from machinery, music, or other sources.  Hearing loss.  Ear or sinus infections.  Earwax buildup.  An object (foreign body) stuck in the ear.  Taking certain medicines.  Drinking alcohol or caffeine.  High blood pressure.  Heart diseases.  Anemia.  Allergies.  Meniere's disease.  Thyroid problems.  Tumors.  A weak, bulging blood vessel (aneurysm) near the ear.  Depression  or other mood disorders. What are the signs or symptoms? The main symptom of tinnitus is hearing a sound when there is no source for that sound. It may sound like:  Buzzing.  Roaring.  Ringing.  Blowing air, like the sound heard when you listen to a seashell.  Hissing.  Whistling.  Sizzling.  Humming.  Running water.  A musical note.  Tapping. Symptoms may affect only  one ear (unilateral) or both ears (bilateral). How is this diagnosed? Tinnitus is diagnosed based on your symptoms, your medical history, and a physical exam. Your health care provider may do a thorough hearing test (audiologic exam) if your tinnitus:  Is unilateral.  Causes hearing difficulties.  Lasts 6 months or longer. You may work with a health care provider who specializes in hearing disorders (audiologist). You may be asked questions about your symptoms and how they affect your daily life. You may have other tests done, such as:  CT scan.  MRI.  An imaging test of how blood flows through your blood vessels (angiogram). How is this treated? Treating an underlying medical condition can sometimes make tinnitus go away. If your tinnitus continues, other treatments may include:  Medicines, such as antidepressants or sleeping aids.  Sound generators to mask the tinnitus. These include: ? Tabletop sound machines that play relaxing sounds to help you fall asleep. ? Wearable devices that fit in your ear and play sounds or music. ? Acoustic neural stimulation. This involves using headphones to listen to music that contains an auditory signal. Over time, listening to this signal may change some pathways in your brain and make you less sensitive to tinnitus. This treatment is used for very severe cases when no other treatment is working.  Therapy and counseling to help you manage the stress of living with tinnitus.  Using hearing aids or cochlear implants if your tinnitus is related to hearing loss. Hearing aids are worn in the outer ear. Cochlear implants are surgically placed in the inner ear. Follow these instructions at home: Managing symptoms      When possible, avoid being in loud places and being exposed to loud sounds.  Wear hearing protection, such as earplugs, when you are exposed to loud noises.  Use a white noise machine, a humidifier, or other devices to mask the sound  of tinnitus.  Practice techniques for reducing stress, such as meditation, yoga, or deep breathing. Work with your health care provider if you need help with managing stress.  Sleep with your head slightly raised. This may reduce the impact of tinnitus. General instructions  Do not use stimulants, such as nicotine, alcohol, or caffeine. Talk with your health care provider about other stimulants to avoid. Stimulants are substances that can make you feel alert and attentive by increasing certain activities in the body (such as heart rate and blood pressure). These substances may make tinnitus worse.  Take over-the-counter and prescription medicines only as told by your health care provider.  Try to get plenty of sleep each night.  Keep all follow-up visits as told by your health care provider. This is important. Contact a health care provider if:  Your tinnitus continues for 3 weeks or longer without stopping.  Your symptoms get worse or do not get better with home care.  You develop tinnitus after a head injury.  You have tinnitus along with any of the following: ? Dizziness. ? Loss of balance. ? Nausea and vomiting. Summary  Tinnitus refers to hearing a sound when  there is no actual source for that sound. This is often described as ringing in the ears.  Symptoms may affect only one ear (unilateral) or both ears (bilateral).  Use a white noise machine, a humidifier, or other devices to mask the sound of tinnitus.  Do not use stimulants, such as nicotine, alcohol, or caffeine. Talk with your health care provider about other stimulants to avoid. These substances may make tinnitus worse. This information is not intended to replace advice given to you by your health care provider. Make sure you discuss any questions you have with your health care provider. Document Released: 08/11/2005 Document Revised: 05/21/2017 Document Reviewed: 05/21/2017 Elsevier Interactive Patient Education   2019 [place] is a normal reaction to life events. Stress is what you feel when life demands more than you are used to, or more than you think you can handle. Some stress can be useful, such as studying for a test or meeting a deadline at work. Stress that occurs too often or for too long can cause problems. It can affect your emotional health and interfere with relationships and normal daily activities. Too much stress can weaken your body's defense system (immune system) and increase your risk for physical illness. If you already have a medical problem, stress can make it worse. What are the causes? All sorts of life events can cause stress. An event that causes stress for one person may not be stressful for another person. Major life events, whether positive or negative, commonly cause stress. Examples include:  Losing a job or starting a new job.  Losing a loved one.  Moving to a new town or home.  Getting married or divorced.  Having a baby.  Injury or illness. Less obvious life events can also cause stress, especially if they occur day after day or in combination with each other. Examples include:  Working long hours.  Driving in traffic.  Caring for children.  Being in debt.  Being in a difficult relationship. What are the signs or symptoms? Stress can cause emotional symptoms, including:  Anxiety. This is feeling worried, afraid, on edge, overwhelmed, or out of control.  Anger, including irritation or impatience.  Depression. This is feeling sad, down, helpless, or guilty.  Trouble focusing, remembering, or making decisions. Stress can cause physical symptoms, including:  Aches and pains. These may affect your head, neck, back, stomach, or other areas of your body.  Tight muscles or a clenched jaw.  Low energy.  Trouble sleeping. Stress can cause unhealthy behaviors, including:  Eating to feel better (overeating) or skipping  meals.  Working too much or putting off tasks.  Smoking, drinking alcohol, or using drugs to feel better. How is this diagnosed? Stress is diagnosed through an assessment by your health care provider. He or she may diagnose this condition based on:  Your symptoms and any stressful life events.  Your medical history.  Tests to rule out other causes of your symptoms. Depending on your condition, your health care provider may refer you to a specialist for further evaluation. How is this treated?  Stress management techniques are the recommended treatment for stress. Medicine is not typically recommended for the treatment of stress. Techniques to reduce your reaction to stressful life events include:  Stress identification. Monitor yourself for symptoms of stress and identify what causes stress for you. These skills may help you to avoid or prepare for stressful events.  Time management. Set your priorities, keep  a calendar of events, and learn to say "no." Taking these actions can help you avoid making too many commitments. Techniques for coping with stress include:  Rethinking the problem. Try to think realistically about stressful events rather than ignoring them or overreacting. Try to find the positives in a stressful situation rather than focusing on the negatives.  Exercise. Physical exercise can release both physical and emotional tension. The key is to find a form of exercise that you enjoy and do it regularly.  Relaxation techniques. These relax the body and mind. The key is to find one or more that you enjoy and use the technique(s) regularly. Examples include: ? Meditation, deep breathing, or progressive relaxation techniques. ? Yoga or tai chi. ? Biofeedback, mindfulness techniques, or journaling. ? Listening to music, being out in nature, or participating in other hobbies.  Practicing a healthy lifestyle. Eat a balanced diet, drink plenty of water, limit or avoid caffeine,  and get plenty of sleep.  Having a strong support network. Spend time with family, friends, or other people you enjoy being around. Express your feelings and talk things over with someone you trust. Counseling or talk therapy with a mental health professional may be helpful if you are having trouble managing stress on your own. Follow these instructions at home: Lifestyle   Avoid drugs.  Do not use any products that contain nicotine or tobacco, such as cigarettes and e-cigarettes. If you need help quitting, ask your health care provider.  Limit alcohol intake to no more than 1 drink a day for nonpregnant women and 2 drinks a day for men. One drink equals 12 oz of beer, 5 oz of wine, or 1 oz of hard liquor.  Do not use alcohol or drugs to relax.  Eat a balanced diet that includes fresh fruits and vegetables, whole grains, lean meats, fish, eggs, and beans, and low-fat dairy. Avoid processed foods and foods high in added fat, sugar, and salt.  Exercise at least 30 minutes on 5 or more days each week.  Get 7-8 hours of sleep each night. General instructions   Practice stress management techniques as discussed with your health care provider.  Drink enough fluid to keep your urine clear or pale yellow.  Take over-the-counter and prescription medicines only as told by your health care provider.  Keep all follow-up visits as told by your health care provider. This is important. Contact a health care provider if:  Your symptoms get worse.  You have new symptoms.  You feel overwhelmed by your problems and can no longer manage them on your own. Get help right away if:  You have thoughts of hurting yourself or others. If you ever feel like you may hurt yourself or others, or have thoughts about taking your own life, get help right away. You can go to your nearest emergency department or call:  Your local emergency services (911 in the [place]  A suicide crisis helpline, such as the  [facility] at 548-826-9076. This is open 24 hours a day. Summary  Stress is a normal reaction to life events. It can cause problems if it happens too often or for too long.  Practicing stress management techniques is the best way to treat stress.  Counseling or talk therapy with a mental health professional may be helpful if you are having trouble managing stress on your own. This information is not intended to replace advice given to you by your health care provider. Make sure you  discuss any questions you have with your health care provider. Document Released: 02/04/2001 Document Revised: 10/01/2016 Document Reviewed: 10/01/2016 Elsevier Interactive Patient Education  [employer].   If you have lab work done today you will be contacted with your lab results within the next 2 weeks.  If you have not heard from [place] then please contact us. The fastest way to get your results is to register for My Chart.   IF you received an x-ray today, you will receive an invoice from [facility]. Please contact [facility] at [phone] with questions or concerns regarding your invoice.   IF you received labwork today, you will receive an invoice from [facility]. Please contact [facility] at [phone] with questions or concerns regarding your invoice.   Our billing staff will not be able to assist you with questions regarding bills from these companies.  You will be contacted with the lab results as soon as they are available. The fastest way to get your results is to activate your My Chart account. Instructions are located on the last page of this paperwork. If you have not heard from [place] regarding the results in 2 weeks, please contact this office.       Signed,   Merri Ray, MD [facility] [place].  10/04/18 12:48 PM

## 2018-10-04 NOTE — Patient Instructions (Addendum)
See info on stress management below, but as things are going better, ok to continue trazodone and buspar at same dose.   Can try isoflavinoids for tinnitus, see other info below. I would recommend hearing screen at some point.   Can try antibiotic for possible sinus infection, use saline nasal spray 4-5 times per day if needed. I would like you to wean off the decongestant nasal spray - try using every other day for next week or two, then every third day. If unable to come off that medicine, I would like you to see ear nose and throat specialist. Let me know and I will refer you to Dr. Ernesto Rutherford. He can also discuss the hearing changes and ringing int the ears.   Continue topamax for migraines, but increase to twice per day.  I refilled butalbital for now, but please follow up in 6 weeks to decide if follow up needed with headache specialist for migraines if those are still frequent.   No change in blood pressure or cholesterol med for now. Cutting back on decongestants in sinus medicine  should help with blood pressure.  Increase water intake as we discussed.   Thanks for coming in today.    Sinusitis, Adult Sinusitis is inflammation of your sinuses. Sinuses are hollow spaces in the bones around your face. Your sinuses are located:  Around your eyes.  In the middle of your forehead.  Behind your nose.  In your cheekbones. Mucus normally drains out of your sinuses. When your nasal tissues become inflamed or swollen, mucus can become trapped or blocked. This allows bacteria, viruses, and fungi to grow, which leads to infection. Most infections of the sinuses are caused by a virus. Sinusitis can develop quickly. It can last for up to 4 weeks (acute) or for more than 12 weeks (chronic). Sinusitis often develops after a cold. What are the causes? This condition is caused by anything that creates swelling in the sinuses or stops mucus from draining. This  includes:  Allergies.  Asthma.  Infection from bacteria or viruses.  Deformities or blockages in your nose or sinuses.  Abnormal growths in the nose (nasal polyps).  Pollutants, such as chemicals or irritants in the air.  Infection from fungi (rare). What increases the risk? You are more likely to develop this condition if you:  Have a weak body defense system (immune system).  Do a lot of swimming or diving.  Overuse nasal sprays.  Smoke. What are the signs or symptoms? The main symptoms of this condition are pain and a feeling of pressure around the affected sinuses. Other symptoms include:  Stuffy nose or congestion.  Thick drainage from your nose.  Swelling and warmth over the affected sinuses.  Headache.  Upper toothache.  A cough that may get worse at night.  Extra mucus that collects in the throat or the back of the nose (postnasal drip).  Decreased sense of smell and taste.  Fatigue.  A fever.  Sore throat.  Bad breath. How is this diagnosed? This condition is diagnosed based on:  Your symptoms.  Your medical history.  A physical exam.  Tests to find out if your condition is acute or chronic. This may include: ? Checking your nose for nasal polyps. ? Viewing your sinuses using a device that has a light (endoscope). ? Testing for allergies or bacteria. ? Imaging tests, such as an MRI or CT scan. In rare cases, a bone biopsy may be done to rule out more serious  types of fungal sinus disease. How is this treated? Treatment for sinusitis depends on the cause and whether your condition is chronic or acute.  If caused by a virus, your symptoms should go away on their own within 10 days. You may be given medicines to relieve symptoms. They include: ? Medicines that shrink swollen nasal passages (topical intranasal decongestants). ? Medicines that treat allergies (antihistamines). ? A spray that eases inflammation of the nostrils (topical  intranasal corticosteroids). ? Rinses that help get rid of thick mucus in your nose (nasal saline washes).  If caused by bacteria, your health care provider may recommend waiting to see if your symptoms improve. Most bacterial infections will get better without antibiotic medicine. You may be given antibiotics if you have: ? A severe infection. ? A weak immune system.  If caused by narrow nasal passages or nasal polyps, you may need to have surgery. Follow these instructions at home: Medicines  Take, use, or apply over-the-counter and prescription medicines only as told by your health care provider. These may include nasal sprays.  If you were prescribed an antibiotic medicine, take it as told by your health care provider. Do not stop taking the antibiotic even if you start to feel better. Hydrate and humidify   Drink enough fluid to keep your urine pale yellow. Staying hydrated will help to thin your mucus.  Use a cool mist humidifier to keep the humidity level in your home above 50%.  Inhale steam for 10-15 minutes, 3-4 times a day, or as told by your health care provider. You can do this in the bathroom while a hot shower is running.  Limit your exposure to cool or dry air. Rest  Rest as much as possible.  Sleep with your head raised (elevated).  Make sure you get enough sleep each night. General instructions   Apply a warm, moist washcloth to your face 3-4 times a day or as told by your health care provider. This will help with discomfort.  Wash your hands often with soap and water to reduce your exposure to germs. If soap and water are not available, use hand sanitizer.  Do not smoke. Avoid being around people who are smoking (secondhand smoke).  Keep all follow-up visits as told by your health care provider. This is important. Contact a health care provider if:  You have a fever.  Your symptoms get worse.  Your symptoms do not improve within 10 days. Get help  right away if:  You have a severe headache.  You have persistent vomiting.  You have severe pain or swelling around your face or eyes.  You have vision problems.  You develop confusion.  Your neck is stiff.  You have trouble breathing. Summary  Sinusitis is soreness and inflammation of your sinuses. Sinuses are hollow spaces in the bones around your face.  This condition is caused by nasal tissues that become inflamed or swollen. The swelling traps or blocks the flow of mucus. This allows bacteria, viruses, and fungi to grow, which leads to infection.  If you were prescribed an antibiotic medicine, take it as told by your health care provider. Do not stop taking the antibiotic even if you start to feel better.  Keep all follow-up visits as told by your health care provider. This is important. This information is not intended to replace advice given to you by your health care provider. Make sure you discuss any questions you have with your health care provider. Document Released: 08/11/2005  Document Revised: 01/11/2018 Document Reviewed: 01/11/2018 Elsevier Interactive Patient Education  2019 [employer].   Migraine Headache A migraine headache is an intense, throbbing pain on one side or both sides of the head. Migraines may also cause other symptoms, such as nausea, vomiting, and sensitivity to light and noise. What are the causes? Doing or taking certain things may also trigger migraines, such as:  Alcohol.  Smoking.  Medicines, such as: ? Medicine used to treat chest pain (nitroglycerine). ? Birth control pills. ? Estrogen pills. ? Certain blood pressure medicines.  Aged cheeses, chocolate, or caffeine.  Foods or drinks that contain nitrates, glutamate, aspartame, or tyramine.  Physical activity. Other things that may trigger a migraine include:  Menstruation.  Pregnancy.  Hunger.  Stress, lack of sleep, too much sleep, or fatigue.  Weather  changes. What increases the risk? The following factors may make you more likely to experience migraine headaches:  Age. Risk increases with age.  Family history of migraine headaches.  Being Caucasian.  Depression and anxiety.  Obesity.  Being a woman.  Having a hole in the heart (patent foramen ovale) or other heart problems. What are the signs or symptoms? The main symptom of this condition is pulsating or throbbing pain. Pain may:  Happen in any area of the head, such as on one side or both sides.  Interfere with daily activities.  Get worse with physical activity.  Get worse with exposure to bright lights or loud noises. Other symptoms may include:  Nausea.  Vomiting.  Dizziness.  General sensitivity to bright lights, loud noises, or smells. Before you get a migraine, you may get warning signs that a migraine is developing (aura). An aura may include:  Seeing flashing lights or having blind spots.  Seeing bright spots, halos, or zigzag lines.  Having tunnel vision or blurred vision.  Having numbness or a tingling feeling.  Having trouble talking.  Having muscle weakness. How is this diagnosed? A migraine headache can be diagnosed based on:  Your symptoms.  A physical exam.  Tests, such as CT scan or MRI of the head. These imaging tests can help rule out other causes of headaches.  Taking fluid from the spine (lumbar puncture) and analyzing it (cerebrospinal fluid analysis, or CSF analysis). How is this treated? A migraine headache is usually treated with medicines that:  Relieve pain.  Relieve nausea.  Prevent migraines from coming back. Treatment may also include:  Acupuncture.  Lifestyle changes like avoiding foods that trigger migraines. Follow these instructions at home: Medicines  Take over-the-counter and prescription medicines only as told by your health care provider.  Do not drive or use heavy machinery while taking  prescription pain medicine.  To prevent or treat constipation while you are taking prescription pain medicine, your health care provider may recommend that you: ? Drink enough fluid to keep your urine clear or pale yellow. ? Take over-the-counter or prescription medicines. ? Eat foods that are high in fiber, such as fresh fruits and vegetables, whole grains, and beans. ? Limit foods that are high in fat and processed sugars, such as fried and sweet foods. Lifestyle  Avoid alcohol use.  Do not use any products that contain nicotine or tobacco, such as cigarettes and e-cigarettes. If you need help quitting, ask your health care provider.  Get at least 8 hours of sleep every night.  Limit your stress. General instructions      Keep a journal to find out what may trigger your  migraine headaches. For example, write down: ? What you eat and drink. ? How much sleep you get. ? Any change to your diet or medicines.  If you have a migraine: ? Avoid things that make your symptoms worse, such as bright lights. ? It may help to lie down in a dark, quiet room. ? Do not drive or use heavy machinery. ? Ask your health care provider what activities are safe for you while you are experiencing symptoms.  Keep all follow-up visits as told by your health care provider. This is important. Contact a health care provider if:  You develop symptoms that are different or more severe than your usual migraine symptoms. Get help right away if:  Your migraine becomes severe.  You have a fever.  You have a stiff neck.  You have vision loss.  Your muscles feel weak or like you cannot control them.  You start to lose your balance often.  You develop trouble walking.  You faint. This information is not intended to replace advice given to you by your health care provider. Make sure you discuss any questions you have with your health care provider. Document Released: 08/11/2005 Document Revised:  02/29/2016 Document Reviewed: 01/28/2016 Elsevier Interactive Patient Education  2019 [employer].   Tinnitus Tinnitus refers to hearing a sound when there is no actual source for that sound. This is often described as ringing in the ears. However, people with this condition may hear a variety of noises, in one ear or in both ears. The sounds of tinnitus can be soft, loud, or somewhere in between. Tinnitus can last for a few seconds or can be constant for days. It may go away without treatment and come back at various times. When tinnitus is constant or happens often, it can lead to other problems, such as trouble sleeping and trouble concentrating. Almost everyone experiences tinnitus at some point. Tinnitus that is long-lasting (chronic) or comes back often (recurs) may require medical attention. What are the causes? The cause of tinnitus is often not known. In some cases, it can result from other problems or conditions, including:  Exposure to loud noises from machinery, music, or other sources.  Hearing loss.  Ear or sinus infections.  Earwax buildup.  An object (foreign body) stuck in the ear.  Taking certain medicines.  Drinking alcohol or caffeine.  High blood pressure.  Heart diseases.  Anemia.  Allergies.  Meniere's disease.  Thyroid problems.  Tumors.  A weak, bulging blood vessel (aneurysm) near the ear.  Depression or other mood disorders. What are the signs or symptoms? The main symptom of tinnitus is hearing a sound when there is no source for that sound. It may sound like:  Buzzing.  Roaring.  Ringing.  Blowing air, like the sound heard when you listen to a seashell.  Hissing.  Whistling.  Sizzling.  Humming.  Running water.  A musical note.  Tapping. Symptoms may affect only one ear (unilateral) or both ears (bilateral). How is this diagnosed? Tinnitus is diagnosed based on your symptoms, your medical history, and a physical exam.  Your health care provider may do a thorough hearing test (audiologic exam) if your tinnitus:  Is unilateral.  Causes hearing difficulties.  Lasts 6 months or longer. You may work with a health care provider who specializes in hearing disorders (audiologist). You may be asked questions about your symptoms and how they affect your daily life. You may have other tests done, such as:  CT scan.  MRI.  An imaging test of how blood flows through your blood vessels (angiogram). How is this treated? Treating an underlying medical condition can sometimes make tinnitus go away. If your tinnitus continues, other treatments may include:  Medicines, such as antidepressants or sleeping aids.  Sound generators to mask the tinnitus. These include: ? Tabletop sound machines that play relaxing sounds to help you fall asleep. ? Wearable devices that fit in your ear and play sounds or music. ? Acoustic neural stimulation. This involves using headphones to listen to music that contains an auditory signal. Over time, listening to this signal may change some pathways in your brain and make you less sensitive to tinnitus. This treatment is used for very severe cases when no other treatment is working.  Therapy and counseling to help you manage the stress of living with tinnitus.  Using hearing aids or cochlear implants if your tinnitus is related to hearing loss. Hearing aids are worn in the outer ear. Cochlear implants are surgically placed in the inner ear. Follow these instructions at home: Managing symptoms      When possible, avoid being in loud places and being exposed to loud sounds.  Wear hearing protection, such as earplugs, when you are exposed to loud noises.  Use a white noise machine, a humidifier, or other devices to mask the sound of tinnitus.  Practice techniques for reducing stress, such as meditation, yoga, or deep breathing. Work with your health care provider if you need help with  managing stress.  Sleep with your head slightly raised. This may reduce the impact of tinnitus. General instructions  Do not use stimulants, such as nicotine, alcohol, or caffeine. Talk with your health care provider about other stimulants to avoid. Stimulants are substances that can make you feel alert and attentive by increasing certain activities in the body (such as heart rate and blood pressure). These substances may make tinnitus worse.  Take over-the-counter and prescription medicines only as told by your health care provider.  Try to get plenty of sleep each night.  Keep all follow-up visits as told by your health care provider. This is important. Contact a health care provider if:  Your tinnitus continues for 3 weeks or longer without stopping.  Your symptoms get worse or do not get better with home care.  You develop tinnitus after a head injury.  You have tinnitus along with any of the following: ? Dizziness. ? Loss of balance. ? Nausea and vomiting. Summary  Tinnitus refers to hearing a sound when there is no actual source for that sound. This is often described as ringing in the ears.  Symptoms may affect only one ear (unilateral) or both ears (bilateral).  Use a white noise machine, a humidifier, or other devices to mask the sound of tinnitus.  Do not use stimulants, such as nicotine, alcohol, or caffeine. Talk with your health care provider about other stimulants to avoid. These substances may make tinnitus worse. This information is not intended to replace advice given to you by your health care provider. Make sure you discuss any questions you have with your health care provider. Document Released: 08/11/2005 Document Revised: 05/21/2017 Document Reviewed: 05/21/2017 Elsevier Interactive Patient Education  2019 [place] is a normal reaction to life events. Stress is what you feel when life demands more than you are used to, or more  than you think you can handle. Some stress can be useful, such as studying for a  test or meeting a deadline at work. Stress that occurs too often or for too long can cause problems. It can affect your emotional health and interfere with relationships and normal daily activities. Too much stress can weaken your body's defense system (immune system) and increase your risk for physical illness. If you already have a medical problem, stress can make it worse. What are the causes? All sorts of life events can cause stress. An event that causes stress for one person may not be stressful for another person. Major life events, whether positive or negative, commonly cause stress. Examples include:  Losing a job or starting a new job.  Losing a loved one.  Moving to a new town or home.  Getting married or divorced.  Having a baby.  Injury or illness. Less obvious life events can also cause stress, especially if they occur day after day or in combination with each other. Examples include:  Working long hours.  Driving in traffic.  Caring for children.  Being in debt.  Being in a difficult relationship. What are the signs or symptoms? Stress can cause emotional symptoms, including:  Anxiety. This is feeling worried, afraid, on edge, overwhelmed, or out of control.  Anger, including irritation or impatience.  Depression. This is feeling sad, down, helpless, or guilty.  Trouble focusing, remembering, or making decisions. Stress can cause physical symptoms, including:  Aches and pains. These may affect your head, neck, back, stomach, or other areas of your body.  Tight muscles or a clenched jaw.  Low energy.  Trouble sleeping. Stress can cause unhealthy behaviors, including:  Eating to feel better (overeating) or skipping meals.  Working too much or putting off tasks.  Smoking, drinking alcohol, or using drugs to feel better. How is this diagnosed? Stress is diagnosed through an  assessment by your health care provider. He or she may diagnose this condition based on:  Your symptoms and any stressful life events.  Your medical history.  Tests to rule out other causes of your symptoms. Depending on your condition, your health care provider may refer you to a specialist for further evaluation. How is this treated?  Stress management techniques are the recommended treatment for stress. Medicine is not typically recommended for the treatment of stress. Techniques to reduce your reaction to stressful life events include:  Stress identification. Monitor yourself for symptoms of stress and identify what causes stress for you. These skills may help you to avoid or prepare for stressful events.  Time management. Set your priorities, keep a calendar of events, and learn to say "no." Taking these actions can help you avoid making too many commitments. Techniques for coping with stress include:  Rethinking the problem. Try to think realistically about stressful events rather than ignoring them or overreacting. Try to find the positives in a stressful situation rather than focusing on the negatives.  Exercise. Physical exercise can release both physical and emotional tension. The key is to find a form of exercise that you enjoy and do it regularly.  Relaxation techniques. These relax the body and mind. The key is to find one or more that you enjoy and use the technique(s) regularly. Examples include: ? Meditation, deep breathing, or progressive relaxation techniques. ? Yoga or tai chi. ? Biofeedback, mindfulness techniques, or journaling. ? Listening to music, being out in nature, or participating in other hobbies.  Practicing a healthy lifestyle. Eat a balanced diet, drink plenty of water, limit or avoid caffeine, and get plenty of sleep.  Having a strong support network. Spend time with family, friends, or other people you enjoy being around. Express your feelings and talk  things over with someone you trust. Counseling or talk therapy with a mental health professional may be helpful if you are having trouble managing stress on your own. Follow these instructions at home: Lifestyle   Avoid drugs.  Do not use any products that contain nicotine or tobacco, such as cigarettes and e-cigarettes. If you need help quitting, ask your health care provider.  Limit alcohol intake to no more than 1 drink a day for nonpregnant women and 2 drinks a day for men. One drink equals 12 oz of beer, 5 oz of wine, or 1 oz of hard liquor.  Do not use alcohol or drugs to relax.  Eat a balanced diet that includes fresh fruits and vegetables, whole grains, lean meats, fish, eggs, and beans, and low-fat dairy. Avoid processed foods and foods high in added fat, sugar, and salt.  Exercise at least 30 minutes on 5 or more days each week.  Get 7-8 hours of sleep each night. General instructions   Practice stress management techniques as discussed with your health care provider.  Drink enough fluid to keep your urine clear or pale yellow.  Take over-the-counter and prescription medicines only as told by your health care provider.  Keep all follow-up visits as told by your health care provider. This is important. Contact a health care provider if:  Your symptoms get worse.  You have new symptoms.  You feel overwhelmed by your problems and can no longer manage them on your own. Get help right away if:  You have thoughts of hurting yourself or others. If you ever feel like you may hurt yourself or others, or have thoughts about taking your own life, get help right away. You can go to your nearest emergency department or call:  Your local emergency services (911 in the [place]  A suicide crisis helpline, such as the [facility] at 551-581-6831. This is open 24 hours a day. Summary  Stress is a normal reaction to life events. It can cause problems if  it happens too often or for too long.  Practicing stress management techniques is the best way to treat stress.  Counseling or talk therapy with a mental health professional may be helpful if you are having trouble managing stress on your own. This information is not intended to replace advice given to you by your health care provider. Make sure you discuss any questions you have with your health care provider. Document Released: 02/04/2001 Document Revised: 10/01/2016 Document Reviewed: 10/01/2016 Elsevier Interactive Patient Education  [employer].   If you have lab work done today you will be contacted with your lab results within the next 2 weeks.  If you have not heard from [place] then please contact us. The fastest way to get your results is to register for My Chart.   IF you received an x-ray today, you will receive an invoice from [facility]. Please contact [facility] at [phone] with questions or concerns regarding your invoice.   IF you received labwork today, you will receive an invoice from [facility]. Please contact [facility] at [phone] with questions or concerns regarding your invoice.   Our billing staff will not be able to assist you with questions regarding bills from these companies.  You will be contacted with the lab results as soon as they are available. The fastest way to  get your results is to activate your My Chart account. Instructions are located on the last page of this paperwork. If you have not heard from [place] regarding the results in 2 weeks, please contact this office.

## 2018-10-05 LAB — COMPREHENSIVE METABOLIC PANEL
ALT: 21 IU/L (ref 0–32)
AST: 22 IU/L (ref 0–40)
Albumin/Globulin Ratio: 2.1 (ref 1.2–2.2)
Albumin: 4.6 g/dL (ref 3.8–4.8)
Alkaline Phosphatase: 70 IU/L (ref 39–117)
BUN/Creatinine Ratio: 24 (ref 12–28)
BUN: 24 mg/dL (ref 8–27)
Bilirubin Total: 0.3 mg/dL (ref 0.0–1.2)
CO2: 20 mmol/L (ref 20–29)
Calcium: 9.5 mg/dL (ref 8.7–10.3)
Chloride: 105 mmol/L (ref 96–106)
Creatinine, Ser: 1 mg/dL (ref 0.57–1.00)
GFR calc Af Amer: 70 mL/min/{1.73_m2} (ref >59)
GFR calc non Af Amer: 61 mL/min/{1.73_m2} (ref >59)
Globulin, Total: 2.2 g/dL (ref 1.5–4.5)
Glucose: 90 mg/dL (ref 65–99)
Potassium: 4.5 mmol/L (ref 3.5–5.2)
Sodium: 140 mmol/L (ref 134–144)
Total Protein: 6.8 g/dL (ref 6.0–8.5)

## 2018-10-05 LAB — LIPID PANEL
Chol/HDL Ratio: 4 ratio (ref 0.0–4.4)
Cholesterol, Total: 180 mg/dL (ref 100–199)
HDL: 45 mg/dL (ref >39)
LDL Calculated: 93 mg/dL (ref 0–99)
Triglycerides: 209 mg/dL — ABNORMAL HIGH (ref 0–149)
VLDL Cholesterol Cal: 42 mg/dL — ABNORMAL HIGH (ref 5–40)

## 2018-10-05 MED ORDER — AMOXICILLIN-POT CLAVULANATE 875-125 MG PO TABS: 1.0000 | ORAL_TABLET | Freq: Two times a day (BID) | ORAL | 0 refills | Status: DC

## 2018-10-05 NOTE — Addendum Note (Signed)
Addended by: Merri Ray R on: 10/05/2018 10:57 AM   Modules accepted: Orders

## 2018-10-05 NOTE — Telephone Encounter (Signed)
Please advise on refill for antibiotic.   Pt and note stated that "Can try isoflavinoids for tinnitus, see other info below. I would recommend hearing screen at some point.  Can try antibiotic for possible sinus infection, use saline nasal spray 4-5 times per day if needed. "  Pt was just seen yesterday.  Called [facility] to inform her that we are working on it. Pt stated that she is to go out of town for work and would like to start the antibiotic before leaving town.  Thanks, [employer]

## 2018-10-05 NOTE — Telephone Encounter (Signed)
I have spoken to [place] and he has sent in the antibiotic to the pharmacy. I have called the pt and notified her of this. She stated understanding.   Thanks, [employer]

## 2018-10-12 ENCOUNTER — Encounter: Payer: Self-pay | Admitting: Radiology

## 2018-11-18 ENCOUNTER — Telehealth (INDEPENDENT_AMBULATORY_CARE_PROVIDER_SITE_OTHER): Payer: BLUE CROSS/BLUE SHIELD | Admitting: Family Medicine

## 2018-11-18 ENCOUNTER — Other Ambulatory Visit: Payer: Self-pay

## 2018-11-18 DIAGNOSIS — G4700 Insomnia, unspecified: Secondary | ICD-10-CM | POA: Diagnosis not present

## 2018-11-18 DIAGNOSIS — J309 Allergic rhinitis, unspecified: Secondary | ICD-10-CM | POA: Diagnosis not present

## 2018-11-18 DIAGNOSIS — H9312 Tinnitus, left ear: Secondary | ICD-10-CM

## 2018-11-18 DIAGNOSIS — H9190 Unspecified hearing loss, unspecified ear: Secondary | ICD-10-CM | POA: Diagnosis not present

## 2018-11-18 DIAGNOSIS — G43909 Migraine, unspecified, not intractable, without status migrainosus: Secondary | ICD-10-CM | POA: Diagnosis not present

## 2018-11-18 NOTE — Progress Notes (Signed)
Virtual Visit via Telephone Note  I connected with Jennifer [name] on 11/18/18 at 11:26 AM  by telephone and verified that I am speaking with the correct person using two identifiers.   I discussed the limitations, risks, security and privacy concerns of performing an evaluation and management service by telephone and the availability of in person appointments. I also discussed with the patient that there may be a patient responsible charge related to this service. The patient expressed understanding and agreed to proceed, consent.  CC: migraine HA, follow up from last ov.   History of Present Illness:  Migraine HA: Last seen February 10 with multiple other concerns.  Suspected component of sinusitis with rhinitis medicamentosa contribute to headaches.  Treated with Augmentin for acute sinusitis, advised to wean use of nasal decongestant, continue Flonase.  Initially with migraine, increased her Topamax to twice daily with Fioricet if needed. Ha's are better with drinking more water, and taking 2nd topiramate. 1st week did feel more drowsy, but better now.  Last HA Monday, typical of Migraine with barometric pressure changes. Took 2 fioricet at that time. This was only migraine since last visit. Slight HA at times- more sinus  Would like to see how things go for a few months before changes or specialist eval.   Nasal congestion cleared with Augmentin. Still using flonase. occasional sneeze. Did taper off decongestant nasal spray.   Insomnia: Improved at last visit with use of trazodone and 3 times daily dosing of BuSpar. Sleeping well. 11:30 - 6am. Feels that this is very good. Some worries about business during pandemic.   Tinnitus: See last ov. Longstanding symptoms. Did not try isoflavinoids or hearing test. No hearing change since last visit. Would like to try isoflavinoids first, but agrees to meet with ENT.   Overactive bladder: See last ov - still taking Ditropan. Plans to  discuss further at OV in 3 months, but will call for appt sooner if any change in effectiveness.    Patient Active Problem List   Diagnosis Date Noted  . Essential hypertension 06/13/2015  . Bilateral chronic knee pain 10/14/2013  . Chronic bilateral low back pain without sciatica 11/01/2012  . S/P laparoscopic appendectomy 07/28/2012  . Migraines 09/24/2011  . Hyperlipidemia 09/24/2011   Past Medical History:  Diagnosis Date  . Hyperlipidemia   . Hypertension   . Migraine    Past Surgical History:  Procedure Laterality Date  . APPENDECTOMY    . LAPAROSCOPIC APPENDECTOMY  07/15/2012   Procedure: APPENDECTOMY LAPAROSCOPIC;  Surgeon: Zenovia Jarred, MD;  Location: [facility];  Service: General;  Laterality: N/A;  Laparoscopic Appendectomy   No Known Allergies Prior to Admission medications   Medication Sig Start Date End Date Taking? Authorizing Provider  ACETAMINOPHEN-BUTALBITAL 50-325 MG TABS Take 1-2 tablets by mouth every 8 (eight) hours as needed. 10/04/18  Yes Wendie Agreste, MD  amLODipine (NORVASC) 5 MG tablet Take 1 tablet (5 mg total) by mouth daily. 10/04/18  Yes Wendie Agreste, MD  atorvastatin (LIPITOR) 10 MG tablet Take 1 tablet (10 mg total) by mouth daily. 10/04/18  Yes Wendie Agreste, MD  busPIRone (BUSPAR) 5 MG tablet Take 1 tablet (5 mg total) by mouth 3 (three) times daily. 07/02/18  Yes Wendie Agreste, MD  fluticasone (FLONASE) 50 MCG/ACT nasal spray Place 2 sprays into both nostrils daily. 11/23/17  Yes Wardell Honour, MD  losartan (COZAAR) 50 MG tablet Take 1 tablet (50 mg total) by mouth daily.  10/04/18  Yes Wendie Agreste, MD  meloxicam (MOBIC) 15 MG tablet TAKE 1/2-1 TABLET BY MOUTH DAILY 02/06/18  Yes Weber, Damaris Hippo, PA-C  Omega-3 Fatty Acids (OMEGA-3 FISH OIL PO) Take 1 capsule by mouth daily at 6 (six) AM.   Yes [provider]  oxybutynin (DITROPAN-XL) 5 MG 24 hr tablet Take 1 tablet (5 mg total) by mouth at bedtime. 10/04/18  Yes Wendie Agreste, MD  topiramate (TOPAMAX) 50 MG tablet Take 1 tablet (50 mg total) by mouth 2 (two) times daily. 10/04/18  Yes Wendie Agreste, MD  traMADol (ULTRAM) 50 MG tablet Take 1 tablet (50 mg total) by mouth every 8 (eight) hours as needed for moderate pain or severe pain. 05/19/18  Yes Jaynee Eagles, PA-C  traZODone (DESYREL) 50 MG tablet TAKE 0.5-1 TABLETS (25-50 MG TOTAL) BY MOUTH AT BEDTIME AS NEEDED FOR SLEEP. 07/27/18  Yes Wendie Agreste, MD   Social History   Socioeconomic History  . Marital status: Widowed    Spouse name: Not on file  . Number of children: 2  . Years of education: Not on file  . Highest education level: Not on file  Occupational History  . Occupation: retail    Comment: [employer] in [place], [place]  . Financial resource strain: Not on file  . Food insecurity:    Worry: Not on file    Inability: Not on file  . Transportation needs:    Medical: Not on file    Non-medical: Not on file  Tobacco Use  . Smoking status: Never Smoker  . Smokeless tobacco: Never Used  Substance and Sexual Activity  . Alcohol use: Yes    Comment: 2 drinks once every 2-3 months  . Drug use: No  . Sexual activity: Not on file  Lifestyle  . Physical activity:    Days per week: Not on file    Minutes per session: Not on file  . Stress: Not on file  Relationships  . Social connections:    Talks on phone: Not on file    Gets together: Not on file    Attends religious service: Not on file    Active member of club or organization: Not on file    Attends meetings of clubs or organizations: Not on file    Relationship status: Not on file  . Intimate partner violence:    Fear of current or ex partner: Not on file    Emotionally abused: Not on file    Physically abused: Not on file    Forced sexual activity: Not on file  Other Topics Concern  . Not on file  Social History Narrative   Marital status: widowed since 2008; husband was bipolar and alcoholic       Children: 2 children (28, 51); son lives with patient.Daughter lives in [place]/autism/does not drive.  Daughter lives with mother-in-law.  One grandpuppy      Lives: with son      Employment: works at [employer]; also works in [place].      Tobacco: none      Alcohol: none; husband was an alcoholic.      Drugs:   none      Exercise:     Observations/Objective: Speaking normally. No distress.   Assessment and Plan: Migraine without status migrainosus, not intractable, unspecified migraine type   -Improved with second topiramate per day, increase hydration and treating sinus symptoms as above.  No changes for  now  Insomnia, unspecified type  -Stable on current regimen.  No changes  Allergic rhinitis, unspecified seasonality, unspecified trigger  -Now tapered off nasal decongestant.  Stable with use of Flonase.  Decreased hearing, unspecified laterality Tinnitus of left ear  -Trial of isoflavonoids have been discussed, denies any worsening symptoms, but will refer to ENT for further evaluation  Follow Up Instructions:   3 months. sooner if needed.    I discussed the assessment and treatment plan with the patient. The patient was provided an opportunity to ask questions and all were answered. The patient agreed with the plan and demonstrated an understanding of the instructions.   The patient was advised to call back or seek an in-person evaluation if the symptoms worsen or if the condition fails to improve as anticipated.  I provided 19 minutes of non-face-to-face time during this encounter.  Signed,   Merri Ray, MD [facility].  11/18/18 11:26 AM

## 2018-11-18 NOTE — Patient Instructions (Addendum)
Glad to here that the congestion and headaches are improving.  No change in medications for now. You can still try isoflavonoids over-the-counter to see if that helps with the ringing in the ears but I did refer you to ear nose and throat specialist as well. Please follow-up in 3 months to review medicines again, but if there are any acute changes or worsening symptoms prior to that time, please call us and we can schedule an appointment sooner if needed.  Return to the clinic or go to the nearest emergency room if any of your symptoms worsen or new symptoms occur.

## 2018-11-18 NOTE — Progress Notes (Signed)
Migraine Headache flair up on Monday. 6 week  F/u visit. 132/87 at 730 am at meter

## 2018-12-23 ENCOUNTER — Other Ambulatory Visit: Payer: Self-pay | Admitting: Family Medicine

## 2018-12-23 DIAGNOSIS — F418 Other specified anxiety disorders: Secondary | ICD-10-CM

## 2019-01-07 ENCOUNTER — Other Ambulatory Visit: Payer: Self-pay | Admitting: Family Medicine

## 2019-01-15 ENCOUNTER — Other Ambulatory Visit: Payer: Self-pay | Admitting: Family Medicine

## 2019-01-15 DIAGNOSIS — F418 Other specified anxiety disorders: Secondary | ICD-10-CM

## 2019-01-15 NOTE — Telephone Encounter (Signed)
Pharmacy is requesting changes to Rx- sent for provider review

## 2019-01-31 ENCOUNTER — Telehealth: Payer: Self-pay | Admitting: Family Medicine

## 2019-01-31 NOTE — Telephone Encounter (Signed)
Left a msg on patient machine to give our office a call to schedule an appt with Dr Carlota Raspberry to get a refill on mobic medication

## 2019-01-31 NOTE — Telephone Encounter (Signed)
Copied from [place] [id number]. Topic: Quick Communication - Rx Refill/Question >> Jan 31, 2019  1:52 PM Sheran Luz wrote: Medication: meloxicam (MOBIC) 15 MG tablet  Patient is requesting refill of this medication.    Preferred Pharmacy (with phone number or street name):[id number] - [place], [place] [phone] (Phone) [phone] (Fax)

## 2019-02-07 ENCOUNTER — Other Ambulatory Visit: Payer: Self-pay

## 2019-02-07 ENCOUNTER — Telehealth (INDEPENDENT_AMBULATORY_CARE_PROVIDER_SITE_OTHER): Payer: BC Managed Care – PPO | Admitting: Family Medicine

## 2019-02-07 DIAGNOSIS — M549 Dorsalgia, unspecified: Secondary | ICD-10-CM

## 2019-02-07 DIAGNOSIS — M25562 Pain in left knee: Secondary | ICD-10-CM

## 2019-02-07 DIAGNOSIS — G8929 Other chronic pain: Secondary | ICD-10-CM | POA: Diagnosis not present

## 2019-02-07 DIAGNOSIS — M25561 Pain in right knee: Secondary | ICD-10-CM

## 2019-02-07 MED ORDER — MELOXICAM 7.5 MG PO TABS: 7.5000 mg | ORAL_TABLET | Freq: Every day | ORAL | 2 refills | Status: DC

## 2019-02-07 NOTE — Patient Instructions (Addendum)
For back pain - see info below. If persistent, we can try physical therapy - can discuss at follow up in 1 month. Try tylenol up to 4 times per day if needed, then low dose meloxicam if needed (7.5-15mg ).  For knee pain - see info below, but may need to do exam at next visit. Based on your description, it is likely patellofemoral pain. meloxicam if needed, but may be option of physical therapy as well.   Isoflavinoids were recommended for tinnitus. Can discuss this further if needed at next visit.   Patellofemoral Pain Syndrome  Patellofemoral pain syndrome is a condition in which the tissue (cartilage) on the underside of the kneecap (patella) softens or breaks down. This causes pain in the front of the knee. The condition is also called runner's knee or chondromalacia patella. Patellofemoral pain syndrome is most common in young adults who are active in sports. The knee is the largest joint in the body. The patella covers the front of the knee and is attached to muscles above and below the knee. The underside of the patella is covered with a smooth type of cartilage (synovium). The smooth surface helps the patella to glide easily when you move your knee. Patellofemoral pain syndrome causes swelling in the joint linings and bone surfaces in the knee. What are the causes? This condition may be caused by:  Overuse of the knee.  Poor alignment of your knee joints.  Weak leg muscles.  A direct blow to your kneecap. What increases the risk? You are more likely to develop this condition if:  You do a lot of activities that can wear down your kneecap. These include: ? Running. ? Squatting. ? Climbing stairs.  You start a new physical activity or exercise program.  You wear shoes that do not fit well.  You do not have good leg strength.  You are overweight. What are the signs or symptoms? The main symptom of this condition is knee pain. This may feel like a dull, aching pain underneath  your patella, in the front of your knee. There may be a popping or cracking sound when you move your knee. Pain may get worse with:  Exercise.  Climbing stairs.  Running.  Jumping.  Squatting.  Kneeling.  Sitting for a long time.  Moving or pushing on your patella. How is this diagnosed? This condition may be diagnosed based on:  Your symptoms and medical history. You may be asked about your recent physical activities and which ones cause knee pain.  A physical exam. This may include: ? Moving your patella back and forth. ? Checking your range of knee motion. ? Having you squat or jump to see if you have pain. ? Checking the strength of your leg muscles.  Imaging tests to confirm the diagnosis. These may include an MRI of your knee. How is this treated? This condition may be treated at home with rest, ice, compression, and elevation (RICE).  Other treatments may include:  Nonsteroidal anti-inflammatory drugs (NSAIDs).  Physical therapy to stretch and strengthen your leg muscles.  Shoe inserts (orthotics) to take stress off your knee.  A knee brace or knee support.  Adhesive tapes to the skin.  Surgery to remove damaged cartilage or move the patella to a better position. This is rare. Follow these instructions at home: If you have a shoe or brace:  Wear the shoe or brace as told by your health care provider. Remove it only as told by your health  care provider.  Loosen the shoe or brace if your toes tingle, become numb, or turn cold and blue.  Keep the shoe or brace clean.  If the shoe or brace is not waterproof: ? Do not let it get wet. ? Cover it with a watertight covering when you take a bath or a shower. Managing pain, stiffness, and swelling  If directed, put ice on the painful area. ? If you have a removable shoe or brace, remove it as told by your health care provider. ? Put ice in a plastic bag. ? Place a towel between your skin and the bag. ? Leave  the ice on for 20 minutes, 2-3 times a day.  Move your toes often to avoid stiffness and to lessen swelling.  Rest your knee: ? Avoid activities that cause knee pain. ? When sitting or lying down, raise (elevate) the injured area above the level of your heart, whenever possible. General instructions  Take over-the-counter and prescription medicines only as told by your health care provider.  Use splints, braces, knee supports, or walking aids as directed by your health care provider.  Perform stretching and strengthening exercises as told by your health care provider or physical therapist.  Do not use any products that contain nicotine or tobacco, such as cigarettes and e-cigarettes. These can delay healing. If you need help quitting, ask your health care provider.  Return to your normal activities as told by your health care provider. Ask your health care provider what activities are safe for you.  Keep all follow-up visits as told by your health care provider. This is important. Contact a health care provider if:  Your symptoms get worse.  You are not improving with home care. Summary  Patellofemoral pain syndrome is a condition in which the tissue (cartilage) on the underside of the kneecap (patella) softens or breaks down.  This condition causes swelling in the joint linings and bone surfaces in the knee. This leads to pain in the front of the knee.  This condition may be treated at home with rest, ice, compression, and elevation (RICE).  Use splints, braces, knee supports, or walking aids as directed by your health care provider. This information is not intended to replace advice given to you by your health care provider. Make sure you discuss any questions you have with your health care provider. Document Released: 07/30/2009 Document Revised: 09/21/2017 Document Reviewed: 09/21/2017 Elsevier Interactive Patient Education  2019 Elsevier Inc.    Chronic Back Pain When  back pain lasts longer than 3 months, it is called chronic back pain.The cause of your back pain may not be known. Some common causes include:  Wear and tear (degenerative disease) of the bones, ligaments, or disks in your back.  Inflammation and stiffness in your back (arthritis). People who have chronic back pain often go through certain periods in which the pain is more intense (flare-ups). Many people can learn to manage the pain with home care. Follow these instructions at home: Pay attention to any changes in your symptoms. Take these actions to help with your pain: Activity   Avoid bending and other activities that make the problem worse.  Maintain a proper position when standing or sitting: ? When standing, keep your upper back and neck straight, with your shoulders pulled back. Avoid slouching. ? When sitting, keep your back straight and relax your shoulders. Do not round your shoulders or pull them backward.  Do not sit or stand in one place for  long periods of time.  Take brief periods of rest throughout the day. This will reduce your pain. Resting in a lying or standing position is usually better than sitting to rest.  When you are resting for longer periods, mix in some mild activity or stretching between periods of rest. This will help to prevent stiffness and pain.  Get regular exercise. Ask your health care provider what activities are safe for you.  Do not lift anything that is heavier than 10 lb (4.5 kg). Always use proper lifting technique, which includes: ? Bending your knees. ? Keeping the load close to your body. ? Avoiding twisting.  Sleep on a firm mattress in a comfortable position. Try lying on your side with your knees slightly bent. If you lie on your back, put a pillow under your knees. Managing pain  If directed, apply ice to the painful area. Your health care provider may recommend applying ice during the first 24-48 hours after a flare-up  begins. ? Put ice in a plastic bag. ? Place a towel between your skin and the bag. ? Leave the ice on for 20 minutes, 2-3 times per day.  If directed, apply heat to the affected area as often as told by your health care provider. Use the heat source that your health care provider recommends, such as a moist heat pack or a heating pad. ? Place a towel between your skin and the heat source. ? Leave the heat on for 20-30 minutes. ? Remove the heat if your skin turns bright red. This is especially important if you are unable to feel pain, heat, or cold. You may have a greater risk of getting burned.  Try soaking in a warm tub.  Take over-the-counter and prescription medicines only as told by your health care provider.  Keep all follow-up visits as told by your health care provider. This is important. Contact a health care provider if:  You have pain that is not relieved with rest or medicine. Get help right away if:  You have weakness or numbness in one or both of your legs or feet.  You have trouble controlling your bladder or your bowels.  You have nausea or vomiting.  You have pain in your abdomen.  You have shortness of breath or you faint. This information is not intended to replace advice given to you by your health care provider. Make sure you discuss any questions you have with your health care provider. Document Released: 09/18/2004 Document Revised: 03/18/2018 Document Reviewed: 02/18/2017 Elsevier Interactive Patient Education  [employer].    If you have lab work done today you will be contacted with your lab results within the next 2 weeks.  If you have not heard from [place] then please contact us. The fastest way to get your results is to register for My Chart.   IF you received an x-ray today, you will receive an invoice from [facility]. Please contact [facility] at [phone] with questions or concerns regarding your invoice.   IF you  received labwork today, you will receive an invoice from [facility]. Please contact [facility] at [phone] with questions or concerns regarding your invoice.   Our billing staff will not be able to assist you with questions regarding bills from these companies.  You will be contacted with the lab results as soon as they are available. The fastest way to get your results is to activate your My Chart account. Instructions are located on the last page  of this paperwork. If you have not heard from [place] regarding the results in 2 weeks, please contact this office.

## 2019-02-07 NOTE — Progress Notes (Signed)
CC- med Refill- Patient is doing well and no issue at this time. 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 She stated she has been out of this med's for a few days and she is feeling the difference when off this medication.
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
## 2019-03-10 ENCOUNTER — Ambulatory Visit (INDEPENDENT_AMBULATORY_CARE_PROVIDER_SITE_OTHER): Payer: BC Managed Care – PPO | Admitting: Family Medicine

## 2019-03-10 ENCOUNTER — Ambulatory Visit (INDEPENDENT_AMBULATORY_CARE_PROVIDER_SITE_OTHER): Payer: BC Managed Care – PPO

## 2019-03-10 ENCOUNTER — Encounter: Payer: Self-pay | Admitting: Family Medicine

## 2019-03-10 ENCOUNTER — Other Ambulatory Visit: Payer: Self-pay

## 2019-03-10 VITALS — BP 127/72 | HR 101 | Temp 98.6°F | Resp 14 | Wt 177.6 lb

## 2019-03-10 DIAGNOSIS — M25561 Pain in right knee: Secondary | ICD-10-CM

## 2019-03-10 DIAGNOSIS — M545 Low back pain, unspecified: Secondary | ICD-10-CM

## 2019-03-10 DIAGNOSIS — G8929 Other chronic pain: Secondary | ICD-10-CM

## 2019-03-10 DIAGNOSIS — M222X2 Patellofemoral disorders, left knee: Secondary | ICD-10-CM

## 2019-03-10 DIAGNOSIS — M25562 Pain in left knee: Secondary | ICD-10-CM

## 2019-03-10 DIAGNOSIS — M222X1 Patellofemoral disorders, right knee: Secondary | ICD-10-CM

## 2019-03-10 NOTE — Progress Notes (Signed)
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 visits as told by your health care provider. This is important. Contact a health care provider if:  You have pain that is not relieved with rest or medicine. Get help right away if:  You have weakness or numbness in one or both of your legs or feet.  You have trouble controlling your bladder or your bowels.  You have nausea or vomiting.  You have pain in your abdomen.  You have shortness of breath or you faint. This information is not intended to replace advice given to you by your health care provider. Make sure you discuss any questions you have with your health care provider. Document Released: 09/18/2004 Document Revised: 12/02/2018 Document Reviewed: 02/18/2017 Elsevier Patient Education  [employer].   If you have lab work done today you will be  contacted with your lab results within the next 2 weeks.  If you have not heard from [place] then please contact us. The fastest way to get your results is to register for My Chart.   IF you received an x-ray today, you will receive an invoice from [facility]. Please contact [facility] at [phone] with questions or concerns regarding your invoice.   IF you received labwork today, you will receive an invoice from [facility]. Please contact [facility] at [phone] with questions or concerns regarding your invoice.   Our billing staff will not be able to assist you with questions regarding bills from these companies.  You will be contacted with the lab results as soon as they are available. The fastest way to get your results is to activate your My Chart account. Instructions are located on the last page of this paperwork. If you have not heard from [place] regarding the results in 2 weeks, please contact this office.       Signed,   Merri Ray, MD [facility].  03/10/19 9:35 PM

## 2019-03-10 NOTE — Patient Instructions (Addendum)
I think physical therapy will be helpful for both back and knee pain.  I will check some x-rays of back and knees but expect those to be stable.    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  Try Tylenol in place of meloxicam to see if that is just as effective, but if you do need to take meloxicam, that can be taken once per day for now.  Follow-up in the next 4 to 6 weeks, and we can discuss if orthopedic evaluation is needed.  Follow-up sooner if any symptoms are worse.  Thank you for coming in today and stay safe.   Chronic Knee Pain, Adult Chronic knee pain is pain in one or both knees that lasts longer than 3 months. Symptoms of chronic knee pain may include swelling, stiffness, and discomfort. Age-related wear and tear (osteoarthritis) of the knee joint is the most common cause of chronic knee pain. Other possible causes include:  A long-term immune-related disease that causes inflammation of the knee (rheumatoid arthritis). This usually affects both knees.  Inflammatory arthritis, such as gout or pseudogout.  An injury to the knee that causes arthritis.  An injury to the knee that damages the ligaments. Ligaments are strong tissues that connect bones to each other.  Runner's knee or pain behind the kneecap. Treatment for chronic knee pain depends on the cause. The main treatments for chronic knee pain are physical therapy and weight loss. This condition may also be treated with medicines, injections, a knee sleeve or brace, and by using crutches. Rest, ice, compression (pressure), and elevation (RICE) therapy may also be recommended. Follow these instructions at home: If you have a knee sleeve or brace:   Wear it as told by your health care provider. Remove it only as told by your health care provider.  Loosen it if your toes tingle, become numb, or turn cold and blue.  Keep it clean.  If the sleeve or brace is not waterproof: ? Do not  let it get wet. ? Remove it if allowed by your health care provider, or cover it with a watertight covering when you take a bath or a shower. Managing pain, stiffness, and swelling      If directed, apply heat to the affected area as often as told by your health care provider. Use the heat source that your health care provider recommends, such as a moist heat pack or a heating pad. ? If you have a removable sleeve or brace, remove it as told by your health care provider. ? Place a towel between your skin and the heat source. ? Leave the heat on for 20-30 minutes. ? Remove the heat if your skin turns bright red. This is especially important if you are unable to feel pain, heat, or cold. You may have a greater risk of getting burned.  If directed, put ice on the affected area. ? If you have a removable sleeve or brace, remove it as told by your health care provider. ? Put ice in a plastic bag. ? Place a towel between your skin and the bag. ? Leave the ice on for 20 minutes, 2-3 times a day.  Move your toes often to reduce stiffness and swelling.  Raise (elevate) the injured area above the level of your heart while you are sitting or lying down. Activity  Avoid activities where both feet leave the ground at the same time (high-impact activities). Examples are running, jumping rope,  and doing jumping jacks.  Return to your normal activities as told by your health care provider. Ask your health care provider what activities are safe for you.  Follow the exercise plan that your health care provider designed for you. Your health care provider may suggest that you: ? Avoid activities that make knee pain worse. This may require you to change your exercise routines, sport participation, or job duties. ? Wear shoes with cushioned soles. ? Avoid high-impact activities or sports that require running and sudden changes in direction. ? Do physical therapy as told by your health care provider.  Physical therapy is planned to match your needs and abilities. It may include exercises for strength, flexibility, stability, and endurance. ? Do exercises that increase balance and strength, such as tai chi and yoga.  Do not use the injured limb to support your body weight until your health care provider says that you can. Use crutches, a cane, or a walker, as told by your health care provider. General instructions  Take over-the-counter and prescription medicines only as told by your health care provider.  Lose weight if you are overweight. Losing even a little weight can reduce knee pain. Ask your health care provider what your ideal weight is, and how to safely lose extra weight. A food expert (dietitian) may be able to help you plan your meals.  Do not use any products that contain nicotine or tobacco, such as cigarettes, e-cigarettes, and chewing tobacco. These can delay healing. If you need help quitting, ask your health care provider.  Keep all follow-up visits as told by your health care provider. This is important. Contact a health care provider if:  You have knee pain that is not getting better or gets worse.  You are unable to do your physical therapy exercises due to knee pain. Get help right away if:  Your knee swells and the swelling becomes worse.  You cannot move your knee.  You have severe knee pain. Summary  Knee pain that lasts more than 3 months is considered chronic knee pain.  The main treatments for chronic knee pain are physical therapy and weight loss. You may also need to take medicines, wear a knee sleeve or brace, use crutches, and apply ice or heat.  Losing even a little weight can reduce knee pain. Ask your health care provider what your ideal weight is, and how to safely lose extra weight. A food expert (dietitian) may be able to help you plan your meals.  Work with a physical therapist to make a safe exercise program, as told by your health care  provider. This information is not intended to replace advice given to you by your health care provider. Make sure you discuss any questions you have with your health care provider. Document Released: 10/21/2018 Document Revised: 10/21/2018 Document Reviewed: 10/21/2018 Elsevier Patient Education  2020 [place].  Patellofemoral Pain Syndrome  Patellofemoral pain syndrome is a condition in which the tissue (cartilage) on the underside of the kneecap (patella) softens or breaks down. This causes pain in the front of the knee. The condition is also called runner's knee or chondromalacia patella. Patellofemoral pain syndrome is most common in young adults who are active in sports. The knee is the largest joint in the body. The patella covers the front of the knee and is attached to muscles above and below the knee. The underside of the patella is covered with a smooth type of cartilage (synovium). The smooth surface helps the  patella to glide easily when you move your knee. Patellofemoral pain syndrome causes swelling in the joint linings and bone surfaces in the knee. What are the causes? This condition may be caused by:  Overuse of the knee.  Poor alignment of your knee joints.  Weak leg muscles.  A direct blow to your kneecap. What increases the risk? You are more likely to develop this condition if:  You do a lot of activities that can wear down your kneecap. These include: ? Running. ? Squatting. ? Climbing stairs.  You start a new physical activity or exercise program.  You wear shoes that do not fit well.  You do not have good leg strength.  You are overweight. What are the signs or symptoms? The main symptom of this condition is knee pain. This may feel like a dull, aching pain underneath your patella, in the front of your knee. There may be a popping or cracking sound when you move your knee. Pain may get worse with:  Exercise.  Climbing  stairs.  Running.  Jumping.  Squatting.  Kneeling.  Sitting for a long time.  Moving or pushing on your patella. How is this diagnosed? This condition may be diagnosed based on:  Your symptoms and medical history. You may be asked about your recent physical activities and which ones cause knee pain.  A physical exam. This may include: ? Moving your patella back and forth. ? Checking your range of knee motion. ? Having you squat or jump to see if you have pain. ? Checking the strength of your leg muscles.  Imaging tests to confirm the diagnosis. These may include an MRI of your knee. How is this treated? This condition may be treated at home with rest, ice, compression, and elevation (RICE).  Other treatments may include:  Nonsteroidal anti-inflammatory drugs (NSAIDs).  Physical therapy to stretch and strengthen your leg muscles.  Shoe inserts (orthotics) to take stress off your knee.  A knee brace or knee support.  Adhesive tapes to the skin.  Surgery to remove damaged cartilage or move the patella to a better position. This is rare. Follow these instructions at home: If you have a shoe or brace:  Wear the shoe or brace as told by your health care provider. Remove it only as told by your health care provider.  Loosen the shoe or brace if your toes tingle, become numb, or turn cold and blue.  Keep the shoe or brace clean.  If the shoe or brace is not waterproof: ? Do not let it get wet. ? Cover it with a watertight covering when you take a bath or a shower. Managing pain, stiffness, and swelling  If directed, put ice on the painful area. ? If you have a removable shoe or brace, remove it as told by your health care provider. ? Put ice in a plastic bag. ? Place a towel between your skin and the bag. ? Leave the ice on for 20 minutes, 2-3 times a day.  Move your toes often to avoid stiffness and to lessen swelling.  Rest your knee: ? Avoid activities that  cause knee pain. ? When sitting or lying down, raise (elevate) the injured area above the level of your heart, whenever possible. General instructions  Take over-the-counter and prescription medicines only as told by your health care provider.  Use splints, braces, knee supports, or walking aids as directed by your health care provider.  Perform stretching and strengthening exercises as told  by your health care provider or physical therapist.  Do not use any products that contain nicotine or tobacco, such as cigarettes and e-cigarettes. These can delay healing. If you need help quitting, ask your health care provider.  Return to your normal activities as told by your health care provider. Ask your health care provider what activities are safe for you.  Keep all follow-up visits as told by your health care provider. This is important. Contact a health care provider if:  Your symptoms get worse.  You are not improving with home care. Summary  Patellofemoral pain syndrome is a condition in which the tissue (cartilage) on the underside of the kneecap (patella) softens or breaks down.  This condition causes swelling in the joint linings and bone surfaces in the knee. This leads to pain in the front of the knee.  This condition may be treated at home with rest, ice, compression, and elevation (RICE).  Use splints, braces, knee supports, or walking aids as directed by your health care provider. This information is not intended to replace advice given to you by your health care provider. Make sure you discuss any questions you have with your health care provider. Document Released: 07/30/2009 Document Revised: 09/21/2017 Document Reviewed: 09/21/2017 Elsevier Patient Education  [employer].  Chronic Back Pain When back pain lasts longer than 3 months, it is called chronic back pain.The cause of your back pain may not be known. Some common causes include:  Wear and tear  (degenerative disease) of the bones, ligaments, or disks in your back.  Inflammation and stiffness in your back (arthritis). People who have chronic back pain often go through certain periods in which the pain is more intense (flare-ups). Many people can learn to manage the pain with home care. Follow these instructions at home: Pay attention to any changes in your symptoms. Take these actions to help with your pain: Activity   Avoid bending and other activities that make the problem worse.  Maintain a proper position when standing or sitting: ? When standing, keep your upper back and neck straight, with your shoulders pulled back. Avoid slouching. ? When sitting, keep your back straight and relax your shoulders. Do not round your shoulders or pull them backward.  Do not sit or stand in one place for long periods of time.  Take brief periods of rest throughout the day. This will reduce your pain. Resting in a lying or standing position is usually better than sitting to rest.  When you are resting for longer periods, mix in some mild activity or stretching between periods of rest. This will help to prevent stiffness and pain.  Get regular exercise. Ask your health care provider what activities are safe for you.  Do not lift anything that is heavier than 10 lb (4.5 kg). Always use proper lifting technique, which includes: ? Bending your knees. ? Keeping the load close to your body. ? Avoiding twisting.  Sleep on a firm mattress in a comfortable position. Try lying on your side with your knees slightly bent. If you lie on your back, put a pillow under your knees. Managing pain  If directed, apply ice to the painful area. Your health care provider may recommend applying ice during the first 24-48 hours after a flare-up begins. ? Put ice in a plastic bag. ? Place a towel between your skin and the bag. ? Leave the ice on for 20 minutes, 2-3 times per day.  If directed, apply heat to  the  affected area as often as told by your health care provider. Use the heat source that your health care provider recommends, such as a moist heat pack or a heating pad. ? Place a towel between your skin and the heat source. ? Leave the heat on for 20-30 minutes. ? Remove the heat if your skin turns bright red. This is especially important if you are unable to feel pain, heat, or cold. You may have a greater risk of getting burned.  Try soaking in a warm tub.  Take over-the-counter and prescription medicines only as told by your health care provider.  Keep all follow-up visits as told by your health care provider. This is important. Contact a health care provider if:  You have pain that is not relieved with rest or medicine. Get help right away if:  You have weakness or numbness in one or both of your legs or feet.  You have trouble controlling your bladder or your bowels.  You have nausea or vomiting.  You have pain in your abdomen.  You have shortness of breath or you faint. This information is not intended to replace advice given to you by your health care provider. Make sure you discuss any questions you have with your health care provider. Document Released: 09/18/2004 Document Revised: 12/02/2018 Document Reviewed: 02/18/2017 Elsevier Patient Education  [employer].   If you have lab work done today you will be contacted with your lab results within the next 2 weeks.  If you have not heard from [place] then please contact us. The fastest way to get your results is to register for My Chart.   IF you received an x-ray today, you will receive an invoice from [facility]. Please contact [facility] at [phone] with questions or concerns regarding your invoice.   IF you received labwork today, you will receive an invoice from [place]. Please contact [facility] at [phone] with questions or concerns regarding your invoice.   Our billing staff will not be  able to assist you with questions regarding bills from these companies.  You will be contacted with the lab results as soon as they are available. The fastest way to get your results is to activate your My Chart account. Instructions are located on the last page of this paperwork. If you have not heard from [place] regarding the results in 2 weeks, please contact this office.

## 2019-03-27 ENCOUNTER — Other Ambulatory Visit: Payer: Self-pay | Admitting: Family Medicine

## 2019-03-27 DIAGNOSIS — N3281 Overactive bladder: Secondary | ICD-10-CM

## 2019-03-27 NOTE — Telephone Encounter (Signed)
Requested Prescriptions  Pending Prescriptions Disp Refills  . oxybutynin (DITROPAN-XL) 5 MG 24 hr tablet [Pharmacy Med Name: OXYBUTYNIN CL ER 5 MG TABLET] 90 tablet 1    Sig: TAKE 1 TABLET BY MOUTH EVERYDAY AT BEDTIME     Urology:  Bladder Agents Passed - 03/27/2019  9:50 AM      Passed - Valid encounter within last 12 months    Recent Outpatient Visits          2 weeks ago Chronic low back pain without sciatica, unspecified back pain laterality   [facility] [place], MD   5 months ago Acute sinusitis, recurrence not specified, unspecified location   [facility], Ranell Patrick, MD   8 months ago Situational anxiety   [facility], Ranell Patrick, MD   10 months ago Essential hypertension   [facility] [place], [place]   1 year ago Essential hypertension   [facility], Renette Butters, MD

## 2019-04-01 ENCOUNTER — Other Ambulatory Visit: Payer: Self-pay | Admitting: Family Medicine

## 2019-04-18 ENCOUNTER — Other Ambulatory Visit: Payer: Self-pay | Admitting: Family Medicine

## 2019-04-18 DIAGNOSIS — G4700 Insomnia, unspecified: Secondary | ICD-10-CM

## 2019-04-28 ENCOUNTER — Other Ambulatory Visit: Payer: Self-pay | Admitting: Family Medicine

## 2019-04-28 NOTE — Telephone Encounter (Signed)
Forwarding medication refill request to the clinical pool for review. 

## 2019-05-26 ENCOUNTER — Other Ambulatory Visit: Payer: Self-pay | Admitting: Family Medicine

## 2019-06-05 ENCOUNTER — Other Ambulatory Visit: Payer: Self-pay | Admitting: Family Medicine

## 2019-06-05 DIAGNOSIS — M549 Dorsalgia, unspecified: Secondary | ICD-10-CM

## 2019-06-05 DIAGNOSIS — G8929 Other chronic pain: Secondary | ICD-10-CM

## 2019-06-05 DIAGNOSIS — M25562 Pain in left knee: Secondary | ICD-10-CM

## 2019-06-05 DIAGNOSIS — G43709 Chronic migraine without aura, not intractable, without status migrainosus: Secondary | ICD-10-CM

## 2019-06-05 DIAGNOSIS — M25561 Pain in right knee: Secondary | ICD-10-CM

## 2019-06-23 ENCOUNTER — Other Ambulatory Visit: Payer: Self-pay | Admitting: Family Medicine

## 2019-06-23 NOTE — Telephone Encounter (Signed)
Forwarding medication refill request to the clinical pool for review. 

## 2019-08-15 ENCOUNTER — Other Ambulatory Visit: Payer: Self-pay | Admitting: Family Medicine

## 2019-08-15 DIAGNOSIS — M25561 Pain in right knee: Secondary | ICD-10-CM

## 2019-08-15 DIAGNOSIS — G8929 Other chronic pain: Secondary | ICD-10-CM

## 2019-08-15 DIAGNOSIS — M25562 Pain in left knee: Secondary | ICD-10-CM

## 2019-08-16 ENCOUNTER — Other Ambulatory Visit: Payer: Self-pay | Admitting: Family Medicine

## 2019-08-16 DIAGNOSIS — F418 Other specified anxiety disorders: Secondary | ICD-10-CM

## 2019-09-21 ENCOUNTER — Other Ambulatory Visit: Payer: Self-pay | Admitting: Family Medicine

## 2019-10-01 ENCOUNTER — Other Ambulatory Visit: Payer: Self-pay | Admitting: Family Medicine

## 2019-10-01 DIAGNOSIS — N3281 Overactive bladder: Secondary | ICD-10-CM

## 2019-10-01 NOTE — Telephone Encounter (Signed)
Requested Prescriptions  Pending Prescriptions Disp Refills  . oxybutynin (DITROPAN-XL) 5 MG 24 hr tablet [Pharmacy Med Name: OXYBUTYNIN CL ER 5 MG TABLET] 90 tablet 1    Sig: TAKE 1 TABLET BY MOUTH EVERYDAY AT BEDTIME     Urology:  Bladder Agents Passed - 10/01/2019 10:30 AM      Passed - Valid encounter within last 12 months    Recent Outpatient Visits          6 months ago Chronic low back pain without sciatica, unspecified back pain laterality   [facility], Ranell Patrick, MD   7 months ago Chronic back pain, unspecified back location, unspecified back pain laterality   [facility], Ranell Patrick, MD   10 months ago Migraine without status migrainosus, not intractable, unspecified migraine type   [facility], Ranell Patrick, MD   12 months ago Acute sinusitis, recurrence not specified, unspecified location   [facility], Ranell Patrick, MD   1 year ago Situational anxiety   [facility], Ranell Patrick, MD

## 2019-10-19 ENCOUNTER — Other Ambulatory Visit: Payer: Self-pay | Admitting: Family Medicine

## 2019-10-19 NOTE — Telephone Encounter (Signed)
Requested medication (s) are due for refill today: yes  Requested medication (s) are on the active medication list: yes  Last refill:  07/25/2019  Future visit scheduled: no  Notes to clinic:  no valid encounter within last 6 months    Requested Prescriptions  Pending Prescriptions Disp Refills   losartan (COZAAR) 50 MG tablet [Pharmacy Med Name: LOSARTAN POTASSIUM 50 MG TAB] 90 tablet 1    Sig: TAKE 1 TABLET BY MOUTH EVERY DAY      Cardiovascular:  Angiotensin Receptor Blockers Failed - 10/19/2019  1:30 AM      Failed - Cr in normal range and within 180 days    Creat  Date Value Ref Range Status  01/29/2016 1.06 (H) 0.50 - 1.05 mg/dL Final   Creatinine, Ser  Date Value Ref Range Status  10/04/2018 1.00 0.57 - 1.00 mg/dL Final          Failed - K in normal range and within 180 days    Potassium  Date Value Ref Range Status  10/04/2018 4.5 3.5 - 5.2 mmol/L Final          Failed - Valid encounter within last 6 months    Recent Outpatient Visits           7 months ago Chronic low back pain without sciatica, unspecified back pain laterality   [facility] [place], MD   8 months ago Chronic back pain, unspecified back location, unspecified back pain laterality   [facility], Ranell Patrick, MD   11 months ago Migraine without status migrainosus, not intractable, unspecified migraine type   [facility], Ranell Patrick, MD   1 year ago Acute sinusitis, recurrence not specified, unspecified location   [facility], Ranell Patrick, MD   1 year ago Situational anxiety   [facility], Ranell Patrick, MD              Passed - Patient is not pregnant      Passed - Last BP in normal range    BP Readings from Last 1 Encounters:  03/10/19 127/72

## 2019-10-20 NOTE — Telephone Encounter (Signed)
Called patient to schedule, they did not answer and I lvm to call back and schedule

## 2019-11-10 ENCOUNTER — Other Ambulatory Visit: Payer: Self-pay | Admitting: Family Medicine

## 2019-11-10 NOTE — Telephone Encounter (Signed)
Requested  medications are  due for refill today yes  Requested medications are on the active medication list yes  Last refill 08/15/19  Future visit scheduled no  Notes to clinic failed protocol for visit and lab

## 2019-11-26 ENCOUNTER — Other Ambulatory Visit: Payer: Self-pay | Admitting: Family Medicine

## 2019-11-26 DIAGNOSIS — G4700 Insomnia, unspecified: Secondary | ICD-10-CM

## 2019-11-26 NOTE — Telephone Encounter (Signed)
30 day courtesy refill given  Requested Prescriptions  Pending Prescriptions Disp Refills  . traZODone (DESYREL) 50 MG tablet [Pharmacy Med Name: TRAZODONE 50 MG TABLET] 30 tablet 0    Sig: TAKE 0.5-1 TABLETS (25-50 MG TOTAL) BY MOUTH AT BEDTIME AS NEEDED FOR SLEEP.     Psychiatry: Antidepressants - Serotonin Modulator Failed - 11/26/2019  8:32 AM      Failed - Valid encounter within last 6 months    Recent Outpatient Visits          8 months ago Chronic low back pain without sciatica, unspecified back pain laterality   [facility], Ranell Patrick, MD   9 months ago Chronic back pain, unspecified back location, unspecified back pain laterality   [facility], Ranell Patrick, MD   1 year ago Migraine without status migrainosus, not intractable, unspecified migraine type   [facility], Ranell Patrick, MD   1 year ago Acute sinusitis, recurrence not specified, unspecified location   [facility], Ranell Patrick, MD   1 year ago Situational anxiety   [facility], Ranell Patrick, MD

## 2019-12-02 ENCOUNTER — Other Ambulatory Visit: Payer: Self-pay | Admitting: Family Medicine

## 2019-12-02 DIAGNOSIS — G43709 Chronic migraine without aura, not intractable, without status migrainosus: Secondary | ICD-10-CM

## 2019-12-24 ENCOUNTER — Other Ambulatory Visit: Payer: Self-pay | Admitting: Family Medicine

## 2019-12-24 DIAGNOSIS — G43709 Chronic migraine without aura, not intractable, without status migrainosus: Secondary | ICD-10-CM

## 2019-12-24 NOTE — Telephone Encounter (Signed)
Requested medication (s) are due for refill today: yes  Requested medication (s) are on the active medication list: yes  Last refill:  12/02/19  Future visit scheduled: no   Notes to clinic:  medication not delegated to NT to RF- pt already given courtesy refill- pt due for appt- [facility] no longer makes appts for this practice.   Requested Prescriptions  Pending Prescriptions Disp Refills   topiramate (TOPAMAX) 50 MG tablet [Pharmacy Med Name: TOPIRAMATE 50 MG TABLET] 60 tablet 0    Sig: TAKE 1 TABLET BY MOUTH TWICE A DAY      Not Delegated - Neurology: Anticonvulsants - topiramate & zonisamide Failed - 12/24/2019  9:34 AM      Failed - This refill cannot be delegated      Failed - Cr in normal range and within 360 days    Creat  Date Value Ref Range Status  01/29/2016 1.06 (H) 0.50 - 1.05 mg/dL Final   Creatinine, Ser  Date Value Ref Range Status  10/04/2018 1.00 0.57 - 1.00 mg/dL Final          Failed - CO2 in normal range and within 360 days    CO2  Date Value Ref Range Status  10/04/2018 20 20 - 29 mmol/L Final          Passed - Valid encounter within last 12 months    Recent Outpatient Visits           9 months ago Chronic low back pain without sciatica, unspecified back pain laterality   [facility], MD   10 months ago Chronic back pain, unspecified back location, unspecified back pain laterality   [facility], Ranell Patrick, MD   1 year ago Migraine without status migrainosus, not intractable, unspecified migraine type   [facility], Ranell Patrick, MD   1 year ago Acute sinusitis, recurrence not specified, unspecified location   [facility], Ranell Patrick, MD   1 year ago Situational anxiety   [facility], Ranell Patrick, MD

## 2020-01-04 ENCOUNTER — Other Ambulatory Visit: Payer: Self-pay | Admitting: Family Medicine

## 2020-01-04 DIAGNOSIS — G43709 Chronic migraine without aura, not intractable, without status migrainosus: Secondary | ICD-10-CM

## 2020-01-31 ENCOUNTER — Other Ambulatory Visit: Payer: Self-pay | Admitting: Family Medicine

## 2020-01-31 DIAGNOSIS — G43709 Chronic migraine without aura, not intractable, without status migrainosus: Secondary | ICD-10-CM

## 2020-01-31 NOTE — Telephone Encounter (Signed)
Requested medication (s) are due for refill today -yes  Requested medication (s) are on the active medication list -yes  Future visit scheduled -no  Last refill: 12/26/19  Notes to clinic: Request for non delegated Rx  Requested Prescriptions  Pending Prescriptions Disp Refills   topiramate (TOPAMAX) 50 MG tablet [Pharmacy Med Name: TOPIRAMATE 50 MG TABLET] 60 tablet 0    Sig: TAKE 1 TABLET BY MOUTH TWICE A DAY      Not Delegated - Neurology: Anticonvulsants - topiramate & zonisamide Failed - 01/31/2020  1:16 PM      Failed - This refill cannot be delegated      Failed - Cr in normal range and within 360 days    Creat  Date Value Ref Range Status  01/29/2016 1.06 (H) 0.50 - 1.05 mg/dL Final   Creatinine, Ser  Date Value Ref Range Status  10/04/2018 1.00 0.57 - 1.00 mg/dL Final          Failed - CO2 in normal range and within 360 days    CO2  Date Value Ref Range Status  10/04/2018 20 20 - 29 mmol/L Final          Passed - Valid encounter within last 12 months    Recent Outpatient Visits           10 months ago Chronic low back pain without sciatica, unspecified back pain laterality   [facility], MD   11 months ago Chronic back pain, unspecified back location, unspecified back pain laterality   [facility], Ranell Patrick, MD   1 year ago Migraine without status migrainosus, not intractable, unspecified migraine type   [facility], Ranell Patrick, MD   1 year ago Acute sinusitis, recurrence not specified, unspecified location   [facility], Ranell Patrick, MD   1 year ago Situational anxiety   [facility], Ranell Patrick, MD                  Requested Prescriptions  Pending Prescriptions Disp Refills   topiramate (TOPAMAX) 50 MG tablet [Pharmacy Med Name: TOPIRAMATE 50 MG TABLET] 60 tablet 0    Sig: TAKE 1 TABLET BY MOUTH TWICE A DAY      Not Delegated - Neurology:  Anticonvulsants - topiramate & zonisamide Failed - 01/31/2020  1:16 PM      Failed - This refill cannot be delegated      Failed - Cr in normal range and within 360 days    Creat  Date Value Ref Range Status  01/29/2016 1.06 (H) 0.50 - 1.05 mg/dL Final   Creatinine, Ser  Date Value Ref Range Status  10/04/2018 1.00 0.57 - 1.00 mg/dL Final          Failed - CO2 in normal range and within 360 days    CO2  Date Value Ref Range Status  10/04/2018 20 20 - 29 mmol/L Final          Passed - Valid encounter within last 12 months    Recent Outpatient Visits           10 months ago Chronic low back pain without sciatica, unspecified back pain laterality   [facility] [place], MD   11 months ago Chronic back pain, unspecified back location, unspecified back pain laterality   [facility], Ranell Patrick, MD   1 year  ago Migraine without status migrainosus, not intractable, unspecified migraine type   [facility], Ranell Patrick, MD   1 year ago Acute sinusitis, recurrence not specified, unspecified location   [facility], Ranell Patrick, MD   1 year ago Situational anxiety   [facility], Ranell Patrick, MD

## 2021-05-13 IMAGING — DX RIGHT KNEE - 1-2 VIEW
2 series · 2 of 2 positions shown · non-contrast
Comparison: 03/15/2018, 08/19/2016.

CLINICAL DATA: BILATERAL knee pain of unspecified chronicity.

EXAM:
RIGHT KNEE - 1-2 VIEW

[knee ap]
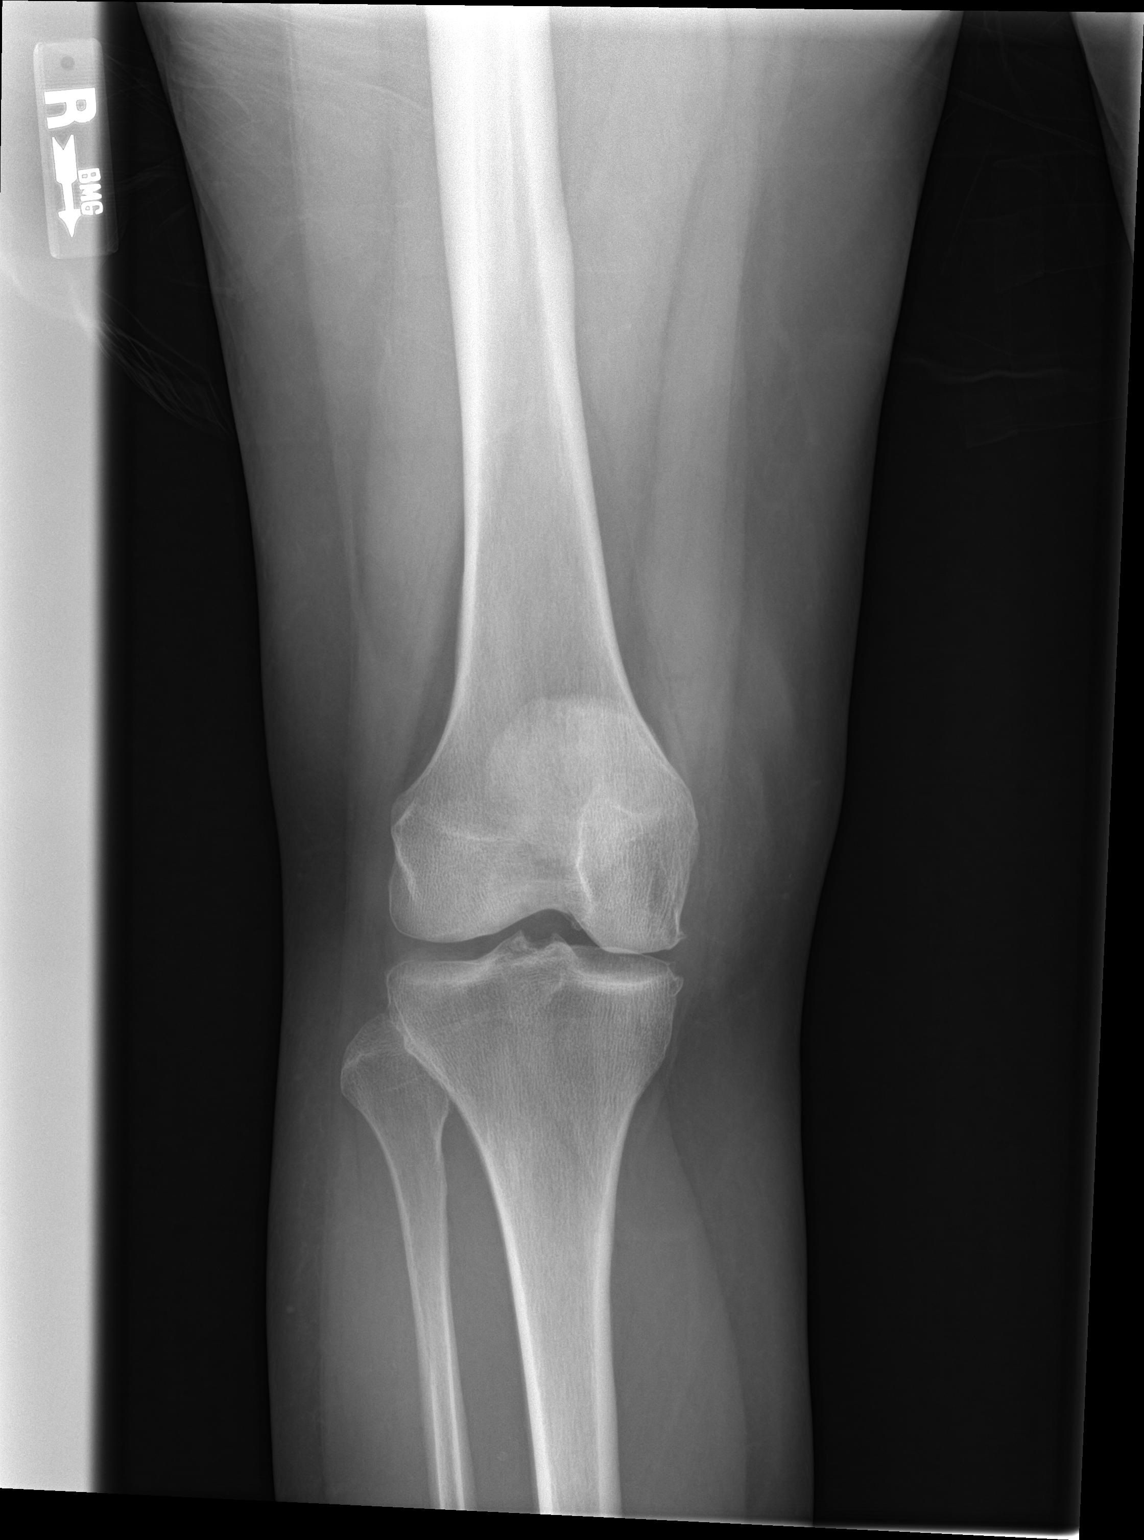

[knee lat]
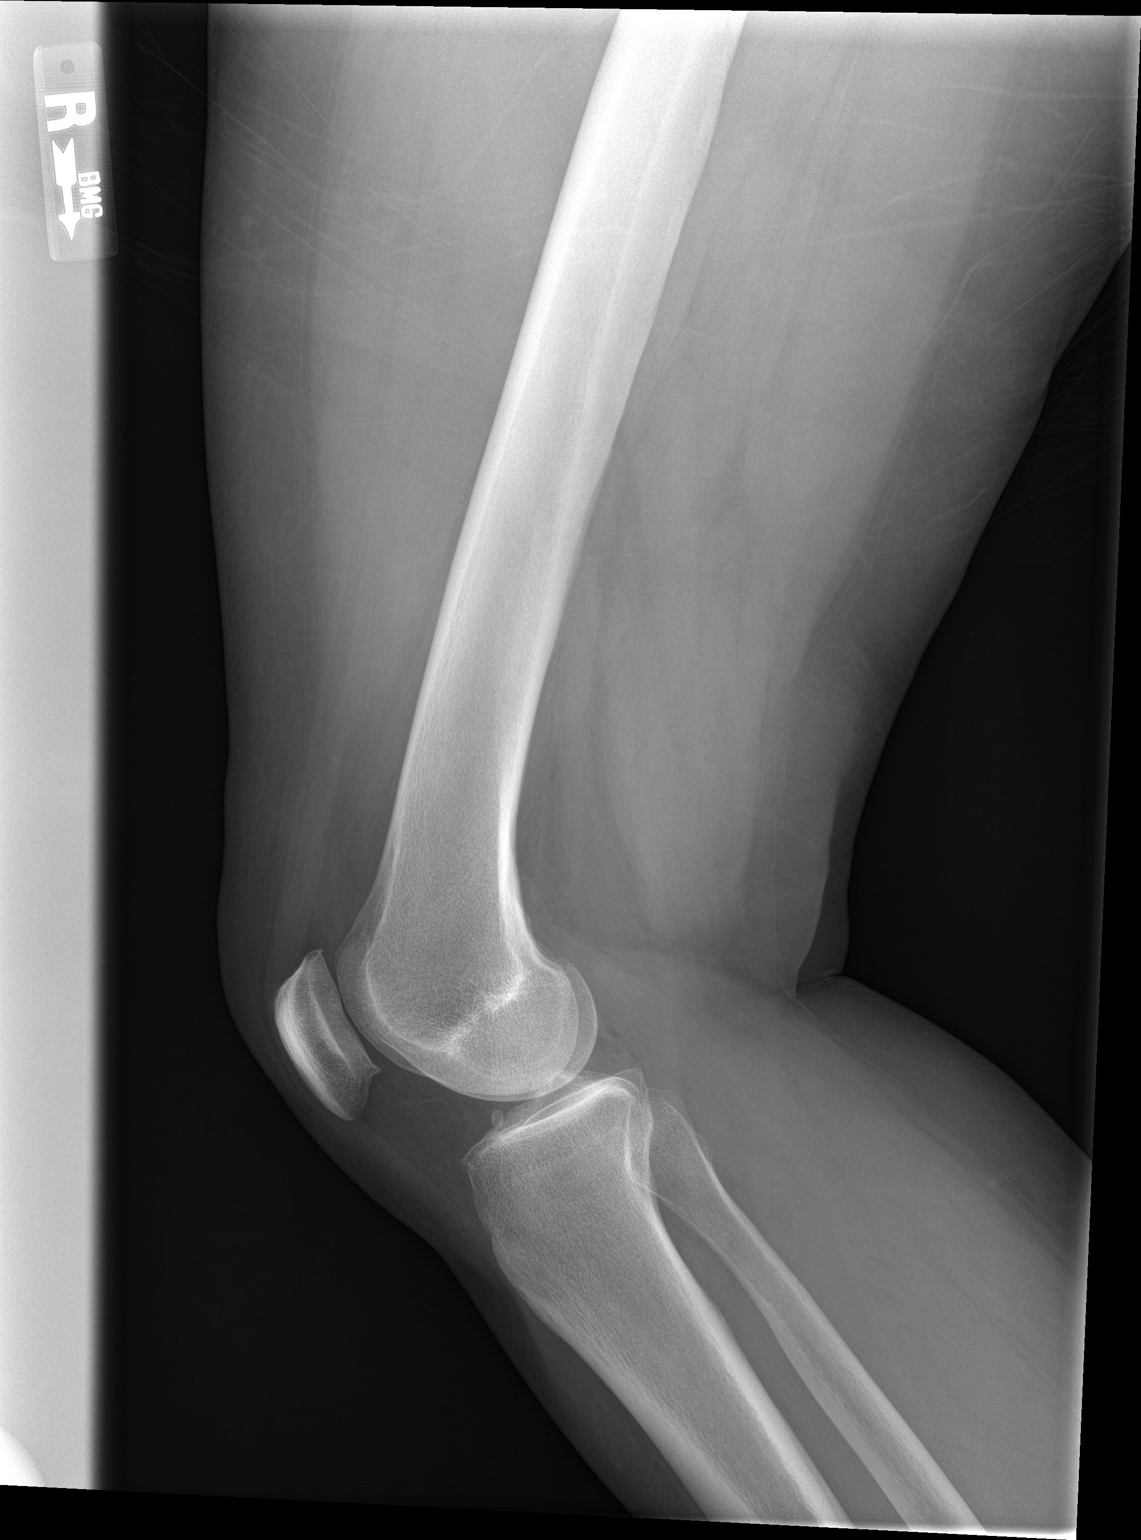

[2 of 2 positions shown; findings below may reference images not displayed]

FINDINGS: No evidence of acute or subacute fracture or dislocation.
Well-preserved joint spaces. Minimal spurring involving the LATERAL
compartment, new since the prior examinations. Minimal spurring
along the undersurface of the patella, unchanged since July 2016. Well-preserved bone mineral density. No visible joint
effusion.
IMPRESSION: 1. No acute or subacute osseous abnormality.
2. Minimal degenerative changes involving the LATERAL compartment
and minimal spurring along the undersurface of the patella.

## 2021-05-13 IMAGING — DX LEFT KNEE - 1-2 VIEW
2 series · 2 of 2 positions shown · non-contrast
Comparison: 08/19/2016.

CLINICAL DATA: BILATERAL knee pain of unspecified chronicity.

EXAM:
LEFT KNEE - 1-2 VIEW

[knee ap]
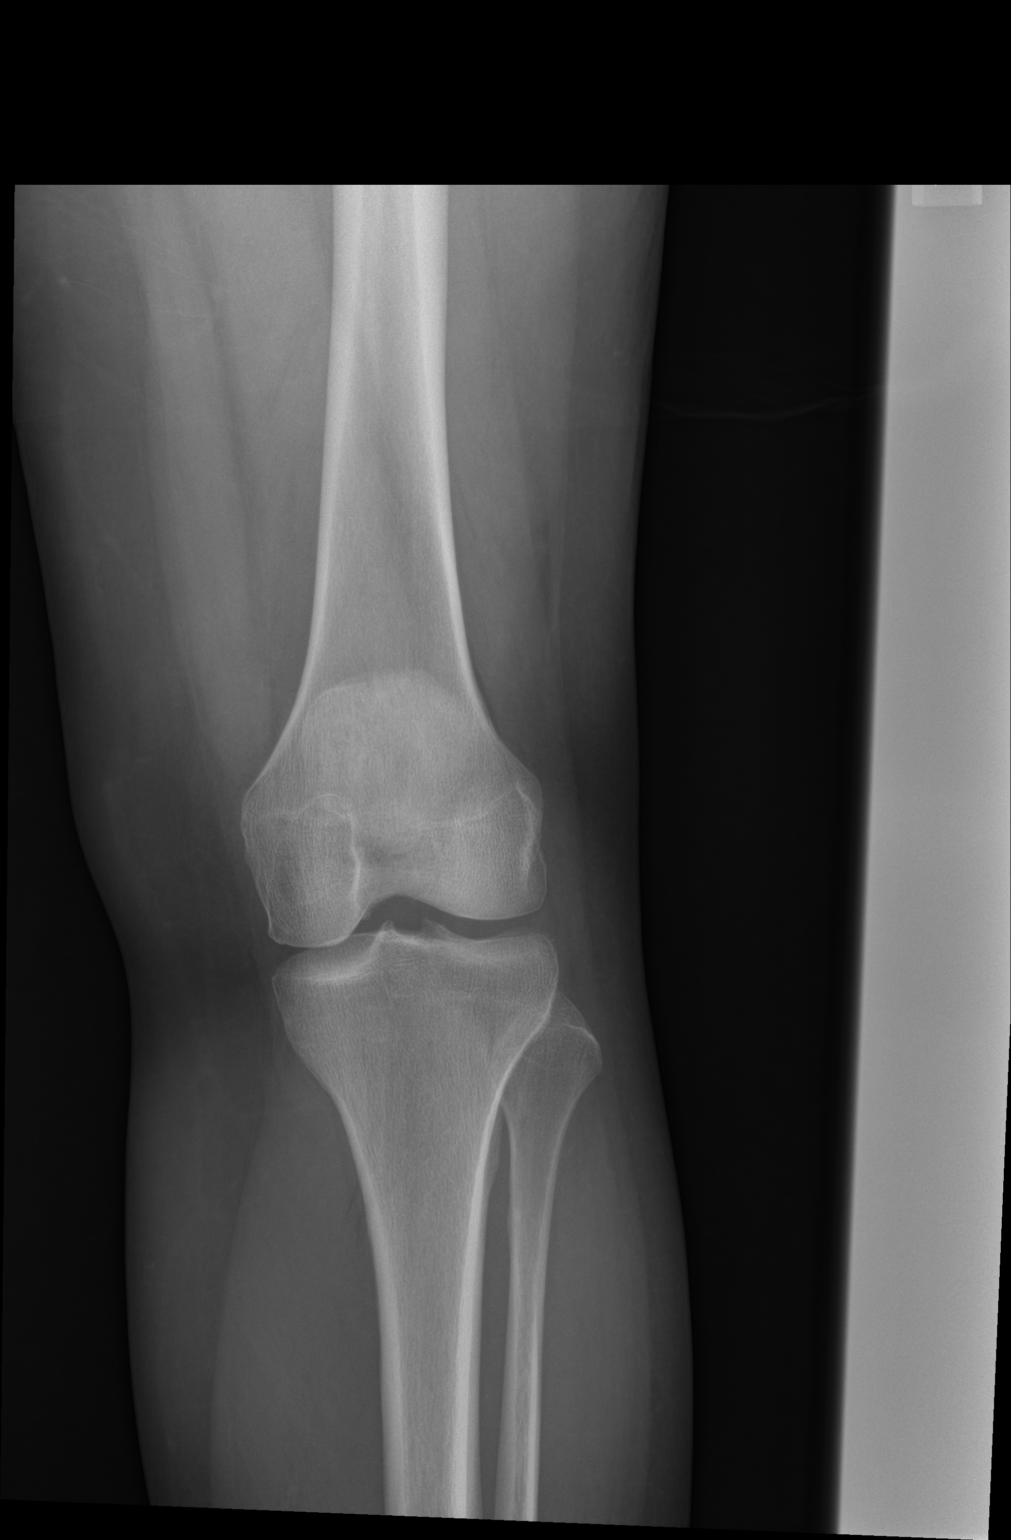

[knee lat]
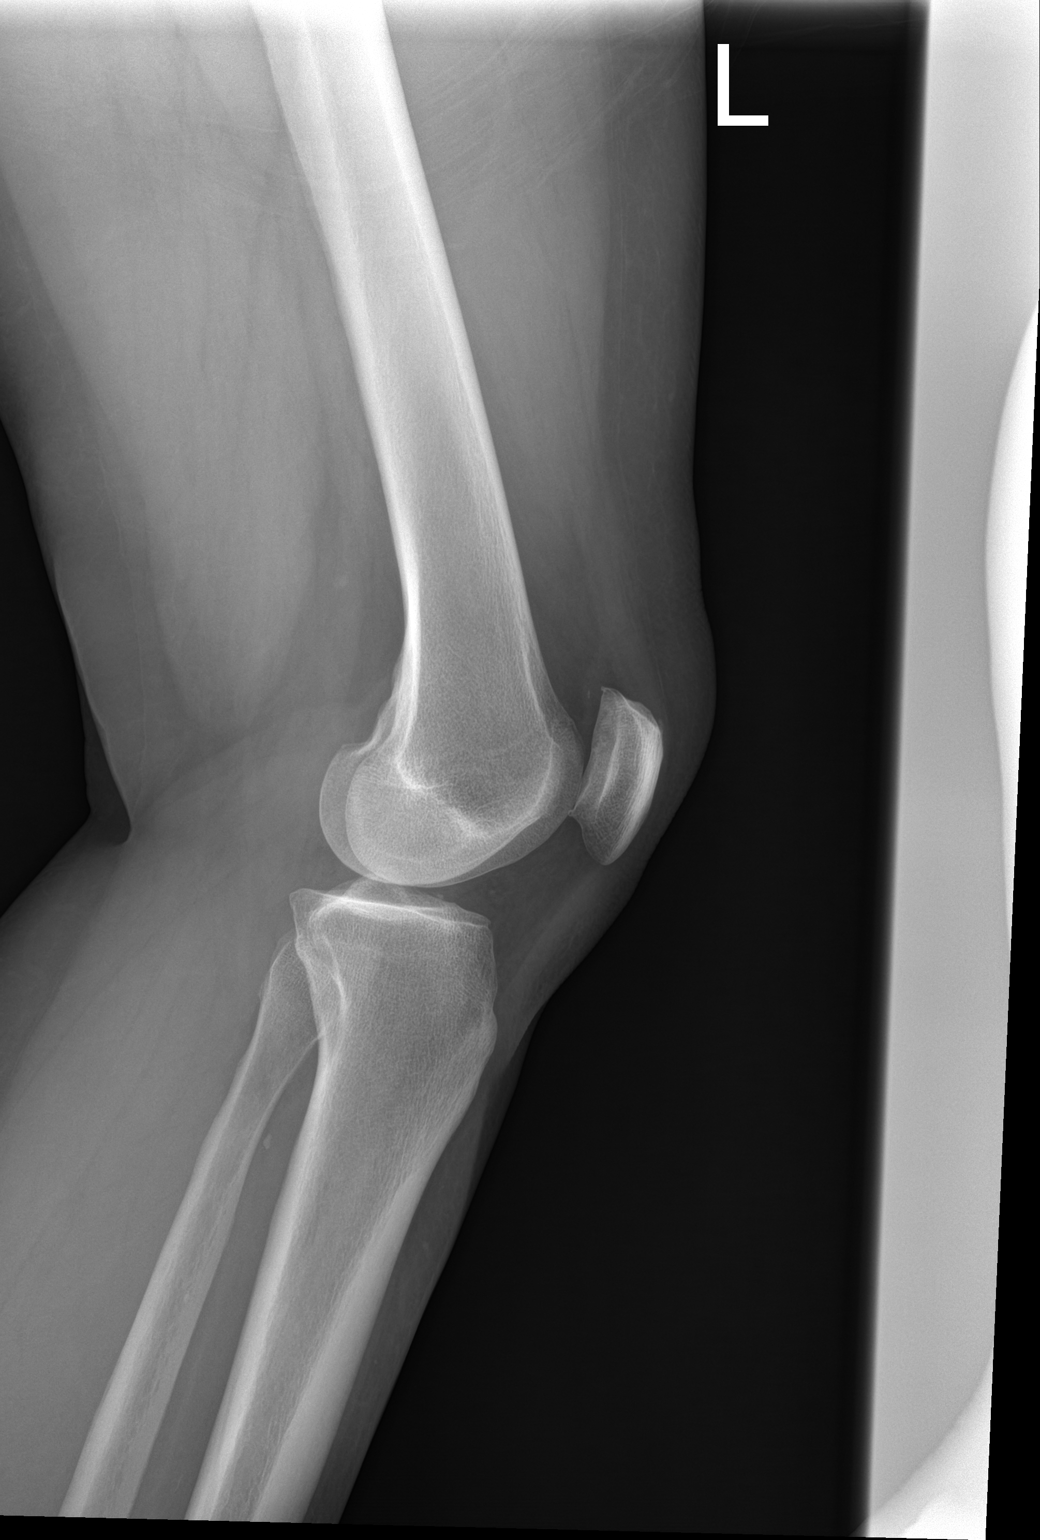

[2 of 2 positions shown; findings below may reference images not displayed]

FINDINGS: No evidence of acute fracture or dislocation. Well-preserved joint
spaces, with minimal spurring along the undersurface of the patella
which is unchanged. Well-preserved bone mineral density. No visible
joint effusion.
IMPRESSION: 1. No acute or subacute osseous abnormality.
2. Minimal spurring along the undersurface of the patella, stable
since July 2016.

## 2022-04-09 ENCOUNTER — Emergency Department (HOSPITAL_BASED_OUTPATIENT_CLINIC_OR_DEPARTMENT_OTHER): Payer: Medicare Other

## 2022-04-09 ENCOUNTER — Ambulatory Visit (HOSPITAL_COMMUNITY): Payer: Medicare Other

## 2022-04-09 ENCOUNTER — Emergency Department (HOSPITAL_COMMUNITY): Payer: Medicare Other

## 2022-04-09 ENCOUNTER — Other Ambulatory Visit: Payer: Self-pay

## 2022-04-09 ENCOUNTER — Inpatient Hospital Stay (HOSPITAL_BASED_OUTPATIENT_CLINIC_OR_DEPARTMENT_OTHER)
Admission: EM | Admit: 2022-04-09 | Discharge: 2022-04-15 | DRG: 025 | Disposition: A | Discharge status: Home / Self Care | Payer: Medicare Other | Attending: Neurosurgery | Admitting: Neurosurgery

## 2022-04-09 DIAGNOSIS — R410 Disorientation, unspecified: Secondary | ICD-10-CM | POA: Diagnosis present

## 2022-04-09 DIAGNOSIS — Z20822 Contact with and (suspected) exposure to covid-19: Secondary | ICD-10-CM | POA: Diagnosis present

## 2022-04-09 DIAGNOSIS — G936 Cerebral edema: Secondary | ICD-10-CM | POA: Diagnosis present

## 2022-04-09 DIAGNOSIS — G8191 Hemiplegia, unspecified affecting right dominant side: Secondary | ICD-10-CM | POA: Diagnosis present

## 2022-04-09 DIAGNOSIS — I10 Essential (primary) hypertension: Secondary | ICD-10-CM | POA: Diagnosis present

## 2022-04-09 DIAGNOSIS — G9389 Other specified disorders of brain: Secondary | ICD-10-CM | POA: Diagnosis not present

## 2022-04-09 DIAGNOSIS — C719 Malignant neoplasm of brain, unspecified: Secondary | ICD-10-CM | POA: Diagnosis present

## 2022-04-09 DIAGNOSIS — E785 Hyperlipidemia, unspecified: Secondary | ICD-10-CM | POA: Diagnosis present

## 2022-04-09 DIAGNOSIS — Z79899 Other long term (current) drug therapy: Secondary | ICD-10-CM

## 2022-04-09 DIAGNOSIS — Z791 Long term (current) use of non-steroidal anti-inflammatories (NSAID): Secondary | ICD-10-CM | POA: Diagnosis not present

## 2022-04-09 DIAGNOSIS — R569 Unspecified convulsions: Secondary | ICD-10-CM | POA: Diagnosis not present

## 2022-04-09 DIAGNOSIS — R531 Weakness: Secondary | ICD-10-CM | POA: Diagnosis not present

## 2022-04-09 DIAGNOSIS — Z9049 Acquired absence of other specified parts of digestive tract: Secondary | ICD-10-CM

## 2022-04-09 DIAGNOSIS — R4701 Aphasia: Secondary | ICD-10-CM | POA: Diagnosis present

## 2022-04-09 DIAGNOSIS — D496 Neoplasm of unspecified behavior of brain: Secondary | ICD-10-CM | POA: Diagnosis not present

## 2022-04-09 DIAGNOSIS — R29810 Facial weakness: Secondary | ICD-10-CM | POA: Diagnosis present

## 2022-04-09 DIAGNOSIS — C711 Malignant neoplasm of frontal lobe: Principal | ICD-10-CM | POA: Diagnosis present

## 2022-04-09 LAB — RESP PANEL BY RT-PCR (FLU A&B, COVID) ARPGX2
Influenza A by PCR: NEGATIVE
Influenza B by PCR: NEGATIVE
SARS Coronavirus 2 by RT PCR: NEGATIVE

## 2022-04-09 LAB — DIFFERENTIAL
Abs Immature Granulocytes: 0.03 10*3/uL (ref 0.00–0.07)
Basophils Absolute: 0.1 10*3/uL (ref 0.0–0.1)
Basophils Relative: 1 %
Eosinophils Absolute: 0.2 10*3/uL (ref 0.0–0.5)
Eosinophils Relative: 2 %
Immature Granulocytes: 0 %
Lymphocytes Relative: 20 %
Lymphs Abs: 1.7 10*3/uL (ref 0.7–4.0)
Monocytes Absolute: 0.6 10*3/uL (ref 0.1–1.0)
Monocytes Relative: 6 %
Neutro Abs: 6.3 10*3/uL (ref 1.7–7.7)
Neutrophils Relative %: 71 %

## 2022-04-09 LAB — COMPREHENSIVE METABOLIC PANEL
ALT: 36 U/L (ref 0–44)
AST: 20 U/L (ref 15–41)
Albumin: 4.5 g/dL (ref 3.5–5.0)
Alkaline Phosphatase: 61 U/L (ref 38–126)
Anion gap: 8 (ref 5–15)
BUN: 32 mg/dL — ABNORMAL HIGH (ref 8–23)
CO2: 22 mmol/L (ref 22–32)
Calcium: 9.4 mg/dL (ref 8.9–10.3)
Chloride: 105 mmol/L (ref 98–111)
Creatinine, Ser: 1.05 mg/dL — ABNORMAL HIGH (ref 0.44–1.00)
GFR, Estimated: 59 mL/min — ABNORMAL LOW (ref >60)
Glucose, Bld: 92 mg/dL (ref 70–99)
Potassium: 4.1 mmol/L (ref 3.5–5.1)
Sodium: 135 mmol/L (ref 135–145)
Total Bilirubin: 0.7 mg/dL (ref 0.3–1.2)
Total Protein: 6.9 g/dL (ref 6.5–8.1)

## 2022-04-09 LAB — RAPID URINE DRUG SCREEN, HOSP PERFORMED
Amphetamines: NOT DETECTED
Barbiturates: NOT DETECTED
Benzodiazepines: NOT DETECTED
Cocaine: NOT DETECTED
Opiates: NOT DETECTED
Tetrahydrocannabinol: NOT DETECTED

## 2022-04-09 LAB — URINALYSIS, ROUTINE W REFLEX MICROSCOPIC
Bilirubin Urine: NEGATIVE
Glucose, UA: NEGATIVE mg/dL
Ketones, ur: NEGATIVE mg/dL
Nitrite: NEGATIVE
Protein, ur: NEGATIVE mg/dL
Specific Gravity, Urine: 1.036 — ABNORMAL HIGH (ref 1.005–1.030)
pH: 7 (ref 5.0–8.0)

## 2022-04-09 LAB — CBC
HCT: 41.7 % (ref 36.0–46.0)
Hemoglobin: 14 g/dL (ref 12.0–15.0)
MCH: 31.9 pg (ref 26.0–34.0)
MCHC: 33.6 g/dL (ref 30.0–36.0)
MCV: 95 fL (ref 80.0–100.0)
Platelets: 249 10*3/uL (ref 150–400)
RBC: 4.39 MIL/uL (ref 3.87–5.11)
RDW: 12.1 % (ref 11.5–15.5)
WBC: 8.8 10*3/uL (ref 4.0–10.5)
nRBC: 0 % (ref 0.0–0.2)

## 2022-04-09 LAB — PROTIME-INR
INR: 1 (ref 0.8–1.2)
Prothrombin Time: 13.3 seconds (ref 11.4–15.2)

## 2022-04-09 LAB — CBG MONITORING, ED: Glucose-Capillary: 84 mg/dL (ref 70–99)

## 2022-04-09 LAB — ETHANOL: Alcohol, Ethyl (B): <10 mg/dL (ref <10)

## 2022-04-09 LAB — APTT: aPTT: 34 seconds (ref 24–36)

## 2022-04-09 MED ORDER — FLUTICASONE PROPIONATE 50 MCG/ACT NA SUSP
2.0000 | Freq: Every day | NASAL | Status: DC
  Filled 2022-04-09: qty 16

## 2022-04-09 MED ORDER — AMLODIPINE BESYLATE 5 MG PO TABS
5.0000 mg | ORAL_TABLET | Freq: Every day | ORAL | Status: DC
  Administered 2022-04-10 – 2022-04-15 (×6): 5 mg via ORAL
  Filled 2022-04-09 (×6): qty 1

## 2022-04-09 MED ORDER — ONDANSETRON HCL 4 MG/2ML IJ SOLN: 4.0000 mg | Freq: Four times a day (QID) | INTRAMUSCULAR | Status: DC | PRN

## 2022-04-09 MED ORDER — ACETAMINOPHEN 325 MG PO TABS
650.0000 mg | ORAL_TABLET | Freq: Four times a day (QID) | ORAL | Status: DC | PRN
  Administered 2022-04-12 – 2022-04-15 (×4): 650 mg via ORAL
  Filled 2022-04-09 (×4): qty 2

## 2022-04-09 MED ORDER — TRAZODONE HCL 50 MG PO TABS: 25.0000 mg | ORAL_TABLET | Freq: Every evening | ORAL | Status: DC | PRN

## 2022-04-09 MED ORDER — HYDROCODONE-ACETAMINOPHEN 5-325 MG PO TABS
1.0000 | ORAL_TABLET | ORAL | Status: DC | PRN
  Administered 2022-04-10 – 2022-04-12 (×3): 2 via ORAL
  Administered 2022-04-13 – 2022-04-14 (×5): 1 via ORAL
  Filled 2022-04-09: qty 1
  Filled 2022-04-09 (×2): qty 2
  Filled 2022-04-09 (×3): qty 1
  Filled 2022-04-09: qty 2
  Filled 2022-04-09: qty 1

## 2022-04-09 MED ORDER — HEPARIN SODIUM (PORCINE) 5000 UNIT/ML IJ SOLN
5000.0000 [IU] | Freq: Three times a day (TID) | INTRAMUSCULAR | Status: DC
  Administered 2022-04-09 – 2022-04-15 (×17): 5000 [IU] via SUBCUTANEOUS
  Filled 2022-04-09 (×17): qty 1

## 2022-04-09 MED ORDER — LEVETIRACETAM IN NACL 1500 MG/100ML IV SOLN: 1500.0000 mg | Freq: Once | INTRAVENOUS | Status: DC

## 2022-04-09 MED ORDER — LEVETIRACETAM 500 MG PO TABS
500.0000 mg | ORAL_TABLET | Freq: Two times a day (BID) | ORAL | Status: DC
  Administered 2022-04-09 – 2022-04-15 (×12): 500 mg via ORAL
  Filled 2022-04-09 (×13): qty 1

## 2022-04-09 MED ORDER — IOHEXOL 350 MG/ML SOLN
100.0000 mL | Freq: Once | INTRAVENOUS | Status: AC | PRN
  Administered 2022-04-09: 100 mL via INTRAVENOUS

## 2022-04-09 MED ORDER — LEVETIRACETAM IN NACL 1000 MG/100ML IV SOLN
1000.0000 mg | Freq: Once | INTRAVENOUS | Status: AC
  Administered 2022-04-09: 1000 mg via INTRAVENOUS
  Filled 2022-04-09: qty 100

## 2022-04-09 MED ORDER — BUSPIRONE HCL 10 MG PO TABS
5.0000 mg | ORAL_TABLET | Freq: Three times a day (TID) | ORAL | Status: DC
  Administered 2022-04-09 – 2022-04-15 (×17): 5 mg via ORAL
  Filled 2022-04-09 (×17): qty 1

## 2022-04-09 MED ORDER — ONDANSETRON HCL 4 MG PO TABS: 4.0000 mg | ORAL_TABLET | Freq: Four times a day (QID) | ORAL | Status: DC | PRN

## 2022-04-09 MED ORDER — DEXAMETHASONE 4 MG PO TABS
4.0000 mg | ORAL_TABLET | Freq: Four times a day (QID) | ORAL | Status: DC
  Administered 2022-04-09 – 2022-04-12 (×8): 4 mg via ORAL
  Filled 2022-04-09 (×8): qty 1

## 2022-04-09 MED ORDER — GADOBUTROL 1 MMOL/ML IV SOLN
8.0000 mL | Freq: Once | INTRAVENOUS | Status: AC | PRN
  Administered 2022-04-09: 8 mL via INTRAVENOUS

## 2022-04-09 MED ORDER — BISACODYL 5 MG PO TBEC: 5.0000 mg | DELAYED_RELEASE_TABLET | Freq: Every day | ORAL | Status: DC | PRN

## 2022-04-09 MED ORDER — ATORVASTATIN CALCIUM 10 MG PO TABS
10.0000 mg | ORAL_TABLET | Freq: Every day | ORAL | Status: DC
  Administered 2022-04-10 – 2022-04-15 (×6): 10 mg via ORAL
  Filled 2022-04-09 (×6): qty 1

## 2022-04-09 MED ORDER — LEVETIRACETAM IN NACL 500 MG/100ML IV SOLN
500.0000 mg | Freq: Once | INTRAVENOUS | Status: AC
  Administered 2022-04-09: 500 mg via INTRAVENOUS
  Filled 2022-04-09: qty 100

## 2022-04-09 MED ORDER — LOSARTAN POTASSIUM 50 MG PO TABS
50.0000 mg | ORAL_TABLET | Freq: Every day | ORAL | Status: DC
  Administered 2022-04-10 – 2022-04-15 (×6): 50 mg via ORAL
  Filled 2022-04-09 (×6): qty 1

## 2022-04-09 MED ORDER — POTASSIUM CHLORIDE IN NACL 20-0.9 MEQ/L-% IV SOLN
INTRAVENOUS | Status: DC
Start: 2022-04-09 — End: 2022-04-15
  Filled 2022-04-09 (×7): qty 1000

## 2022-04-09 MED ORDER — OXYBUTYNIN CHLORIDE ER 5 MG PO TB24
5.0000 mg | ORAL_TABLET | Freq: Every day | ORAL | Status: DC
  Administered 2022-04-10 – 2022-04-14 (×6): 5 mg via ORAL
  Filled 2022-04-09 (×7): qty 1

## 2022-04-09 MED ORDER — ACETAMINOPHEN 650 MG RE SUPP: 650.0000 mg | Freq: Four times a day (QID) | RECTAL | Status: DC | PRN

## 2022-04-09 MED ORDER — SENNA 8.6 MG PO TABS
1.0000 | ORAL_TABLET | Freq: Two times a day (BID) | ORAL | Status: DC
  Administered 2022-04-09 – 2022-04-15 (×10): 8.6 mg via ORAL
  Filled 2022-04-09 (×10): qty 1

## 2022-04-09 MED ORDER — TOPIRAMATE 25 MG PO TABS
50.0000 mg | ORAL_TABLET | Freq: Two times a day (BID) | ORAL | Status: DC
  Administered 2022-04-09 – 2022-04-15 (×12): 50 mg via ORAL
  Filled 2022-04-09 (×13): qty 2

## 2022-04-09 MED ORDER — LEVETIRACETAM IN NACL 500 MG/100ML IV SOLN
500.0000 mg | Freq: Two times a day (BID) | INTRAVENOUS | Status: DC
Start: 2022-04-09 — End: 2022-04-12
  Filled 2022-04-09: qty 100

## 2022-04-09 MED ORDER — MORPHINE SULFATE (PF) 2 MG/ML IV SOLN: 2.0000 mg | INTRAVENOUS | Status: DC | PRN

## 2022-04-09 MED ORDER — DEXAMETHASONE SODIUM PHOSPHATE 10 MG/ML IJ SOLN
10.0000 mg | Freq: Once | INTRAMUSCULAR | Status: AC
  Administered 2022-04-09: 10 mg via INTRAVENOUS
  Filled 2022-04-09: qty 1

## 2022-04-09 MED ORDER — SENNOSIDES-DOCUSATE SODIUM 8.6-50 MG PO TABS: 1.0000 | ORAL_TABLET | Freq: Every evening | ORAL | Status: DC | PRN

## 2022-04-09 NOTE — Consult Note (Signed)
NEUROLOGY TELECONSULTATION NOTE   Date of service: April 09, 2022 [name] MRN:  [id number] DOB:  [birth year] Reason for consult: telestroke for aphasia and R sided weakness  Requesting Provider: Dr. Shirlyn Goltz Consult Participants: myself, bedside RN, telestroke RN Location of the provider: [facility], [place] Location of the patient: [facility]  This consult was provided via telemedicine with 2-way video and audio communication. The patient/family was informed that care would be provided in this way and agreed to receive care in this manner.   _ _ _   _ __   _ __ _ _  __ __   _ __   __ _  History of Present Illness   This is a 66 yo woman with pmhx significant for HL, HTN, migraine who was BIB EMS for R sided weakness face/arm/leg and aphasia. LKW [id number] when she was last seen by son. When daughter arrived later she stated that patient had actually been having word-finding difficulties for the past few days. They became very pronounced this afternoon, and have since been improving. NIHSS = 5 on my examination. CT head concerning for large frontal mass lesion. CTA no LVO, CTP neg. TNK not administered 2/2 not a stroke and contraindication of brain tumor.   Formal radiology reads:  CT head wo 1. Large low-density mass in the left frontal lobe with surrounding white matter hypodensity. Irregular peripheral enhancement following contrast infusion on CT angiogram. This is most compatible with malignancy. Favor glioblastoma. Metastatic disease also a consideration. Local mass-effect and mild midline shift. 2. MRI brain without with contrast recommended for further evaluation.  CTA H&N / CTP 1. Mass lesion left frontal lobe shows peripheral enhancement. There is surrounding edema and mass-effect. This is most likely neoplasm. Favor glioblastoma. Recommend MRI head without and with contrast. 2. Mild atherosclerotic disease in the carotid bifurcation bilaterally. No significant  carotid or vertebral artery stenosis 3. No intracranial stenosis or large vessel occlusion.  All CNS imaging personally reviewed and discussed with radiology by phone.    ROS   UTA 2/2 aphasia  Past History   The following was personally reviewed:  Past Medical History:  Diagnosis Date   Hyperlipidemia    Hypertension    Migraine    Past Surgical History:  Procedure Laterality Date   APPENDECTOMY     LAPAROSCOPIC APPENDECTOMY  07/15/2012   Procedure: APPENDECTOMY LAPAROSCOPIC;  Surgeon: Zenovia Jarred, MD;  Location: [facility];  Service: General;  Laterality: N/A;  Laparoscopic Appendectomy   Family History  Adopted: Yes   Social History   Socioeconomic History   Marital status: Widowed    Spouse name: Not on file   Number of children: 2   Years of education: Not on file   Highest education level: Not on file  Occupational History   Occupation: retail    Comment: [employer] in [place], [place]  Tobacco Use   Smoking status: Never   Smokeless tobacco: Never  Substance and Sexual Activity   Alcohol use: Yes    Comment: 2 drinks once every 2-3 months   Drug use: No   Sexual activity: Not on file  Other Topics Concern   Not on file  Social History Narrative   Marital status: widowed since 2008; husband was bipolar and alcoholic      Children: 2 children (28, 68); son lives with patient.Daughter lives in [place]/autism/does not drive.  Daughter lives with mother-in-law.  One grandpuppy      Lives:  with son      Employment: works at [employer]; also works in Environmental manager.      Tobacco: none      Alcohol: none; husband was an alcoholic.      Drugs:   none      Exercise:   Social Determinants of Health   Financial Resource Strain: Not on file  Food Insecurity: Not on file  Transportation Needs: Not on file  Physical Activity: Not on file  Stress: Not on file  Social Connections: Not on file   No Known Allergies  Medications   (Not in a  hospital admission)    No current facility-administered medications for this encounter.  Current Outpatient Medications:    amLODipine (NORVASC) 5 MG tablet, TAKE 1 TABLET BY MOUTH EVERY DAY, Disp: 90 tablet, Rfl: 1   atorvastatin (LIPITOR) 10 MG tablet, TAKE 1 TABLET BY MOUTH EVERY DAY, Disp: 90 tablet, Rfl: 1   busPIRone (BUSPAR) 5 MG tablet, TAKE 1 TABLET BY MOUTH THREE TIMES A DAY, Disp: 270 tablet, Rfl: 1   fluticasone (FLONASE) 50 MCG/ACT nasal spray, SPRAY 2 SPRAYS INTO EACH NOSTRIL EVERY DAY, Disp: 48 mL, Rfl: 0   losartan (COZAAR) 50 MG tablet, TAKE 1 TABLET BY MOUTH EVERY DAY, Disp: 90 tablet, Rfl: 1   meloxicam (MOBIC) 7.5 MG tablet, TAKE 1-2 TABLETS (7.5-15 MG TOTAL) BY MOUTH DAILY., Disp: 30 tablet, Rfl: 2   Omega-3 Fatty Acids (OMEGA-3 FISH OIL PO), Take 1 capsule by mouth daily at 6 (six) AM., Disp: , Rfl:    oxybutynin (DITROPAN-XL) 5 MG 24 hr tablet, TAKE 1 TABLET BY MOUTH EVERYDAY AT BEDTIME, Disp: 90 tablet, Rfl: 1   topiramate (TOPAMAX) 50 MG tablet, TAKE 1 TABLET BY MOUTH TWICE A DAY, Disp: 60 tablet, Rfl: 0   traZODone (DESYREL) 50 MG tablet, TAKE 0.5-1 TABLETS (25-50 MG TOTAL) BY MOUTH AT BEDTIME AS NEEDED FOR SLEEP., Disp: 30 tablet, Rfl: 0  Vitals   Vitals:   04/09/22 1735  BP: 136/75  Pulse: 69  Resp: 16  Temp: 97.7 F (36.5 C)  TempSrc: Oral  SpO2: 100%     There is no height or weight on file to calculate BMI.  Physical Exam   Exam performed over telemedicine with 2-way video and audio communication and with assistance of bedside RN  Physical Exam Gen: A&O x4, NAD Resp: normal WOB CV: extremities appear well-perfused  Neuro: *MS: A&O x3 but gets confused in conversation. Follows simple commands but requires some prompting. *Speech: nondysarthric, mild-to-moderate expressive>receptive aphasia *CN: PERRL 54m, EOMI, blinks to threat bilat, sensory impairment R face, R UMN facial droop, hearing intact to voice *Motor:   Normal bulk.  No tremor,  rigidity or bradykinesia. Full strength without drift LUE and LLE. Drift but not to bed RUE and RLE. *Sensory: R sided sensory deficit to LT in arm and leg. No double-simultaneous extinction.  *Coordination:  FNF intact bilat *Reflexes:  UTA 2/2 tele-exam *Gait: deferred  NIHSS  1a Level of Conscious.: 0 1b LOC Questions: 0 1c LOC Commands: 0 2 Best Gaze: 0 3 Visual: 0 4 Facial Palsy: 1 5a Motor Arm - left: 0 5b Motor Arm - Right: 1 6a Motor Leg - Left: 0 6b Motor Leg - Right: 1 7 Limb Ataxia: 0 8 Sensory: 1 9 Best Language: 1 10 Dysarthria: 0 11 Extinct. and Inatten.: 0  TOTAL: 5   Premorbid mRS = 1   Labs   CBC: No results for input(s): "WBC", "  NEUTROABS", "HGB", "HCT", "MCV", "PLT" in the last 168 hours.  Basic Metabolic Panel:  Lab Results  Component Value Date   NA 140 10/04/2018   K 4.5 10/04/2018   CO2 20 10/04/2018   GLUCOSE 90 10/04/2018   BUN 24 10/04/2018   CREATININE 1.00 10/04/2018   CALCIUM 9.5 10/04/2018   GFRNONAA 61 10/04/2018   GFRAA 70 10/04/2018   Lipid Panel:  Lab Results  Component Value Date   LDLCALC 93 10/04/2018   HgbA1c:  Lab Results  Component Value Date   HGBA1C 5.1 04/01/2017   Urine Drug Screen: No results found for: "LABOPIA", "COCAINSCRNUR", "LABBENZ", "AMPHETMU", "THCU", "LABBARB"  Alcohol Level No results found for: "ETH"   Impression   This is a 65 yo woman with pmhx significant for HL, HTN, migraine who was BIB EMS for R sided weakness face/arm/leg and aphasia found to have large peripherally-enhancing mass lesion with surrounding edema on CT/CTA concerning for neoplasm, favor glioblastoma. I suspect the acute worsening and subsequent partial improvement of her R sided weakness and aphasia this afternoon may have been 2/2 seizure. Will transfer to [facility] for MRI brain wwo contrast and further eval and mgmt.   Recommendations   - Transfer patient to [facility]. D/w Dr. Darl Householder, if no beds available, patient should be  transferred ED to ED. Please notify [facility] neurohospitalist when patient arrives - MRI brain with and without contrast. Depending on results of MRI, patient will likely need to be started on decadron and have consults placed to oncology and neurosurgery. - Keppra '20mg'$ /kg load f/b '500mg'$  q 12 hrs - '2mg'$  IV ativan prn for seizure activity - EEG after MRI - Further guidance per [facility] neurohospitalist after arrival to [facility] - I did speak with patient and her son and daughter via video and tell them the imaging findings are concerning for brain tumor and that she will be transferred to [facility] for further evaluation. ______________________________________________________________________   Thank you for the opportunity to take part in the care of this patient. If you have any further questions, please contact the neurology consultation attending.  Signed,  Su Monks, MD [facility] [phone]  If 7pm- 7am, please page neurology on call as listed in [place].

## 2022-04-09 NOTE — ED Notes (Signed)
Neurologist at bedside. 

## 2022-04-09 NOTE — ED Notes (Signed)
Patient arrived to room is able to answer questions correctly, awaiting MRI scan.   Denies pain and is resting comfortably.

## 2022-04-09 NOTE — ED Notes (Addendum)
Patient was able to transfer onto ED stretcher without any problems, steady gait.   No needsconcerns expressed at this time.

## 2022-04-09 NOTE — H&P (Signed)
Jennifer [name] is an 66 y.o. female.   Chief Complaint: memory deficits HPI: whom was seen by her son this am and carried on a normal conversation. A friend went by later, called the son due to unusual behavior, and she was brought to the ED. A head CT showed a left frontal abnormality, mri showed a left irregularly ring enhancing mass, with a hypodense core, and surrounding edema. Called for evaluation.  Past Medical History:  Diagnosis Date   Hyperlipidemia    Hypertension    Migraine     Past Surgical History:  Procedure Laterality Date   APPENDECTOMY     LAPAROSCOPIC APPENDECTOMY  07/15/2012   Procedure: APPENDECTOMY LAPAROSCOPIC;  Surgeon: Zenovia Jarred, MD;  Location: [facility];  Service: General;  Laterality: N/A;  Laparoscopic Appendectomy    Family History  Adopted: Yes   Social History:  reports that she has never smoked. She has never used smokeless tobacco. She reports current alcohol use. She reports that she does not use drugs.  Allergies: No Known Allergies  (Not in a hospital admission)   Results for orders placed or performed during the hospital encounter of 04/09/22 (from the past 48 hour(s))  CBG monitoring, ED     Status: None   Collection Time: 04/09/22  5:41 PM  Result Value Ref Range   Glucose-Capillary 84 70 - 99 mg/dL    Comment: Glucose reference range applies only to samples taken after fasting for at least 8 hours.  Ethanol     Status: None   Collection Time: 04/09/22  5:42 PM  Result Value Ref Range   Alcohol, Ethyl (B) <10 <10 mg/dL    Comment: (NOTE) Lowest detectable limit for serum alcohol is 10 mg/dL.  For medical purposes only. Performed at [facility], [address], [place], [place] [address]   Protime-INR     Status: None   Collection Time: 04/09/22  5:42 PM  Result Value Ref Range   Prothrombin Time 13.3 11.4 - 15.2 seconds   INR 1.0 0.8 - 1.2    Comment: (NOTE) INR goal varies based on device and disease  states. Performed at [facility], [address], [place], [place] [address]   APTT     Status: None   Collection Time: 04/09/22  5:42 PM  Result Value Ref Range   aPTT 34 24 - 36 seconds    Comment: Performed at [facility], [address] [place], [place] [address]  CBC     Status: None   Collection Time: 04/09/22  5:42 PM  Result Value Ref Range   WBC 8.8 4.0 - 10.5 K/uL   RBC 4.39 3.87 - 5.11 MIL/uL   Hemoglobin 14.0 12.0 - 15.0 g/dL   HCT 41.7 36.0 - 46.0 %   MCV 95.0 80.0 - 100.0 fL   MCH 31.9 26.0 - 34.0 pg   MCHC 33.6 30.0 - 36.0 g/dL   RDW 12.1 11.5 - 15.5 %   Platelets 249 150 - 400 K/uL   nRBC 0.0 0.0 - 0.2 %    Comment: Performed at [facility], [address] [place], [place] [address]  Differential     Status: None   Collection Time: 04/09/22  5:42 PM  Result Value Ref Range   Neutrophils Relative % 71 %   Neutro Abs 6.3 1.7 - 7.7 K/uL   Lymphocytes Relative 20 %   Lymphs Abs 1.7 0.7 - 4.0 K/uL   Monocytes Relative  6 %   Monocytes Absolute 0.6 0.1 - 1.0 K/uL   Eosinophils Relative 2 %   Eosinophils Absolute 0.2 0.0 - 0.5 K/uL   Basophils Relative 1 %   Basophils Absolute 0.1 0.0 - 0.1 K/uL   Immature Granulocytes 0 %   Abs Immature Granulocytes 0.03 0.00 - 0.07 K/uL    Comment: Performed at [facility], [address], [place], [place] [address]  Comprehensive metabolic panel     Status: Abnormal   Collection Time: 04/09/22  5:42 PM  Result Value Ref Range   Sodium 135 135 - 145 mmol/L   Potassium 4.1 3.5 - 5.1 mmol/L   Chloride 105 98 - 111 mmol/L   CO2 22 22 - 32 mmol/L   Glucose, Bld 92 70 - 99 mg/dL    Comment: Glucose reference range applies only to samples taken after fasting for at least 8 hours.   BUN 32 (H) 8 - 23 mg/dL   Creatinine, Ser 1.05 (H) 0.44 - 1.00 mg/dL   Calcium 9.4 8.9 - 10.3 mg/dL   Total Protein 6.9 6.5 - 8.1 g/dL   Albumin 4.5 3.5 - 5.0 g/dL    AST 20 15 - 41 U/L   ALT 36 0 - 44 U/L   Alkaline Phosphatase 61 38 - 126 U/L   Total Bilirubin 0.7 0.3 - 1.2 mg/dL   GFR, Estimated 59 (L) >60 mL/min    Comment: (NOTE) Calculated using the CKD-EPI Creatinine Equation (2021)    Anion gap 8 5 - 15    Comment: Performed at [facility], [place], [place] [address]  Resp Panel by RT-PCR (Flu A&B, Covid) Anterior Nasal Swab     Status: None   Collection Time: 04/09/22  6:23 PM   Specimen: Anterior Nasal Swab  Result Value Ref Range   SARS Coronavirus 2 by RT PCR NEGATIVE NEGATIVE    Comment: (NOTE) SARS-CoV-2 target nucleic acids are NOT DETECTED.  The SARS-CoV-2 RNA is generally detectable in upper respiratory specimens during the acute phase of infection. The lowest concentration of SARS-CoV-2 viral copies this assay can detect is 138 copies/mL. A negative result does not preclude SARS-Cov-2 infection and should not be used as the sole basis for treatment or other patient management decisions. A negative result may occur with  improper specimen collection/handling, submission of specimen other than nasopharyngeal swab, presence of viral mutation(s) within the areas targeted by this assay, and inadequate number of viral copies(<138 copies/mL). A negative result must be combined with clinical observations, patient history, and epidemiological information. The expected result is Negative.  Fact Sheet for Patients:  EntrepreneurPulse.com.au  Fact Sheet for Healthcare Providers:  IncredibleEmployment.be  This test is no t yet approved or cleared by the [place] FDA and  has been authorized for detection and/or diagnosis of SARS-CoV-2 by FDA under an Emergency Use Authorization (EUA). This EUA will remain  in effect (meaning this test can be used) for the duration of the COVID-19 declaration under Section 564(b)(1) of the Act, 21 U.S.C.section  360bbb-3(b)(1), unless the authorization is terminated  or revoked sooner.       Influenza A by PCR NEGATIVE NEGATIVE   Influenza B by PCR NEGATIVE NEGATIVE    Comment: (NOTE) The Xpert Xpress SARS-CoV-2/FLU/RSV plus assay is intended as an aid in the diagnosis of influenza from Nasopharyngeal swab specimens and should not be used as a sole basis for treatment. Nasal washings and aspirates are unacceptable for Xpert Xpress  SARS-CoV-2/FLU/RSV testing.  Fact Sheet for Patients: EntrepreneurPulse.com.au  Fact Sheet for Healthcare Providers: IncredibleEmployment.be  This test is not yet approved or cleared by the [place] FDA and has been authorized for detection and/or diagnosis of SARS-CoV-2 by FDA under an Emergency Use Authorization (EUA). This EUA will remain in effect (meaning this test can be used) for the duration of the COVID-19 declaration under Section 564(b)(1) of the Act, 21 U.S.C. section 360bbb-3(b)(1), unless the authorization is terminated or revoked.  Performed at [facility], [address], [place], [place] [address]   Urine rapid drug screen (hosp performed)     Status: None   Collection Time: 04/09/22  6:23 PM  Result Value Ref Range   Opiates NONE DETECTED NONE DETECTED   Cocaine NONE DETECTED NONE DETECTED   Benzodiazepines NONE DETECTED NONE DETECTED   Amphetamines NONE DETECTED NONE DETECTED   Tetrahydrocannabinol NONE DETECTED NONE DETECTED   Barbiturates NONE DETECTED NONE DETECTED    Comment: (NOTE) DRUG SCREEN FOR MEDICAL PURPOSES ONLY.  IF CONFIRMATION IS NEEDED FOR ANY PURPOSE, NOTIFY LAB WITHIN 5 DAYS.  LOWEST DETECTABLE LIMITS FOR URINE DRUG SCREEN Drug Class                     Cutoff (ng/mL) Amphetamine and metabolites    1000 Barbiturate and metabolites    200 Benzodiazepine                 008 Tricyclics and metabolites     300 Opiates and metabolites        300 Cocaine  and metabolites        300 THC                            50 Performed at [facility], [place], [place] [address]   Urinalysis, Routine w reflex microscopic Urine, Clean Catch     Status: Abnormal   Collection Time: 04/09/22  6:23 PM  Result Value Ref Range   Color, Urine COLORLESS (A) YELLOW   APPearance CLEAR CLEAR   Specific Gravity, Urine 1.036 (H) 1.005 - 1.030   pH 7.0 5.0 - 8.0   Glucose, UA NEGATIVE NEGATIVE mg/dL   Hgb urine dipstick TRACE (A) NEGATIVE   Bilirubin Urine NEGATIVE NEGATIVE   Ketones, ur NEGATIVE NEGATIVE mg/dL   Protein, ur NEGATIVE NEGATIVE mg/dL   Nitrite NEGATIVE NEGATIVE   Leukocytes,Ua MODERATE (A) NEGATIVE   RBC / HPF 0-5 0 - 5 RBC/hpf   WBC, UA 11-20 0 - 5 WBC/hpf    Comment: Performed at [facility], [address] [place], [place] [address]   MR Brain W and Wo Contrast  Result Date: 04/09/2022 CLINICAL DATA:  Seizure EXAM: MRI HEAD WITHOUT AND WITH CONTRAST TECHNIQUE: Multiplanar, multiecho pulse sequences of the brain and surrounding structures were obtained without and with intravenous contrast. CONTRAST:  44m GADAVIST GADOBUTROL 1 MMOL/ML IV SOLN COMPARISON:  None Available. FINDINGS: Brain: There is a 5.4 x 4.0 cm peripherally contrast-enhancing mass centered in the left frontal lobe with surrounding vasogenic edema. There is approximately 5 mm of rightward midline shift with impending herniation of the left cingulate gyrus. Mass effect on the left lateral ventricle but no hydrocephalus. There are no other contrast-enhancing lesions. Small amount of blood is visible within the mass. No abnormal diffusion restriction. Vascular: Major flow voids are preserved. Skull and upper cervical spine: Normal calvarium and  skull base. Visualized upper cervical spine and soft tissues are normal. Sinuses/Orbits:No paranasal sinus fluid levels or advanced mucosal thickening. No mastoid or middle ear effusion.  Normal orbits. IMPRESSION: 1. A 5.4 x 4.0 cm peripherally contrast-enhancing mass centered in the left frontal lobe with surrounding vasogenic edema, most consistent with a high-grade glioma. 2. Rightward midline shift of 5 mm with impending herniation of the left cingulate gyrus. Electronically Signed   By: Ulyses Jarred M.D.   On: 04/09/2022 21:55   CT ANGIO HEAD NECK W WO CM W PERF (CODE STROKE)  Result Date: 04/09/2022 CLINICAL DATA:  Acute neuro deficit. EXAM: CT ANGIOGRAPHY HEAD AND NECK CT PERFUSION BRAIN TECHNIQUE: Multidetector CT imaging of the head and neck was performed using the standard protocol during bolus administration of intravenous contrast. Multiplanar CT image reconstructions and MIPs were obtained to evaluate the vascular anatomy. Carotid stenosis measurements (when applicable) are obtained utilizing NASCET criteria, using the distal internal carotid diameter as the denominator. Multiphase CT imaging of the brain was performed following IV bolus contrast injection. Subsequent parametric perfusion maps were calculated using RAPID software. RADIATION DOSE REDUCTION: This exam was performed according to the departmental dose-optimization program which includes automated exposure control, adjustment of the mA and/or kV according to patient size and/or use of iterative reconstruction technique. CONTRAST:  114m OMNIPAQUE IOHEXOL 350 MG/ML SOLN COMPARISON:  CT head 04/09/2022 FINDINGS: CTA NECK FINDINGS Aortic arch: Mild atherosclerotic disease aortic arch. Bovine branching pattern. Proximal great vessels patent without stenosis. Right carotid system: Mild atherosclerotic disease right carotid bifurcation. Negative for stenosis. Left carotid system: Mild atherosclerotic calcification left carotid bifurcation. No significant carotid stenosis. Vertebral arteries: Both vertebral arteries widely patent to the skull base without stenosis. Skeleton: Negative Other neck: 8 mm right thyroid nodule. 6.5  mm left thyroid nodule. No further imaging necessary. (Ref: J Am Coll Radiol. 2015 Feb;12(2): 143-50). Upper chest: Lung apices clear bilaterally Review of the MIP images confirms the above findings CTA HEAD FINDINGS Anterior circulation: Internal carotid artery widely patent through the skull base and cavernous segments. Anterior and middle cerebral arteries widely patent without stenosis or large vessel occlusion. Negative for cerebral aneurysm. Posterior circulation: Both vertebral arteries patent to the basilar. PICA patent bilaterally. Basilar widely patent. Superior cerebellar and posterior cerebral arteries patent. No large vessel occlusion or aneurysm Venous sinuses: Normal venous enhancement Anatomic variants: None Review of the MIP images confirms the above findings CT Brain Perfusion Findings: ASPECTS: Not obtained due to mass lesion left frontal lobe CBF (<30%) Volume: 068mPerfusion (Tmax>6.0s) volume: 15m56mismatch Volume: 15mL23mfarction Location:None IMPRESSION: 1. Mass lesion left frontal lobe shows peripheral enhancement. There is surrounding edema and mass-effect. This is most likely neoplasm. Favor glioblastoma. Recommend MRI head without and with contrast. 2. Mild atherosclerotic disease in the carotid bifurcation bilaterally. No significant carotid or vertebral artery stenosis 3. No intracranial stenosis or large vessel occlusion. Electronically Signed   By: CharFranchot Gallo.   On: 04/09/2022 18:25   CT HEAD CODE STROKE WO CONTRAST  Result Date: 04/09/2022 CLINICAL DATA:  Code stroke.  Acute neuro deficit. EXAM: CT HEAD WITHOUT CONTRAST TECHNIQUE: Contiguous axial images were obtained from the base of the skull through the vertex without intravenous contrast. RADIATION DOSE REDUCTION: This exam was performed according to the departmental dose-optimization program which includes automated exposure control, adjustment of the mA and/or kV according to patient size and/or use of iterative  reconstruction technique. COMPARISON:  None Available. FINDINGS: Brain: Large mass lesion  left frontal lobe. Central low density mass measures approximately 4.4 x 3.2 cm. Likely associated necrosis. There is surrounding hypodensity around the mass. No associated hemorrhage. There is local mass-effect on the left frontal horn and mild midline shift to the right of approximately 3 mm. This mass shows peripheral enhancement following contrast infusion on the CT angiogram performed today. No second mass lesion identified.  No acute hemorrhage. Vascular: Negative for hyperdense vessel Skull: Negative Sinuses/Orbits: Paranasal sinuses clear.  Negative orbit Other: None ASPECTS ([employer] [place] Stroke Program Early CT Score) Not applicable due to mass lesion. IMPRESSION: 1. Large low-density mass in the left frontal lobe with surrounding white matter hypodensity. Irregular peripheral enhancement following contrast infusion on CT angiogram. This is most compatible with malignancy. Favor glioblastoma. Metastatic disease also a consideration. Local mass-effect and mild midline shift. 2. MRI brain without with contrast recommended for further evaluation. 3. These results were called by telephone at the time of interpretation on 04/09/2022 at 6:11 pm to provider DAVID YAO , who verbally acknowledged these results. Electronically Signed   By: Franchot Gallo M.D.   On: 04/09/2022 18:12    Review of Systems  Constitutional: Negative.   HENT: Negative.    Eyes: Negative.   Respiratory: Negative.    Cardiovascular: Negative.   Gastrointestinal: Negative.   Endocrine: Negative.   Genitourinary: Negative.   Musculoskeletal: Negative.   Skin: Negative.   Neurological:  Positive for headaches.  Hematological: Negative.   Psychiatric/Behavioral: Negative.      Blood pressure 122/71, pulse 71, temperature 97.9 F (36.6 C), temperature source Oral, resp. rate 15, SpO2 100 %. Physical Exam Constitutional:      General: She is  not in acute distress.    Appearance: Normal appearance. She is normal weight. She is not ill-appearing.  HENT:     Head: Normocephalic and atraumatic.     Right Ear: External ear normal.     Left Ear: External ear normal.     Nose: Nose normal.     Mouth/Throat:     Mouth: Mucous membranes are moist.     Pharynx: Oropharynx is clear.  Eyes:     Extraocular Movements: Extraocular movements intact.     Conjunctiva/sclera: Conjunctivae normal.     Pupils: Pupils are equal, round, and reactive to light.  Cardiovascular:     Rate and Rhythm: Normal rate and regular rhythm.  Pulmonary:     Effort: Pulmonary effort is normal.  Abdominal:     General: Abdomen is flat.     Palpations: Abdomen is soft.  Musculoskeletal:        General: Normal range of motion.     Cervical back: Normal range of motion.  Skin:    General: Skin is warm and dry.  Neurological:     Mental Status: She is alert and oriented to person, place, and time.     Motor: Motor function is intact.     Coordination: Coordination is intact.     Deep Tendon Reflexes: Babinski sign absent on the right side. Babinski sign absent on the left side.     Comments: Some perseveration, word finding difficulties. Aphasic more receptive than expressive. Will follow some simple commands. No drift Sensation intact, difficult to perform detailed exam due to language deficits.       Assessment/Plan DONNEISHA BEANE is a 66 y.o. female With a ring enhancing mass in left frontal lobe. Most likely a glioma. Will admit, continue decadron, and keppra. Possible resection this Friday.  Ashok Pall, MD 04/09/2022, 11:14 PM

## 2022-04-09 NOTE — ED Notes (Signed)
ED Provider at bedside. 

## 2022-04-09 NOTE — ED Triage Notes (Signed)
Her son is with her. She is only able to repeatedly say to me "I don't know-I don't know". Her son states he saw her at 0730 this morning, at which time "she was walking her dog and was perfectly normal". He then states "she had a friend come over to her house and found her to 'not be right, so she called me'".

## 2022-04-09 NOTE — ED Notes (Signed)
Patient transported to MRI 

## 2022-04-09 NOTE — ED Provider Notes (Signed)
[facility] EMERGENCY DEPT Provider Note   CSN: [id number] Arrival date & time: 04/09/22  1730     History  Chief Complaint  Patient presents with   Code Stroke    Jennifer [name] is a 66 y.o. female history of hypertension, here presenting with confusion and slurred speech.  Patient was at baseline around 7:30 AM.  Son usually comes and visits her and came to see her around 7:30 AM before he went to work.  Her daughter was with her and noticed that she was confused.  She seemed to have some expressive aphasia and was very forgetful.  He called around 4:30 PM and noticed that she was very confused and had slurred speech.  He brought her here for further evaluation.  The history is provided by the patient.       Home Medications Prior to Admission medications   Medication Sig Start Date End Date Taking? Authorizing Provider  amLODipine (NORVASC) 5 MG tablet TAKE 1 TABLET BY MOUTH EVERY DAY 06/06/19   Wendie Agreste, MD  atorvastatin (LIPITOR) 10 MG tablet TAKE 1 TABLET BY MOUTH EVERY DAY 11/10/19   Wendie Agreste, MD  busPIRone (BUSPAR) 5 MG tablet TAKE 1 TABLET BY MOUTH THREE TIMES A DAY 08/16/19   Wendie Agreste, MD  fluticasone [facility]) 50 MCG/ACT nasal spray SPRAY 2 SPRAYS INTO EACH NOSTRIL EVERY DAY 09/21/19   Wendie Agreste, MD  losartan (COZAAR) 50 MG tablet TAKE 1 TABLET BY MOUTH EVERY DAY 04/28/19   Wendie Agreste, MD  meloxicam (MOBIC) 7.5 MG tablet TAKE 1-2 TABLETS (7.5-15 MG TOTAL) BY MOUTH DAILY. 08/15/19   Wendie Agreste, MD  Omega-3 Fatty Acids (OMEGA-3 FISH OIL PO) Take 1 capsule by mouth daily at 6 (six) AM.    [provider]  oxybutynin (DITROPAN-XL) 5 MG 24 hr tablet TAKE 1 TABLET BY MOUTH EVERYDAY AT BEDTIME 10/01/19   Wendie Agreste, MD  topiramate (TOPAMAX) 50 MG tablet TAKE 1 TABLET BY MOUTH TWICE A DAY 12/26/19   Wendie Agreste, MD  traZODone (DESYREL) 50 MG tablet TAKE 0.5-1 TABLETS (25-50 MG TOTAL) BY MOUTH AT BEDTIME  AS NEEDED FOR SLEEP. 11/26/19   Wendie Agreste, MD      Allergies    Patient has no known allergies.    Review of Systems   Review of Systems  Neurological:  Positive for speech difficulty.  Psychiatric/Behavioral:  Positive for confusion.   All other systems reviewed and are negative.   Physical Exam Updated Vital Signs BP 136/75 (BP Location: Right Arm)   Pulse 69   Temp 97.7 F (36.5 C) (Oral)   Resp 16   SpO2 100%  Physical Exam Vitals and nursing note reviewed.  Constitutional:      Appearance: Normal appearance.  HENT:     Head: Normocephalic.     Mouth/Throat:     Mouth: Mucous membranes are moist.  Eyes:     Extraocular Movements: Extraocular movements intact.     Pupils: Pupils are equal, round, and reactive to light.  Cardiovascular:     Rate and Rhythm: Normal rate and regular rhythm.     Pulses: Normal pulses.     Heart sounds: Normal heart sounds.  Pulmonary:     Effort: Pulmonary effort is normal.     Breath sounds: Normal breath sounds.  Abdominal:     General: Abdomen is flat.     Palpations: Abdomen is soft.  Musculoskeletal:  General: Normal range of motion.     Cervical back: Normal range of motion and neck supple.  Skin:    General: Skin is warm.     Capillary Refill: Capillary refill takes less than 2 seconds.  Neurological:     Mental Status: She is alert.     Comments: Patient has mild right facial droop.  Strength is 4 out of 5 on the right arm and leg and 5 out of 5 left arm and leg.  Patient does have some expressive aphasia.  Psychiatric:        Mood and Affect: Mood normal.        Behavior: Behavior normal.     ED Results / Procedures / Treatments   Labs (all labs ordered are listed, but only abnormal results are displayed) Labs Reviewed  COMPREHENSIVE METABOLIC PANEL - Abnormal; Notable for the following components:      Result Value   BUN 32 (*)    Creatinine, Ser 1.05 (*)    GFR, Estimated 59 (*)    All other  components within normal limits  RESP PANEL BY RT-PCR (FLU A&B, COVID) ARPGX2  ETHANOL  PROTIME-INR  APTT  CBC  DIFFERENTIAL  RAPID URINE DRUG SCREEN, HOSP PERFORMED  URINALYSIS, ROUTINE W REFLEX MICROSCOPIC  CBG MONITORING, ED    EKG None  Radiology No results found.  Procedures Procedures     CRITICAL CARE Performed by: Wandra Arthurs   Total critical care time: 30 minutes  Critical care time was exclusive of separately billable procedures and treating other patients.  Critical care was necessary to treat or prevent imminent or life-threatening deterioration.  Critical care was time spent personally by me on the following activities: development of treatment plan with patient and/or surrogate as well as nursing, discussions with consultants, evaluation of patient's response to treatment, examination of patient, obtaining history from patient or surrogate, ordering and performing treatments and interventions, ordering and review of laboratory studies, ordering and review of radiographic studies, pulse oximetry and re-evaluation of patient's condition.  Medications Ordered in ED Medications  iohexol (OMNIPAQUE) 350 MG/ML injection 100 mL (100 mLs Intravenous Contrast Given 04/09/22 1806)    ED Course/ Medical Decision Making/ A&P                           Medical Decision Making Jennifer [name] is a 66 y.o. female here presenting with confusion and some expressive aphasia.  Patient's last normal was 7:30 AM when son came to visit her.  We will have him call patient's daughter to get a closer timeline.  Code stroke activated given that she has expressive aphasia and right-sided weakness and right facial droop.  Teleneurology was consulted as well.  6:17 PM CT showed left frontal mass.  This is likely causing her symptoms.  She is not a tPA candidate since patient has a mass.  I discussed with Dr. Quinn Axe from neurology.  She recommend MRI brain with and without contrast. She  also recommend loading with Keppra (20 mg/kg).  Since there are no beds in the hospital, patient will be transferred to the ED at [facility] to expedite the MRI.  Anticipate that patient will be admitted afterwards.  Dr. Quinn Axe will make the neurologist at [facility] aware of the patient as well. Dr. Maryan Rued accepting doctor    Problems Addressed: Brain mass: acute illness or injury  Amount and/or Complexity of Data Reviewed Labs: ordered. Decision-making details documented  in ED Course. Radiology: ordered and independent interpretation performed. Decision-making details documented in ED Course.  Risk Prescription drug management. Decision regarding hospitalization.    Final Clinical Impression(s) / ED Diagnoses Final diagnoses:  None    Rx / DC Orders ED Discharge Orders     None         Drenda Freeze, MD 04/09/22 [id number]

## 2022-04-09 NOTE — ED Notes (Signed)
Will get vitals once patient returns to room

## 2022-04-09 NOTE — ED Provider Notes (Signed)
  Physical Exam  BP (!) 141/98   Pulse 72   Temp 97.7 F (36.5 C) (Oral)   Resp 15   SpO2 99%     Procedures  Procedures  ED Course / MDM    Medical Decision Making Amount and/or Complexity of Data Reviewed Labs: ordered. Radiology: ordered.  Risk Prescription drug management. Decision regarding hospitalization.   Received patient in signout from med [facility].  Patient initially presented as a code stroke due to confusion, some expressive aphasia and forgetfulness noted with mild right facial droop noted on exam.

## 2022-04-09 NOTE — Consult Note (Addendum)
Code stroke activated at 3.  Neuro paged at 1739.  Left for CT at 1744.  Dr Quinn Axe on camera for neuro exam at 1745. Stroke cart in CT with pt.   NIH exam by Dr Quinn Axe at [phone] in [place].   Returned from [place] at [employer].   TSRN off camera at 1815 by dismissal from neurologist.   -Biagio Borg, [place] RN

## 2022-04-10 ENCOUNTER — Ambulatory Visit (HOSPITAL_COMMUNITY): Payer: Medicare Other

## 2022-04-10 ENCOUNTER — Other Ambulatory Visit: Payer: Self-pay | Admitting: Neurosurgery

## 2022-04-10 ENCOUNTER — Inpatient Hospital Stay (HOSPITAL_COMMUNITY): Payer: Medicare Other

## 2022-04-10 LAB — CBC
HCT: 38.5 % (ref 36.0–46.0)
Hemoglobin: 12.9 g/dL (ref 12.0–15.0)
MCH: 31.8 pg (ref 26.0–34.0)
MCHC: 33.5 g/dL (ref 30.0–36.0)
MCV: 94.8 fL (ref 80.0–100.0)
Platelets: 212 10*3/uL (ref 150–400)
RBC: 4.06 MIL/uL (ref 3.87–5.11)
RDW: 11.9 % (ref 11.5–15.5)
WBC: 7.3 10*3/uL (ref 4.0–10.5)
nRBC: 0 % (ref 0.0–0.2)

## 2022-04-10 LAB — TYPE AND SCREEN
ABO/RH(D): O POS
Antibody Screen: NEGATIVE

## 2022-04-10 LAB — ABO/RH: ABO/RH(D): O POS

## 2022-04-10 LAB — HIV ANTIBODY (ROUTINE TESTING W REFLEX): HIV Screen 4th Generation wRfx: NONREACTIVE

## 2022-04-10 LAB — CREATININE, SERUM
Creatinine, Ser: 0.99 mg/dL (ref 0.44–1.00)
GFR, Estimated: >60 mL/min (ref >60)

## 2022-04-10 LAB — MRSA NEXT GEN BY PCR, NASAL: MRSA by PCR Next Gen: NOT DETECTED

## 2022-04-10 MED ORDER — CHLORHEXIDINE GLUCONATE CLOTH 2 % EX PADS
6.0000 | MEDICATED_PAD | Freq: Every day | CUTANEOUS | Status: DC
  Administered 2022-04-10 – 2022-04-15 (×4): 6 via TOPICAL

## 2022-04-10 MED ORDER — IOHEXOL 300 MG/ML  SOLN
100.0000 mL | Freq: Once | INTRAMUSCULAR | Status: AC | PRN
  Administered 2022-04-10: 100 mL via INTRAVENOUS

## 2022-04-10 MED ORDER — ORAL CARE MOUTH RINSE: 15.0000 mL | OROMUCOSAL | Status: DC | PRN

## 2022-04-10 MED ORDER — CEFAZOLIN SODIUM-DEXTROSE 2-4 GM/100ML-% IV SOLN
2.0000 g | INTRAVENOUS | Status: AC
  Administered 2022-04-11: 2 g via INTRAVENOUS
  Filled 2022-04-10: qty 100

## 2022-04-10 MED ORDER — CHLORHEXIDINE GLUCONATE CLOTH 2 % EX PADS
6.0000 | MEDICATED_PAD | Freq: Once | CUTANEOUS | Status: AC
  Administered 2022-04-10: 6 via TOPICAL

## 2022-04-10 MED ORDER — CHLORHEXIDINE GLUCONATE CLOTH 2 % EX PADS
6.0000 | MEDICATED_PAD | Freq: Once | CUTANEOUS | Status: AC
  Administered 2022-04-11: 6 via TOPICAL

## 2022-04-10 NOTE — Plan of Care (Addendum)
Patient discussed with Dr. Christella Noa, who is planning possible resection tomorrow for patient. Defer steroid dosing to neurosurgery. Keppra maintenance dose ordered. Dr. Mickeal Skinner should be contacted in the morning to help arrange neuro-oncological follow-up.   Lesleigh Noe MD-PhD [facility] [phone] No charge note

## 2022-04-10 NOTE — Progress Notes (Signed)
Called to receive report, informed nurse currently in CT with another patient

## 2022-04-10 NOTE — TOC CM/SW Note (Signed)
Family requesting information on Advanced Directives/ Living Will.  Patient/family given Advanced Directives packet, and consult placed to Chaplain for assistance with Living Will/Notary.     Reinaldo Raddle, RN, BSN  Trauma/Neuro ICU Case Manager [phone]

## 2022-04-10 NOTE — ED Notes (Signed)
Patient transferred to bedside commode with minimal assistance

## 2022-04-10 NOTE — Progress Notes (Signed)
   04/10/22 1650  Clinical Encounter Type  Visited With Patient and family together  Visit Type Follow-up  Referral From Nurse  Consult/Referral To Chaplain   Met with patient and family members. We discussed the premise of the advanced directive. I encouraged the the patient, Jennifer [name] to take her time as she completed the forms reflecting on what she would like her care to look like. The advanced directive allows the medical team to provide the care a patient wants at a point in life when she may not be able to express her desires.  The family plans to complete the forms tonight and I will review them tomorrow and schedule the notary at that point.   Danice Goltz [facility]  [phone]

## 2022-04-10 NOTE — Progress Notes (Signed)
   04/10/22 1320  Clinical Encounter Type  Visited With Patient  Visit Type Initial;Other (Comment) (Advanced Directive)  Referral From Nurse  Consult/Referral To Chaplain   Chaplain responded to a spiritual consult request for advanced directive education  I left the paperwork with the patient, Jennifer [name], she did not feel up to going over the paperwork at this time.  I will return tomorrow and go over it with her.   Danice Goltz [facility]  [phone]

## 2022-04-10 NOTE — Progress Notes (Signed)
Patient ID: Jennifer [name], female   DOB: [birth year], 66 y.o.   MRN: [id number] BP 119/66   Pulse 68   Temp 97.7 F (36.5 C) (Oral)   Resp 15   Ht '5\' 2"'$  (1.575 m)   Wt 68.3 kg   SpO2 94%   BMI 27.54 kg/m  Alert, mild expressive aphasia, perseveration  Following commands Perrl, full eom OR tomorrow for craniotomy

## 2022-04-11 ENCOUNTER — Encounter (HOSPITAL_COMMUNITY): Payer: Self-pay | Admitting: Neurosurgery

## 2022-04-11 ENCOUNTER — Inpatient Hospital Stay (HOSPITAL_COMMUNITY): Payer: Medicare Other | Admitting: Anesthesiology

## 2022-04-11 ENCOUNTER — Other Ambulatory Visit: Payer: Self-pay

## 2022-04-11 ENCOUNTER — Encounter (HOSPITAL_COMMUNITY)
Admission: EM | Disposition: A | Discharge status: Home / Self Care | Payer: Self-pay | Source: Home / Self Care | Attending: Neurosurgery

## 2022-04-11 DIAGNOSIS — D496 Neoplasm of unspecified behavior of brain: Secondary | ICD-10-CM

## 2022-04-11 DIAGNOSIS — I10 Essential (primary) hypertension: Secondary | ICD-10-CM

## 2022-04-11 HISTORY — PX: CRANIOTOMY: SHX93

## 2022-04-11 HISTORY — PX: APPLICATION OF CRANIAL NAVIGATION: SHX6578

## 2022-04-11 SURGERY — CRANIOTOMY TUMOR EXCISION
Anesthesia: General

## 2022-04-11 MED ORDER — FENTANYL CITRATE (PF) 250 MCG/5ML IJ SOLN
INTRAMUSCULAR | Status: AC
  Filled 2022-04-11: qty 5

## 2022-04-11 MED ORDER — THROMBIN 20000 UNITS EX SOLR
CUTANEOUS | Status: DC | PRN
  Administered 2022-04-11: 20 mL via TOPICAL

## 2022-04-11 MED ORDER — ACETAMINOPHEN 10 MG/ML IV SOLN: 1000.0000 mg | Freq: Once | INTRAVENOUS | Status: DC | PRN

## 2022-04-11 MED ORDER — PHENYLEPHRINE 80 MCG/ML (10ML) SYRINGE FOR IV PUSH (FOR BLOOD PRESSURE SUPPORT)
PREFILLED_SYRINGE | INTRAVENOUS | Status: AC
  Filled 2022-04-11: qty 10

## 2022-04-11 MED ORDER — LACTATED RINGERS IV SOLN: INTRAVENOUS | Status: DC

## 2022-04-11 MED ORDER — ORAL CARE MOUTH RINSE: 15.0000 mL | Freq: Once | OROMUCOSAL | Status: AC

## 2022-04-11 MED ORDER — SUCCINYLCHOLINE CHLORIDE 200 MG/10ML IV SOSY
PREFILLED_SYRINGE | INTRAVENOUS | Status: AC
  Filled 2022-04-11: qty 10

## 2022-04-11 MED ORDER — DEXAMETHASONE SODIUM PHOSPHATE 10 MG/ML IJ SOLN
INTRAMUSCULAR | Status: DC | PRN
  Administered 2022-04-11: 10 mg via INTRAVENOUS

## 2022-04-11 MED ORDER — ONDANSETRON HCL 4 MG/2ML IJ SOLN
INTRAMUSCULAR | Status: AC
  Filled 2022-04-11: qty 2

## 2022-04-11 MED ORDER — HEMOSTATIC AGENTS (NO CHARGE) OPTIME
TOPICAL | Status: DC | PRN
  Administered 2022-04-11 (×2): 1 via TOPICAL

## 2022-04-11 MED ORDER — THROMBIN 5000 UNITS EX SOLR: OROMUCOSAL | Status: DC | PRN

## 2022-04-11 MED ORDER — FENTANYL CITRATE (PF) 100 MCG/2ML IJ SOLN: 25.0000 ug | INTRAMUSCULAR | Status: DC | PRN

## 2022-04-11 MED ORDER — SUGAMMADEX SODIUM 200 MG/2ML IV SOLN
INTRAVENOUS | Status: DC | PRN
  Administered 2022-04-11: 200 mg via INTRAVENOUS

## 2022-04-11 MED ORDER — LABETALOL HCL 5 MG/ML IV SOLN: 5.0000 mg | INTRAVENOUS | Status: DC | PRN

## 2022-04-11 MED ORDER — LIDOCAINE 2% (20 MG/ML) 5 ML SYRINGE
INTRAMUSCULAR | Status: DC | PRN
  Administered 2022-04-11: 60 mg via INTRAVENOUS

## 2022-04-11 MED ORDER — THROMBIN 20000 UNITS EX SOLR
CUTANEOUS | Status: AC
  Filled 2022-04-11: qty 20000

## 2022-04-11 MED ORDER — MIDAZOLAM HCL 2 MG/2ML IJ SOLN
INTRAMUSCULAR | Status: AC
  Filled 2022-04-11: qty 2

## 2022-04-11 MED ORDER — ONDANSETRON HCL 4 MG/2ML IJ SOLN
INTRAMUSCULAR | Status: DC | PRN
  Administered 2022-04-11: 4 mg via INTRAVENOUS

## 2022-04-11 MED ORDER — CHLORHEXIDINE GLUCONATE 0.12 % MT SOLN
15.0000 mL | Freq: Once | OROMUCOSAL | Status: AC
  Administered 2022-04-11: 15 mL via OROMUCOSAL
  Filled 2022-04-11: qty 15

## 2022-04-11 MED ORDER — THROMBIN 5000 UNITS EX SOLR
CUTANEOUS | Status: AC
  Filled 2022-04-11: qty 5000

## 2022-04-11 MED ORDER — MANNITOL 25 % IV SOLN
INTRAVENOUS | Status: DC | PRN
  Administered 2022-04-11: 37.5 g via INTRAVENOUS

## 2022-04-11 MED ORDER — DEXAMETHASONE SODIUM PHOSPHATE 10 MG/ML IJ SOLN
INTRAMUSCULAR | Status: AC
  Filled 2022-04-11: qty 1

## 2022-04-11 MED ORDER — FENTANYL CITRATE (PF) 250 MCG/5ML IJ SOLN
INTRAMUSCULAR | Status: DC | PRN
  Administered 2022-04-11: 150 ug via INTRAVENOUS
  Administered 2022-04-11: 50 ug via INTRAVENOUS
  Administered 2022-04-11: 100 ug via INTRAVENOUS

## 2022-04-11 MED ORDER — BACITRACIN ZINC 500 UNIT/GM EX OINT
TOPICAL_OINTMENT | CUTANEOUS | Status: DC | PRN
  Administered 2022-04-11: 1 via TOPICAL

## 2022-04-11 MED ORDER — ROCURONIUM BROMIDE 10 MG/ML (PF) SYRINGE
PREFILLED_SYRINGE | INTRAVENOUS | Status: AC
  Filled 2022-04-11: qty 10

## 2022-04-11 MED ORDER — PROPOFOL 10 MG/ML IV BOLUS
INTRAVENOUS | Status: AC
  Filled 2022-04-11: qty 20

## 2022-04-11 MED ORDER — PHENYLEPHRINE HCL-NACL 20-0.9 MG/250ML-% IV SOLN
INTRAVENOUS | Status: DC | PRN
  Administered 2022-04-11: 50 ug/min via INTRAVENOUS

## 2022-04-11 MED ORDER — LIDOCAINE 2% (20 MG/ML) 5 ML SYRINGE
INTRAMUSCULAR | Status: AC
  Filled 2022-04-11: qty 5

## 2022-04-11 MED ORDER — LIDOCAINE-EPINEPHRINE 0.5 %-1:200000 IJ SOLN
INTRAMUSCULAR | Status: DC | PRN
  Administered 2022-04-11: 16 mL

## 2022-04-11 MED ORDER — SODIUM CHLORIDE 0.9 % IV SOLN: INTRAVENOUS | Status: DC

## 2022-04-11 MED ORDER — MIDAZOLAM HCL 2 MG/2ML IJ SOLN
INTRAMUSCULAR | Status: DC | PRN
  Administered 2022-04-11: 2 mg via INTRAVENOUS

## 2022-04-11 MED ORDER — MICROFIBRILLAR COLL HEMOSTAT EX POWD
CUTANEOUS | Status: AC
  Filled 2022-04-11: qty 5

## 2022-04-11 MED ORDER — 0.9 % SODIUM CHLORIDE (POUR BTL) OPTIME
TOPICAL | Status: DC | PRN
  Administered 2022-04-11: 3000 mL

## 2022-04-11 MED ORDER — PROPOFOL 10 MG/ML IV BOLUS
INTRAVENOUS | Status: DC | PRN
  Administered 2022-04-11: 140 mg via INTRAVENOUS

## 2022-04-11 MED ORDER — ROCURONIUM BROMIDE 10 MG/ML (PF) SYRINGE
PREFILLED_SYRINGE | INTRAVENOUS | Status: DC | PRN
  Administered 2022-04-11: 60 mg via INTRAVENOUS
  Administered 2022-04-11: 40 mg via INTRAVENOUS

## 2022-04-11 MED ORDER — BACITRACIN ZINC 500 UNIT/GM EX OINT
TOPICAL_OINTMENT | CUTANEOUS | Status: AC
  Filled 2022-04-11: qty 28.35

## 2022-04-11 MED ORDER — LIDOCAINE-EPINEPHRINE 0.5 %-1:200000 IJ SOLN
INTRAMUSCULAR | Status: AC
  Filled 2022-04-11: qty 1

## 2022-04-11 SURGICAL SUPPLY — 92 items
BAG COUNTER SPONGE SURGICOUNT (BAG) ×2 IMPLANT
BAND RUBBER #18 3X1/16 STRL (MISCELLANEOUS) IMPLANT
BENZOIN TINCTURE PRP APPL 2/3 (GAUZE/BANDAGES/DRESSINGS) IMPLANT
BLADE CLIPPER SURG (BLADE) ×2 IMPLANT
BLADE SAW GIGLI 16 STRL (MISCELLANEOUS) IMPLANT
BLADE SURG 15 STRL LF DISP TIS (BLADE) IMPLANT
BLADE SURG 15 STRL SS (BLADE)
BLADE ULTRA TIP 2M (BLADE) IMPLANT
BNDG GAUZE ELAST 4 BULKY (GAUZE/BANDAGES/DRESSINGS) IMPLANT
BUR ACORN 6.0 PRECISION (BURR) ×2 IMPLANT
BUR MATCHSTICK NEURO 3.0 LAGG (BURR) IMPLANT
BUR SPIRAL ROUTER 2.3 (BUR) IMPLANT
CANISTER SUCT 3000ML PPV (MISCELLANEOUS) ×2 IMPLANT
CARTRIDGE OIL MAESTRO DRILL (MISCELLANEOUS) ×2 IMPLANT
CATH VENTRIC 35X38 W/TROCAR LG (CATHETERS) IMPLANT
CLIP VESOCCLUDE MED 6/CT (CLIP) IMPLANT
CNTNR URN SCR LID CUP LEK RST (MISCELLANEOUS) ×2 IMPLANT
CONT SPEC 4OZ STRL OR WHT (MISCELLANEOUS) ×2
COVER BURR HOLE 7 (Orthopedic Implant) IMPLANT
DIFFUSER DRILL AIR PNEUMATIC (MISCELLANEOUS) ×2 IMPLANT
DRAIN SUBARACHNOID (WOUND CARE) IMPLANT
DRAPE CAMERA VIDEO/LASER (DRAPES) IMPLANT
DRAPE MICROSCOPE LEICA (MISCELLANEOUS) IMPLANT
DRAPE NEUROLOGICAL W/INCISE (DRAPES) ×2 IMPLANT
DRAPE ORTHO SPLIT 77X108 STRL (DRAPES)
DRAPE POUCH INSTRU U-SHP 10X18 (DRAPES) IMPLANT
DRAPE SURG 17X23 STRL (DRAPES) IMPLANT
DRAPE SURG ORHT 6 SPLT 77X108 (DRAPES) IMPLANT
DRAPE WARM FLUID 44X44 (DRAPES) ×2 IMPLANT
DRSG TELFA 3X8 NADH (GAUZE/BANDAGES/DRESSINGS) ×2 IMPLANT
DRSG TELFA 3X8 NADH STRL (GAUZE/BANDAGES/DRESSINGS) IMPLANT
DURAPREP 6ML APPLICATOR 50/CS (WOUND CARE) ×2 IMPLANT
ELECT REM PT RETURN 9FT ADLT (ELECTROSURGICAL) ×2 IMPLANT
ELECTRODE REM PT RTRN 9FT ADLT (ELECTROSURGICAL) ×2 IMPLANT
EVACUATOR 1/8 PVC DRAIN (DRAIN) IMPLANT
EVACUATOR SILICONE 100CC (DRAIN) IMPLANT
GAUZE 4X4 16PLY ~~LOC~~+RFID DBL (SPONGE) IMPLANT
GAUZE SPONGE 4X4 12PLY STRL (GAUZE/BANDAGES/DRESSINGS) ×2 IMPLANT
GLOVE ECLIPSE 6.5 STRL STRAW (GLOVE) ×2 IMPLANT
GLOVE EXAM NITRILE XL STR (GLOVE) IMPLANT
GOWN STRL REUS W/ TWL LRG LVL3 (GOWN DISPOSABLE) ×4 IMPLANT
GOWN STRL REUS W/ TWL XL LVL3 (GOWN DISPOSABLE) IMPLANT
GOWN STRL REUS W/TWL 2XL LVL3 (GOWN DISPOSABLE) IMPLANT
GOWN STRL REUS W/TWL LRG LVL3 (GOWN DISPOSABLE) ×4
GOWN STRL REUS W/TWL XL LVL3 (GOWN DISPOSABLE)
GRAFT DURAGEN MATRIX 1WX1L (Tissue) IMPLANT
HEMOSTAT POWDER KIT SURGIFOAM (HEMOSTASIS) IMPLANT
HEMOSTAT SURGICEL .5X2 ABSORB (HEMOSTASIS) IMPLANT
HEMOSTAT SURGICEL 2X14 (HEMOSTASIS) IMPLANT
HOOK DURA 1/2IN (MISCELLANEOUS) ×2 IMPLANT
KIT BASIN OR (CUSTOM PROCEDURE TRAY) ×2 IMPLANT
KIT DRAIN CSF ACCUDRAIN (MISCELLANEOUS) IMPLANT
KIT TURNOVER KIT B (KITS) ×2 IMPLANT
Kerlix gauze bandage IMPLANT
MARKER SPHERE PSV REFLC 13MM (MARKER) ×4 IMPLANT
NDL HYPO 25X1 1.5 SAFETY (NEEDLE) ×2 IMPLANT
NDL SPNL 18GX3.5 QUINCKE PK (NEEDLE) IMPLANT
NEEDLE HYPO 25X1 1.5 SAFETY (NEEDLE) ×2 IMPLANT
NEEDLE SPNL 18GX3.5 QUINCKE PK (NEEDLE) IMPLANT
NS IRRIG 1000ML POUR BTL (IV SOLUTION) ×6 IMPLANT
OIL CARTRIDGE MAESTRO DRILL (MISCELLANEOUS) ×2 IMPLANT
PACK CRANIOTOMY CUSTOM (CUSTOM PROCEDURE TRAY) ×2 IMPLANT
PAD DRESSING TELFA 3X8 NADH (GAUZE/BANDAGES/DRESSINGS) IMPLANT
PATTIES SURGICAL .25X.25 (GAUZE/BANDAGES/DRESSINGS) IMPLANT
PATTIES SURGICAL .5 X.5 (GAUZE/BANDAGES/DRESSINGS) IMPLANT
PATTIES SURGICAL .5 X3 (DISPOSABLE) IMPLANT
PATTIES SURGICAL 1/4 X 3 (GAUZE/BANDAGES/DRESSINGS) IMPLANT
PATTIES SURGICAL 1X1 (DISPOSABLE) IMPLANT
PIN MAYFIELD SKULL DISP (PIN) IMPLANT
PLATE CRANIAL 12 2H RIGID UNI (Plate) IMPLANT
SCREW UNIII AXS SD 1.5X4 (Screw) IMPLANT
SPECIMEN JAR SMALL (MISCELLANEOUS) IMPLANT
SPIKE FLUID TRANSFER (MISCELLANEOUS) ×2 IMPLANT
SPONGE NEURO XRAY DETECT 1X3 (DISPOSABLE) IMPLANT
SPONGE SURGIFOAM ABS GEL 100 (HEMOSTASIS) ×2 IMPLANT
STAPLER VISISTAT 35W (STAPLE) ×2 IMPLANT
STOCKINETTE 6  STRL (DRAPES) ×2
STOCKINETTE 6 STRL (DRAPES) IMPLANT
SUT ETHILON 3 0 FSL (SUTURE) IMPLANT
SUT ETHILON 3 0 PS 1 (SUTURE) IMPLANT
SUT NURALON 4 0 TR CR/8 (SUTURE) ×6 IMPLANT
SUT SILK 0 TIES 10X30 (SUTURE) IMPLANT
SUT STEEL 0 (SUTURE)
SUT STEEL 0 18XMFL TIE 17 (SUTURE) IMPLANT
SUT VIC AB 2-0 CT2 18 VCP726D (SUTURE) ×4 IMPLANT
TAPE HYPAFIX 4 X10 (GAUZE/BANDAGES/DRESSINGS) IMPLANT
TOWEL GREEN STERILE (TOWEL DISPOSABLE) ×2 IMPLANT
TOWEL GREEN STERILE FF (TOWEL DISPOSABLE) ×2 IMPLANT
TRAY FOLEY MTR SLVR 16FR STAT (SET/KITS/TRAYS/PACK) ×2 IMPLANT
TUBE CONNECTING 12X1/4 (SUCTIONS) ×2 IMPLANT
UNDERPAD 30X36 HEAVY ABSORB (UNDERPADS AND DIAPERS) ×2 IMPLANT
WATER STERILE IRR 1000ML POUR (IV SOLUTION) ×2 IMPLANT

## 2022-04-11 NOTE — Progress Notes (Signed)
   04/11/22 1230  Clinical Encounter Type  Visited With Patient  Visit Type Follow-up;Other (Comment) (Advanced Directive)  Referral From Chaplain  Consult/Referral To [facility] [place] responded to a request to complete an advanced directive. Patient has all her paperwork together. The notary and witnesses were present documents were completed.   Danice Goltz [facility]  [phone]

## 2022-04-11 NOTE — Transfer of Care (Signed)
Immediate Anesthesia Transfer of Care Note  Patient: Jennifer [name]  Procedure(s) Performed: LEFT FRONTAL CRANIOTOMY FOR RESECTION OF TUMOR WITH BRAINLAB (Left) APPLICATION OF CRANIAL NAVIGATION  Patient Location: PACU  Anesthesia Type:General  Level of Consciousness: awake and alert   Airway & Oxygen Therapy: Patient Spontanous Breathing and Patient connected to nasal cannula oxygen  Post-op Assessment: Report given to RN and Post -op Vital signs reviewed and stable  Post vital signs: Reviewed and stable  Last Vitals:  Vitals Value Taken Time  BP 116/64 04/11/22 1831  Temp 36.7 C 04/11/22 1830  Pulse 79 04/11/22 1834  Resp 11 04/11/22 1834  SpO2 97 % 04/11/22 1834  Vitals shown include unvalidated device data.  Last Pain:  Vitals:   04/11/22 1830  TempSrc:   PainSc: 0-No pain      Patients Stated Pain Goal: 0 (18/86/77 3736)  Complications: No notable events documented.

## 2022-04-11 NOTE — Anesthesia Postprocedure Evaluation (Signed)
Anesthesia Post Note  Patient: Jennifer [name]  Procedure(s) Performed: LEFT FRONTAL CRANIOTOMY FOR RESECTION OF TUMOR WITH BRAINLAB (Left) APPLICATION OF CRANIAL NAVIGATION     Patient location during evaluation: PACU Anesthesia Type: General Level of consciousness: sedated and patient cooperative Pain management: pain level controlled Vital Signs Assessment: post-procedure vital signs reviewed and stable Respiratory status: spontaneous breathing Cardiovascular status: stable Anesthetic complications: no   No notable events documented.  Last Vitals:  Vitals:   04/11/22 1915 04/11/22 1930  BP: 120/61 114/74  Pulse: 73 80  Resp: 15 16  Temp:  36.6 C  SpO2: 100% 100%    Last Pain:  Vitals:   04/11/22 1930  TempSrc:   PainSc: 0-No pain                 Nolon Nations

## 2022-04-11 NOTE — Anesthesia Procedure Notes (Signed)
Arterial Line Insertion Start/End8/18/2023 2:30 PM, 04/11/2022 2:45 PM Performed by: Lance Coon, CRNA, CRNA  Preanesthetic checklist: patient identified, IV checked, site marked, risks and benefits discussed, surgical consent, monitors and equipment checked, pre-op evaluation, timeout performed and anesthesia consent Lidocaine 1% used for infiltration Right, radial was placed Catheter size: 20 G Hand hygiene performed  and maximum sterile barriers used   Attempts: 2 Procedure performed without using ultrasound guided technique. Following insertion, dressing applied and Biopatch. Post procedure assessment: normal  Patient tolerated the procedure well with no immediate complications.

## 2022-04-11 NOTE — Anesthesia Procedure Notes (Signed)
Procedure Name: Intubation Date/Time: 04/11/2022 3:14 PM  Performed by: Lance Coon, CRNAPre-anesthesia Checklist: Patient identified, Emergency Drugs available, Suction available, Patient being monitored and Timeout performed Patient Re-evaluated:Patient Re-evaluated prior to induction Oxygen Delivery Method: Circle system utilized Preoxygenation: Pre-oxygenation with 100% oxygen Induction Type: IV induction Ventilation: Mask ventilation without difficulty Laryngoscope Size: Miller and 3 Grade View: Grade I Tube type: Oral Tube size: 7.0 mm Number of attempts: 1 Airway Equipment and Method: Stylet Placement Confirmation: ETT inserted through vocal cords under direct vision, positive ETCO2 and breath sounds checked- equal and bilateral Secured at: 21 cm Tube secured with: Tape Dental Injury: Teeth and Oropharynx as per pre-operative assessment

## 2022-04-11 NOTE — Progress Notes (Signed)
Patient ID: Jennifer [name], female   DOB: Jun 12, 1956, 66 y.o.   MRN: [id number] BP (!) 109/56   Pulse 64   Temp 98.2 F (36.8 C) (Axillary)   Resp 18   Ht '5\' 2"'$  (1.575 m)   Wt 68.3 kg   SpO2 97%   BMI 27.54 kg/m  Alert, aphasic, mild right facial droop Follows most commands Moving all extremities OR for resection.

## 2022-04-11 NOTE — Op Note (Signed)
BP 113/78   Pulse 64   Temp 98.2 F (36.8 C) (Axillary)   Resp 18   Ht '5\' 2"'$  (1.575 m)   Wt 68.3 kg   SpO2 100%   BMI 27.54 kg/m  04/11/2022  6:34 PM  PATIENT:  Jennifer [name]  66 y.o. female  PRE-OPERATIVE DIAGNOSIS:  BRAIN TUMOR  POST-OPERATIVE DIAGNOSIS:  Brain tumor  PROCEDURE:  Procedure(s): LEFT FRONTAL CRANIOTOMY FOR RESECTION OF TUMOR WITH BRAINLAB APPLICATION OF CRANIAL NAVIGATION  SURGEON: Surgeon(s): Ashok Pall, MD Dawley, Theodoro Doing, DO  ASSISTANTS:Dawley, Pieter Partridge  ANESTHESIA:   local and general  EBL:  Total I/O In: 263.8 [I.V.:263.8] Out: 680 [Urine:630; Blood:50]  BLOOD ADMINISTERED:none  CELL SAVER GIVEN:na  COUNT:per nursing  DRAINS: none   SPECIMEN:  Source of Specimen:  left frontal lobe  DICTATION: EVAH RASHID was taken to the operating room, intubated, and placed under a general anesthetic without difficulty. She was positioned supine on the operating table. I attached the [place] head holder, then attached his head to the bed with the [place] adapter. Her head was was prepped and draped in a sterile manner. I attached the adapter for the brainlab system to the [place]. I, with the preoperative planning done with the head ct performed for navigation, localized the head with the brain lab system. Navigation parameters were checked and were within range of accuracy needed.  I infiltrated the incision with lidocaine. I incised the scalp with a 10 blade in a curvilinear manner allowing for exposure of the left frontal lobe. I placed Raney clips on the scalp edges and placed fish hooks to retract the scalp flap rostrally. With the navigation I placed two burr holes and turned the craniotomy exposing the dura overlying the tumor. I opened the dura basing the flap medially along the midline. I made a corticotomy in the brain with cautery and suction along with scissors. With suction I resected most of the tumor. I did send samples of the mass to  pathology. With navigation I did my best to remove pathologic tissue. Once I was satisfied with the resection I achieved hemostasis along the resection cavity. I did reach the falx along the midline where tumor was located.  I approximated the dura, tack up sutures also placed. I replaced the skull flap with plates and screws. I closed the scalp with a galeal closure and staples. I applied a sterile dressing. She was extubated in the room and brought to PACU in stable condition.   PLAN OF CARE: Admit to inpatient   PATIENT DISPOSITION:  PACU - hemodynamically stable.   Delay start of Pharmacological VTE agent (>24hrs) due to surgical blood loss or risk of bleeding:  no

## 2022-04-11 NOTE — Anesthesia Preprocedure Evaluation (Addendum)
Anesthesia Evaluation  Patient identified by MRN, date of birth, ID band Patient awake    Reviewed: Allergy & Precautions, NPO status , Patient's Chart, lab work & pertinent test results  Airway Mallampati: II  TM Distance: >3 FB Neck ROM: Full    Dental no notable dental hx.    Pulmonary neg pulmonary ROS,    Pulmonary exam normal        Cardiovascular hypertension, Pt. on medications  Rhythm:Regular Rate:Normal     Neuro/Psych  Headaches, Left frontal brain tumor negative psych ROS   GI/Hepatic negative GI ROS, Neg liver ROS,   Endo/Other  negative endocrine ROS  Renal/GU negative Renal ROS  negative genitourinary   Musculoskeletal negative musculoskeletal ROS (+)   Abdominal Normal abdominal exam  (+)   Peds  Hematology negative hematology ROS (+)   Anesthesia Other Findings   Reproductive/Obstetrics                            Anesthesia Physical Anesthesia Plan  ASA: 3  Anesthesia Plan: General   Post-op Pain Management:    Induction: Intravenous  PONV Risk Score and Plan: 3 and Ondansetron, Dexamethasone, Midazolam and Treatment may vary due to age or medical condition  Airway Management Planned: Mask and Oral ETT  Additional Equipment: Arterial line  Intra-op Plan:   Post-operative Plan: Possible Post-op intubation/ventilation  Informed Consent: I have reviewed the patients History and Physical, chart, labs and discussed the procedure including the risks, benefits and alternatives for the proposed anesthesia with the patient or authorized representative who has indicated his/her understanding and acceptance.     Dental advisory given  Plan Discussed with: CRNA  Anesthesia Plan Comments: (Lab Results      Component                Value               Date                      WBC                      7.3                 04/10/2022                HGB                       12.9                04/10/2022                HCT                      38.5                04/10/2022                MCV                      94.8                04/10/2022                PLT                      212  04/10/2022           Lab Results      Component                Value               Date                      NA                       135                 04/09/2022                K                        4.1                 04/09/2022                CO2                      22                  04/09/2022                GLUCOSE                  92                  04/09/2022                BUN                      32 (H)              04/09/2022                CREATININE               0.99                04/10/2022                CALCIUM                  9.4                 04/09/2022                GFRNONAA                 >60                 04/10/2022          )        Anesthesia Quick Evaluation

## 2022-04-11 NOTE — Progress Notes (Signed)
Dr. Christella Noa aware pt received heparin at [phone]

## 2022-04-12 ENCOUNTER — Inpatient Hospital Stay (HOSPITAL_COMMUNITY): Payer: Medicare Other

## 2022-04-12 LAB — CREATININE, SERUM
Creatinine, Ser: 1.11 mg/dL — ABNORMAL HIGH (ref 0.44–1.00)
GFR, Estimated: 55 mL/min — ABNORMAL LOW (ref >60)

## 2022-04-12 LAB — CBC
HCT: 34.2 % — ABNORMAL LOW (ref 36.0–46.0)
Hemoglobin: 11.4 g/dL — ABNORMAL LOW (ref 12.0–15.0)
MCH: 32.3 pg (ref 26.0–34.0)
MCHC: 33.3 g/dL (ref 30.0–36.0)
MCV: 96.9 fL (ref 80.0–100.0)
Platelets: 243 10*3/uL (ref 150–400)
RBC: 3.53 MIL/uL — ABNORMAL LOW (ref 3.87–5.11)
RDW: 12.3 % (ref 11.5–15.5)
WBC: 16 10*3/uL — ABNORMAL HIGH (ref 4.0–10.5)
nRBC: 0 % (ref 0.0–0.2)

## 2022-04-12 MED ORDER — METOPROLOL TARTRATE 5 MG/5ML IV SOLN: 5.0000 mg | Freq: Four times a day (QID) | INTRAVENOUS | Status: DC

## 2022-04-12 MED ORDER — NALOXONE HCL 0.4 MG/ML IJ SOLN: 0.0800 mg | INTRAMUSCULAR | Status: DC | PRN

## 2022-04-12 MED ORDER — GADOBUTROL 1 MMOL/ML IV SOLN
7.0000 mL | Freq: Once | INTRAVENOUS | Status: AC | PRN
  Administered 2022-04-12: 7 mL via INTRAVENOUS

## 2022-04-12 MED ORDER — ONDANSETRON HCL 4 MG/2ML IJ SOLN
4.0000 mg | INTRAMUSCULAR | Status: DC | PRN
Start: 2022-04-12 — End: 2022-04-15

## 2022-04-12 MED ORDER — PANTOPRAZOLE SODIUM 40 MG IV SOLR: 40.0000 mg | Freq: Every day | INTRAVENOUS | Status: DC

## 2022-04-12 MED ORDER — DEXAMETHASONE 4 MG PO TABS
4.0000 mg | ORAL_TABLET | Freq: Three times a day (TID) | ORAL | Status: DC
  Administered 2022-04-14 – 2022-04-15 (×5): 4 mg via ORAL
  Filled 2022-04-12 (×5): qty 1

## 2022-04-12 MED ORDER — ONDANSETRON HCL 4 MG PO TABS: 4.0000 mg | ORAL_TABLET | ORAL | Status: DC | PRN

## 2022-04-12 MED ORDER — PANTOPRAZOLE SODIUM 40 MG PO TBEC
40.0000 mg | DELAYED_RELEASE_TABLET | Freq: Every day | ORAL | Status: DC
  Administered 2022-04-12 – 2022-04-14 (×3): 40 mg via ORAL
  Filled 2022-04-12 (×3): qty 1

## 2022-04-12 MED ORDER — DEXAMETHASONE 4 MG PO TABS
6.0000 mg | ORAL_TABLET | Freq: Four times a day (QID) | ORAL | Status: AC
  Administered 2022-04-12 – 2022-04-13 (×4): 6 mg via ORAL
  Filled 2022-04-12 (×4): qty 4

## 2022-04-12 MED ORDER — LABETALOL HCL 5 MG/ML IV SOLN: 10.0000 mg | INTRAVENOUS | Status: DC | PRN

## 2022-04-12 MED ORDER — DEXAMETHASONE 4 MG PO TABS
4.0000 mg | ORAL_TABLET | Freq: Four times a day (QID) | ORAL | Status: AC
  Administered 2022-04-13 – 2022-04-14 (×4): 4 mg via ORAL
  Filled 2022-04-12 (×4): qty 1

## 2022-04-12 NOTE — Progress Notes (Signed)
   Providing Compassionate, Quality Care - Together  NEUROSURGERY PROGRESS NOTE   S: No issues overnight.   O: EXAM:  BP (!) 102/40   Pulse 78   Temp 98.7 F (37.1 C) (Axillary)   Resp 14   Ht '5\' 2"'$  (1.575 m)   Wt 68.3 kg   SpO2 100%   BMI 27.54 kg/m   Awake, alert, oriented x3 PERRL Speech: intermittent word finding difficulty, mild CNs grossly intact  5/5 BUE/BLE  Head wrap in place, no staining  ASSESSMENT:  66 y.o. female with   Left frontal glioma  -Postoperative day 1, status post left frontal craniotomy for resection of tumor  PLAN: -PT/OT -Continue ICU care -Postoperative MRI pending    Thank you for allowing me to participate in this patient's care.  Please do not hesitate to call with questions or concerns.   Elwin Sleight, [facility] Cell: [phone]

## 2022-04-13 NOTE — Progress Notes (Signed)
   Providing Compassionate, Quality Care - Together  NEUROSURGERY PROGRESS NOTE   S: No issues overnight.  Sitting up in bedside chair this morning  O: EXAM:  BP 131/72 (BP Location: Right Arm)   Pulse (!) 106   Temp 97.7 F (36.5 C) (Oral)   Resp 17   Ht '5\' 2"'$  (1.575 m)   Wt 68.3 kg   SpO2 97%   BMI 27.54 kg/m   Awake, alert, oriented x3 PERRL Speech: intermittent word finding difficulty, mild CNs grossly intact  5/5 BUE/BLE  Head wrap in place, no staining Expected periorbital ecchymoses, left   ASSESSMENT:  66 y.o. female with    Left frontal glioma   -Postoperative day 2, status post left frontal craniotomy for resection of tumor   PLAN: -PT/OT -Continue ICU care -Postoperative MRI reviewed, expected postoperative changes. -Decadron taper       Thank you for allowing me to participate in this patient's care.  Please do not hesitate to call with questions or concerns.   Elwin Sleight, [facility] Cell: [phone]

## 2022-04-14 ENCOUNTER — Encounter (HOSPITAL_COMMUNITY): Payer: Self-pay | Admitting: Neurosurgery

## 2022-04-14 NOTE — Evaluation (Signed)
Occupational Therapy Evaluation Patient Details Name: Jennifer [name] MRN: [id number] DOB: March 09, 1956 Today's Date: 04/14/2022   History of Present Illness The pt is a 66 yo female presenting 8/16 with AMS and slurred speech. CT showed: L frontal mass and is now s/p resection 8/18. PMH includes: HTN, HLD, and migraines.   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 Upon evaluation pt has functional limitations due to impaired cognition. She required cues for safety and insight, orientation, problem solving and attention. Automatic tasks during ADLs were [facility] cognitively, but she was unable to dual-task. She require maximal cues to complete a simple clock drawing task. Overall, she was supervision with cues for all mobility and ADLs. She will benefit from OT acutely. Recommend neuro OP OT to address cognitive deficits in relation to I/ADLs at d/c and 24/7 superivsion  family.      Recommendations for follow up therapy are one component of a multi-disciplinary discharge planning process, led by the attending physician.  Recommendations may be updated based on patient status, additional functional criteria and insurance authorization.   Follow Up Recommendations  Outpatient OT    Assistance Recommended at Discharge Frequent or constant Supervision/Assistance  Patient can return home with the following A little help with walking and/or transfers;A little help with bathing/dressing/bathroom;Assistance with cooking/housework;Direct supervision/assist for medications management;Direct supervision/assist for financial management;Assist for transportation;Help with stairs or ramp for entrance    Functional Status Assessment  Patient has had a recent decline in their functional status and demonstrates the ability to make significant improvements in function in a reasonable and predictable amount of time.  Equipment  Recommendations  None recommended by OT    Recommendations for Other Services       Precautions / Restrictions Precautions Precautions: Fall Restrictions Weight Bearing Restrictions: No      Mobility Bed Mobility Overal bed mobility: Modified Independent             General bed mobility comments: slightly increased time    Transfers Overall transfer level: Needs assistance Equipment used: None Transfers: Sit to/from Stand Sit to Stand: Supervision           General transfer comment: supervision for safety, no instability      Balance Overall balance assessment: Mild deficits observed, not formally tested                                         ADL either performed or assessed with clinical judgement   ADL Overall ADL's : Needs assistance/impaired Eating/Feeding: Supervision/ safety;Sitting   Grooming: Supervision/safety;Standing   Upper Body Bathing: Set up;Supervision/ safety;Sitting   Lower Body Bathing: Supervison/ safety;Sit to/from stand   Upper Body Dressing : Supervision/safety;Sitting   Lower Body Dressing: Supervision/safety;Sit to/from stand   Toilet Transfer: Supervision/safety;Ambulation   Toileting- Clothing Manipulation and Hygiene: Supervision/safety;Sitting/lateral lean       Functional mobility during ADLs: Supervision/safety General ADL Comments: cues provided for sequencing and safety. pt unable to multi-task or follow multi-step command during ADLs     Vision Baseline Vision/History: 1 Wears glasses Vision Assessment?: No apparent visual deficits Additional Comments: seemingly WFL for tasks assessed     Perception     Praxis      Pertinent Vitals/Pain Pain Assessment Pain Assessment: Faces Faces Pain Scale: Hurts a little bit  Pain Location: headache Pain Descriptors / Indicators: Discomfort Pain Intervention(s): Limited activity within patient's tolerance, Monitored during session     Hand  Dominance Right   Extremity/Trunk Assessment Upper Extremity Assessment Upper Extremity Assessment: RUE deficits/detail;LUE deficits/detail RUE Deficits / Details: overall WFL for BADLs and handwriting LUE Deficits / Details: overall WFL for BADLs   Lower Extremity Assessment Lower Extremity Assessment: Defer to PT evaluation   Cervical / Trunk Assessment Cervical / Trunk Assessment: Normal;Other exceptions Cervical / Trunk Exceptions: brain tumor resection   Communication Communication Communication: Expressive difficulties;Other (comment)   Cognition Arousal/Alertness: Awake/alert Behavior During Therapy: WFL for tasks assessed/performed Overall Cognitive Status: Impaired/Different from baseline                                 General Comments: Oriented to self, place and situation. perseverated throughout session, needed cues for re-direction. unable to complete complex commands     General Comments  VSS on RA, son present at the end of the session. Pt limited by cognition    Exercises Other Exercises Other Exercises: dual task ambulation and cog task. unable to name any animals in alphabetical order while walking. gait speed from 0.83 m/s to 0.47 m/s Other Exercises: clock draw with max A for completion   Shoulder Instructions      Home Living Family/patient expects to be discharged to:: Private residence Living Arrangements: Children (daughter - autisim) Available Help at Discharge: Family;Available 24 hours/day Type of Home: House Home Access: Stairs to enter [employer] of Steps: 4 Entrance Stairs-Rails: Right;Left;Can reach both Home Layout: One level     Bathroom Shower/Tub: Occupational psychologist: Handicapped height     Home Equipment: Grab bars - tub/shower   Additional Comments: lives with duaghter who is autistic      Prior Functioning/Environment Prior Level of Function : Independent/Modified  Independent;Driving             Mobility Comments: no AD ADLs 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        OT Problem List: Decreased activity tolerance;Decreased cognition;Decreased safety awareness;Decreased knowledge of precautions      OT Treatment/Interventions: Self-care/ADL training;Therapeutic exercise;DME and/or AE instruction;Therapeutic activities;Patient/family education;Balance training    OT Goals(Current goals can be found in the care plan section) Acute Rehab OT Goals Patient Stated Goal: back to normal OT Goal Formulation: With patient Time For Goal Achievement: 04/28/22 Potential to Achieve Goals: Good ADL Goals Additional ADL Goal #1: pt will complete all BADLs independently Additional ADL Goal #2: Pt will complete 3 step way finding tasks with min cues Additional ADL Goal #3: Pt will accurately complete IADL medicaiton management task with with min cues  OT Frequency: Min 2X/week    Co-evaluation PT/OT/SLP Co-Evaluation/Treatment: Yes Reason for Co-Treatment: Complexity of the patient's impairments (multi-system involvement)   OT goals addressed during session: ADL's and self-care      AM-PAC OT "6 Clicks" Daily Activity     Outcome Measure Help from another person eating meals?: A Little Help from another person taking care of personal grooming?: A Little Help from another person toileting, which includes using toliet, bedpan, or urinal?: A Little Help from another person bathing (including washing, rinsing, drying)?: A Little Help from another person to put on and taking off regular upper body clothing?: A Little Help from another person to put on and taking off regular lower body clothing?: A Little 6 Click  Score: 18   End of Session Nurse Communication: Mobility status  Activity Tolerance: Patient tolerated treatment well Patient left: in chair;with call bell/phone within reach;with chair alarm set;with family/visitor present  OT Visit  Diagnosis: Unsteadiness on feet (R26.81);Muscle weakness (generalized) (M62.81)                Time: 0947-0962 OT Time Calculation (min): 35 min Charges:  OT General Charges $OT Visit: 1 Visit OT Evaluation $OT Eval Moderate Complexity: 1 Mod    Arieonna Medine A [place] Tapp 04/14/2022, 11:01 AM

## 2022-04-14 NOTE — Evaluation (Signed)
Physical Therapy Evaluation Patient Details Name: Jennifer [name] MRN: [id number] DOB: 13-Sep-1955 Today's Date: 04/14/2022  History of Present Illness  The pt is a 66 yo female presenting 8/16 with AMS and slurred speech. CT showed: L frontal mass and is now s/p resection 8/18. PMH includes: HTN, HLD, and migraines.   Clinical Impression  Pt in bed upon arrival of PT, agreeable to evaluation at this time. 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 The pt now presents with minor limitations in functional mobility, strength, and dynamic stability, but demos significant deficits in cognition, awareness, and executive function. With addition of cognitive tasks to walking, the pt demos significantly slowed speed and mild increase in instability. (From gait speed 0.83 m/s to 0.47 m/s when asked to name animals in alphabetical order while walking). The pt will have good support from family and friends after d/c and will likely progress to being able to mobilize safely in the home, but will need significant supervision to manage safety due to cognitive impairments. Will continue to follow acutely.  Dynamic Gait Index (DGI): 20/24 (<19 indicates increased risk for falls)  Gait Speed: 0.83 m/s with normal walking and 0.47 m/s with addition of cognitive task (Gait speed < 1.9ms indicates increased risk of falls and <0.664m indicates increased risk of falls and dependence in ADLs)     Recommendations for follow up therapy are one component of a multi-disciplinary discharge planning process, led by the attending physician.  Recommendations may be updated based on patient status, additional functional criteria and insurance authorization.  Follow Up Recommendations Outpatient PT      Assistance Recommended at Discharge Frequent or constant Supervision/Assistance  Patient can return home with the following  A little help with  walking and/or transfers;A little help with bathing/dressing/bathroom;Assistance with cooking/housework;Direct supervision/assist for medications management;Direct supervision/assist for financial management;Assist for transportation;Help with stairs or ramp for entrance    Equipment Recommendations None recommended by PT  Recommendations for Other Services       Functional Status Assessment Patient has had a recent decline in their functional status and demonstrates the ability to make significant improvements in function in a reasonable and predictable amount of time.     Precautions / Restrictions Precautions Precautions: Fall Restrictions Weight Bearing Restrictions: No      Mobility  Bed Mobility Overal bed mobility: Modified Independent             General bed mobility comments: slightly increased time    Transfers Overall transfer level: Needs assistance Equipment used: None Transfers: Sit to/from Stand Sit to Stand: Supervision           General transfer comment: supervision for safety, no instability    Ambulation/Gait Ambulation/Gait assistance: Min guard Gait Distance (Feet): 250 Feet Assistive device: None Gait Pattern/deviations: Step-through pattern, Decreased stride length Gait velocity: 0.83 m/s with normal gait slowed to 0.47 m/s with addition of cognitive task Gait velocity interpretation: 1.31 - 2.62 ft/sec, indicative of limited community ambulator   General Gait Details: pt generally stable even with balance challenge. limited by cognition and most challenged by addition of cognitive tasks to walking  Stairs Stairs: Yes Stairs assistance: Supervision Stair Management: One rail Left, Alternating pattern, Forwards Number of Stairs: 4 General stair comments: single UE support with good stability     Balance Overall balance assessment: Mild deficits observed, not formally tested  Standardized  Balance Assessment Standardized Balance Assessment : Dynamic Gait Index   Dynamic Gait Index Level Surface: Normal Change in Gait Speed: Normal Gait with Horizontal Head Turns: Mild Impairment Gait with Vertical Head Turns: Mild Impairment Gait and Pivot Turn: Normal Step Over Obstacle: Mild Impairment Step Around Obstacles: Normal Steps: Mild Impairment Total Score: 20       Pertinent Vitals/Pain Pain Assessment Pain Assessment: Faces Faces Pain Scale: Hurts a little bit Pain Location: headache Pain Descriptors / Indicators: Discomfort Pain Intervention(s): Limited activity within patient's tolerance, Monitored during session, Repositioned    Home Living Family/patient expects to be discharged to:: Private residence Living Arrangements: Other relatives (sister) Available Help at Discharge: Family Type of Home: House (townhome) Home Access: Stairs to enter Entrance Stairs-Rails: Right;Left;Can reach both Technical brewer of Steps: 4   Home Layout: One level Home Equipment: Grab bars - tub/shower Additional Comments: lives with sister who is autistic    Prior Function Prior Level of Function : Independent/Modified Independent;Driving             Mobility Comments: no AD 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 perseverated throughout session, needed  cues for re-direction. unable to complete complex commands        General Comments General comments (skin integrity, edema, etc.): pt most impacted by cognitive tasks    Exercises Other Exercises Other Exercises: dual task ambulation and cog task. unable to name any animals in alphabetical order while walking. gait speed from 0.83 m/s to 0.47 m/s   Assessment/Plan    PT Assessment Patient needs continued PT services  PT Problem List Decreased strength;Decreased range of motion;Decreased activity tolerance;Decreased balance;Decreased mobility;Decreased coordination;Decreased cognition;Decreased safety awareness;Decreased knowledge of precautions       PT Treatment Interventions DME instruction;Gait training;Stair training;Functional mobility training;Therapeutic activities;Therapeutic exercise;Balance training;Patient/family education    PT Goals (Current goals can be found in the Care Plan section)  Acute Rehab PT Goals Patient Stated Goal: return home PT Goal Formulation: With patient Time For Goal Achievement: 04/28/22 Potential to Achieve Goals: Good    Frequency Min 4X/week        AM-PAC PT "6 Clicks" Mobility  Outcome Measure Help needed turning from your back to your side while in a flat bed without using bedrails?: None Help needed moving from lying on your back to sitting on the side of a flat bed without using bedrails?: None Help needed moving to and from a bed to a chair (including a wheelchair)?: A Little Help needed standing up from a chair using your arms (e.g., wheelchair or bedside chair)?: A Little Help needed to walk in hospital room?: A Little Help needed climbing 3-5 steps with a railing? : A Little 6 Click Score: 20    End of Session Equipment Utilized During Treatment:  Gait belt Activity Tolerance: Patient tolerated treatment well Patient left: in chair;with call bell/phone within reach;with chair alarm set;with family/visitor present Nurse  Communication: Mobility status PT Visit Diagnosis: Other abnormalities of gait and mobility (R26.89)    Time: 6438-3779 PT Time Calculation (min) (ACUTE ONLY): 33 min   Charges:   PT Evaluation $PT Eval Moderate Complexity: 1 Mod          West Carbo, PT, DPT   Acute Rehabilitation Department  Sandra Cockayne 04/14/2022, 10:32 AM

## 2022-04-14 NOTE — Progress Notes (Signed)
Patient ID: Jennifer [name], female   DOB: [birth year], 66 y.o.   MRN: [id number] BP 126/63   Pulse (!) 58   Temp 98.4 F (36.9 C) (Oral)   Resp 17   Ht '5\' 2"'$  (1.575 m)   Wt 68.3 kg   SpO2 96%   BMI 27.54 kg/m  Alert and oriented to person, place, situation, place Perseverates, speech is fluent. It is clear Wound is clean, dry, no signs of infeciton Moving all extremities Probable discharge tomorrow

## 2022-04-15 MED ORDER — LEVETIRACETAM 500 MG PO TABS: 500.0000 mg | ORAL_TABLET | Freq: Two times a day (BID) | ORAL | 1 refills | Status: DC

## 2022-04-15 MED ORDER — DEXAMETHASONE 4 MG PO TABS: 4.0000 mg | ORAL_TABLET | Freq: Two times a day (BID) | ORAL | 0 refills | Status: DC

## 2022-04-15 NOTE — Progress Notes (Signed)
Physical Therapy Treatment Patient Details Name: Jennifer [name] MRN: [id number] DOB: 20-May-1956 Today's Date: 04/15/2022   History of Present Illness The pt is a 66 yo female presenting 8/16 with AMS and slurred speech. CT showed: L frontal mass and is now s/p resection 8/18. PMH includes: HTN, HLD, and migraines.    PT Comments    Patient progressing well towards PT goals. Pt in a joyous mood today knowing she gets to d/c home. Session focused on dual tasking and cognitive tasks during mobility. Able to recall 3/3 words for STM at end of session with increased time. Difficulty with dual tasking- naming tasks while walking. Unable to solve simple grocery store math problem. Able to name 3 animals that start with the letter C with increased time and needed to stop at times. Needs repetition to follow multi step commands at times. Noted to have decreased gait speed/stopping during cognitive challenges. Continues to have deficits related to awareness and executive function. Pt safe to d/c home with family support but will need but will need significant supervision to manage safety due to cognitive impairments. Will follow.   Recommendations for follow up therapy are one component of a multi-disciplinary discharge planning process, led by the attending physician.  Recommendations may be updated based on patient status, additional functional criteria and insurance authorization.  Follow Up Recommendations  Outpatient PT     Assistance Recommended at Discharge Frequent or constant Supervision/Assistance  Patient can return home with the following A little help with walking and/or transfers;A little help with bathing/dressing/bathroom;Assistance with cooking/housework;Direct supervision/assist for medications management;Direct supervision/assist for financial management;Assist for transportation;Help with stairs or ramp for entrance   Equipment Recommendations  None recommended by PT     Recommendations for Other Services       Precautions / Restrictions Precautions Precautions: Fall Restrictions Weight Bearing Restrictions: No     Mobility  Bed Mobility Overal bed mobility: Modified Independent             General bed mobility comments: slightly increased time    Transfers Overall transfer level: Needs assistance Equipment used: None Transfers: Sit to/from Stand Sit to Stand: Supervision           General transfer comment: supervision for safety, no instability, stood from EOB x1, from toilet x1, transferred to chair post ambulation.    Ambulation/Gait Ambulation/Gait assistance: Supervision Gait Distance (Feet): 1000 Feet Assistive device: None Gait Pattern/deviations: Step-through pattern, Decreased stride length Gait velocity: 1.8 ft/sec Gait velocity interpretation: 1.31 - 2.62 ft/sec, indicative of limited community ambulator   General Gait Details: Slow, guarded gait with varying speeds when performing cognitive tasks, decreasing during dual tasking. No overt LOB. See balance section for details.   Stairs Stairs: Yes Stairs assistance: Supervision Stair Management: One rail Left, Alternating pattern, Forwards Number of Stairs: 4 General stair comments: single UE support with good stability   Wheelchair Mobility    Modified Rankin (Stroke Patients Only)       Balance Overall balance assessment: Needs assistance Sitting-balance support: Feet supported, No upper extremity supported Sitting balance-Leahy Scale: Good     Standing balance support: During functional activity Standing balance-Leahy Scale: Good               High level balance activites: Backward walking, Direction changes, Sudden stops High Level Balance Comments: Able to perform above with no evidence of imbalance.            Cognition Arousal/Alertness: Awake/alert Behavior During Therapy: WFL for  tasks assessed/performed Overall Cognitive  Status: Impaired/Different from baseline                                 General Comments: Oriented to self, place and situation by using calendar on wall. Able to recall 3/3 words for STM at end of session with increased time. Difficulty with dual tasking- naming tasks while walking. Only able to name 1 veggie and no desserts. Unable to solve simple grocery store math problem. Able to name 3 animals that start with the letter C with increased time and needed to stop at times. Needs repetition to follow multi step commands at times.        Exercises      General Comments        Pertinent Vitals/Pain Pain Assessment Pain Assessment: Faces Faces Pain Scale: Hurts a little bit Pain Location: headache Pain Descriptors / Indicators: Headache Pain Intervention(s): Monitored during session    Home Living                          Prior Function            PT Goals (current goals can now be found in the care plan section) Progress towards PT goals: Progressing toward goals    Frequency    Min 4X/week      PT Plan Current plan remains appropriate    Co-evaluation              AM-PAC PT "6 Clicks" Mobility   Outcome Measure  Help needed turning from your back to your side while in a flat bed without using bedrails?: None Help needed moving from lying on your back to sitting on the side of a flat bed without using bedrails?: None Help needed moving to and from a bed to a chair (including a wheelchair)?: A Little Help needed standing up from a chair using your arms (e.g., wheelchair or bedside chair)?: A Little Help needed to walk in hospital room?: A Little Help needed climbing 3-5 steps with a railing? : A Little 6 Click Score: 20    End of Session Equipment Utilized During Treatment: Gait belt Activity Tolerance: Patient tolerated treatment well Patient left: in chair;with call bell/phone within reach Nurse Communication: Mobility  status PT Visit Diagnosis: Other abnormalities of gait and mobility (R26.89)     Time: 5797-2820 PT Time Calculation (min) (ACUTE ONLY): 21 min  Charges:  $Neuromuscular Re-education: 8-22 mins                     Marisa Severin, PT, DPT Acute Rehabilitation Services Secure chat preferred Office [phone]      Jennifer [name] 04/15/2022, 9:25 AM

## 2022-04-15 NOTE — Discharge Summary (Signed)
Physician Discharge Summary  Patient ID: SEPTEMBER MORMILE MRN: [id number] DOB/AGE: Feb 24, 1956 66 y.o.  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 Fusaro was admitted and taken to the OR for an uncomplicated left frontal craniotomy for tumor resection. Post op she maintains an expressive aphasia. Speech is clear. Moving all extremities well. Wound is clean, dry, no signs of infection. Will be discharged with plans for PT, OT, and speech.  Treatments: surgery: Left frontal craniotomy for tumor resection  Discharge Exam: Blood pressure 118/82, pulse 88, temperature 97.8 F (36.6 C), temperature source Oral, resp. rate 16, height '5\' 2"'$  (1.575 m), weight 68.3 kg, SpO2 97 %. General appearance: alert, cooperative, appears stated age, and no distress Neurologic: Alert and oriented X 3, normal strength and tone. Normal symmetric reflexes. Normal coordination and gait Mental status: Alert, oriented, thought content appropriatet  Disposition: Discharge disposition: 01-Home or Self Care      BRAIN TUMOR Discharge Instructions     Ambulatory referral to Occupational Therapy   Complete by: As directed    Ambulatory referral to Physical Therapy   Complete by: As directed    Ambulatory referral to Speech Therapy   Complete by: As directed       Allergies as of 04/15/2022   No Known Allergies      Medication List     STOP taking these medications    meloxicam 7.5 MG tablet Commonly known as: MOBIC       TAKE these medications    amLODipine 5 MG tablet Commonly known as: NORVASC TAKE 1 TABLET BY MOUTH EVERY DAY   atorvastatin 10 MG tablet Commonly known as: LIPITOR TAKE 1 TABLET BY MOUTH EVERY DAY   busPIRone 5 MG tablet Commonly known as: BUSPAR TAKE 1 TABLET BY MOUTH THREE TIMES A DAY    dexamethasone 4 MG tablet Commonly known as: DECADRON Take 1 tablet (4 mg total) by mouth 2 (two) times daily with a meal.   fluticasone 50 MCG/ACT nasal spray Commonly known as: FLONASE SPRAY 2 SPRAYS INTO EACH NOSTRIL EVERY DAY What changed: See the new instructions.   losartan 50 MG tablet Commonly known as: COZAAR TAKE 1 TABLET BY MOUTH EVERY DAY   OMEGA-3 FISH OIL PO Take 1 capsule by mouth daily at 6 (six) AM.   oxybutynin 5 MG 24 hr tablet Commonly known as: DITROPAN-XL TAKE 1 TABLET BY MOUTH EVERYDAY AT BEDTIME What changed: See the new instructions.   topiramate 50 MG tablet Commonly known as: TOPAMAX TAKE 1 TABLET BY MOUTH TWICE A DAY   traMADol 50 MG tablet Commonly known as: ULTRAM Take 50 mg by mouth every 8 (eight) hours as needed.   traZODone 50 MG tablet Commonly known as: DESYREL TAKE 0.5-1 TABLETS (25-50 MG TOTAL) BY MOUTH AT BEDTIME AS NEEDED FOR SLEEP.        Follow-up Information     [facility].   Specialty: Rehabilitation Why: Outpatient physical, occupational and speech therapies.  Please call ASAP to schedule appointments. Contact information: [address] [place] [id number] [place] [address] [phone]        Ashok Pall, MD Follow up in 1 week(s).   Specialty: Neurosurgery Why: call to make an appointment for staple removal Contact information: [address] [place] [address] [place] [address] [phone]  Signed: Ashok Pall 04/15/2022, 5:35 PM

## 2022-04-15 NOTE — Progress Notes (Signed)
   04/15/22 1400  Clinical Encounter Type  Visited With Patient  Visit Type Follow-up  Referral From Chaplain  Consult/Referral To Chaplain   Chaplain visited with patient, Jennifer [name], this afternoon. She was engaging with me and pleased that I stopped in. [name] expects to go home and be with her family today. I prayed that she continues her healing process allows grace to enter in as she lives a new normal.     [facility]  [phone]

## 2022-04-15 NOTE — Discharge Instructions (Signed)
Craniotomy °Care After °Please read the instructions outlined below and refer to this sheet in the next few weeks. These discharge instructions provide you with general information on caring for yourself after you leave the hospital. Your surgeon may also give you specific instructions. While your treatment has been planned according to the most current medical practices available, unavoidable complications occasionally occur. If you have any problems or questions after discharge, please call your surgeon. °Although there are many types of brain surgery, recovery following craniotomy (surgical opening of the skull) is much the same for each. However, recovery depends on many factors. These include the type and severity of brain injury and the type of surgery. It also depends on any nervous system function problems (neurological deficits) before surgery. If the craniotomy was done for cancer, chemotherapy and radiation could follow. You could be in the hospital from 5 days to a couple weeks. This depends on the type of surgery, findings, and whether there are complications. °HOME CARE INSTRUCTIONS  °· It is not unusual to hear a clicking noise after a craniotomy, the plates and screws used to attach the bone flap can sometimes cause this. It is a normal occurrence if this does happen °· Do not drive for 10 days after the operation °· Your scalp may feel spongy for a while, because of fluid under it. This will gradually get better. Occasionally, the surgeon will not replace the bone that was removed to access the brain. If there is a bony defect, the surgeon will ask you to wear a helmet for protection. This is a discussion you should have with your surgeon prior to leaving the hospital (discharge). °· Numbness may persist in some areas of your scalp. °· Take all medications as directed. Sometimes steroids to control swelling are prescribed. Anticonvulsants to prevent seizures may also be given. Do not use alcohol,  other drugs, or medications unless your surgeon says it is OK. °· Keep the wound dry and clean. The wound may be washed gently with soap and water. Then, you may gently blot or dab it dry, without rubbing. Do not take baths, use swimming pools or hot tubs for 10 days, or as instructed by your caregiver. It is best to wait to see you surgeon at your first postoperative visit, and to get directions at that time. °· Only take over-the-counter or prescription medicines for pain, discomfort, or fever as directed by your caregiver. °· You may continue your normal diet, as directed. °· Walking is OK for exercise. Wait at least 3 months before you return to mild, non-contact sports or as your surgeon suggests. Contact sports should be avoided for at least 1 year, unless your surgeon says it is OK. °· If you are prescribed steroids, take them exactly as prescribed. If you start having a decrease in nervous system functions (neurological deficits) and headaches as the dose of steroids is reduced, tell your surgeon right away. °· When the anticonvulsant prescription is finished you no longer need to take it. °SEEK IMMEDIATE MEDICAL CARE IF:  °· You develop nausea, vomiting, severe headaches, confusion, or you have a seizure. °· You develop chest pain, a stiff neck, or difficulty breathing. °· There is redness, swelling, or increasing pain in the wound or pin insertion sites. °· You have an increase in swelling or bruising around the eyes. °· There is drainage or pus coming from the wound. °· You have an oral temperature above 102° F (38.9° C), not controlled by medicine. °·   You notice a foul smell coming from the wound or dressing. °· The wound breaks open (edges not staying together) after the stitches have been removed. °· You develop dizziness or fainting while standing. °· You develop a rash. °· You develop any reaction or side effects to the medications given. °Document Released: 11/11/2005 Document Revised: 11/03/2011  Document Reviewed: 08/20/2009 °[employer]® Patient Information ©2013 [employer]. ° °

## 2022-04-15 NOTE — Evaluation (Signed)
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[place]) 04/09/2022   Essential hypertension 06/13/2015   Bilateral chronic knee pain 10/14/2013   Chronic bilateral low back pain without sciatica 11/01/2012   S/P laparoscopic appendectomy 07/28/2012   Migraines 09/24/2011   Hyperlipidemia 09/24/2011   Past Medical History:  Past Medical History:  Diagnosis Date   Hyperlipidemia    Hypertension    Migraine    Past Surgical History:  Past Surgical History:  Procedure Laterality Date   APPENDECTOMY     APPLICATION OF CRANIAL NAVIGATION N/A 04/11/2022   Procedure: APPLICATION OF CRANIAL NAVIGATION;  Surgeon: Ashok Pall, MD;  Location: [place];  Service: Neurosurgery;  Laterality: N/A;   CRANIOTOMY Left 04/11/2022   Procedure: LEFT FRONTAL CRANIOTOMY FOR RESECTION OF TUMOR WITH Lucky Rathke;  Surgeon: Ashok Pall, MD;  Location: [facility];  Service: Neurosurgery;  Laterality: Left;  RM 21   LAPAROSCOPIC APPENDECTOMY  07/15/2012   Procedure: APPENDECTOMY LAPAROSCOPIC;  Surgeon: Zenovia Jarred, MD;  Location: [place];  Service: General;  Laterality: N/A;  Laparoscopic Appendectomy   HPI:  The pt is a 66 yo female presenting 8/16 with AMS and slurred speech. CT showed: L frontal mass and is now s/p resection 8/18. PMH includes: HTN, HLD, and migraines.   Assessment / Plan / Recommendation Clinical Impression  Pt presents with expressive more than receptive language impairment wtih speech intelligible. She was grossly accurate with simple yes/no questions and one-step commands, but had more significant difficulty once asked to perform two-step commands with some perseverative errors noted. She was 90% accurate with confrontational naming but initially not able to  name any words in divergent naming task. SLP allowed pt to choose a category more relevant to personal interests and then she was able to produce 5 words across one minute. With additional semantic cues, she doubled this to 10. Pt does seem to have some awareness of her word-finding errors and bennefits from cues for word retrieval. Cognition will need ongoing assessment as language is addressed, but do note some repetitive questions asked during this evaluation. Recommend OP SLP and heavy supervision/assistance upon discharge.    SLP Assessment  SLP Recommendation/Assessment: Patient needs continued Speech [facility] SLP Visit Diagnosis: Aphasia (R47.01)    Recommendations for follow up therapy are one component of a multi-disciplinary discharge planning process, led by the attending physician.  Recommendations may be updated based on patient status, additional functional criteria and insurance authorization.    Follow Up Recommendations  Outpatient SLP    Assistance Recommended at Discharge  Frequent or constant Supervision/Assistance  Functional Status Assessment Patient has had a recent decline in their functional status and demonstrates the ability to make significant improvements in function in a reasonable and predictable amount of time.  Frequency and Duration min 2x/week  2 weeks      SLP Evaluation Cognition  Overall Cognitive Status: Impaired/Different from baseline (although also question impact of language) Arousal/Alertness: Awake/alert Orientation Level: Disoriented to person;Disoriented to situation Attention: Sustained Sustained Attention: Appears intact Memory: Impaired Memory Impairment: Decreased short term memory Decreased Short Term Memory: Verbal basic (asking some repetitive questions)       Comprehension  Auditory Comprehension Overall Auditory Comprehension: Impaired Yes/No Questions: Within Functional Limits Commands: Impaired One Step  Basic  Commands: 75-100% accurate Two Step Basic Commands: 25-49% accurate    Expression Expression Primary Mode of Expression: Verbal Verbal Expression Overall Verbal Expression: Impaired Initiation: No impairment Automatic Speech: Name;Social Response Level of Generative/Spontaneous Verbalization: Phrase;Sentence Repetition: No impairment Naming: Impairment Confrontation:  (90% accurate) Divergent: Other (comment) (see clinical impressions) Verbal Errors: Aware of errors Effective Techniques: Semantic cues Non-Verbal Means of Communication: Not applicable   Oral / Motor  Motor Speech Overall Motor Speech: Appears within functional limits for tasks assessed            Osie Bond., M.A. [place] Office [phone]  Secure chat preferred  04/15/2022, 2:55 PM

## 2022-04-16 NOTE — Therapy (Signed)
OUTPATIENT PHYSICAL THERAPY NEURO EVALUATION   [name] MRN: [id number] DOB:July 04, 1956, 66 y.o., female Today's Date: 04/18/2022   PCP: Wendie Agreste, MD REFERRING PROVIDER: Ashok Pall, MD   PT End of Session - 04/18/22 1017     Visit Number 1    Number of Visits 9    Date for PT Re-Evaluation 05/16/22    Authorization Type [employer]BS Medicare    PT Start Time 0933    PT Stop Time 1012    PT Time Calculation (min) 39 min    Equipment Utilized During Treatment Gait belt    Activity Tolerance Patient tolerated treatment well    Behavior During Therapy WFL for tasks assessed/performed             Past Medical History:  Diagnosis Date   Hyperlipidemia    Hypertension    Migraine    Past Surgical History:  Procedure Laterality Date   APPENDECTOMY     APPLICATION OF CRANIAL NAVIGATION N/A 04/11/2022   Procedure: APPLICATION OF CRANIAL NAVIGATION;  Surgeon: Ashok Pall, MD;  Location: [place] [facility];  Service: Neurosurgery;  Laterality: N/A;   CRANIOTOMY Left 04/11/2022   Procedure: LEFT FRONTAL CRANIOTOMY FOR RESECTION OF TUMOR WITH Lucky Rathke;  Surgeon: Ashok Pall, MD;  Location: [facility];  Service: Neurosurgery;  Laterality: Left;  RM 21   LAPAROSCOPIC APPENDECTOMY  07/15/2012   Procedure: APPENDECTOMY LAPAROSCOPIC;  Surgeon: Zenovia Jarred, MD;  Location: [place] [facility];  Service: General;  Laterality: N/A;  Laparoscopic Appendectomy   Patient Active Problem List   Diagnosis Date Noted   Brain tumor, 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   Essential hypertension 06/13/2015   Bilateral chronic knee pain 10/14/2013   Chronic bilateral low back pain without sciatica 11/01/2012   S/P laparoscopic appendectomy 07/28/2012   Migraines 09/24/2011   Hyperlipidemia 09/24/2011    ONSET DATE: 04/09/22  REFERRING DIAG:  G93.89 (ICD-10-CM) - Brain mass  THERAPY DIAG:  Unsteadiness on feet  Muscle weakness (generalized)  Rationale for Evaluation and Treatment  Rehabilitation  SUBJECTIVE:                                                                                                                                                                                              SUBJECTIVE STATEMENT: Had surgery a week ago. Feeling a little slow physically and mentally but am making progress. Did not use an AD while in the hospital. Feeling fatigued, off balance. Had some dizziness early on after surgery but not anymore. Has been taking migraine meds since she was 89- having HAs since the surgery.   Pt accompanied by:  self  PERTINENT HISTORY: HTN, HLD, and migraines  PAIN:  Are you having pain? Yes: NPRS scale: 2-3/10 Pain location: L frontal head Pain description: headache Aggravating factors: loud sounds Relieving factors: dark room  PRECAUTIONS: Fall  WEIGHT BEARING RESTRICTIONS No  FALLS: Has patient fallen in last 6 months? No  LIVING ENVIRONMENT: Lives with:  son and daughter in law currently staying with her; usually lives with adult daughter with autism who required some assistance with ADLs  Lives in: House/apartment Stairs:  3 stairs to get inside and stairs to the laundry room  Has following equipment at home: Gilford Rile - 2 wheeled and Shower bench  PLOF: Independent, caregiver for adult daughter with autism   PATIENT GOALS "I want to get out of here as soon as possible."  OBJECTIVE:   DIAGNOSTIC FINDINGS: brain MRI: 04/09/22: 5.4 x 4.0 cm peripherally contrast-enhancing mass centered in the left frontal lobe with surrounding vasogenic edema, most consistent with a high-grade glioma. Rightward midline shift of 5 mm with impending herniation of the left cingulate gyrus.  COGNITION: Overall cognitive status:  good attention and focus during conversation; occasionally inappropriate laughter    SENSATION: WFL  COORDINATION: Alt supination/pronation, alt toe tap, finger to nose intact B   MUSCLE TONE: intact B   POSTURE:  No Significant postural limitations  LOWER EXTREMITY ROM:     Active  Right Eval Left Eval  Hip flexion    Hip extension    Hip abduction    Hip adduction    Hip internal rotation    Hip external rotation    Knee flexion    Knee extension    Ankle dorsiflexion 10 7  Ankle plantarflexion    Ankle inversion    Ankle eversion     (Blank rows = not tested)  LOWER EXTREMITY MMT:    MMT (in sitting) Right Eval Left Eval  Hip flexion 4 4+  Hip extension    Hip abduction 4 4  Hip adduction 4- 4-  Hip internal rotation    Hip external rotation    Knee flexion 4+ 4  Knee extension 4+ 5  Ankle dorsiflexion 4+ 4+  Ankle plantarflexion 4+ 4+  Ankle inversion    Ankle eversion    (Blank rows = not tested)  GAIT: Gait pattern:  wide BOS and with short slightly unsteady steps  Assistive device utilized: None Level of assistance: CGA   FUNCTIONAL TESTs:   [facility] PT Assessment - 04/18/22 0001       Standardized Balance Assessment   Standardized Balance Assessment Five Times Sit to Stand;Timed Up and Go Test;Dynamic Gait Index    Five times sit to stand comments  21.15 sec pushing off knees      Dynamic Gait Index   Level Surface Mild Impairment    Change in Gait Speed Mild Impairment    Gait with Horizontal Head Turns Mild Impairment    Gait with Vertical Head Turns Mild Impairment    Gait and Pivot Turn Mild Impairment    Step Over Obstacle Mild Impairment    Step Around Obstacles Normal    Steps Normal    Total Score 18      Timed Up and Go Test   Normal TUG (seconds) 17.46   without AD; pt required cueing to follow activity             PATIENT EDUCATION: Education details: prognosis, POC, HEP Person educated: Patient Education method: Explanation, Demonstration, Tactile cues, Verbal cues,  and Handouts Education comprehension: verbalized understanding   HOME EXERCISE PROGRAM: Access Code: [email or web address] URL: [email or web address] Date:  04/18/2022 Prepared by: [facility]  Exercises - Sit to Stand  - 1 x daily - 5 x weekly - 2 sets - 10 reps - Marching with Resistance  - 1 x daily - 5 x weekly - 2 sets - 10 reps - Standing Hip Abduction with Resistance at Ankles and Counter Support  - 1 x daily - 5 x weekly - 2 sets - 10 reps - Alternating Step Backward with Support  - 1 x daily - 5 x weekly - 2 sets - 10 reps   GOALS: Goals reviewed with patient? Yes  SHORT TERM GOALS: Target date: 05/02/2022  Patient to be independent with initial HEP. Baseline: HEP initiated Goal status: INITIAL    LONG TERM GOALS: Target date: 05/16/2022  Patient to be independent with advanced HEP. Baseline: Not yet initiated  Goal status: INITIAL  Patient to demonstrate B LE strength >/=4+/5.  Baseline: See above Goal status: INITIAL   Patient to score at least 20/24 on DGI in order to decrease risk of falls.  Baseline: 18/24 Goal status: INITIAL  Patient to complete TUG in <14 sec with LRAD in order to decrease risk of falls.   Baseline: 17 sec Goal status: INITIAL  Patient to demonstrate 5xSTS test in <15 sec in order to decrease risk of falls.  Baseline: 21 sec Goal status: INITIAL  Patient to report tolerance for 1 hour of housework without fatigue limiting.  Baseline: unable Goal status: INITIAL    ASSESSMENT:  CLINICAL IMPRESSION:  Patient is a 66 y/o F presenting to OPPT with c/o fatigue and imbalance s/p L frontal mass resection on 04/11/22, followed by D/C'd home on 04/15/22. Reports hx of migraines and L frontal HA since surgery. Not using AD for ambulation. Patient today presenting with mild B hip weakness, slightly unsteady gait, increased time required for gait and transfers. Patient's score on DGI indicates an increased risk of falls. Patient was educated on gentle strengthening and balance HEP and reported understanding. Prior to current episode, patient was independent and  was a caregiver for her adult daughter. Would benefit from skilled PT services 2 x/week for 4 weeks to address aforementioned impairments in order to optimize level of function.    OBJECTIVE IMPAIRMENTS Abnormal gait, decreased balance, decreased cognition, decreased endurance, decreased knowledge of use of DME, decreased strength, and pain.   ACTIVITY LIMITATIONS carrying, lifting, bending, sitting, standing, squatting, sleeping, stairs, dressing, hygiene/grooming, and caring for others  PARTICIPATION LIMITATIONS: meal prep, cleaning, laundry, driving, shopping, community activity, and church  PERSONAL FACTORS Age, Behavior pattern, Past/current experiences, Time since onset of injury/illness/exacerbation, and 3+ comorbidities: HTN, HLD, and migraines  are also affecting patient's functional outcome.   REHAB POTENTIAL: Good  CLINICAL DECISION MAKING: Evolving/moderate complexity  EVALUATION COMPLEXITY: Moderate  PLAN: PT FREQUENCY: 2x/week  PT DURATION: 4 weeks  PLANNED INTERVENTIONS: Therapeutic exercises, Therapeutic activity, Neuromuscular re-education, Balance training, Gait training, Patient/Family education, Self Care, Joint mobilization, Stair training, Vestibular training, Canalith repositioning, DME instructions, Aquatic Therapy, Dry Needling, Electrical stimulation, Cryotherapy, Moist heat, Taping, Manual therapy, and Re-evaluation  PLAN FOR NEXT SESSION: reassess HEP; progress dynamic balance activities; trial gait training with RW vs. SPC for improved safety and confidence with walking    Janene Harvey, PT, DPT 04/18/22 10:31 AM  [facility] at [facility] [place], [address]  [place], [place] [address] Phone # [phone] Fax # [phone]

## 2022-04-18 ENCOUNTER — Ambulatory Visit: Payer: Medicare Other | Attending: Neurosurgery | Admitting: Occupational Therapy

## 2022-04-18 ENCOUNTER — Encounter: Payer: Self-pay | Admitting: Physical Therapy

## 2022-04-18 ENCOUNTER — Other Ambulatory Visit: Payer: Self-pay

## 2022-04-18 ENCOUNTER — Ambulatory Visit: Payer: Medicare Other | Admitting: Physical Therapy

## 2022-04-18 ENCOUNTER — Ambulatory Visit: Payer: Medicare Other

## 2022-04-18 DIAGNOSIS — R2681 Unsteadiness on feet: Secondary | ICD-10-CM | POA: Diagnosis present

## 2022-04-18 DIAGNOSIS — R4701 Aphasia: Secondary | ICD-10-CM

## 2022-04-18 DIAGNOSIS — R41841 Cognitive communication deficit: Secondary | ICD-10-CM | POA: Diagnosis present

## 2022-04-18 DIAGNOSIS — I69818 Other symptoms and signs involving cognitive functions following other cerebrovascular disease: Secondary | ICD-10-CM | POA: Diagnosis present

## 2022-04-18 DIAGNOSIS — M6281 Muscle weakness (generalized): Secondary | ICD-10-CM

## 2022-04-18 DIAGNOSIS — R41840 Attention and concentration deficit: Secondary | ICD-10-CM | POA: Diagnosis present

## 2022-04-18 NOTE — Therapy (Incomplete)
OUTPATIENT SPEECH LANGUAGE PATHOLOGY APHASIA EVALUATION   [name] MRN: [id number] DOB:November 16, 1955, 66 y.o., female Today's Date: 04/20/2022  PCP: Wendie Agreste, MD REFERRING PROVIDER: Ashok Pall, MD    End of Session - 04/20/22 0007     Visit Number 1    Number of Visits 25    Date for SLP Re-Evaluation 07/17/22    SLP Start Time 1018    SLP Stop Time  1100    SLP Time Calculation (min) 42 min    Activity Tolerance Patient tolerated treatment well             Past Medical History:  Diagnosis Date   Hyperlipidemia    Hypertension    Migraine    Past Surgical History:  Procedure Laterality Date   APPENDECTOMY     APPLICATION OF CRANIAL NAVIGATION N/A 04/11/2022   Procedure: APPLICATION OF CRANIAL NAVIGATION;  Surgeon: Ashok Pall, MD;  Location: [place];  Service: Neurosurgery;  Laterality: N/A;   CRANIOTOMY Left 04/11/2022   Procedure: LEFT FRONTAL CRANIOTOMY FOR RESECTION OF TUMOR WITH Lucky Rathke;  Surgeon: Ashok Pall, MD;  Location: [place];  Service: Neurosurgery;  Laterality: Left;  RM 21   LAPAROSCOPIC APPENDECTOMY  07/15/2012   Procedure: APPENDECTOMY LAPAROSCOPIC;  Surgeon: Zenovia Jarred, MD;  Location: [place];  Service: General;  Laterality: N/A;  Laparoscopic Appendectomy   Patient Active Problem List   Diagnosis Date Noted   Brain tumor, 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   Essential hypertension 06/13/2015   Bilateral chronic knee pain 10/14/2013   Chronic bilateral low back pain without sciatica 11/01/2012   S/P laparoscopic appendectomy 07/28/2012   Migraines 09/24/2011   Hyperlipidemia 09/24/2011    ONSET DATE: 04/09/22   REFERRING DIAG: G93.89 (ICD-10-CM) - Brain mass   THERAPY DIAG:  Aphasia - Plan: SLP plan of care cert/re-cert  Cognitive communication deficit - Plan: SLP plan of care cert/re-cert  Rationale for Evaluation and Treatment Rehabilitation  SUBJECTIVE:   SUBJECTIVE STATEMENT: "I used to be called Viann Fish  - and - I intend - to get back to that. With my family - they called me Viann Fish." Pt accompanied by: self  PERTINENT HISTORY: The pt is a 66 yo female presenting 04/09/22 with AMS and slurred speech. CT showed: L frontal mass and is now s/p resection 04/11/22. PMH includes: HTN, HLD, and migraines.  SLE on 04/15/22 revealed: Pt presents with expressive more than receptive language impairment wtih speech intelligible. She was grossly accurate with simple yes/no questions and one-step commands, but had more significant difficulty once asked to perform two-step commands with some perseverative errors noted. She was 90% accurate with confrontational naming but initially not able to name any words in divergent naming task. SLP allowed pt to choose a category more relevant to personal interests and then she was able to produce 5 words across one minute. With additional semantic cues, she doubled this to 10. Pt does seem to have some awareness of her word-finding errors and bennefits from cues for word retrieval. Cognition will need ongoing assessment as language is addressed, but do note some repetitive questions asked during this evaluation. Recommend OP SLP and heavy supervision/assistance upon discharge.   PAIN:  Are you having pain? Yes: NPRS scale: 2/10 Pain location: head Pain description: headache  FALLS: Has patient fallen in last 6 months?  No  LIVING ENVIRONMENT: Lives with: lives with their daughter (high functioning autistic). 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  OBJECTIVE:   DIAGNOSTIC FINDINGS:  04/12/22 MRI HEAD WITH/WITHOUT CONTRAST: IMPRESSION: 1. Baseline postoperative study with expected postsurgical changes reflecting left frontal mass resection without definite  residual enhancement. 2. Residual edema around the resection cavity as well as extra-axial fluid, air, and blood products resulting in approximately 6 mm rightward midline shift, similar to the preoperative study. 3. Likely small amount of Peri cavity ischemia. No large vessel territorial infarct.  COGNITION: Overall cognitive status: Impaired Areas of impairment:  Executive function: Impaired: Organization, Planning, Error awareness, and Slow processing Behavior: Perseveration and pseudobulbar affect Functional deficits: Pt stated family is tracking her meds, filling med box, and tracking appointments at this time.   AUDITORY COMPREHENSION: Overall auditory comprehension: Impaired: complex Conversation: Complex and Moderately Complex were challenging for pt expressively but she appeared to understand SLP questions/statements Interfering components: attention, processing speed, and executive function Effective technique: extra processing time and slowed speech   READING COMPREHENSION: Intact  EXPRESSION: verbal  VERBAL EXPRESSION: Level of generative/spontaneous verbalization: conversation Repetition: Impaired: sentence likely due to reduced processing and/or attention Naming: Divergent: pt thought of 2 chores she does regularly in 60 seconds, and 4 items of furniture in 60 seconds. SLP suspects this is partly due to pt's decr'd executive function (organization). Pragmatics: Impaired: abnormal effect and dysprosody. Pt's pseudobulbar affect was distracting to this listener and her OT, as she produced childish-like speech -moreso with her son- and was occasionally tearful. Interfering components: attention Effective technique:  extra time Pt was unable to provide details when asked detailed questions by SLP like, "What were some of the benefits of running your own business?" SLP suspects this was mainly due to executive function/planning and organization deficts, however it greatly  negatively affected her verbal expression. When speaking about self-chosen topics and simple to simple-mod complex topics pt scored 8.90 overall ("no aphasia) on the QAB.  When speaking about SLP-led topics which were at least mod complex, pt scored 8.26 overall ("Mild Aphasia").   QAB overall score Severity 0.00-4.99  severe 5.00-7.49  moderate 7.50-8.89  mild 8.90-10.00  no aphasia   WRITTEN EXPRESSION: Dominant hand: right  Written expression: Appears intact - no written aphasia noted  MOTOR SPEECH: Overall motor speech: Appears intact  ORAL MOTOR EXAMINATION Overall status: WFL  STANDARDIZED ASSESSMENTS: QAB: Mild (with SLP-led topics which were at least mod complex)   PATIENT REPORTED OUTCOME MEASURES (PROM): Communication Participation Item Bank:   and Communication Effectiveness Survey: to be completed next session   TODAY'S TREATMENT:  Pt discussed the results of evaluation with pt and discussed homework SLP would like to see pt complete. SLP educated son about evaluation and homework, and encouraged family to supervise Brennyn getting her own meds and filling med box.   PATIENT EDUCATION: Education details: See above in "today's treatment" Person educated: Patient and Child(ren) Education method: Explanation and Handouts Education comprehension: verbalized understanding, returned demonstration, and needs further education   GOALS: Goals reviewed with patient? No  SHORT TERM GOALS: Target date: 05/17/2022    Pt will name 10 items in simple category (pertient to pt) in 60 seconds x3, in 3 sessions Baseline: Goal status: INITIAL  2.  Pt will tell SLP 3 compensations for verbal expression. Baseline:  Goal status: INITIAL  3.  In a mod complex topic, pt will achieve  at least 44 WPM in 3 sessions Baseline:  Goal status: INITIAL  4.  Pt will complete cognitive linguistic testing in first 2 sessions Baseline:  Goal status: INITIAL  5.  TBD Baseline:  Goal  status: INITIAL   LONG TERM GOALS: Target date: 07/12/2022    In 10 minutes mod complex conversation, pt will achieve at least 95 WPM in 3 sessions, using compensations Baseline:  Goal status: INITIAL  2.  Pt will tell SLP 3 compensations for verbal expression. Baseline:  Goal status: INITIAL  3.  In 10 minutes mod complex-complex conversation, pt will achieve at least 39 WPM in 3 sessions using compensations Baseline:  Goal status: INITIAL  4. Pt will not exhibit childish intonation in 10 minutes conversation in 3 sessions. Baseline:  Goal status: INITIAL  5.  TBD Baseline:  Goal status: INITIAL   ASSESSMENT:  CLINICAL IMPRESSION: Patient is a 66 y.o. female who was seen today for assessment of receptive and expressive language following resection of a brain tumor on 04/11/22. Bettye tells SLP she would like to regain her premorbid language skills as she is a mother and sole caregiver for her 56 year old high-functioning autistic daughter. Pt currently had much difficulty with mod complex and mod complex/complex topics of conversation likely due to decr'd executive function. Pragmatically, pt was perseverative and exhibited pseudobulbar affect (childish tone of voice, overly tearful) which was distracting for listener. Eleora would benefit from standardized cognitive linguistic testing. Pt does not know if she will need to undergo chemo or radiation at this time; She will find out Tuesday.  OBJECTIVE IMPAIRMENTS include executive functioning, expressive language, receptive language, and aphasia. These impairments are limiting patient from managing medications, managing appointments, managing finances, household responsibilities, and effectively communicating at home and in community. Factors affecting potential to achieve goals and functional outcome are medical prognosis. Patient will benefit from skilled SLP services to address above impairments and improve overall function.  REHAB  POTENTIAL: Fair depending on treatment plan if pt dx with brain cancer, or good  if pt does not need to undergo chemo or radiation.  PLAN: SLP FREQUENCY: 2x/week  SLP DURATION: 12 weeks  PLANNED INTERVENTIONS: Language facilitation, Environmental controls, Cueing hierachy, Cognitive reorganization, Internal/external aids, Functional tasks, Multimodal communication approach, SLP instruction and feedback, Compensatory strategies, and Patient/family education    [facility], CCC-SLP 04/20/2022, 12:11 AM

## 2022-04-18 NOTE — Therapy (Signed)
OUTPATIENT OCCUPATIONAL THERAPY NEURO EVALUATION  [name] MRN: [id number] DOB:[birth year], 66 y.o., female Today's Date: 04/18/2022  PCP: Wendie Agreste, MD REFERRING PROVIDER: Ashok Pall, MD    OT End of Session - 04/18/22 [phone]     Visit Number 1    Number of Visits 13    Date for OT Re-Evaluation 05/30/22    Authorization Type [employer] Medicare    OT Start Time 0848    OT Stop Time 0932    OT Time Calculation (min) 44 min    Activity Tolerance Patient tolerated treatment well    Behavior During Therapy WFL for tasks assessed/performed             Past Medical History:  Diagnosis Date   Hyperlipidemia    Hypertension    Migraine    Past Surgical History:  Procedure Laterality Date   APPENDECTOMY     APPLICATION OF CRANIAL NAVIGATION N/A 04/11/2022   Procedure: APPLICATION OF CRANIAL NAVIGATION;  Surgeon: Ashok Pall, MD;  Location: [place];  Service: Neurosurgery;  Laterality: N/A;   CRANIOTOMY Left 04/11/2022   Procedure: LEFT FRONTAL CRANIOTOMY FOR RESECTION OF TUMOR WITH Lucky Rathke;  Surgeon: Ashok Pall, MD;  Location: [place];  Service: Neurosurgery;  Laterality: Left;  RM 21   LAPAROSCOPIC APPENDECTOMY  07/15/2012   Procedure: APPENDECTOMY LAPAROSCOPIC;  Surgeon: Zenovia Jarred, MD;  Location: [place];  Service: General;  Laterality: N/A;  Laparoscopic Appendectomy   Patient Active Problem List   Diagnosis Date Noted   Brain tumor, 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[place]) 04/09/2022   Essential hypertension 06/13/2015   Bilateral chronic knee pain 10/14/2013   Chronic bilateral low back pain without sciatica 11/01/2012   S/P laparoscopic appendectomy 07/28/2012   Migraines 09/24/2011   Hyperlipidemia 09/24/2011    ONSET DATE: 04/09/22  REFERRING DIAG: G93.89 (ICD-10-CM) - Brain mass   THERAPY DIAG:  Muscle weakness (generalized)  Attention and concentration deficit  Unsteadiness on feet  Other symptoms and signs involving cognitive functions following  other cerebrovascular disease  Rationale for Evaluation and Treatment Rehabilitation  SUBJECTIVE:   SUBJECTIVE STATEMENT: Pt reports "it happened all of a sudden." Pt reports that she feels like she a little slower both physically and mentally. Pt accompanied by: self and son (remained in the lobby)  PERTINENT HISTORY: L frontal mass and is now s/p resection 8/18. PMH includes: HTN, HLD, and migraines.   PRECAUTIONS: Fall  WEIGHT BEARING RESTRICTIONS No  PAIN:  Are you having pain? Yes: NPRS scale: 2-3/10 Pain location: head Pain description: migraine pain Aggravating factors: unable to identify Relieving factors: medication  FALLS: Has patient fallen in last 6 months? No  LIVING ENVIRONMENT: Lives with: lives with their family and lives with adult daughter who has autism, adult son is staying with pt at this point to provide assistance as needed Lives in: Other town house Stairs: Yes: Internal: full flight downstairs to laundry, but she does not need to go down there at this time steps; on right going up and External: 3 steps; on right going up and can reach both Has following equipment at home: Gilford Rile - 2 wheeled and built in shower seat  PLOF: Independent, Independent with basic ADLs, Independent with household mobility without device, Independent with community mobility without device, Independent with homemaking with ambulation, and Independent with gait  PATIENT GOALS to be completely independent  OBJECTIVE:   HAND DOMINANCE: Right  ADLs: Overall ADLs: requires increased time to complete all ADLs, but is able  to complete at Mod I level to supervision when showering Eating: son is assisting by cutting foods Grooming: Mod I UB Dressing: Mod I LB Dressing: Mod I Toileting: Mod I Bathing: supervision from daughter during shower Tub Shower transfers: supervision from daughter during shower transfers Equipment: Walk in shower and built in seat in  shower   IADLs: Light housekeeping: family is assisting at this time Meal Prep: family is currently completing all meal prep Community mobility: not cleared to drive Medication management: family is dispensing meds Financial management: family is completing at this time Handwriting: Increased time  MOBILITY STATUS:  walking slower  POSTURE COMMENTS:  No Significant postural limitations  ACTIVITY TOLERANCE: Activity tolerance: WFL for tasks assessed during evaluation  UPPER EXTREMITY ROM: WFL for BUE   UPPER EXTREMITY MMT:   WFL bilaterally   HAND FUNCTION: Grip strength: Right: 42 lbs; Left: 46 lbs  COORDINATION: 9 Hole Peg test: Right: 23.15 sec; Left: 25.53 sec  SENSATION: WFL   COGNITION: Overall cognitive status: Within functional limits for tasks assessed BIMS: Oriented to day, month, and year  STM: able to recall sock, blue, bed  Recall (after 5 mins): able to recall sock, blue, bed  VISION: Subjective report: I stopped wearing my contacts because I felt that I had dry eye.  I went to the dr recently and I do have dry eye. Baseline vision: Bifocals and Wears glasses all the time Visual history:  dry eye  VISION ASSESSMENT: Ocular ROM: WFL Tracking/Visual pursuits: Able to track stimulus in all quads without difficulty Saccades: additional eye shifts occurred during testing   TODAY'S TREATMENT:  Discussed frontal lobe function and impairments.  Discussed safety concerns with driving at this time d/t frontal lobe impairment.     PATIENT EDUCATION: Education details: Educated on role and purpose of OT as well as potential interventions and goals for therapy based on initial evaluation findings. Person educated: Patient Education method: Explanation Education comprehension: verbalized understanding and needs further education   HOME EXERCISE PROGRAM: TBD    GOALS: Goals reviewed with patient? No  SHORT TERM GOALS: Target date: 05/09/22  Pt  will verbalize understanding of energy conservation techniques  Baseline: Goal status: INITIAL  2.  Pt will verbalize understanding of task modifications and/or potential A/E needs to increase ease, safety, and independence w/ ADLs and IADLs. Baseline:  Goal status: INITIAL  3.  Pt will demonstrate improved sequencing and problem solving as demonstrated by improvements in score on pill box assessment by reduction of errors to </= to 1. Baseline:  Goal status: INITIAL   LONG TERM GOALS: Target date: 05/30/22  Pt will demonstrate ability to sequence simple functional task (simple snack prep, laundry task, etc) at Mod I level with good safety awareness Baseline:  Goal status: INITIAL  2.  Pt will navigate a moderately busy environment, completing dual task activity and/or following multi-step commands with 90% accuracy Baseline:  Goal status: INITIAL  3.  Pt will complete table top task with improvements in safety and insight, problem solving and attention to increase independence with IADLs and possible return to driving PRN. Baseline: Goal status: INITIAL  4.  Pt will demonstrate improvements in cognition as demonstrated by improvements on SLUMS by from one category of impairment to lesser impairment. Baseline:  Goal status: INITIAL  5.  Pt will report understanding of return to driving recommendations Baseline:  Goal status: INITIAL   ASSESSMENT:  CLINICAL IMPRESSION: Patient is a 66 y.o. female who was seen today  for occupational therapy evaluation for cognitive impairments s/p L frontal mass resection impacting safety, insight, attention, safety awareness and problem solving during IADLs. Pt currently lives with adult daughter and adult son is staying with them to assist as needed.  Pt lives in a 2 story home with laundry in basement which he family is currently taking care of for her. PMHx includes HTN, HLD, and migraines. Pt will benefit from skilled occupational therapy  services to address  balance, cognition, safety awareness, introduction of compensatory strategies/AE prn, and implementation of an HEP to improve participation and safety during ADLs and IADLs.   PERFORMANCE DEFICITS in functional skills including ADLs, IADLs, balance, endurance, and decreased knowledge of precautions, cognitive skills including attention, emotional, problem solving, and thought, and psychosocial skills including interpersonal interactions.   IMPAIRMENTS are limiting patient from ADLs and IADLs.   COMORBIDITIES may have co-morbidities  that affects occupational performance. Patient will benefit from skilled OT to address above impairments and improve overall function.  MODIFICATION OR ASSISTANCE TO COMPLETE EVALUATION: Min-Moderate modification of tasks or assist with assess necessary to complete an evaluation.  OT OCCUPATIONAL PROFILE AND HISTORY: Detailed assessment: Review of records and additional review of physical, cognitive, psychosocial history related to current functional performance.  CLINICAL DECISION MAKING: Moderate - several treatment options, min-mod task modification necessary  REHAB POTENTIAL: Good  EVALUATION COMPLEXITY: Moderate    PLAN: OT FREQUENCY: 1-2x/week  OT DURATION: 6 weeks  PLANNED INTERVENTIONS: self care/ADL training, therapeutic activity, functional mobility training, patient/family education, cognitive remediation/compensation, coping strategies training, and DME and/or AE instructions  RECOMMENDED OTHER SERVICES: N/A  CONSULTED AND AGREED WITH PLAN OF CARE: Patient  PLAN FOR NEXT SESSION: Complete [place] and pill box assessment.   Simonne Come, OTR/L 04/18/2022, 12:31 PM

## 2022-04-19 NOTE — Therapy (Incomplete)
OUTPATIENT SPEECH LANGUAGE PATHOLOGY APHASIA EVALUATION   [name] MRN: [id number] DOB:[birth year], 66 y.o., female Today's Date: 04/18/2022  PCP: Wendie Agreste, MD REFERRING PROVIDER: Ashok Pall, MD     Past Medical History:  Diagnosis Date  . Hyperlipidemia   . Hypertension   . Migraine    Past Surgical History:  Procedure Laterality Date  . APPENDECTOMY    . APPLICATION OF CRANIAL NAVIGATION N/A 04/11/2022   Procedure: APPLICATION OF CRANIAL NAVIGATION;  Surgeon: Ashok Pall, MD;  Location: [place];  Service: Neurosurgery;  Laterality: N/A;  . CRANIOTOMY Left 04/11/2022   Procedure: LEFT FRONTAL CRANIOTOMY FOR RESECTION OF TUMOR WITH BRAINLAB;  Surgeon: Ashok Pall, MD;  Location: [place];  Service: Neurosurgery;  Laterality: Left;  RM 21  . LAPAROSCOPIC APPENDECTOMY  07/15/2012   Procedure: APPENDECTOMY LAPAROSCOPIC;  Surgeon: Zenovia Jarred, MD;  Location: [place];  Service: General;  Laterality: N/A;  Laparoscopic Appendectomy   Patient Active Problem List   Diagnosis Date Noted  . 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 Essential hypertension 06/13/2015  . Bilateral chronic knee pain 10/14/2013  . Chronic bilateral low back pain without sciatica 11/01/2012  . S/P laparoscopic appendectomy 07/28/2012  . Migraines 09/24/2011  . Hyperlipidemia 09/24/2011    ONSET DATE: 04/09/22   REFERRING DIAG: G93.89 (ICD-10-CM) - Brain mass   THERAPY DIAG:  No diagnosis found.  Rationale for Evaluation and Treatment Rehabilitation  SUBJECTIVE:   SUBJECTIVE STATEMENT: "I used to be called Jennifer [name] - and - I intend - to get back to that. With my family - they called me Jennifer [name]." Pt accompanied by: self  PERTINENT HISTORY: The pt is a 65 yo female presenting 04/09/22 with AMS and slurred speech. CT showed: L frontal mass and is now s/p resection 04/11/22. PMH includes: HTN, HLD, and migraines.  SLE on 04/15/22 revealed: Pt presents with  expressive more than receptive language impairment wtih speech intelligible. She was grossly accurate with simple yes/no questions and one-step commands, but had more significant difficulty once asked to perform two-step commands with some perseverative errors noted. She was 90% accurate with confrontational naming but initially not able to name any words in divergent naming task. SLP allowed pt to choose a category more relevant to personal interests and then she was able to produce 5 words across one minute. With additional semantic cues, she doubled this to 10. Pt does seem to have some awareness of her word-finding errors and bennefits from cues for word retrieval. Cognition will need ongoing assessment as language is addressed, but do note some repetitive questions asked during this evaluation. Recommend OP SLP and heavy supervision/assistance upon discharge.   PAIN:  Are you having pain? Yes: NPRS scale: 2/10 Pain location: head Pain description: headache  FALLS: Has patient fallen in last 6 months?  No  LIVING ENVIRONMENT: Lives with: lives with their daughter (high functioning autistic). 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  OBJECTIVE:   DIAGNOSTIC FINDINGS:  04/12/22 MRI HEAD WITH/WITHOUT CONTRAST: IMPRESSION: 1. Baseline postoperative study with expected postsurgical changes reflecting left frontal mass resection without definite residual enhancement. 2. Residual edema around the resection cavity as well as extra-axial fluid, air, and blood products resulting in approximately 6 mm rightward midline shift, similar to  the preoperative study. 3. Likely small amount of Peri cavity ischemia. No large vessel territorial infarct.  COGNITION: Overall cognitive status: Impaired Areas  of impairment:  Executive function: Impaired: Organization, Planning, Error awareness, and Slow processing Behavior: Perseveration and pseudobulbar affect Functional deficits: Pt stated family is tracking her meds, filling med box, and tracking appointments at this time.   AUDITORY COMPREHENSION: Overall auditory comprehension: Impaired: complex Conversation: Complex and Moderately Complex were challenging for pt expressively but she appeared to understand SLP questions/statements Interfering components: attention, processing speed, and executive function Effective technique: extra processing time and slowed speech   READING COMPREHENSION: Intact  EXPRESSION: verbal  VERBAL EXPRESSION: Level of generative/spontaneous verbalization: conversation Repetition: Impaired: sentence likely due to reduced processing and/or attention Naming: Divergent: pt thought of 2 chores she does regularly in 60 seconds, and 4 items of furniture in 60 seconds. SLP suspects this is partly due to pt's decr'd executive function (organization). Pragmatics: Impaired: abnormal effect and dysprosody. Pt's pseudobulbar affect was distracting to this listener and her OT, as she produced childish-like speech -moreso with her son- and was occasionally tearful. Interfering components: attention Effective technique:  extra time Pt was unable to provide details when asked detailed questions by SLP like, "What were some of the benefits of running your own business?" SLP suspects this was mainly due to executive function/planning and organization deficts, however it greatly negatively affected her verbal expression. When speaking about self-chosen topics and simple to simple-mod complex topics pt scored 8.90 overall ("no aphasia) on the QAB.  When speaking about SLP-led topics which were at least mod complex, pt scored 8.26 overall ("Mild Aphasia").   QAB overall  score Severity 0.00-4.99  severe 5.00-7.49  moderate 7.50-8.89  mild 8.90-10.00  no aphasia   WRITTEN EXPRESSION: Dominant hand: right  Written expression: Appears intact - no written aphasia noted  MOTOR SPEECH: Overall motor speech: Appears intact  ORAL MOTOR EXAMINATION Overall status: WFL  STANDARDIZED ASSESSMENTS: QAB: Mild (with SLP-led topics which were at least mod complex)   PATIENT REPORTED OUTCOME MEASURES (PROM): Communication Participation Item Bank:   and Communication Effectiveness Survey: to be completed next session   TODAY'S TREATMENT:  Pt discussed the results of evaluation with pt and discussed homework SLP would like to see pt complete. SLP educated son about evaluation and homework, and encouraged family to supervise Jennifer [name] getting her own meds and filling med box.   PATIENT EDUCATION: Education details: See above in "today's treatment" Person educated: Patient and Child(ren) Education method: Explanation and Handouts Education comprehension: verbalized understanding, returned demonstration, and needs further education   GOALS: Goals reviewed with patient? No  SHORT TERM GOALS: Target date: 05/17/2022    Pt will name 10 items in simple category (pertient to pt) in 60 seconds x3, in 3 sessions Baseline: Goal status: INITIAL  2.  Pt will tell SLP 3 compensations for verbal expression. Baseline:  Goal status: INITIAL  3.  In a mod complex topic, pt will achieve at least 70 WPM in 3 sessions Baseline:  Goal status: INITIAL  4.  Pt will complete cognitive linguistic testing in first 2 sessions Baseline:  Goal status: INITIAL  5.  TBD Baseline:  Goal status: INITIAL   LONG TERM GOALS: Target date: 07/12/2022    In 10 minutes mod complex conversation, pt will achieve at least 95 WPM in 3 sessions, using compensations Baseline:  Goal status: INITIAL  2.  Pt will tell SLP 3 compensations for verbal expression. Baseline:  Goal  status:  INITIAL  3.  In 10 minutes mod complex-complex conversation, pt will achieve at least 25 WPM in 3 sessions using compensations Baseline:  Goal status: INITIAL  4. Pt will not exhibit childish intonation in 10 minutes conversation in 3 sessions. Baseline:  Goal status: INITIAL  5.  TBD Baseline:  Goal status: INITIAL   ASSESSMENT:  CLINICAL IMPRESSION: Patient is a 66 y.o. female who was seen today for assessment of receptive and expressive language following resection of a brain tumor on 04/11/22. 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 Pt currently had much difficulty with mod complex and mod complex/complex topics of conversation likely due to decr'd executive function. Pragmatically, pt was perseverative and exhibited pseudobulbar affect (childish tone of voice, overly tearful) which was distracting for listener. Pt doe  OBJECTIVE IMPAIRMENTS include executive functioning, expressive language, receptive language, and aphasia. These impairments are limiting patient from managing medications, managing appointments, managing finances, household responsibilities, and effectively communicating at home and in community. Factors affecting potential to achieve goals and functional outcome are medical prognosis. Patient will benefit from skilled SLP services to address above impairments and improve overall function.  REHAB POTENTIAL: {rehabpotential:25112}  PLAN: SLP FREQUENCY: {rehab frequency:25116}  SLP DURATION: {rehab duration:25117}  PLANNED INTERVENTIONS: {SLP treatment/interventions:25449}    Denaly Gatling, CCC-SLP 04/18/2022, 10:17 AM

## 2022-04-22 LAB — SURGICAL PATHOLOGY

## 2022-04-22 NOTE — Therapy (Signed)
OUTPATIENT PHYSICAL THERAPY NEURO TREATMENT   [name] MRN: [id number] DOB:1956/02/11, 66 y.o., female Today's Date: 04/23/2022   PCP: Wendie Agreste, MD REFERRING PROVIDER: Ashok Pall, MD   PT End of Session - 04/23/22 1311     Visit Number 2    Number of Visits 9    Date for PT Re-Evaluation 05/16/22    Authorization Type BCBS Medicare    PT Start Time 1232    PT Stop Time 1310    PT Time Calculation (min) 38 min    Equipment Utilized During Treatment Gait belt    Activity Tolerance Patient tolerated treatment well    Behavior During Therapy WFL for tasks assessed/performed              Past Medical History:  Diagnosis Date   Hyperlipidemia    Hypertension    Migraine    Past Surgical History:  Procedure Laterality Date   APPENDECTOMY     APPLICATION OF CRANIAL NAVIGATION N/A 04/11/2022   Procedure: APPLICATION OF CRANIAL NAVIGATION;  Surgeon: Ashok Pall, MD;  Location: [place];  Service: Neurosurgery;  Laterality: N/A;   CRANIOTOMY Left 04/11/2022   Procedure: LEFT FRONTAL CRANIOTOMY FOR RESECTION OF TUMOR WITH Lucky Rathke;  Surgeon: Ashok Pall, MD;  Location: [place];  Service: Neurosurgery;  Laterality: Left;  RM 21   LAPAROSCOPIC APPENDECTOMY  07/15/2012   Procedure: APPENDECTOMY LAPAROSCOPIC;  Surgeon: Zenovia Jarred, MD;  Location: [place];  Service: General;  Laterality: N/A;  Laparoscopic Appendectomy   Patient Active Problem List   Diagnosis Date Noted   Brain tumor, 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[place]) 04/09/2022   Essential hypertension 06/13/2015   Bilateral chronic knee pain 10/14/2013   Chronic bilateral low back pain without sciatica 11/01/2012   S/P laparoscopic appendectomy 07/28/2012   Migraines 09/24/2011   Hyperlipidemia 09/24/2011    ONSET DATE: 04/09/22  REFERRING DIAG:  G93.89 (ICD-10-CM) - Brain mass  THERAPY DIAG:  Unsteadiness on feet  Muscle weakness (generalized)  Rationale for Evaluation and Treatment  Rehabilitation  SUBJECTIVE:                                                                                                                                                                                              SUBJECTIVE STATEMENT: Has had good days and bad days.   Pt accompanied by: self  PERTINENT HISTORY: HTN, HLD, and migraines  PAIN:  Are you having pain? Yes: NPRS scale: 3/10 Pain location: L frontal head Pain description: headache Aggravating factors: loud sounds Relieving factors: dark room  PRECAUTIONS: Fall  PATIENT GOALS "I want to get  out of here as soon as possible."  OBJECTIVE:      TODAY'S TREATMENT: 04/23/22 Activity Comments  Nustep L5 x 5 min UE/Les  Good pace  gait training with SPC 279f Cueing for proper AD sequencing and maintaining cane far enough away from BOS   Bridge 10x  Cues for rhythmic breathing   bridge ball 10x  Cues to count reps to assist with dual task  STS 10x without Ues, 10x with green medball  Cues to count reps to assist with dual task; good form and technique   marching with yellow TB 2x20 Good amplitude   hip ABD with yellowTB 2x10  Cues to slow down and define movement  hip EX TB with yellowTB 2x10  Good posture       Below measures were taken at time of initial evaluation unless otherwise specified:  DIAGNOSTIC FINDINGS: brain MRI: 04/09/22: 5.4 x 4.0 cm peripherally contrast-enhancing mass centered in the left frontal lobe with surrounding vasogenic edema, most consistent with a high-grade glioma. Rightward midline shift of 5 mm with impending herniation of the left cingulate gyrus.  COGNITION: Overall cognitive status:  good attention and focus during conversation; occasionally inappropriate laughter    SENSATION: WFL  COORDINATION: Alt supination/pronation, alt toe tap, finger to nose intact B   MUSCLE TONE: intact B   POSTURE: No Significant postural limitations  LOWER EXTREMITY ROM:     Active   Right Eval Left Eval  Hip flexion    Hip extension    Hip abduction    Hip adduction    Hip internal rotation    Hip external rotation    Knee flexion    Knee extension    Ankle dorsiflexion 10 7  Ankle plantarflexion    Ankle inversion    Ankle eversion     (Blank rows = not tested)  LOWER EXTREMITY MMT:    MMT (in sitting) Right Eval Left Eval  Hip flexion 4 4+  Hip extension    Hip abduction 4 4  Hip adduction 4- 4-  Hip internal rotation    Hip external rotation    Knee flexion 4+ 4  Knee extension 4+ 5  Ankle dorsiflexion 4+ 4+  Ankle plantarflexion 4+ 4+  Ankle inversion    Ankle eversion    (Blank rows = not tested)  GAIT: Gait pattern:  wide BOS and with short slightly unsteady steps  Assistive device utilized: None Level of assistance: CGA   FUNCTIONAL TESTs:      PATIENT EDUCATION: Education details: prognosis, POC, HEP Person educated: Patient Education method: EConsulting civil engineer Demonstration, Tactile cues, Verbal cues, and Handouts Education comprehension: verbalized understanding   HOME EXERCISE PROGRAM: Access Code: [email or web address] URL: [email or web address] Date: 04/18/2022 [facility]: [facility]  Exercises - Sit to Stand  - 1 x daily - 5 x weekly - 2 sets - 10 reps - Marching with Resistance  - 1 x daily - 5 x weekly - 2 sets - 10 reps - Standing Hip Abduction with Resistance at Ankles and Counter Support  - 1 x daily - 5 x weekly - 2 sets - 10 reps - Alternating Step Backward with Support  - 1 x daily - 5 x weekly - 2 sets - 10 reps   GOALS: Goals reviewed with patient? Yes  SHORT TERM GOALS: Target date: 05/02/2022  Patient to be independent with initial HEP. Baseline: HEP initiated Goal status: IN PROGRESS    LONG  TERM GOALS: Target date: 05/16/2022  Patient to be independent with advanced HEP. Baseline: Not yet initiated  Goal status: IN PROGRESS  Patient to demonstrate B LE  strength >/=4+/5.  Baseline: See above Goal status: IN PROGRESS   Patient to score at least 20/24 on DGI in order to decrease risk of falls.  Baseline: 18/24 Goal status: IN PROGRESS  Patient to complete TUG in <14 sec with LRAD in order to decrease risk of falls.   Baseline: 17 sec Goal status: IN PROGRESS  Patient to demonstrate 5xSTS test in <15 sec in order to decrease risk of falls.  Baseline: 21 sec Goal status: IN PROGRESS  Patient to report tolerance for 1 hour of housework without fatigue limiting.  Baseline: unable Goal status: IN PROGRESS    ASSESSMENT:  CLINICAL IMPRESSION:  Patient arrived to session with report of mild remaining HA. Worked on Personnel officer with SPC with cueing for AD sequencing and proper cane placement; patient appeared quite steady and caught on quickly. Reported some lightheadedness upon sitting up from supine which improved with sitting water break. STS transfers were performed with good form and technique; able to tolerate additional weight today. Encouraged patient to keep count with repetitions of exercises to assist in dual tasking- patient was able to keep attention on this task with certain activities. Patient tolerated session well and without complaints at end of session.    OBJECTIVE IMPAIRMENTS Abnormal gait, decreased balance, decreased cognition, decreased endurance, decreased knowledge of use of DME, decreased strength, and pain.   ACTIVITY LIMITATIONS carrying, lifting, bending, sitting, standing, squatting, sleeping, stairs, dressing, hygiene/grooming, and caring for others  PARTICIPATION LIMITATIONS: meal prep, cleaning, laundry, driving, shopping, community activity, and church  PERSONAL FACTORS Age, Behavior pattern, Past/current experiences, Time since onset of injury/illness/exacerbation, and 3+ comorbidities: HTN, HLD, and migraines  are also affecting patient's functional outcome.   REHAB POTENTIAL: Good  CLINICAL  DECISION MAKING: Evolving/moderate complexity  EVALUATION COMPLEXITY: Moderate  PLAN: PT FREQUENCY: 2x/week  PT DURATION: 4 weeks  PLANNED INTERVENTIONS: Therapeutic exercises, Therapeutic activity, Neuromuscular re-education, Balance training, Gait training, Patient/Family education, Self Care, Joint mobilization, Stair training, Vestibular training, Canalith repositioning, DME instructions, Aquatic Therapy, Dry Needling, Electrical stimulation, Cryotherapy, Moist heat, Taping, Manual therapy, and Re-evaluation  PLAN FOR NEXT SESSION:  progress dynamic balance activities; continue gait training with [place] for improved safety and confidence with walking    Janene Harvey, PT, DPT 04/23/22 1:12 PM  [facility] [address] [place], [place] [address] Phone # [phone] Fax # [phone]

## 2022-04-23 ENCOUNTER — Encounter: Payer: Self-pay | Admitting: Physical Therapy

## 2022-04-23 ENCOUNTER — Ambulatory Visit: Payer: Medicare Other | Admitting: Occupational Therapy

## 2022-04-23 ENCOUNTER — Ambulatory Visit: Payer: Medicare Other | Admitting: Physical Therapy

## 2022-04-23 ENCOUNTER — Encounter (HOSPITAL_COMMUNITY): Payer: Self-pay

## 2022-04-23 ENCOUNTER — Ambulatory Visit: Payer: Medicare Other

## 2022-04-23 DIAGNOSIS — I69818 Other symptoms and signs involving cognitive functions following other cerebrovascular disease: Secondary | ICD-10-CM

## 2022-04-23 DIAGNOSIS — R4701 Aphasia: Secondary | ICD-10-CM

## 2022-04-23 DIAGNOSIS — M6281 Muscle weakness (generalized): Secondary | ICD-10-CM | POA: Diagnosis not present

## 2022-04-23 DIAGNOSIS — R2681 Unsteadiness on feet: Secondary | ICD-10-CM

## 2022-04-23 DIAGNOSIS — R41840 Attention and concentration deficit: Secondary | ICD-10-CM

## 2022-04-23 DIAGNOSIS — R41841 Cognitive communication deficit: Secondary | ICD-10-CM

## 2022-04-23 NOTE — Therapy (Signed)
[facility]  Treatment Session  [name] MRN: [id number] DOB:May 01, 1956, 66 y.o., female Today's Date: 04/23/2022  PCP: Wendie Agreste, MD REFERRING PROVIDER: Ashok Pall, MD    OT End of Session - 04/23/22 1413     Visit Number 2    Number of Visits 13    Date for OT Re-Evaluation 05/30/22    Authorization Type [employer] Medicare    OT Start Time 1148    OT Stop Time 1232    OT Time Calculation (min) 44 min    Activity Tolerance Patient tolerated treatment well    Behavior During Therapy WFL for tasks assessed/performed              Past Medical History:  Diagnosis Date   Hyperlipidemia    Hypertension    Migraine    Past Surgical History:  Procedure Laterality Date   APPENDECTOMY     APPLICATION OF CRANIAL NAVIGATION N/A 04/11/2022   Procedure: APPLICATION OF CRANIAL NAVIGATION;  Surgeon: Ashok Pall, MD;  Location: [place];  Service: Neurosurgery;  Laterality: N/A;   CRANIOTOMY Left 04/11/2022   Procedure: LEFT FRONTAL CRANIOTOMY FOR RESECTION OF TUMOR WITH Lucky Rathke;  Surgeon: Ashok Pall, MD;  Location: [place];  Service: Neurosurgery;  Laterality: Left;  RM 21   LAPAROSCOPIC APPENDECTOMY  07/15/2012   Procedure: APPENDECTOMY LAPAROSCOPIC;  Surgeon: Zenovia Jarred, MD;  Location: [place];  Service: General;  Laterality: N/A;  Laparoscopic Appendectomy   Patient Active Problem List   Diagnosis Date Noted   Brain tumor, 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[place]) 04/09/2022   Essential hypertension 06/13/2015   Bilateral chronic knee pain 10/14/2013   Chronic bilateral low back pain without sciatica 11/01/2012   S/P laparoscopic appendectomy 07/28/2012   Migraines 09/24/2011   Hyperlipidemia 09/24/2011    ONSET DATE: 04/09/22  REFERRING DIAG: G93.89 (ICD-10-CM) - Brain mass   THERAPY DIAG:  Muscle weakness (generalized)  Attention and concentration deficit  Other symptoms and signs involving cognitive functions following other  cerebrovascular disease  Rationale for Evaluation and Treatment Rehabilitation  SUBJECTIVE:   SUBJECTIVE STATEMENT: Pt reports son is continuing to fill pill box and supervise taking of medication. Pt accompanied by: self and son (remained in the lobby)  PERTINENT HISTORY: L frontal mass and is now s/p resection 8/18. PMH includes: HTN, HLD, and migraines.   PRECAUTIONS: Fall  WEIGHT BEARING RESTRICTIONS No  PAIN:  Are you having pain? Yes: NPRS scale: 2-3/10 Pain location: head Pain description: migraine pain Aggravating factors: unable to identify Relieving factors: medication  FALLS: Has patient fallen in last 6 months? No  LIVING ENVIRONMENT: Lives with: lives with their family and lives with adult daughter who has autism, adult son is staying with pt at this point to provide assistance as needed Lives in: Other town house Stairs: Yes: Internal: full flight downstairs to laundry, but she does not need to go down there at this time steps; on right going up and External: 3 steps; on right going up and can reach both Has following equipment at home: Environmental consultant - 2 wheeled and built in shower seat  PLOF: Independent, Independent with basic ADLs, Independent with household mobility without device, Independent with community mobility without device, Independent with homemaking with ambulation, and Independent with gait  PATIENT GOALS to be completely independent  OBJECTIVE:   HAND DOMINANCE: Right  ADLs: Overall ADLs: requires increased time to complete all ADLs, but is able to complete at Mod I level to supervision when  showering Eating: son is assisting by cutting foods Grooming: Mod I UB Dressing: Mod I LB Dressing: Mod I Toileting: Mod I Bathing: supervision from daughter during shower Tub Shower transfers: supervision from daughter during shower transfers Equipment: Walk in shower and built in seat in shower   IADLs: Light housekeeping: family is assisting at this  time Meal Prep: family is currently completing all meal prep Community mobility: not cleared to drive Medication management: family is dispensing meds Financial management: family is completing at this time Handwriting: Increased time  MOBILITY STATUS:  walking slower   COGNITION: Overall cognitive status: Within functional limits for tasks assessed BIMS: Oriented to day, month, and year  STM: able to recall sock, blue, bed  Recall (after 5 mins): able to recall sock, blue, bed  --------------------------------------------------------------------------------------------------------------------------------------------------------  TODAY'S TREATMENT:  SLUMS: Pt scoring 16/30.  Pt requiring increased time to complete mental math without errors, however pt unable to identify >2 animals in 1 minute time limit.  Pt with decreased recall of details during story recall and errors with clock drawing portion.   Pill box assessment: 18 omission errors (one pill 3x/day was only placed in one day slot, pt omitted the remaining 6 days/week only on the one "prescription").  Educated on functional carryover and progression towards resuming task. Pt's son is currently filling pill box and this is still recommended, however therapist encouraging pt to be an active part when he is filling pill box to then progress to her filling with his supervision. Engaged in small peg board pattern replication with use of key to identify correlating colors, challenging alternating attention, visual perception, and problem solving.  Utilized pattern with corresponding first letter of color in grid with pt able to complete with increased time, however on errors. Pt initially scanning in linear fashion, however as task continued pt demonstrating decreased organization of scanning.  Therapist cued pt for sequential scanning whether vertical or horizontal - pt then with vertical preference.  Pt omitting 2 pegs, requiring cues to  scan to locate missing pegs in pattern.  Pt then able to complete.    Educated on functional carryover of task implications with systematic process to minimize onset of errors.   PATIENT EDUCATION: Education details: reviewed results on SLUMS and concerns with medication management; discussed progress Person educated: Patient Education method: Explanation Education comprehension: verbalized understanding and needs further education   HOME EXERCISE PROGRAM: TBD    GOALS: Goals reviewed with patient? Yes  SHORT TERM GOALS: Target date: 05/09/22  Pt will verbalize understanding of energy conservation techniques  Baseline: Goal status: IN PROGRESS  2.  Pt will verbalize understanding of task modifications and/or potential A/E needs to increase ease, safety, and independence w/ ADLs and IADLs. Baseline:  Goal status: IN PROGRESS  3.  Pt will demonstrate improved sequencing and problem solving as demonstrated by improvements in score on pill box assessment by reduction of errors to </= to 1. Baseline:  Goal status: IN PROGRESS   LONG TERM GOALS: Target date: 05/30/22  Pt will demonstrate ability to sequence simple functional task (simple snack prep, laundry task, etc) at Mod I level with good safety awareness Baseline:  Goal status: IN PROGRESS  2.  Pt will navigate a moderately busy environment, completing dual task activity and/or following multi-step commands with 90% accuracy Baseline:  Goal status: IN PROGRESS  3.  Pt will complete table top task with improvements in safety and insight, problem solving and attention to increase independence with IADLs and  possible return to driving PRN. Baseline: Goal status: IN PROGRESS  4.  Pt will demonstrate improvements in cognition as demonstrated by improvements on SLUMS by from one category of impairment to lesser impairment. Baseline:  Goal status: IN PROGRESS  5.  Pt will report understanding of return to driving  recommendations Baseline:  Goal status: IN PROGRESS   ASSESSMENT:  CLINICAL IMPRESSION: Pt seen for first therapy session s/p initial evaluation.  Therapist reviewed goals with pt, pt in agreement.  Therapist completed SLUMS with pt demonstrating significant impairments and requiring increased time with recall and word finding. Engaged in [place] box assessment with pt making 18 omission errors, however all errors from same medication.  Reviewed recommendations for son to continue with medication dispensing/filling pill box and to increase engagement in task little by little.  PERFORMANCE DEFICITS in functional skills including ADLs, IADLs, balance, endurance, and decreased knowledge of precautions, cognitive skills including attention, emotional, problem solving, and thought, and psychosocial skills including interpersonal interactions.   IMPAIRMENTS are limiting patient from ADLs and IADLs.   COMORBIDITIES may have co-morbidities  that affects occupational performance. Patient will benefit from skilled OT to address above impairments and improve overall function.  MODIFICATION OR ASSISTANCE TO COMPLETE EVALUATION: Min-Moderate modification of tasks or assist with assess necessary to complete an evaluation.  OT OCCUPATIONAL PROFILE AND HISTORY: Detailed assessment: Review of records and additional review of physical, cognitive, psychosocial history related to current functional performance.  CLINICAL DECISION MAKING: Moderate - several treatment options, min-mod task modification necessary  REHAB POTENTIAL: Good  EVALUATION COMPLEXITY: Moderate    PLAN: OT FREQUENCY: 1-2x/week  OT DURATION: 6 weeks  PLANNED INTERVENTIONS: self care/ADL training, therapeutic activity, functional mobility training, patient/family education, cognitive remediation/compensation, coping strategies training, and DME and/or AE instructions  RECOMMENDED OTHER SERVICES: N/A  CONSULTED AND AGREED WITH PLAN OF  CARE: Patient  PLAN FOR NEXT SESSION: working memory, alternating attention, engagement in IADLs   Villard, [place], OTR/L 04/23/2022, 2:14 PM

## 2022-04-23 NOTE — Therapy (Signed)
OUTPATIENT SPEECH LANGUAGE PATHOLOGY TREATMENT NOTE   [name] MRN: [id number] DOB:1956/05/18, 66 y.o., female Today's Date: 04/23/2022  PCP: Wendie Agreste, MD REFERRING PROVIDER: Ashok Pall, MD    End of Session - 04/23/22 1232     Visit Number 2    Number of Visits 25    Date for SLP Re-Evaluation 07/17/22    SLP Start Time 1103    SLP Stop Time  1145    SLP Time Calculation (min) 42 min    Activity Tolerance Patient tolerated treatment well              Past Medical History:  Diagnosis Date   Hyperlipidemia    Hypertension    Migraine    Past Surgical History:  Procedure Laterality Date   APPENDECTOMY     APPLICATION OF CRANIAL NAVIGATION N/A 04/11/2022   Procedure: APPLICATION OF CRANIAL NAVIGATION;  Surgeon: Ashok Pall, MD;  Location: [place];  Service: Neurosurgery;  Laterality: N/A;   CRANIOTOMY Left 04/11/2022   Procedure: LEFT FRONTAL CRANIOTOMY FOR RESECTION OF TUMOR WITH Lucky Rathke;  Surgeon: Ashok Pall, MD;  Location: [place];  Service: Neurosurgery;  Laterality: Left;  RM 21   LAPAROSCOPIC APPENDECTOMY  07/15/2012   Procedure: APPENDECTOMY LAPAROSCOPIC;  Surgeon: Zenovia Jarred, MD;  Location: [place];  Service: General;  Laterality: N/A;  Laparoscopic Appendectomy   Patient Active Problem List   Diagnosis Date Noted   Brain tumor, glioma ([place]) 04/09/2022   Essential hypertension 06/13/2015   Bilateral chronic knee pain 10/14/2013   Chronic bilateral low back pain without sciatica 11/01/2012   S/P laparoscopic appendectomy 07/28/2012   Migraines 09/24/2011   Hyperlipidemia 09/24/2011    ONSET DATE: 04/09/22   REFERRING DIAG: G93.89 (ICD-10-CM) - Brain mass   THERAPY DIAG:  Aphasia  Cognitive communication deficit  Rationale for Evaluation and Treatment Rehabilitation  SUBJECTIVE:   SUBJECTIVE STATEMENT: "I'm doing pretty good. At least that's what I'm telling myself." Pt accompanied by: self  PERTINENT  HISTORY: The pt is a 66 yo female presenting 04/09/22 with AMS and slurred speech. CT showed: L frontal mass and is now s/p resection 04/11/22. PMH includes: HTN, HLD, and migraines.  SLE on 04/15/22 revealed: Pt presents with expressive more than receptive language impairment wtih speech intelligible. She was grossly accurate with simple yes/no questions and one-step commands, but had more significant difficulty once asked to perform two-step commands with some perseverative errors noted. She was 90% accurate with confrontational naming but initially not able to name any words in divergent naming task. SLP allowed pt to choose a category more relevant to personal interests and then she was able to produce 5 words across one minute. With additional semantic cues, she doubled this to 10. Pt does seem to have some awareness of her word-finding errors and bennefits from cues for word retrieval. Cognition will need ongoing assessment as language is addressed, but do note some repetitive questions asked during this evaluation. Recommend OP SLP and heavy supervision/assistance upon discharge.   PAIN:  Are you having pain? Yes: NPRS scale: 2/10 Pain location: head Pain description: headache  PATIENT GOALS Pt would like to return to WNL speech rate and ability.  OBJECTIVE:   DIAGNOSTIC FINDINGS:  04/12/22 MRI HEAD WITH/WITHOUT CONTRAST: IMPRESSION: 1. Baseline postoperative study with expected postsurgical changes reflecting left frontal mass resection without definite residual enhancement. 2. Residual edema around the resection cavity as well as extra-axial fluid, air, and blood products resulting in  approximately 6 mm rightward midline shift, similar to the preoperative study. 3. Likely small amount of Peri cavity ischemia. No large vessel territorial infarct.   PATIENT REPORTED OUTCOME MEASURES (PROM): Communication Participation Item Bank and Communication Effectiveness Survey were completed  today.Communication Participation Item Bank: (CPIVB) scored 21/30 (higher scores indicate better QOL). and Communication Effectiveness Survey: (CES) scored  28/32 - lowest response with "speaking to a friend when you are emotionally upset or angry" 2/4, and 3/4 with "conversing with a stranger over the telephone" and "having a conversation with someone at a distance"   TODAY'S TREATMENT:  04/23/22: SLP provided pt CPIB and CES, scores are above. Pt named 11/14 [place] teams, and 4/4 [place] teams. When asked to describe her vacuum (husband was Aeronautical engineer) pt perseverated and generated vague speech. Homework to tell SLP current market price for her [employer] vacuums.  04/20/22 (eval): SLP discussed the results of evaluation with pt and discussed homework SLP would like to see pt complete. SLP educated son about evaluation and homework, and encouraged family to supervise [name] getting her own meds and filling med box.   PATIENT EDUCATION: Education details: See above in "today's treatment" Person educated: Patient and Child(ren) Education method: Explanation and Handouts Education comprehension: verbalized understanding, returned demonstration, and needs further education   GOALS: Goals reviewed with patient? No  SHORT TERM GOALS: Target date: 05/17/2022    Pt will name 10 items in simple category (pertient to pt) in 60 seconds x3, in 3 sessions Baseline: Goal status: Ongoing  2.  Pt will tell SLP 3 compensations for verbal expression. Baseline:  Goal status: Ongoing  3.  In a mod complex topic, pt will achieve at least 70 WPM in 3 sessions Baseline:  Goal status: Ongoing  4.  Pt will complete cognitive linguistic testing in first 2 sessions Baseline:  Goal status: Ongoing  5.  TBD Baseline:  Goal status: Ongoing   LONG TERM GOALS: Target date: 07/12/2022    In 10 minutes mod complex conversation, pt will achieve at least 95 WPM in 3 sessions, using  compensations Baseline:  Goal status: Ongoing  2.  Pt will tell SLP 3 compensations for verbal expression. Baseline:  Goal status: Ongoing  3.  In 10 minutes mod complex-complex conversation, pt will achieve at least 26 WPM in 3 sessions using compensations Baseline:  Goal status: Ongoing  4. Pt will not exhibit childish intonation in 10 minutes conversation in 3 sessions. Baseline:  Goal status: Ongoing  5.  TBD Baseline:  Goal status: Ongoing   ASSESSMENT:  CLINICAL IMPRESSION: Patient is a 66 y.o. female who was seen today for treatment of receptive and expressive language following resection of a brain tumor on 04/11/22. She is a mother and sole caregiver for her 75 year old high-functioning autistic daughter. Pt cont to have difficulty with mod complex and mod complex/complex topics of conversation likely due to decr'd executive function. Pragmatically, pt was perseverative and exhibited pseudobulbar affect (childish tone of voice, overly tearful) which was distracting for listener. Yari would benefit from standardized cognitive linguistic testing. Pt does not know if she will need to undergo chemo or radiation at this time; She will find out Tuesday.  OBJECTIVE IMPAIRMENTS include executive functioning, expressive language, receptive language, and aphasia. These impairments are limiting patient from managing medications, managing appointments, managing finances, household responsibilities, and effectively communicating at home and in community. Factors affecting potential to achieve goals and functional outcome are medical prognosis. Patient will benefit from  skilled SLP services to address above impairments and improve overall function.  REHAB POTENTIAL: Fair depending on treatment plan if pt dx with brain cancer, or good  if pt does not need to undergo chemo or radiation.  PLAN: SLP FREQUENCY: 2x/week  SLP DURATION: 12 weeks  PLANNED INTERVENTIONS: Language facilitation,  Environmental controls, Cueing hierachy, Cognitive reorganization, Internal/external aids, Functional tasks, Multimodal communication approach, SLP instruction and feedback, Compensatory strategies, and Patient/family education    [facility], [place] 04/23/2022, 12:32 PM

## 2022-04-25 ENCOUNTER — Ambulatory Visit: Payer: Medicare Other | Attending: Neurosurgery | Admitting: Physical Therapy

## 2022-04-25 ENCOUNTER — Ambulatory Visit: Payer: Medicare Other

## 2022-04-25 ENCOUNTER — Encounter: Payer: Self-pay | Admitting: Physical Therapy

## 2022-04-25 DIAGNOSIS — R41841 Cognitive communication deficit: Secondary | ICD-10-CM

## 2022-04-25 DIAGNOSIS — M6281 Muscle weakness (generalized): Secondary | ICD-10-CM | POA: Diagnosis present

## 2022-04-25 DIAGNOSIS — R41840 Attention and concentration deficit: Secondary | ICD-10-CM | POA: Insufficient documentation

## 2022-04-25 DIAGNOSIS — R4701 Aphasia: Secondary | ICD-10-CM

## 2022-04-25 DIAGNOSIS — R2681 Unsteadiness on feet: Secondary | ICD-10-CM | POA: Insufficient documentation

## 2022-04-25 DIAGNOSIS — I69818 Other symptoms and signs involving cognitive functions following other cerebrovascular disease: Secondary | ICD-10-CM | POA: Insufficient documentation

## 2022-04-25 NOTE — Therapy (Signed)
OUTPATIENT SPEECH LANGUAGE PATHOLOGY TREATMENT NOTE   [name] MRN: [id number] DOB:10-17-1955, 66 y.o., female Today's Date: 04/28/2022  PCP: Wendie Agreste, MD REFERRING PROVIDER: Ashok Pall, MD    End of Session - 04/28/22 1123     Visit Number 3    Number of Visits 25    Date for SLP Re-Evaluation 07/17/22    SLP Start Time 31    SLP Stop Time  1100    SLP Time Calculation (min) 40 min    Activity Tolerance Patient tolerated treatment well               Past Medical History:  Diagnosis Date   Hyperlipidemia    Hypertension    Migraine    Past Surgical History:  Procedure Laterality Date   APPENDECTOMY     APPLICATION OF CRANIAL NAVIGATION N/A 04/11/2022   Procedure: APPLICATION OF CRANIAL NAVIGATION;  Surgeon: Ashok Pall, MD;  Location: [facility];  Service: Neurosurgery;  Laterality: N/A;   CRANIOTOMY Left 04/11/2022   Procedure: LEFT FRONTAL CRANIOTOMY FOR RESECTION OF TUMOR WITH Lucky Rathke;  Surgeon: Ashok Pall, MD;  Location: [facility];  Service: Neurosurgery;  Laterality: Left;  RM 21   LAPAROSCOPIC APPENDECTOMY  07/15/2012   Procedure: APPENDECTOMY LAPAROSCOPIC;  Surgeon: Zenovia Jarred, MD;  Location: [facility];  Service: General;  Laterality: N/A;  Laparoscopic Appendectomy   Patient Active Problem List   Diagnosis Date Noted   Brain tumor, glioma ([facility]) 04/09/2022   Essential hypertension 06/13/2015   Bilateral chronic knee pain 10/14/2013   Chronic bilateral low back pain without sciatica 11/01/2012   S/P laparoscopic appendectomy 07/28/2012   Migraines 09/24/2011   Hyperlipidemia 09/24/2011    ONSET DATE: 04/09/22   REFERRING DIAG: G93.89 (ICD-10-CM) - Brain mass   THERAPY DIAG:  Aphasia  Cognitive communication deficit  Rationale for Evaluation and Treatment Rehabilitation  SUBJECTIVE:   SUBJECTIVE STATEMENT: "I'm doing pretty good. At least that's what I'm telling myself." Pt accompanied by: self  PERTINENT  HISTORY: The pt is a 66 yo female presenting 04/09/22 with AMS and slurred speech. CT showed: L frontal mass and is now s/p resection 04/11/22. PMH includes: HTN, HLD, and migraines.  SLE on 04/15/22 revealed: Pt presents with expressive more than receptive language impairment wtih speech intelligible. She was grossly accurate with simple yes/no questions and one-step commands, but had more significant difficulty once asked to perform two-step commands with some perseverative errors noted. She was 90% accurate with confrontational naming but initially not able to name any words in divergent naming task. SLP allowed pt to choose a category more relevant to personal interests and then she was able to produce 5 words across one minute. With additional semantic cues, she doubled this to 10. Pt does seem to have some awareness of her word-finding errors and bennefits from cues for word retrieval. Cognition will need ongoing assessment as language is addressed, but do note some repetitive questions asked during this evaluation. Recommend OP SLP and heavy supervision/assistance upon discharge.   PAIN:  Are you having pain? Yes: NPRS scale: 2/10 Pain location: head Pain description: headache  PATIENT GOALS Pt would like to return to WNL speech rate and ability.  OBJECTIVE:   DIAGNOSTIC FINDINGS:  04/12/22 MRI HEAD WITH/WITHOUT CONTRAST: IMPRESSION: 1. Baseline postoperative study with expected postsurgical changes reflecting left frontal mass resection without definite residual enhancement. 2. Residual edema around the resection cavity as well as extra-axial fluid, air, and blood products resulting  in approximately 6 mm rightward midline shift, similar to the preoperative study. 3. Likely small amount of Peri cavity ischemia. No large vessel territorial infarct.   PATIENT REPORTED OUTCOME MEASURES (PROM): Communication Participation Item Bank and Communication Effectiveness Survey were completed  today.Communication Participation Item Bank: (CPIVB) scored 21/30 (higher scores indicate better QOL). and Communication Effectiveness Survey: (CES) scored  28/32 - lowest response with "speaking to a friend when you are emotionally upset or angry" 2/4, and 3/4 with "conversing with a stranger over the telephone" and "having a conversation with someone at a distance"   TODAY'S TREATMENT:  04/25/22: SLP assisted pt with verbalizaing/writing her thoughts about the [employer] dispute, which she said she learned of last night. Pt req'd mod A for attention to write ideas/points down for when she calls [employer] tomorrow. Pt req'd cue to use this strategy for memory. Pt's writing was WNL, however task completion was hindered by hyperverbosity which suggests internal distraction/decr'd attention skills. Pt homework to call [employer] tomorrow and express frustration and provide thoughts and feelings she has written today.  04/23/22: SLP provided pt CPIB and CES, scores are above. Pt named 11/14 [place] teams, and 4/4 [place] teams. When asked to describe her vacuum (husband was Aeronautical engineer) pt perseverated and generated vague speech. Homework to tell SLP current market price for her Electrolux vacuums.  04/20/22 (eval): SLP discussed the results of evaluation with pt and discussed homework SLP would like to see pt complete. SLP educated son about evaluation and homework, and encouraged family to supervise [name] getting her own meds and filling med box.   PATIENT EDUCATION: Education details: See above in "today's treatment" Person educated: Patient and Child(ren) Education method: Explanation and Handouts Education comprehension: verbalized understanding, returned demonstration, and needs further education   GOALS: Goals reviewed with patient? No  SHORT TERM GOALS: Target date: 05/17/2022    Pt will name 10 items in simple category (pertient to pt) in 60 seconds x3, in 3 sessions Baseline: Goal  status: Ongoing  2.  Pt will tell SLP 3 compensations for verbal expression. Baseline:  Goal status: Ongoing  3.  In a mod complex topic, pt will achieve at least 70 WPM in 3 sessions Baseline:  Goal status: Ongoing  4.  Pt will complete cognitive linguistic testing in first 2 sessions Baseline:  Goal status: Ongoing   LONG TERM GOALS: Target date: 07/12/2022    In 10 minutes mod complex conversation, pt will achieve at least 95 WPM in 3 sessions, using compensations Baseline:  Goal status: Ongoing  2.  Pt will tell SLP 3 compensations for verbal expression. Baseline:  Goal status: Ongoing  3.  In 10 minutes mod complex-complex conversation, pt will achieve at least 76 WPM in 3 sessions using compensations Baseline:  Goal status: Ongoing  4. Pt will not exhibit childish intonation in 10 minutes conversation in 3 sessions. Baseline:  Goal status: Ongoing    ASSESSMENT:  CLINICAL IMPRESSION: Patient is a 66 y.o. female who was seen today for treatment of receptive and expressive language following resection of a brain tumor on 04/11/22. She is a mother and sole caregiver for her 47 year old high-functioning autistic daughter. Pt cont to have difficulty with mod complex and mod complex/complex topics of conversation likely due to decr'd executive function. Pt cont'd with perseveration and exhibited pseudobulbar affect (childish tone of voice, overly tearful) which was distracting for listener. Courtlyn would benefit from standardized cognitive linguistic testing. Pt does not know if she  will need to undergo chemo or radiation at this time; She will find out Tuesday.  OBJECTIVE IMPAIRMENTS include executive functioning, expressive language, receptive language, and aphasia. These impairments are limiting patient from managing medications, managing appointments, managing finances, household responsibilities, and effectively communicating at home and in community. Factors affecting  potential to achieve goals and functional outcome are medical prognosis. Patient will benefit from skilled SLP services to address above impairments and improve overall function.  REHAB POTENTIAL: Fair depending on treatment plan if pt dx with brain cancer, or good  if pt does not need to undergo chemo or radiation.  PLAN: SLP FREQUENCY: 2x/week  SLP DURATION: 12 weeks  PLANNED INTERVENTIONS: Language facilitation, Environmental controls, Cueing hierachy, Cognitive reorganization, Internal/external aids, Functional tasks, Multimodal communication approach, SLP instruction and feedback, Compensatory strategies, and Patient/family education    [facility], [place] 04/28/2022, 11:24 AM

## 2022-04-25 NOTE — Therapy (Signed)
OUTPATIENT PHYSICAL THERAPY NEURO TREATMENT   [name] MRN: [id number] DOB:[birth year], 66 y.o., female Today's Date: 04/25/2022   PCP: Wendie Agreste, MD REFERRING PROVIDER: Ashok Pall, MD   PT End of Session - 04/25/22 1057     Visit Number 3    Number of Visits 9    Date for PT Re-Evaluation 05/16/22    Authorization Type BCBS Medicare    PT Start Time 1100    PT Stop Time 1145    PT Time Calculation (min) 45 min    Equipment Utilized During Treatment Gait belt    Activity Tolerance Patient tolerated treatment well    Behavior During Therapy WFL for tasks assessed/performed               Past Medical History:  Diagnosis Date   Hyperlipidemia    Hypertension    Migraine    Past Surgical History:  Procedure Laterality Date   APPENDECTOMY     APPLICATION OF CRANIAL NAVIGATION N/A 04/11/2022   Procedure: APPLICATION OF CRANIAL NAVIGATION;  Surgeon: Ashok Pall, MD;  Location: [facility];  Service: Neurosurgery;  Laterality: N/A;   CRANIOTOMY Left 04/11/2022   Procedure: LEFT FRONTAL CRANIOTOMY FOR RESECTION OF TUMOR WITH Lucky Rathke;  Surgeon: Ashok Pall, MD;  Location: [facility];  Service: Neurosurgery;  Laterality: Left;  RM 21   LAPAROSCOPIC APPENDECTOMY  07/15/2012   Procedure: APPENDECTOMY LAPAROSCOPIC;  Surgeon: Zenovia Jarred, MD;  Location: [facility];  Service: General;  Laterality: N/A;  Laparoscopic Appendectomy   Patient Active Problem List   Diagnosis Date Noted   Brain tumor, glioma ([facility]) 04/09/2022   Essential hypertension 06/13/2015   Bilateral chronic knee pain 10/14/2013   Chronic bilateral low back pain without sciatica 11/01/2012   S/P laparoscopic appendectomy 07/28/2012   Migraines 09/24/2011   Hyperlipidemia 09/24/2011    ONSET DATE: 04/09/22  REFERRING DIAG:  G93.89 (ICD-10-CM) - Brain mass  THERAPY DIAG:  Unsteadiness on feet  Muscle weakness (generalized)  Rationale for Evaluation and Treatment  Rehabilitation  SUBJECTIVE:                                                                                                                                                                                              SUBJECTIVE STATEMENT: Think things are pretty good.  Pain is about the same.  No falls.   Pt accompanied by: self  PERTINENT HISTORY: HTN, HLD, and migraines  PAIN:  Are you having pain? Yes: NPRS scale: 3/10 Pain location: L frontal head Pain description: headache Aggravating factors: loud sounds Relieving factors: dark room  PRECAUTIONS:  Fall  PATIENT GOALS "I want to get out of here as soon as possible."  OBJECTIVE:    TODAY'S TREATMENT: 04/25/2022 Activity Comments  STS 10x with green medball, then standing on Airex cushion, STS 10 reps, no UE support From 20" mat surfaces  Hip ABD with yellow TB 3x10 Good form, good ability to increase to 3 sets  hip EXT with yellowTB 3x10  Good form, good ability to increase to 3 sets   Resisted sidestepping, R and L in parallel bars, 3 reps Cues to look ahead and for light UE support  Forward/back monster walk in parallel bars, resisted with yellow theraband BUE support throughout, good form  Alt step taps to cones, 10 reps Cues for 1 UE support; holds RUE in mid-guard position  Forward/back step over obstacle, 10 reps each leg leading BUE support  Gait training using SPC, indoors 120 ft, outdoors on level surface 150 ft with supervision/CGA Cues for cane sequence and to try to relax LUE      Below measures were taken at time of initial evaluation unless otherwise specified:  DIAGNOSTIC FINDINGS: brain MRI: 04/09/22: 5.4 x 4.0 cm peripherally contrast-enhancing mass centered in the left frontal lobe with surrounding vasogenic edema, most consistent with a high-grade glioma. Rightward midline shift of 5 mm with impending herniation of the left cingulate gyrus.  COGNITION: Overall cognitive status:  good attention and  focus during conversation; occasionally inappropriate laughter    SENSATION: WFL  COORDINATION: Alt supination/pronation, alt toe tap, finger to nose intact B   MUSCLE TONE: intact B   POSTURE: No Significant postural limitations  LOWER EXTREMITY ROM:     Active  Right Eval Left Eval  Hip flexion    Hip extension    Hip abduction    Hip adduction    Hip internal rotation    Hip external rotation    Knee flexion    Knee extension    Ankle dorsiflexion 10 7  Ankle plantarflexion    Ankle inversion    Ankle eversion     (Blank rows = not tested)  LOWER EXTREMITY MMT:    MMT (in sitting) Right Eval Left Eval  Hip flexion 4 4+  Hip extension    Hip abduction 4 4  Hip adduction 4- 4-  Hip internal rotation    Hip external rotation    Knee flexion 4+ 4  Knee extension 4+ 5  Ankle dorsiflexion 4+ 4+  Ankle plantarflexion 4+ 4+  Ankle inversion    Ankle eversion    (Blank rows = not tested)  GAIT: Gait pattern:  wide BOS and with short slightly unsteady steps  Assistive device utilized: None Level of assistance: CGA   FUNCTIONAL TESTs:      PATIENT EDUCATION: Education details: prognosis, POC, HEP Person educated: Patient Education method: Consulting civil engineer, Demonstration, Tactile cues, Verbal cues, and Handouts Education comprehension: verbalized understanding   HOME EXERCISE PROGRAM: Access Code: [email or web address] URL: [email or web address] Date: 04/18/2022 Prepared by: [facility]  Exercises - Sit to Stand  - 1 x daily - 5 x weekly - 2 sets - 10 reps - Marching with Resistance  - 1 x daily - 5 x weekly - 2 sets - 10 reps - Standing Hip Abduction with Resistance at Ankles and Counter Support  - 1 x daily - 5 x weekly - 2 sets - 10 reps - Alternating Step Backward with Support  - 1 x daily - 5 x  weekly - 2 sets - 10 reps   GOALS: Goals reviewed with patient? Yes  SHORT TERM GOALS: Target date:  05/02/2022  Patient to be independent with initial HEP. Baseline: HEP initiated Goal status: IN PROGRESS    LONG TERM GOALS: Target date: 05/16/2022  Patient to be independent with advanced HEP. Baseline: Not yet initiated  Goal status: IN PROGRESS  Patient to demonstrate B LE strength >/=4+/5.  Baseline: See above Goal status: IN PROGRESS   Patient to score at least 20/24 on DGI in order to decrease risk of falls.  Baseline: 18/24 Goal status: IN PROGRESS  Patient to complete TUG in <14 sec with LRAD in order to decrease risk of falls.   Baseline: 17 sec Goal status: IN PROGRESS  Patient to demonstrate 5xSTS test in <15 sec in order to decrease risk of falls.  Baseline: 21 sec Goal status: IN PROGRESS  Patient to report tolerance for 1 hour of housework without fatigue limiting.  Baseline: unable Goal status: IN PROGRESS    ASSESSMENT:  CLINICAL IMPRESSION: Skilled PT session focused on functional lower extremity strengthening and balance.  With most balance exercises in parallel bars, pt's preference is for BUE support; with 1 UE support, she tends to hold other hand in mid-guard position, needing cues to relax.  Continued to work on Personnel officer with [place] on outdoor surfaces today; pt has increased difficulty with consistent sequence with distraction of environmental scanning, but no overt LOB.  She will continue to benefit from skilled PT towards goals for improved overall functional mobility, return to independence.  OBJECTIVE IMPAIRMENTS Abnormal gait, decreased balance, decreased cognition, decreased endurance, decreased knowledge of use of DME, decreased strength, and pain.   ACTIVITY LIMITATIONS carrying, lifting, bending, sitting, standing, squatting, sleeping, stairs, dressing, hygiene/grooming, and caring for others  PARTICIPATION LIMITATIONS: meal prep, cleaning, laundry, driving, shopping, community activity, and church  PERSONAL FACTORS Age, Behavior  pattern, Past/current experiences, Time since onset of injury/illness/exacerbation, and 3+ comorbidities: HTN, HLD, and migraines  are also affecting patient's functional outcome.   REHAB POTENTIAL: Good  CLINICAL DECISION MAKING: Evolving/moderate complexity  EVALUATION COMPLEXITY: Moderate  PLAN: PT FREQUENCY: 2x/week  PT DURATION: 4 weeks  PLANNED INTERVENTIONS: Therapeutic exercises, Therapeutic activity, Neuromuscular re-education, Balance training, Gait training, Patient/Family education, Self Care, Joint mobilization, Stair training, Vestibular training, Canalith repositioning, DME instructions, Aquatic Therapy, Dry Needling, Electrical stimulation, Cryotherapy, Moist heat, Taping, Manual therapy, and Re-evaluation  PLAN FOR NEXT SESSION:  Continue to progress dynamic balance activities; continue gait training with [facility] for improved safety and confidence with walking    Mady Haagensen, PT 04/25/22 11:52 AM Phone: [phone] Fax: [phone]   [facility] [address] [place], [place] [address] Phone # [phone] Fax # [phone]

## 2022-04-29 NOTE — Therapy (Signed)
OUTPATIENT PHYSICAL THERAPY NEURO TREATMENT   [name] MRN: [id number] DOB:Feb 03, 1956, 66 y.o., female Today's Date: 04/30/2022   PCP: Wendie Agreste, MD REFERRING PROVIDER: Ashok Pall, MD   PT End of Session - 04/30/22 1443     Visit Number 4    Number of Visits 9    Date for PT Re-Evaluation 05/16/22    Authorization Type BCBS Medicare    PT Start Time 1403    PT Stop Time 8756    PT Time Calculation (min) 39 min    Equipment Utilized During Treatment Gait belt    Activity Tolerance Patient tolerated treatment well    Behavior During Therapy WFL for tasks assessed/performed                Past Medical History:  Diagnosis Date   Hyperlipidemia    Hypertension    Migraine    Past Surgical History:  Procedure Laterality Date   APPENDECTOMY     APPLICATION OF CRANIAL NAVIGATION N/A 04/11/2022   Procedure: APPLICATION OF CRANIAL NAVIGATION;  Surgeon: Ashok Pall, MD;  Location: [facility];  Service: Neurosurgery;  Laterality: N/A;   CRANIOTOMY Left 04/11/2022   Procedure: LEFT FRONTAL CRANIOTOMY FOR RESECTION OF TUMOR WITH Lucky Rathke;  Surgeon: Ashok Pall, MD;  Location: [facility];  Service: Neurosurgery;  Laterality: Left;  RM 21   LAPAROSCOPIC APPENDECTOMY  07/15/2012   Procedure: APPENDECTOMY LAPAROSCOPIC;  Surgeon: Zenovia Jarred, MD;  Location: [facility];  Service: General;  Laterality: N/A;  Laparoscopic Appendectomy   Patient Active Problem List   Diagnosis Date Noted   Brain tumor, glioma ([place]) 04/09/2022   Essential hypertension 06/13/2015   Bilateral chronic knee pain 10/14/2013   Chronic bilateral low back pain without sciatica 11/01/2012   S/P laparoscopic appendectomy 07/28/2012   Migraines 09/24/2011   Hyperlipidemia 09/24/2011    ONSET DATE: 04/09/22  REFERRING DIAG:  G93.89 (ICD-10-CM) - Brain mass  THERAPY DIAG:  Unsteadiness on feet  Muscle weakness (generalized)  Rationale for Evaluation and Treatment  Rehabilitation  SUBJECTIVE:                                                                                                                                                                                              SUBJECTIVE STATEMENT: Today is a sad day because my sister died a year ago today.   Pt accompanied by: self  PERTINENT HISTORY: HTN, HLD, and migraines  PAIN:  Are you having pain? Yes: NPRS scale: 4/10 Pain location: L frontal head Pain description: headache Aggravating factors: loud sounds Relieving factors: dark room  PRECAUTIONS:  Fall  PATIENT GOALS "I want to get out of here as soon as possible."  OBJECTIVE:     TODAY'S TREATMENT: 04/30/22 Activity Comments  Nustep L 5 x 6 min Ues/Les  Cueing to maintain 70s SPM  straight leg bridge 2x10 Good core stability   bird dog 10x  More difficulty with L LE stabilizing; manual and verbal cues   Gait training with SPC outside on sidewalk and grass 417 feet Good stability; difficulty maintaining AD sequencing during conversation  fwd/back stepping with foam 10x each Ankle instability but no LOB  tandem walk fwd/back 1x  Cues to touch heel to toe each time      PATIENT EDUCATION: Education details: educated on working towards walking short durations with cane outside to work towards 30 min walk Person educated: Patient Education method: Consulting civil engineer, Media planner, Corporate treasurer cues, and Verbal cues Education comprehension: verbalized understanding and returned demonstration   Below measures were taken at time of initial evaluation unless otherwise specified:  DIAGNOSTIC FINDINGS: brain MRI: 04/09/22: 5.4 x 4.0 cm peripherally contrast-enhancing mass centered in the left frontal lobe with surrounding vasogenic edema, most consistent with a high-grade glioma. Rightward midline shift of 5 mm with impending herniation of the left cingulate gyrus.  COGNITION: Overall cognitive status:  good attention and focus during  conversation; occasionally inappropriate laughter    SENSATION: WFL  COORDINATION: Alt supination/pronation, alt toe tap, finger to nose intact B   MUSCLE TONE: intact B   POSTURE: No Significant postural limitations  LOWER EXTREMITY ROM:     Active  Right Eval Left Eval  Hip flexion    Hip extension    Hip abduction    Hip adduction    Hip internal rotation    Hip external rotation    Knee flexion    Knee extension    Ankle dorsiflexion 10 7  Ankle plantarflexion    Ankle inversion    Ankle eversion     (Blank rows = not tested)  LOWER EXTREMITY MMT:    MMT (in sitting) Right Eval Left Eval  Hip flexion 4 4+  Hip extension    Hip abduction 4 4  Hip adduction 4- 4-  Hip internal rotation    Hip external rotation    Knee flexion 4+ 4  Knee extension 4+ 5  Ankle dorsiflexion 4+ 4+  Ankle plantarflexion 4+ 4+  Ankle inversion    Ankle eversion    (Blank rows = not tested)  GAIT: Gait pattern:  wide BOS and with short slightly unsteady steps  Assistive device utilized: None Level of assistance: CGA   FUNCTIONAL TESTs:      PATIENT EDUCATION: Education details: prognosis, POC, HEP Person educated: Patient Education method: Consulting civil engineer, Demonstration, Tactile cues, Verbal cues, and Handouts Education comprehension: verbalized understanding   HOME EXERCISE PROGRAM: Access Code: [email or web address] URL: [email or web address] Date: 04/18/2022 Prepared by: [facility]  Exercises - Sit to Stand  - 1 x daily - 5 x weekly - 2 sets - 10 reps - Marching with Resistance  - 1 x daily - 5 x weekly - 2 sets - 10 reps - Standing Hip Abduction with Resistance at Ankles and Counter Support  - 1 x daily - 5 x weekly - 2 sets - 10 reps - Alternating Step Backward with Support  - 1 x daily - 5 x weekly - 2 sets - 10 reps   GOALS: Goals reviewed with patient? Yes  SHORT TERM GOALS:  Target date: 05/02/2022  Patient to  be independent with initial HEP. Baseline: HEP initiated Goal status: IN PROGRESS    LONG TERM GOALS: Target date: 05/16/2022  Patient to be independent with advanced HEP. Baseline: Not yet initiated  Goal status: IN PROGRESS  Patient to demonstrate B LE strength >/=4+/5.  Baseline: See above Goal status: IN PROGRESS   Patient to score at least 20/24 on DGI in order to decrease risk of falls.  Baseline: 18/24 Goal status: IN PROGRESS  Patient to complete TUG in <14 sec with LRAD in order to decrease risk of falls.   Baseline: 17 sec Goal status: IN PROGRESS  Patient to demonstrate 5xSTS test in <15 sec in order to decrease risk of falls.  Baseline: 21 sec Goal status: IN PROGRESS  Patient to report tolerance for 1 hour of housework without fatigue limiting.  Baseline: unable Goal status: IN PROGRESS    ASSESSMENT:  CLINICAL IMPRESSION: Patient arrived to session without new complaints. Worked on progressive hip and core strengthening activities today with intermittent cueing for form. Patient admits to limited fitness activities now and prior to her surgery, thus worked on gait training with [place] on outdoor surface. Patient demonstrated good stability throughout but noted difficulty maintaining proper sequencing during conversation. Tolerated balance activities with good stability today. Patient reported understanding of all edu provided and without complaints at end of session.    OBJECTIVE IMPAIRMENTS Abnormal gait, decreased balance, decreased cognition, decreased endurance, decreased knowledge of use of DME, decreased strength, and pain.   ACTIVITY LIMITATIONS carrying, lifting, bending, sitting, standing, squatting, sleeping, stairs, dressing, hygiene/grooming, and caring for others  PARTICIPATION LIMITATIONS: meal prep, cleaning, laundry, driving, shopping, community activity, and church  PERSONAL FACTORS Age, Behavior pattern, Past/current experiences, Time since  onset of injury/illness/exacerbation, and 3+ comorbidities: HTN, HLD, and migraines  are also affecting patient's functional outcome.   REHAB POTENTIAL: Good  CLINICAL DECISION MAKING: Evolving/moderate complexity  EVALUATION COMPLEXITY: Moderate  PLAN: PT FREQUENCY: 2x/week  PT DURATION: 4 weeks  PLANNED INTERVENTIONS: Therapeutic exercises, Therapeutic activity, Neuromuscular re-education, Balance training, Gait training, Patient/Family education, Self Care, Joint mobilization, Stair training, Vestibular training, Canalith repositioning, DME instructions, Aquatic Therapy, Dry Needling, Electrical stimulation, Cryotherapy, Moist heat, Taping, Manual therapy, and Re-evaluation  PLAN FOR NEXT SESSION:  Continue to progress dynamic balance activities; continue gait training with [place] for improved safety and confidence with walking    Janene Harvey, PT, DPT 04/30/22 2:45 PM  [facility] [address] [place], [place] [address] Phone # [phone] Fax # [phone]

## 2022-04-30 ENCOUNTER — Ambulatory Visit: Payer: Medicare Other

## 2022-04-30 ENCOUNTER — Ambulatory Visit: Payer: Medicare Other | Admitting: Physical Therapy

## 2022-04-30 ENCOUNTER — Encounter: Payer: Self-pay | Admitting: Occupational Therapy

## 2022-04-30 ENCOUNTER — Encounter: Payer: Self-pay | Admitting: Physical Therapy

## 2022-04-30 ENCOUNTER — Ambulatory Visit: Payer: Medicare Other | Admitting: Occupational Therapy

## 2022-04-30 DIAGNOSIS — R2681 Unsteadiness on feet: Secondary | ICD-10-CM

## 2022-04-30 DIAGNOSIS — R41840 Attention and concentration deficit: Secondary | ICD-10-CM

## 2022-04-30 DIAGNOSIS — M6281 Muscle weakness (generalized): Secondary | ICD-10-CM

## 2022-04-30 DIAGNOSIS — R41841 Cognitive communication deficit: Secondary | ICD-10-CM

## 2022-04-30 DIAGNOSIS — R4701 Aphasia: Secondary | ICD-10-CM

## 2022-04-30 DIAGNOSIS — I69818 Other symptoms and signs involving cognitive functions following other cerebrovascular disease: Secondary | ICD-10-CM

## 2022-04-30 NOTE — Therapy (Signed)
OUTPATIENT OCCUPATIONAL THERAPY NEURO TREATMENT NOTE  [name] MRN: [id number] DOB:08-27-1955, 66 y.o., female Today's Date: 04/30/2022  PCP: Wendie Agreste, MD REFERRING PROVIDER: Ashok Pall, MD    OT End of Session - 04/30/22 1448     Visit Number 3    Number of Visits 13    Date for OT Re-Evaluation 05/30/22    Authorization Type BCBS Medicare    OT Start Time 1446    OT Stop Time 1530    OT Time Calculation (min) 44 min    Activity Tolerance Patient tolerated treatment well    Behavior During Therapy WFL for tasks assessed/performed            Past Medical History:  Diagnosis Date   Hyperlipidemia    Hypertension    Migraine    Past Surgical History:  Procedure Laterality Date   APPENDECTOMY     APPLICATION OF CRANIAL NAVIGATION N/A 04/11/2022   Procedure: APPLICATION OF CRANIAL NAVIGATION;  Surgeon: Ashok Pall, MD;  Location: [place] [facility];  Service: Neurosurgery;  Laterality: N/A;   CRANIOTOMY Left 04/11/2022   Procedure: LEFT FRONTAL CRANIOTOMY FOR RESECTION OF TUMOR WITH Lucky Rathke;  Surgeon: Ashok Pall, MD;  Location: [place] 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) 04/09/2022   Essential hypertension 06/13/2015   Bilateral chronic knee pain 10/14/2013   Chronic bilateral low back pain without sciatica 11/01/2012   S/P laparoscopic appendectomy 07/28/2012   Migraines 09/24/2011   Hyperlipidemia 09/24/2011    ONSET DATE: 04/09/22  REFERRING DIAG: G93.89 (ICD-10-CM) - Brain mass   THERAPY DIAG:  Muscle weakness (generalized)  Attention and concentration deficit  Other symptoms and signs involving cognitive functions following other cerebrovascular  disease  Unsteadiness on feet  Rationale for Evaluation and Treatment Rehabilitation  SUBJECTIVE:   SUBJECTIVE STATEMENT: "I did my pillbox this morning and I did good" Pt accompanied by: self and son (remained in the lobby)  PERTINENT HISTORY: L frontal mass s/p resection on 04/11/22. PMH includes: HTN, HLD, and migraines  PRECAUTIONS: Fall  PAIN: Are you having pain? Yes: NPRS scale: 3-4/10 Pain location: head Pain description: "headache" Aggravating factors: unable to identify Relieving factors: medication  LIVING ENVIRONMENT: Lives with:  lives with adult daughter who has autism, adult son is staying with pt at this point to provide assistance as needed Lives in: Other town house Stairs: Yes: Internal: full flight downstairs to laundry, but she does not need to go down there at this time steps; on right going up and External: 3 steps; on right going up and can reach both Has following equipment at home: Environmental consultant - 2 wheeled  PLOF: Independent  PATIENT GOALS: to be completely independent   OBJECTIVE:  --------------------------------------------------------------------------------------------------------------------------------------------------------  HAND DOMINANCE: Right  ADLs: Overall ADLs: requires increased time to complete all ADLs, but is able to complete at Mod I level to supervision when showering Equipment: Walk in shower and built-in shower seat  IADLs: Light housekeeping: family is assisting at this time Meal Prep: family is currently completing all meal prep Community mobility: not cleared to drive Medication management: family is dispensing meds Financial management: family is completing at this time Handwriting: Increased time  COGNITION: Overall cognitive  status: Within functional limits for tasks assessed BIMS: Oriented to day, month, and year  STM: able to recall sock, blue, bed  Recall (after 5 mins): able to recall sock, blue,  bed  --------------------------------------------------------------------------------------------------------------------------------------------------------  TODAY'S TREATMENT:  IADLs Simulated voicemail/written note activity involving pt reading sample note and identifying specific information in order to answer provided questions (e.g., "Who is the note for?" and "What time is the event?"). Pt able to complete w/out difficulty; no limitations observed w/ handwriting as pt was able to complete w/ 100% legibility, good control, and correct word/letter formation  Simulated meal prep activity, using sample recipe to answer provided questions (e.g., "What should you preheat the oven to?" and "What do you need to buy?") to address executive functioning for participation in functional activity safely, alternating attention, handwriting, and sequencing. Pt was again able to identify factual, concrete information easily w/ incr difficulty noted w/ both deducing and inferring information when not clearly stated. OT provided breakdown of task, verbal prompts, and grading prn w/ pt ultimately able to complete activity w/ overall Min A.  Pegboard Activity Copying pattern onto pegboard w/ easy-grip pegs while simultaneously identifying appropriate words based on the category corresponding to the different color pegs to facilitate motor control, alternating attention, hand strengthening, and word finding. Pt able to complete activity w/ occ cues for spatial orientation when copying pattern and mod cues for word finding, particularly with more abstract categories (e.g., "place" and "person"). Pt also required cues to recognize when she had repeated an answer, and extended time throughout    PATIENT EDUCATION: Ongoing condition-specific education related to therapeutic interventions completed this session Person educated: Patient Education method: Explanation Education comprehension: verbalized understanding and  needs further education   HOME EXERCISE PROGRAM: TBD   GOALS: Goals reviewed with patient? Yes  SHORT TERM GOALS: Target date: 05/09/22  Pt will verbalize understanding of energy conservation techniques  Baseline: Goal status: IN PROGRESS  2.  Pt will verbalize understanding of task modifications and/or potential A/E needs to increase ease, safety, and independence w/ ADLs and IADLs. Baseline:  Goal status: IN PROGRESS  3.  Pt will demonstrate improved sequencing and problem solving as demonstrated by improvements in score on pill box assessment by reduction of errors to </= to 1. Baseline:  Goal status: IN PROGRESS   LONG TERM GOALS: Target date: 05/30/22  Pt will demonstrate ability to sequence simple functional task (simple snack prep, laundry task, etc) at Mod I level with good safety awareness Baseline:  Goal status: IN PROGRESS  2.  Pt will navigate a moderately busy environment, completing dual task activity and/or following multi-step commands with 90% accuracy Baseline:  Goal status: IN PROGRESS  3.  Pt will complete table top task with improvements in safety and insight, problem solving and attention to increase independence with IADLs and possible return to driving PRN. Baseline: Goal status: IN PROGRESS  4.  Pt will demonstrate improvements in cognition as demonstrated by improvements on SLUMS by from one category of impairment to lesser impairment. Baseline:  Goal status: IN PROGRESS  5.  Pt will report understanding of return to driving recommendations Baseline:  Goal status: IN PROGRESS   ASSESSMENT:  CLINICAL IMPRESSION: Pt appears to be progressing well toward goals w/ OT facilitating higher level functional tasks addressing dual tasking, executive functioning during IADLs, and handwriting. Able to complete first activity, identifying both stated information and inferred information without difficulty. During second, recipe-based activity, pt required  breakdown of task and  assist to facilitate understanding of more abstract thoughts (e.g., identifying what utensils she may need to use based on the provided recipe vs. identifying what to preheat the oven to, which was clearly stated). Difficulties during session indicate difficulty w/ attention and executive functioning, namely organizing and connecting information, self-monitoring, and functional recall/working memory. Pt will benefit from continuing w/ OT POC to further address deficits to ensure safety during ADLs and facilitate return to higher level IADLs (meal prep, med management, financial management, etc.) safely.  PERFORMANCE DEFICITS in functional skills including ADLs, IADLs, balance, endurance, and decreased knowledge of precautions, cognitive skills including attention, emotional, problem solving, and thought, and psychosocial skills including interpersonal interactions.   IMPAIRMENTS are limiting patient from ADLs and IADLs.   COMORBIDITIES may have co-morbidities  that affects occupational performance. Patient will benefit from skilled OT to address above impairments and improve overall function.   PLAN: OT FREQUENCY: 1-2x/week  OT DURATION: 6 weeks  PLANNED INTERVENTIONS: self care/ADL training, therapeutic activity, functional mobility training, patient/family education, cognitive remediation/compensation, coping strategies training, and DME and/or AE instructions  RECOMMENDED OTHER SERVICES: N/A  CONSULTED AND AGREED WITH PLAN OF CARE: Patient  PLAN FOR NEXT SESSION: working memory, alternating attention, engagement in IADLs   [place], OTR/L 04/30/2022, 3:34 PM

## 2022-04-30 NOTE — Therapy (Signed)
OUTPATIENT SPEECH LANGUAGE PATHOLOGY TREATMENT NOTE   [name] MRN: [id number] DOB:1956-05-05, 66 y.o., female Today's Date: 04/30/2022  PCP: Wendie Agreste, MD REFERRING PROVIDER: Ashok Pall, MD    End of Session - 04/30/22 1638     Visit Number 4    Number of Visits 25    Date for SLP Re-Evaluation 07/17/22    SLP Start Time 1320    SLP Stop Time  1400    SLP Time Calculation (min) 40 min    Activity Tolerance Patient tolerated treatment well                Past Medical History:  Diagnosis Date   Hyperlipidemia    Hypertension    Migraine    Past Surgical History:  Procedure Laterality Date   APPENDECTOMY     APPLICATION OF CRANIAL NAVIGATION N/A 04/11/2022   Procedure: APPLICATION OF CRANIAL NAVIGATION;  Surgeon: Ashok Pall, MD;  Location: [facility];  Service: Neurosurgery;  Laterality: N/A;   CRANIOTOMY Left 04/11/2022   Procedure: LEFT FRONTAL CRANIOTOMY FOR RESECTION OF TUMOR WITH Lucky Rathke;  Surgeon: Ashok Pall, MD;  Location: [facility];  Service: Neurosurgery;  Laterality: Left;  RM 21   LAPAROSCOPIC APPENDECTOMY  07/15/2012   Procedure: APPENDECTOMY LAPAROSCOPIC;  Surgeon: Zenovia Jarred, MD;  Location: [facility];  Service: General;  Laterality: N/A;  Laparoscopic Appendectomy   Patient Active Problem List   Diagnosis Date Noted   Brain tumor, 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[place]) 04/09/2022   Essential hypertension 06/13/2015   Bilateral chronic knee pain 10/14/2013   Chronic bilateral low back pain without sciatica 11/01/2012   S/P laparoscopic appendectomy 07/28/2012   Migraines 09/24/2011   Hyperlipidemia 09/24/2011    ONSET DATE: 04/09/22, resection 04/11/22  REFERRING DIAG: G93.89 (ICD-10-CM) - Brain mass   THERAPY DIAG:  Aphasia  Cognitive communication deficit  Rationale for Evaluation and Treatment Rehabilitation  SUBJECTIVE:   SUBJECTIVE STATEMENT: "Today's a sad day for me - I lost my sister a year ago today." Pt accompanied by:  self  PERTINENT HISTORY: The pt is a 66 yo female presenting 04/09/22 with AMS and slurred speech. CT showed: L frontal mass and is now s/p resection 04/11/22. PMH includes: HTN, HLD, and migraines.  SLE on 04/15/22 revealed: Pt presents with expressive more than receptive language impairment wtih speech intelligible. She was grossly accurate with simple yes/no questions and one-step commands, but had more significant difficulty once asked to perform two-step commands with some perseverative errors noted. She was 90% accurate with confrontational naming but initially not able to name any words in divergent naming task. SLP allowed pt to choose a category more relevant to personal interests and then she was able to produce 5 words across one minute. With additional semantic cues, she doubled this to 10. Pt does seem to have some awareness of her word-finding errors and bennefits from cues for word retrieval. Cognition will need ongoing assessment as language is addressed, but do note some repetitive questions asked during this evaluation. Recommend OP SLP and heavy supervision/assistance upon discharge.   PAIN:  Are you having pain? Yes: NPRS scale: 2/10 Pain location: head Pain description: headache  PATIENT GOALS Pt would like to return to WNL speech rate and ability.  OBJECTIVE:   DIAGNOSTIC FINDINGS:  04/12/22 MRI HEAD WITH/WITHOUT CONTRAST: IMPRESSION: 1. Baseline postoperative study with expected postsurgical changes reflecting left frontal mass resection without definite residual enhancement. 2. Residual edema around the resection cavity as well as extra-axial  fluid, air, and blood products resulting in approximately 6 mm rightward midline shift, similar to the preoperative study. 3. Likely small amount of Peri cavity ischemia. No large vessel territorial infarct.   PATIENT REPORTED OUTCOME MEASURES (PROM): Communication Participation Item Bank and Communication Effectiveness Survey were  completed today.Communication Participation Item Bank: (CPIVB) scored 21/30 (higher scores indicate better QOL). and Communication Effectiveness Survey: (CES) scored  28/32 - lowest response with "speaking to a friend when you are emotionally upset or angry" 2/4, and 3/4 with "conversing with a stranger over the telephone" and "having a conversation with someone at a distance"   TODAY'S TREATMENT:  04/30/22: SLP assisted pt through a divergent naming task - SLP asked for seven items in each section in the grocery store - pt with average 4.5 items prior to requiring cues.  Pt's overall language and processing appear to be improving. She held mod complex conversation with SLP, 15 minutes, that was WFL/WNL; >70WPM.   04/25/22: SLP assisted pt with verbalizaing/writing her thoughts about the [employer] dispute, which she said she learned of last night. Pt req'd mod A for attention to write ideas/points down for when she calls [employer] tomorrow. Pt req'd cue to use this strategy for memory. Pt's writing was WNL, however task completion was hindered by hyperverbosity which suggests internal distraction/decr'd attention skills. Pt homework to call [employer] tomorrow and express frustration and provide thoughts and feelings she has written today.  04/23/22: SLP provided pt CPIB and CES, scores are above. Pt named 11/14 [place] teams, and 4/4 [place] teams. When asked to describe her vacuum (husband was Aeronautical engineer) pt perseverated and generated vague speech. Homework to tell SLP current market price for her Elect[employer] vacuums.  04/20/22 (eval): SLP discussed the results of evaluation with pt and discussed homework SLP would like to see pt complete. SLP educated son about evaluation and homework, and encouraged family to supervise Lelaina getting her own meds and filling med box.   PATIENT EDUCATION: Education details: See above in "today's treatment" Person educated: Patient and Child(ren) Education method:  Explanation and Handouts Education comprehension: verbalized understanding, returned demonstration, and needs further education   GOALS: Goals reviewed with patient? No  SHORT TERM GOALS: Target date: 05/17/2022    Pt will name 10 items in simple category (pertient to pt) in 60 seconds x3, in 3 sessions Baseline: Goal status: Ongoing  2.  Pt will tell SLP 3 compensations for verbal expression. Baseline:  Goal status: Ongoing  3.  In a mod complex topic, pt will achieve at least 70 WPM in 3 sessions Baseline: 04/30/22 Goal status: Ongoing  4.  Pt will complete cognitive linguistic testing in first 2 sessions Baseline:  Goal status: Ongoing   LONG TERM GOALS: Target date: 07/12/2022    In 10 minutes mod complex conversation, pt will achieve at least 95 WPM in 3 sessions, using compensations Baseline:  Goal status: Ongoing  2.  Pt will tell SLP 3 compensations for verbal expression. Baseline:  Goal status: Ongoing  3.  In 10 minutes mod complex-complex conversation, pt will achieve at least 19 WPM in 3 sessions using compensations Baseline:  Goal status: Ongoing  4. Pt will not exhibit childish intonation in 10 minutes conversation in 3 sessions. Baseline:  Goal status: Ongoing    ASSESSMENT:  CLINICAL IMPRESSION: Patient is a 66 y.o. female who was seen today for treatment of receptive and expressive language following resection of a brain tumor on 04/11/22. 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 Pt  mod complex conversation has improved since last week. Pt pseudobulbar affect (childish tone of voice, overly tearful) was not as evident today as last week. Jaria would benefit from standardized cognitive linguistic testing. Pt does not know if she will need to undergo chemo or radiation at this time; She will find out Tuesday.  OBJECTIVE IMPAIRMENTS include executive functioning, expressive language, receptive language,  and aphasia. These impairments are limiting patient from managing medications, managing appointments, managing finances, household responsibilities, and effectively communicating at home and in community. Factors affecting potential to achieve goals and functional outcome are medical prognosis. Patient will benefit from skilled SLP services to address above impairments and improve overall function.  REHAB POTENTIAL: Fair depending on treatment plan if pt dx with brain cancer, or good  if pt does not need to undergo chemo or radiation.  PLAN: SLP FREQUENCY: 2x/week  SLP DURATION: 12 weeks  PLANNED INTERVENTIONS: Language facilitation, Environmental controls, Cueing hierachy, Cognitive reorganization, Internal/external aids, Functional tasks, Multimodal communication approach, SLP instruction and feedback, Compensatory strategies, and Patient/family education    [facility], CCC-SLP 04/30/2022, 4:39 PM

## 2022-05-01 ENCOUNTER — Other Ambulatory Visit: Payer: Self-pay | Admitting: Radiation Therapy

## 2022-05-01 ENCOUNTER — Telehealth: Payer: Self-pay | Admitting: Internal Medicine

## 2022-05-01 DIAGNOSIS — C719 Malignant neoplasm of brain, unspecified: Secondary | ICD-10-CM

## 2022-05-01 NOTE — Telephone Encounter (Signed)
Scheduled appt per 9/7 referral. Pt is aware of appt date and time. Pt is aware to arrive 15 mins prior to appt time and to bring and updated insurance card. Pt is aware of appt location.   

## 2022-05-01 NOTE — Therapy (Signed)
OUTPATIENT PHYSICAL THERAPY NEURO DISCHARGE SUMMARY   [name] MRN: [id number] DOB:Mar 10, 1956, 66 y.o., female Today's Date: 05/02/2022   PCP: Wendie Agreste, MD REFERRING PROVIDER: Ashok Pall, MD   Progress Note Reporting Period 04/18/22 to 05/02/22  See note below for Objective Data and Assessment of Progress/Goals.       PT End of Session - 05/02/22 1016     Visit Number 5    Number of Visits 9    Date for PT Re-Evaluation 05/16/22    Authorization Type [employer]BS Medicare    PT Start Time 0930    PT Stop Time 1011    PT Time Calculation (min) 41 min    Equipment Utilized During Treatment Gait belt    Activity Tolerance Other (comment);Patient tolerated treatment well   lightheadedness   Behavior During Therapy WFL for tasks assessed/performed                 Past Medical History:  Diagnosis Date   Hyperlipidemia    Hypertension    Migraine    Past Surgical History:  Procedure Laterality Date   APPENDECTOMY     APPLICATION OF CRANIAL NAVIGATION N/A 04/11/2022   Procedure: APPLICATION OF CRANIAL NAVIGATION;  Surgeon: Ashok Pall, MD;  Location: [facility];  Service: Neurosurgery;  Laterality: N/A;   CRANIOTOMY Left 04/11/2022   Procedure: LEFT FRONTAL CRANIOTOMY FOR RESECTION OF TUMOR WITH Lucky Rathke;  Surgeon: Ashok Pall, MD;  Location: [facility];  Service: Neurosurgery;  Laterality: Left;  RM 21   LAPAROSCOPIC APPENDECTOMY  07/15/2012   Procedure: APPENDECTOMY LAPAROSCOPIC;  Surgeon: Zenovia Jarred, MD;  Location: [place] [facility];  Service: General;  Laterality: N/A;  Laparoscopic Appendectomy   Patient Active Problem List   Diagnosis Date Noted   Brain tumor, 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[place]) 04/09/2022   Essential hypertension 06/13/2015   Bilateral chronic knee pain 10/14/2013   Chronic bilateral low back pain without sciatica 11/01/2012   S/P laparoscopic appendectomy 07/28/2012   Migraines 09/24/2011   Hyperlipidemia 09/24/2011    ONSET DATE:  04/09/22  REFERRING DIAG:  G93.89 (ICD-10-CM) - Brain mass  THERAPY DIAG:  Muscle weakness (generalized)  Unsteadiness on feet  Other symptoms and signs involving cognitive functions following other cerebrovascular disease  Rationale for Evaluation and Treatment Rehabilitation  SUBJECTIVE:                                                                                                                                                                                              SUBJECTIVE STATEMENT: MD told me that 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 Starting chemo  on Monday and Radiation on Tuesday.   Pt accompanied by: self  PERTINENT HISTORY: HTN, HLD, and migraines  PAIN:  Are you having pain? Yes: NPRS scale: 4/10 Pain location: L frontal head Pain description: headache Aggravating factors: loud sounds Relieving factors: dark room  PRECAUTIONS: Fall  PATIENT GOALS "I want to get out of here as soon as possible."  OBJECTIVE:     TODAY'S TREATMENT: 05/01/22 Activity Comments  Scifit L2.5 x 6 min Ues/Les  Good speed  Vitals  117/57 mmHg, 80 bpm, 96 % spO2  Vitals  97% spO2, 98/60 mmHg, 95bpm     LOWER EXTREMITY MMT:    MMT (in sitting) Right Eval Left Eval Right 05/02/22 Left 05/02/22  Hip flexion 4 4+ 4+ 5  Hip extension      Hip abduction 4 4 4+ 4+  Hip adduction 4- 4- 4 4  Hip internal rotation      Hip external rotation      Knee flexion 4+ 4 4+ 4+  Knee extension 4+ 5 4+ 5  Ankle dorsiflexion 4+ 4+ 4+ 4+  Ankle plantarflexion 4+ 4+ 4+ 4+  Ankle inversion      Ankle eversion      (Blank rows = not tested)   [facility] PT Assessment - 05/02/22 0001       Standardized Balance Assessment   Five times sit to stand comments  10.67 sec without UE support      Dynamic Gait Index   Level Surface Mild Impairment    Change in Gait Speed Mild Impairment    Gait with Horizontal Head Turns Normal    Gait with Vertical Head Turns Mild Impairment     Gait and Pivot Turn Normal    Step Over Obstacle Normal    Step Around Obstacles Mild Impairment      Timed Up and Go Test   Normal TUG (seconds) 11.31             PATIENT EDUCATION: Education details: edu on increasing hydration and monitoring BP at home; edu on exam findings and discharge  Person educated: Patient Education method: Explanation, Demonstration, Tactile cues, and Verbal cues Education comprehension: verbalized understanding and returned demonstration   Below measures were taken at time of initial evaluation unless otherwise specified:  DIAGNOSTIC FINDINGS: brain MRI: 04/09/22: 5.4 x 4.0 cm peripherally contrast-enhancing mass centered in the left frontal lobe with surrounding vasogenic edema, most consistent with a high-grade glioma. Rightward midline shift of 5 mm with impending herniation of the left cingulate gyrus.  COGNITION: Overall cognitive status:  good attention and focus during conversation; occasionally inappropriate laughter    SENSATION: WFL  COORDINATION: Alt supination/pronation, alt toe tap, finger to nose intact B   MUSCLE TONE: intact B   POSTURE: No Significant postural limitations  LOWER EXTREMITY ROM:     Active  Right Eval Left Eval  Hip flexion    Hip extension    Hip abduction    Hip adduction    Hip internal rotation    Hip external rotation    Knee flexion    Knee extension    Ankle dorsiflexion 10 7  Ankle plantarflexion    Ankle inversion    Ankle eversion     (Blank rows = not tested)  LOWER EXTREMITY MMT:    MMT (in sitting) Right Eval Left Eval  Hip flexion 4 4+  Hip extension    Hip abduction 4 4  Hip adduction 4- 4-  Hip  internal rotation    Hip external rotation    Knee flexion 4+ 4  Knee extension 4+ 5  Ankle dorsiflexion 4+ 4+  Ankle plantarflexion 4+ 4+  Ankle inversion    Ankle eversion    (Blank rows = not tested)  GAIT: Gait pattern:  wide BOS and with short slightly unsteady steps   Assistive device utilized: None Level of assistance: CGA   FUNCTIONAL TESTs:   [facility] PT Assessment - 05/02/22 0001       Standardized Balance Assessment   Five times sit to stand comments  10.67 sec without UE support      Dynamic Gait Index   Level Surface Mild Impairment    Change in Gait Speed Mild Impairment    Gait with Horizontal Head Turns Normal    Gait with Vertical Head Turns Mild Impairment    Gait and Pivot Turn Normal    Step Over Obstacle Normal    Step Around Obstacles Mild Impairment      Timed Up and Go Test   Normal TUG (seconds) 11.31               PATIENT EDUCATION: Education details: prognosis, POC, HEP Person educated: Patient Education method: Explanation, Demonstration, Tactile cues, Verbal cues, and Handouts Education comprehension: verbalized understanding   HOME EXERCISE PROGRAM: Access Code: [email or web address] URL: [email or web address] Date: 04/18/2022 Prepared by: [facility]  Exercises - Sit to Stand  - 1 x daily - 5 x weekly - 2 sets - 10 reps - Marching with Resistance  - 1 x daily - 5 x weekly - 2 sets - 10 reps - Standing Hip Abduction with Resistance at Ankles and Counter Support  - 1 x daily - 5 x weekly - 2 sets - 10 reps - Alternating Step Backward with Support  - 1 x daily - 5 x weekly - 2 sets - 10 reps   GOALS: Goals reviewed with patient? Yes  SHORT TERM GOALS: Target date: 05/02/2022  Patient to be independent with initial HEP. Baseline: HEP initiated Goal status: MET    LONG TERM GOALS: Target date: 05/16/2022  Patient to be independent with advanced HEP. Baseline: Not yet initiated  Goal status: MET  Patient to demonstrate B LE strength >/=4+/5.  Baseline: See above Goal status: MET   Patient to score at least 20/24 on DGI in order to decrease risk of falls.  Baseline: 18/24; unable to fully score but improved on 05/02/22 Goal status: NOT MET  Patient to  complete TUG in <14 sec with LRAD in order to decrease risk of falls.   Baseline: 17 sec Goal status: MET 05/02/22  Patient to demonstrate 5xSTS test in <15 sec in order to decrease risk of falls.  Baseline: 21 sec Goal status: MET 05/02/22  Patient to report tolerance for 1 hour of housework without fatigue limiting.  Baseline: reports tolerance for 30 min before onset of fatigue 05/02/22 Goal status: NOT MET  ASSESSMENT:  CLINICAL IMPRESSION: Patient arrived to session with report of getting a terminal cancer diagnosis from her MD yesterday, noting that she was told that she has 3-6 months remaining. Starting chemo on Monday and Radiation on Tuesday. Rechecked goals, which revealed great progress. Patient reports tolerance for 30 min of activity at home before onset of fatigue. Strength testing revealed improvement in B hip flexion, abduction, adduction, L knee flexion. TUG and 5xSTS tests show excellent improvement in speed; patient has now met  these goals. During balance testing, patient noted some lightheadedness- noted that she did not have much to eat this AM. Vitals showed low diastolic BP. Re-check after food and water break showed no improvement, thus session was cut short. Educated patient on increasing hydration and nutrition and to monitor BP at home. Patient reported understanding. Patient has met or partially met most goals at this time and is ready for D/C.    OBJECTIVE IMPAIRMENTS Abnormal gait, decreased balance, decreased cognition, decreased endurance, decreased knowledge of use of DME, decreased strength, and pain.   ACTIVITY LIMITATIONS carrying, lifting, bending, sitting, standing, squatting, sleeping, stairs, dressing, hygiene/grooming, and caring for others  PARTICIPATION LIMITATIONS: meal prep, cleaning, laundry, driving, shopping, community activity, and church  PERSONAL FACTORS Age, Behavior pattern, Past/current experiences, Time since onset of  injury/illness/exacerbation, and 3+ comorbidities: HTN, HLD, and migraines  are also affecting patient's functional outcome.   REHAB POTENTIAL: Good  CLINICAL DECISION MAKING: Evolving/moderate complexity  EVALUATION COMPLEXITY: Moderate  PLAN: PT FREQUENCY: 2x/week  PT DURATION: 4 weeks  PLANNED INTERVENTIONS: Therapeutic exercises, Therapeutic activity, Neuromuscular re-education, Balance training, Gait training, Patient/Family education, Self Care, Joint mobilization, Stair training, Vestibular training, Canalith repositioning, DME instructions, Aquatic Therapy, Dry Needling, Electrical stimulation, Cryotherapy, Moist heat, Taping, Manual therapy, and Re-evaluation  PLAN FOR NEXT SESSION:  DC at this time    PHYSICAL THERAPY DISCHARGE SUMMARY  Visits from Start of Care: 5  Current functional level related to goals / functional outcomes: See above clinical impression   Remaining deficits: Fatigue    Education / Equipment: HEP  Plan: Patient agrees to discharge.  Patient goals were partially met. Patient is being discharged due to meeting the stated rehab goals.         Janene Harvey, PT, DPT 05/02/22 10:17 AM  [facility] at [facility] [address] [place], [place], [place] [address] Phone # [phone] Fax # [phone]

## 2022-05-02 ENCOUNTER — Encounter: Payer: Self-pay | Admitting: Physical Therapy

## 2022-05-02 ENCOUNTER — Ambulatory Visit: Payer: Medicare Other | Admitting: Physical Therapy

## 2022-05-02 ENCOUNTER — Ambulatory Visit: Payer: Medicare Other

## 2022-05-02 DIAGNOSIS — R4701 Aphasia: Secondary | ICD-10-CM

## 2022-05-02 DIAGNOSIS — R2681 Unsteadiness on feet: Secondary | ICD-10-CM | POA: Diagnosis not present

## 2022-05-02 DIAGNOSIS — R41841 Cognitive communication deficit: Secondary | ICD-10-CM

## 2022-05-02 DIAGNOSIS — M6281 Muscle weakness (generalized): Secondary | ICD-10-CM

## 2022-05-02 DIAGNOSIS — I69818 Other symptoms and signs involving cognitive functions following other cerebrovascular disease: Secondary | ICD-10-CM

## 2022-05-02 NOTE — Therapy (Signed)
OUTPATIENT SPEECH LANGUAGE PATHOLOGY TREATMENT NOTE/DISCHARGE SUMMARY   [name] MRN: [id number] DOB:02-14-1956, 66 y.o., female Today's Date: 05/02/2022  PCP: Wendie Agreste, MD REFERRING PROVIDER: Ashok Pall, MD    End of Session - 05/02/22 1211     Visit Number 5    Number of Visits 25    Date for SLP Re-Evaluation 07/17/22    SLP Start Time 1104    SLP Stop Time  1147    SLP Time Calculation (min) 43 min    Activity Tolerance Patient tolerated treatment well                 Past Medical History:  Diagnosis Date   Hyperlipidemia    Hypertension    Migraine    Past Surgical History:  Procedure Laterality Date   APPENDECTOMY     APPLICATION OF CRANIAL NAVIGATION N/A 04/11/2022   Procedure: APPLICATION OF CRANIAL NAVIGATION;  Surgeon: Ashok Pall, MD;  Location: 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 Pt has shown improvement since evaluation with conversational topics of moderate complexity, to WFL/WNL. Compensations for memory and medication are in place - pt without  errors in these areas where she was getting assistance upon d/c from hospital.   Remaining deficits: Mild cognitive deficits.   Education / Equipment: Compensations.   Patient agrees to discharge. Patient goals were partially met. Patient is being discharged due to a change in medical status..     ONSET DATE: 04/09/22, resection 04/11/22  REFERRING DIAG: G93.89 (ICD-10-CM) - Brain mass   THERAPY DIAG:  Cognitive communication deficit  Aphasia  Rationale for Evaluation and Treatment Rehabilitation  SUBJECTIVE:   SUBJECTIVE STATEMENT:  "I didn't get good news yesterday. He said 3-6 months." Pt starts oral chemo next week and rad soon." Pt accompanied by: self  PERTINENT HISTORY: The pt is a 66 yo female presenting 04/09/22 with AMS and slurred speech. CT showed: L frontal mass and is now s/p resection 04/11/22. PMH includes: HTN, HLD, and migraines.  SLE on 04/15/22 revealed: Pt presents with expressive more than receptive language impairment wtih speech intelligible. She was grossly accurate with simple yes/no questions and one-step commands, but had more significant difficulty once asked to perform two-step commands with some perseverative errors noted. She was 90% accurate with confrontational naming but initially not able to name any words in divergent naming task. SLP allowed pt to choose a category more relevant to personal interests and then she was able to produce 5 words across one minute. With additional semantic cues, she doubled this to 10. Pt does seem to have some awareness of her word-finding errors  and bennefits from cues for word retrieval. Cognition will need ongoing assessment as language is addressed, but do note some repetitive questions asked during this evaluation. Recommend OP SLP and heavy supervision/assistance upon discharge.   PAIN:  Are you having pain? Yes: NPRS scale: 2/10 Pain location: head Pain description: headache  PATIENT GOALS Pt would like to  return to WNL speech rate and ability.  OBJECTIVE:   DIAGNOSTIC FINDINGS:  04/12/22 MRI HEAD WITH/WITHOUT CONTRAST: IMPRESSION: 1. Baseline postoperative study with expected postsurgical changes reflecting left frontal mass resection without definite residual enhancement. 2. Residual edema around the resection cavity as well as extra-axial fluid, air, and blood products resulting in approximately 6 mm rightward midline shift, similar to the preoperative study. 3. Likely small amount of Peri cavity ischemia. No large vessel territorial infarct.   PATIENT REPORTED OUTCOME MEASURES (PROM): Communication Participation Item Bank and Communication Effectiveness Survey were completed today.Communication Participation Item Bank: (CPIVB) scored 21/30 (higher scores indicate better QOL). and Communication Effectiveness Survey: (CES) scored  28/32 - lowest response with "speaking to a friend when you are emotionally upset or angry" 2/4, and 3/4 with "conversing with a stranger over the telephone" and "having a conversation with someone at a distance"   TODAY'S TREATMENT:  05/02/22: PT discharged today. Juliann Pulse demonstrated WFL/WNL description of MD visit yesterday. SLP asked pt the same question as on eval day. Pt was very circuitous on eval date and today gave much more detail and thought was more linear than on eval. Pt is using previous compensation of a calendar for appointments and is independent with this. She is using a 4 compartment/day med box which she is filling independently. Daughter has gone behind and has not found any errors. Pt paid bills last week and all came back paid without errors. SLP and pt discussed whether or not to cont at this time with ST, given pt's progress, need to get her details in order, and her upcoming rad tx. It was decided that pt would benefit more from less appointments at this time.  04/30/22: SLP assisted pt through a divergent naming task - SLP asked for seven items  in each section in the grocery store - pt with average 4.5 items prior to requiring cues.  Pt's overall language and processing appear to be improving. She held mod complex conversation with SLP, 15 minutes, that was WFL/WNL; >70WPM.   04/25/22: SLP assisted pt with verbalizaing/writing her thoughts about the [employer] dispute, which she said she learned of last night. Pt req'd mod A for attention to write ideas/points down for when she calls [employer] tomorrow. Pt req'd cue to use this strategy for memory. Pt's writing was WNL, however task completion was hindered by hyperverbosity which suggests internal distraction/decr'd attention skills. Pt homework to call [employer] tomorrow and express frustration and provide thoughts and feelings she has written today.  04/23/22: SLP provided pt CPIB and CES, scores are above. Pt named 11/14 [place] teams, and 4/4 [place] teams. When asked to describe her vacuum (husband was Aeronautical engineer) pt perseverated and generated vague speech. Homework to tell SLP current market price for her [employer] vacuums.  04/20/22 (eval): SLP discussed the results of evaluation with pt and discussed homework SLP would like to see pt complete. SLP educated son about evaluation and homework, and encouraged family to supervise Zafira getting her own meds and filling med box.   PATIENT EDUCATION: Education details: See above in "today's treatment" Person educated: Patient and Child(ren) Education method: Explanation  and Handouts Education comprehension: verbalized understanding, returned demonstration, and needs further education   GOALS: Goals reviewed with patient? No  SHORT TERM GOALS: Target date: 05/17/2022    Pt will name 10 items in simple category (pertient to pt) in 60 seconds x3, in 3 sessions Baseline: Goal status: Not met  2.  Pt will tell SLP 3 compensations for verbal expression. Baseline:  Goal status: Not met  3.  In a mod complex topic, pt will achieve  at least 70 WPM in 3 sessions Baseline: 04/30/22, 05-02-22 Goal status: partially met  4.  Pt will complete cognitive linguistic testing in first 2 sessions Baseline:  Goal status: Deferred   LONG TERM GOALS: Target date: 07/12/2022    In 10 minutes mod complex conversation, pt will achieve at least 95 WPM in 3 sessions, using compensations Baseline:  Goal status: Not met    2.  Pt will tell SLP 3 compensations for verbal expression. Baseline:  Goal status: Not met  3.  In 10 minutes mod complex-complex conversation, pt will achieve at least 85 WPM in 3 sessions using compensations Baseline:  Goal status: Not met  4. Pt will not exhibit childish intonation in 10 minutes conversation in 3 sessions. Baseline:  Goal status: Not met    ASSESSMENT:  CLINICAL IMPRESSION: Patient is a 66 y.o. female who was seen today for treatment of receptive and expressive language following resection of a brain tumor on 04/11/22. 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 Pt mod complex conversation today was functional/WNL Pt with no pseudobulbar affect (childish tone of voice, overly tearful) behavior. Pt and SLP decided given pt's medical news she will d/c ST today.   OBJECTIVE IMPAIRMENTS include executive functioning, expressive language, receptive language, and aphasia. These impairments are limiting patient from managing medications, managing appointments, managing finances, household responsibilities, and effectively communicating at home and in community. Factors affecting potential to achieve goals and functional outcome are medical prognosis. Patient will benefit from skilled SLP services to address above impairments and improve overall function.  REHAB POTENTIAL: Fair depending on treatment plan if pt dx with brain cancer, or good  if pt does not need to undergo chemo or radiation.  PLAN: Discharge today.  PLANNED INTERVENTIONS: Language  facilitation, Environmental controls, Cueing hierachy, Cognitive reorganization, Internal/external aids, Functional tasks, Multimodal communication approach, SLP instruction and feedback, Compensatory strategies, and Patient/family education    [facility], [place] 05/02/2022, 12:11 PM

## 2022-05-05 ENCOUNTER — Inpatient Hospital Stay (HOSPITAL_BASED_OUTPATIENT_CLINIC_OR_DEPARTMENT_OTHER): Payer: Medicare Other | Admitting: Internal Medicine

## 2022-05-05 ENCOUNTER — Encounter: Payer: Self-pay | Admitting: Internal Medicine

## 2022-05-05 ENCOUNTER — Inpatient Hospital Stay: Payer: Medicare Other | Attending: Neurosurgery

## 2022-05-05 ENCOUNTER — Other Ambulatory Visit: Payer: Self-pay

## 2022-05-05 VITALS — BP 134/74 | HR 105 | Temp 98.1°F | Resp 15 | Ht 62.0 in | Wt 147.9 lb

## 2022-05-05 DIAGNOSIS — C711 Malignant neoplasm of frontal lobe: Secondary | ICD-10-CM

## 2022-05-05 DIAGNOSIS — G4700 Insomnia, unspecified: Secondary | ICD-10-CM | POA: Insufficient documentation

## 2022-05-05 DIAGNOSIS — Z79899 Other long term (current) drug therapy: Secondary | ICD-10-CM | POA: Diagnosis not present

## 2022-05-05 MED ORDER — TEMOZOLOMIDE 20 MG PO CAPS
20.0000 mg | ORAL_CAPSULE | Freq: Every day | ORAL | 0 refills | Status: DC
  Filled 2022-05-05 – 2022-05-07 (×3): qty 42, 42d supply, fill #0

## 2022-05-05 MED ORDER — ONDANSETRON HCL 8 MG PO TABS
8.0000 mg | ORAL_TABLET | Freq: Three times a day (TID) | ORAL | 1 refills | Status: DC | PRN
  Filled 2022-05-05: qty 30, 10d supply, fill #0
  Filled 2022-06-03: qty 12, 4d supply, fill #1

## 2022-05-05 MED ORDER — TEMOZOLOMIDE 100 MG PO CAPS
100.0000 mg | ORAL_CAPSULE | Freq: Every day | ORAL | 0 refills | Status: DC
  Filled 2022-05-05 – 2022-05-07 (×2): qty 42, 42d supply, fill #0

## 2022-05-05 MED ORDER — TRAZODONE HCL 50 MG PO TABS: 50.0000 mg | ORAL_TABLET | Freq: Every evening | ORAL | 2 refills | Status: DC | PRN

## 2022-05-05 NOTE — Progress Notes (Signed)

## 2022-05-05 NOTE — Progress Notes (Signed)
Radiation Oncology         [phone] ________________________________  Name: Jennifer [name]        MRN: [id number]  Date of Service: 05/06/2022 DOB: Aug 16, 1956  GG:YIRS, Leda Gauze, PA-C  Ashok Pall, MD     REFERRING PHYSICIAN: Ashok Pall, MD   DIAGNOSIS: The encounter diagnosis was Glioblastoma multiforme of frontal lobe ([place]).   HISTORY OF PRESENT ILLNESS: Jennifer [name] is a 66 y.o. female seen at the request of Dr. Christella Noa for newly diagnosed glioblastoma IDH wild-type.  The patient presented with symptoms of confusion and expressive aphasia.  She was brought to the emergency room at [facility], she was transferred to [facility] and head CT showed a left frontal lobe abnormality.   MRI on 04/09/2022 showed a 5.4 x 4 cm peripherally enhancing mass in the left frontal lobe with surrounding vasogenic edema.  Rightward midline shift of 5 mm with impending herniation of the left cingulate gyrus was also noted.  She was into the operating room with Dr. Christella Noa for left frontal craniotomy on 04/11/2022.  Final pathology shows IDH wild-type glioblastoma.  Postop imaging on 04/12/2022 showed residual edema around the resection cavity as well as extra-axial fluid air and blood products resulting in a 6 mm rightward midline shift.  Small amount of pericavity ischemic change without large vessel infarct and within the cavity there did not appear to be any residual tumor visible.  She has been healing and is seen today to discuss adjuvant chemoradiation.    PREVIOUS RADIATION THERAPY: No   PAST MEDICAL HISTORY:  Past Medical History:  Diagnosis Date   Hyperlipidemia    Hypertension    Migraine        PAST SURGICAL HISTORY: Past Surgical History:  Procedure Laterality Date   APPENDECTOMY     APPLICATION OF CRANIAL NAVIGATION N/A 04/11/2022   Procedure: APPLICATION OF CRANIAL NAVIGATION;  Surgeon: Ashok Pall, MD;  Location: [place];  Service: Neurosurgery;  Laterality: N/A;    CRANIOTOMY Left 04/11/2022   Procedure: LEFT FRONTAL CRANIOTOMY FOR RESECTION OF TUMOR WITH Lucky Rathke;  Surgeon: Ashok Pall, MD;  Location: [place];  Service: Neurosurgery;  Laterality: Left;  RM 21   LAPAROSCOPIC APPENDECTOMY  07/15/2012   Procedure: APPENDECTOMY LAPAROSCOPIC;  Surgeon: Zenovia Jarred, MD;  Location: [place];  Service: General;  Laterality: N/A;  Laparoscopic Appendectomy     FAMILY HISTORY:  Family History  Adopted: Yes     SOCIAL HISTORY:  reports that she has never smoked. She has never used smokeless tobacco. She reports current alcohol use. She reports that she does not use drugs.  The patient is widowed and lives in [place].  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    ALLERGIES: Patient has no known allergies.   MEDICATIONS:  Current Outpatient Medications  Medication Sig Dispense Refill   atorvastatin (LIPITOR) 10 MG tablet TAKE 1 TABLET BY MOUTH EVERY DAY (Patient taking differently: Take 10 mg by mouth daily.) 90 tablet 1   busPIRone (BUSPAR) 5 MG tablet TAKE 1 TABLET BY MOUTH THREE TIMES A DAY (Patient taking differently: Take 5 mg by mouth 3 (three) times daily.) 270 tablet 1   fluticasone (FLONASE) 50 MCG/ACT nasal spray SPRAY 2 SPRAYS INTO EACH NOSTRIL EVERY DAY (Patient taking differently: Place 2 sprays into both nostrils daily.) 48 mL 0   ondansetron (ZOFRAN) 8 MG tablet Take 1 tablet (8 mg total) by mouth every 8 (  eight) hours as needed for nausea or vomiting. May take 30-60 minutes prior to Temodar administration if nausea/vomiting occurs as needed. 30 tablet 1   temozolomide (TEMODAR) 100 MG capsule Take 1 capsule (100 mg total) by mouth daily. May take on an empty stomach to decrease nausea & vomiting. 42 capsule 0   temozolomide (TEMODAR) 20 MG capsule Take 1 capsule (20 mg total) by mouth daily. May take on an empty stomach to decrease nausea & vomiting. 42 capsule 0   topiramate (TOPAMAX) 50 MG  tablet TAKE 1 TABLET BY MOUTH TWICE A DAY (Patient taking differently: Take 50 mg by mouth 2 (two) times daily.) 60 tablet 0   traMADol (ULTRAM) 50 MG tablet Take 50 mg by mouth every 8 (eight) hours as needed.     traZODone (DESYREL) 50 MG tablet Take 1 tablet (50 mg total) by mouth at bedtime as needed for sleep. 30 tablet 2   amLODipine (NORVASC) 5 MG tablet TAKE 1 TABLET BY MOUTH EVERY DAY (Patient not taking: Reported on 05/06/2022) 90 tablet 1   losartan (COZAAR) 50 MG tablet TAKE 1 TABLET BY MOUTH EVERY DAY (Patient not taking: Reported on 05/06/2022) 90 tablet 1   oxybutynin (DITROPAN-XL) 5 MG 24 hr tablet TAKE 1 TABLET BY MOUTH EVERYDAY AT BEDTIME (Patient not taking: Reported on 05/05/2022) 90 tablet 1   No current facility-administered medications for this encounter.     REVIEW OF SYSTEMS: On review of systems, the patient reports that she is doing pretty well and her daughter in law confirms she is back to baseline. She has not had any seizure activity since surgery. She has not had confusion or difficulty with speech. She is not having headaches, visual, or auditory changes. She's been self conscious about her hair and incision site. No other complaints are verbalized.   PHYSICAL EXAM:  Wt Readings from Last 3 Encounters:  05/06/22 149 lb (67.6 kg)  05/05/22 147 lb 14.4 oz (67.1 kg)  04/10/22 150 lb 9.2 oz (68.3 kg)   Temp Readings from Last 3 Encounters:  05/06/22 98.9 F (37.2 C) (Oral)  05/05/22 98.1 F (36.7 C)  04/15/22 97.8 F (36.6 C) (Oral)   BP Readings from Last 3 Encounters:  05/06/22 109/60  05/05/22 134/74  04/15/22 118/82   Pulse Readings from Last 3 Encounters:  05/06/22 93  05/05/22 (!) 105  04/15/22 88    In general this is a well appearing Caucasian female in no acute distress.  She's alert and oriented x4 and appropriate throughout the examination. Cardiopulmonary assessment is negative for acute distress and she exhibits normal effort.     ECOG  = 0  0 - Asymptomatic (Fully active, able to carry on all predisease activities without restriction)  1 - Symptomatic but completely ambulatory (Restricted in physically strenuous activity but ambulatory and able to carry out work of a light or sedentary nature. For example, light housework, office work)  2 - Symptomatic, <50% in bed during the day (Ambulatory and capable of all self care but unable to carry out any work activities. Up and about more than 50% of waking hours)  3 - Symptomatic, >50% in bed, but not bedbound (Capable of only limited self-care, confined to bed or chair 50% or more of waking hours)  4 - Bedbound (Completely disabled. Cannot carry on any self-care. Totally confined to bed or chair)  5 - Death   Eustace Pen MM, Creech RH, Tormey DC, et al. [phone]). "Toxicity and response criteria of  the [facility]". [place] Oncol. 5 (6): 649-55    LABORATORY DATA:  Lab Results  Component Value Date   WBC 16.0 (H) 04/12/2022   HGB 11.4 (L) 04/12/2022   HCT 34.2 (L) 04/12/2022   MCV 96.9 04/12/2022   PLT 243 04/12/2022   Lab Results  Component Value Date   NA 135 04/09/2022   K 4.1 04/09/2022   CL 105 04/09/2022   CO2 22 04/09/2022   Lab Results  Component Value Date   ALT 36 04/09/2022   AST 20 04/09/2022   ALKPHOS 61 04/09/2022   BILITOT 0.7 04/09/2022      RADIOGRAPHY: MR BRAIN W WO CONTRAST  Result Date: 04/12/2022 CLINICAL DATA:  CNS neoplasm with recent surgery EXAM: MRI HEAD WITHOUT AND WITH CONTRAST TECHNIQUE: Multiplanar, multiecho pulse sequences of the brain and surrounding structures were obtained without and with intravenous contrast. CONTRAST:  20m GADAVIST GADOBUTROL 1 MMOL/ML IV SOLN COMPARISON:  Brain MRI 04/10/2019 FINDINGS: Brain: There has been interval left frontal craniotomy for mass resection. There is expected fluid, pneumocephalus, and blood products about the resection cavity which measures approximately 3.5 cm TV x  2.6 cm AP in the axial plane. There is extensive T1 hyperintensity along the peripherally of the cavity consistent with blood products, limiting evaluation for residual enhancement; however, there is no definite residual enhancement FLAIR signal abnormality around the resection cavity predominantly posteriorly is similar to the prior study. There is small volume subdural blood along the right aspect of the falx and overlying the right frontal lobe as well as overlying the left anterior temporal lobe. There is residual mass effect resulting in partial effacement of the left lateral ventricle with up to approximately 6 mm rightward midline shift similar to the preoperative study. There is curvilinear diffusion restriction around the resection site which may reflect a small amount of peri-cavity ischemia. There is no evidence of large vessel territorial infarct. 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 There is no other abnormal enhancement. Vascular: Normal flow voids. Skull and upper cervical spine: Postoperative changes as above. No suspicious marrow signal abnormality. Sinuses/Orbits: The paranasal sinuses are clear. The globes and orbits are unremarkable. Other: Postsurgical fluid and swelling is seen in the left scalp extending into the left face. IMPRESSION: 1. Baseline postoperative study with expected postsurgical changes reflecting left frontal mass resection without definite residual enhancement. 2. Residual edema around the resection cavity as well as extra-axial fluid, air, and blood products resulting in approximately 6 mm rightward midline shift, similar to the preoperative study. 3. Likely small amount of Peri cavity ischemia. No large vessel territorial infarct. Electronically Signed   By: PValetta MoleM.D.   On: 04/12/2022 13:04   CT BRAINLAB HEAD WO/W CONTRAST (1MM)  Result Date: 04/10/2022 CLINICAL DATA:  Mass. EXAM: CT HEAD WITHOUT AND WITH CONTRAST TECHNIQUE: Contiguous axial  images were obtained from the base of the skull through the vertex without and with intravenous contrast. RADIATION DOSE REDUCTION: This exam was performed according to the departmental dose-optimization program which includes automated exposure control, adjustment of the mA and/or kV according to patient size and/or use of iterative reconstruction technique. CONTRAST:  1068mOMNIPAQUE IOHEXOL 300 MG/ML  SOLN COMPARISON:  MRI and CT from 04/09/2022. FINDINGS: Preoperative planning study. Brain: No substantial change in appearance of a peripherally enhancing left frontal lobe mass, better characterized on recent MRI. Similar mass effect. No evidence of acute large vascular territory infarct, interval hemorrhage,  or hydrocephalus. Vascular: No hyperdense vessel identified. Skull: No acute fracture. Sinuses/Orbits: Sinuses are clear.  No acute orbital findings. Other: No mastoid effusions. IMPRESSION: Preoperative planning study. No substantial change in appearance of a peripherally enhancing left frontal lobe mass, better characterized on recent MRI. Similar mass effect. Electronically Signed   By: Margaretha Sheffield M.D.   On: 04/10/2022 16:39   MR Brain W and Wo Contrast  Result Date: 04/09/2022 CLINICAL DATA:  Seizure EXAM: MRI HEAD WITHOUT AND WITH CONTRAST TECHNIQUE: Multiplanar, multiecho pulse sequences of the brain and surrounding structures were obtained without and with intravenous contrast. CONTRAST:  41m GADAVIST GADOBUTROL 1 MMOL/ML IV SOLN COMPARISON:  None Available. FINDINGS: Brain: There is a 5.4 x 4.0 cm peripherally contrast-enhancing mass centered in the left frontal lobe with surrounding vasogenic edema. There is approximately 5 mm of rightward midline shift with impending herniation of the left cingulate gyrus. Mass effect on the left lateral ventricle but no hydrocephalus. There are no other contrast-enhancing lesions. Small amount of blood is visible within the mass. No abnormal diffusion  restriction. Vascular: Major flow voids are preserved. Skull and upper cervical spine: Normal calvarium and skull base. Visualized upper cervical spine and soft tissues are normal. Sinuses/Orbits:No paranasal sinus fluid levels or advanced mucosal thickening. No mastoid or middle ear effusion. Normal orbits. IMPRESSION: 1. A 5.4 x 4.0 cm peripherally contrast-enhancing mass centered in the left frontal lobe with surrounding vasogenic edema, most consistent with a high-grade glioma. 2. Rightward midline shift of 5 mm with impending herniation of the left cingulate gyrus. Electronically Signed   By: KUlyses JarredM.D.   On: 04/09/2022 21:55   CT ANGIO HEAD NECK W WO CM W PERF (CODE STROKE)  Result Date: 04/09/2022 CLINICAL DATA:  Acute neuro deficit. EXAM: CT ANGIOGRAPHY HEAD AND NECK CT PERFUSION BRAIN TECHNIQUE: Multidetector CT imaging of the head and neck was performed using the standard protocol during bolus administration of intravenous contrast. Multiplanar CT image reconstructions and MIPs were obtained to evaluate the vascular anatomy. Carotid stenosis measurements (when applicable) are obtained utilizing NASCET criteria, using the distal internal carotid diameter as the denominator. Multiphase CT imaging of the brain was performed following IV bolus contrast injection. Subsequent parametric perfusion maps were calculated using RAPID software. RADIATION DOSE REDUCTION: This exam was performed according to the departmental dose-optimization program which includes automated exposure control, adjustment of the mA and/or kV according to patient size and/or use of iterative reconstruction technique. CONTRAST:  1081mOMNIPAQUE IOHEXOL 350 MG/ML SOLN COMPARISON:  CT head 04/09/2022 FINDINGS: CTA NECK FINDINGS Aortic arch: Mild atherosclerotic disease aortic arch. Bovine branching pattern. Proximal great vessels patent without stenosis. Right carotid system: Mild atherosclerotic disease right carotid bifurcation.  Negative for stenosis. Left carotid system: Mild atherosclerotic calcification left carotid bifurcation. No significant carotid stenosis. Vertebral arteries: Both vertebral arteries widely patent to the skull base without stenosis. Skeleton: Negative Other neck: 8 mm right thyroid nodule. 6.5 mm left thyroid nodule. No further imaging necessary. (Ref: J Am Coll Radiol. 2015 Feb;12(2): 143-50). Upper chest: Lung apices clear bilaterally Review of the MIP images confirms the above findings CTA HEAD FINDINGS Anterior circulation: Internal carotid artery widely patent through the skull base and cavernous segments. Anterior and middle cerebral arteries widely patent without stenosis or large vessel occlusion. Negative for cerebral aneurysm. Posterior circulation: Both vertebral arteries patent to the basilar. PICA patent bilaterally. Basilar widely patent. Superior cerebellar and posterior cerebral arteries patent. No large vessel occlusion or aneurysm Venous  sinuses: Normal venous enhancement Anatomic variants: None Review of the MIP images confirms the above findings CT Brain Perfusion Findings: ASPECTS: Not obtained due to mass lesion left frontal lobe CBF (<30%) Volume: 448m Perfusion (Tmax>6.0s) volume: 015mMismatch Volume: 48m46mnfarction Location:None IMPRESSION: 1. Mass lesion left frontal lobe shows peripheral enhancement. There is surrounding edema and mass-effect. This is most likely neoplasm. Favor glioblastoma. Recommend MRI head without and with contrast. 2. Mild atherosclerotic disease in the carotid bifurcation bilaterally. No significant carotid or vertebral artery stenosis 3. No intracranial stenosis or large vessel occlusion. Electronically Signed   By: ChaFranchot GalloD.   On: 04/09/2022 18:25   CT HEAD CODE STROKE WO CONTRAST  Result Date: 04/09/2022 CLINICAL DATA:  Code stroke.  Acute neuro deficit. EXAM: CT HEAD WITHOUT CONTRAST TECHNIQUE: Contiguous axial images were obtained from the base of  the skull through the vertex without intravenous contrast. RADIATION DOSE REDUCTION: This exam was performed according to the departmental dose-optimization program which includes automated exposure control, adjustment of the mA and/or kV according to patient size and/or use of iterative reconstruction technique. COMPARISON:  None Available. FINDINGS: Brain: Large mass lesion left frontal lobe. Central low density mass measures approximately 4.4 x 3.2 cm. Likely associated necrosis. There is surrounding hypodensity around the mass. No associated hemorrhage. There is local mass-effect on the left frontal horn and mild midline shift to the right of approximately 3 mm. This mass shows peripheral enhancement following contrast infusion on the CT angiogram performed today. No second mass lesion identified.  No acute hemorrhage. Vascular: Negative for hyperdense vessel Skull: Negative Sinuses/Orbits: Paranasal sinuses clear.  Negative orbit Other: None ASPECTS ([place] Program Early CT Score) Not applicable due to mass lesion. IMPRESSION: 1. Large low-density mass in the left frontal lobe with surrounding white matter hypodensity. Irregular peripheral enhancement following contrast infusion on CT angiogram. This is most compatible with malignancy. Favor glioblastoma. Metastatic disease also a consideration. Local mass-effect and mild midline shift. 2. MRI brain without with contrast recommended for further evaluation. 3. These results were called by telephone at the time of interpretation on 04/09/2022 at 6:11 pm to provider DAVID YAO , who verbally acknowledged these results. Electronically Signed   By: ChaFranchot GalloD.   On: 04/09/2022 18:12       IMPRESSION/PLAN: 1. IDH wild-type glioblastoma of the left frontal lobe. Dr. MooLisbeth Renshawscusses the pathology findings and reviews the nature of primary brain disease.  She has done well since surgery.  Dr. MooLisbeth Renshawscribes the rationale and course of chemoradiation  in this setting.  We discussed the risks, benefits, short, and long term effects of radiotherapy, as well as the curative intent, and the patient is interested in proceeding. Dr. MooLisbeth Renshawscusses the delivery and logistics of radiotherapy and anticipates a course of 6 weeks of radiotherapy. Written consent is obtained and placed in the chart, a copy was provided to the patient. She will simulate today with IV contrast.  We anticipate starting her treatment 05/12/22.  In a visit lasting 60 minutes, greater than 50% of the time was spent face to face discussing the patient's condition, in preparation for the discussion, and coordinating the patient's care.   The above documentation reflects my direct findings during this shared patient visit. Please see the separate note by Dr. MooLisbeth Renshaw this date for the remainder of the patient's plan of care.    AliCarola RhineACSurgical Center Of Tunkhannock County**Disclaimer: This note was dictated with voice recognition software.  Similar sounding words can inadvertently be transcribed and this note may contain transcription errors which may not have been corrected upon publication of note.**

## 2022-05-05 NOTE — Progress Notes (Addendum)
[facility] [place], [place] [address] [phone]   New Patient Evaluation  Date of Service: 05/05/22 [name] Patient MRN: [id number] Patient DOB: [birth year] Provider: Ventura Sellers, MD  Identifying Statement:  Jennifer [name] is a 66 y.o. female with left frontal glioblastoma who presents for initial consultation and evaluation.    Referring Provider: Ashok Pall, MD [address] [place],  [place] [address]  Oncologic History: Oncology History  Glioblastoma multiforme of frontal lobe ([facility])  04/11/2022 Surgery   Craniotomy, resection of left frontal mass with Dr. Christella Noa; path is glioblastoma IDHwt.     Biomarkers:  MGMT Unknown.  IDH 1/2 Wild type.  EGFR Unknown  TERT Unknown   History of Present Illness: The patient's records from the referring physician were obtained and reviewed and the patient interviewed to confirm this HPI.  Jennifer [name] presented to neurologic attention in mid-August 2023 with new onset difficulty speaking, confusion noticed by family.  She and her family described sudden onset speech impairment and confusion in the afternoon of the 16th, after having been seen in normal state of health that morning.  CNS imaging demonstrated an enhancing left frontal mass, which was resected by Dr. Christella Noa on 04/11/22.  Following surgery she had some speech difficulty, but improved quickly through rehab admission.  At present, she is fully independent with gait, speech, ADLs.  She is off decadron and Keppra.  Retired, had worked at [employer] in [employer].  Today presents with son and daughter in law.    Medications: Current Outpatient Medications on File Prior to Visit  Medication Sig Dispense Refill   amLODipine (NORVASC) 5 MG tablet TAKE 1 TABLET BY MOUTH EVERY DAY (Patient taking differently: Take 5 mg by mouth daily.) 90 tablet 1   atorvastatin (LIPITOR) 10 MG tablet  TAKE 1 TABLET BY MOUTH EVERY DAY (Patient taking differently: Take 10 mg by mouth daily.) 90 tablet 1   busPIRone (BUSPAR) 5 MG tablet TAKE 1 TABLET BY MOUTH THREE TIMES A DAY (Patient taking differently: Take 5 mg by mouth 3 (three) times daily.) 270 tablet 1   fluticasone (FLONASE) 50 MCG/ACT nasal spray SPRAY 2 SPRAYS INTO EACH NOSTRIL EVERY DAY (Patient taking differently: Place 2 sprays into both nostrils daily.) 48 mL 0   losartan (COZAAR) 50 MG tablet TAKE 1 TABLET BY MOUTH EVERY DAY (Patient taking differently: Take 50 mg by mouth daily.) 90 tablet 1   topiramate (TOPAMAX) 50 MG tablet TAKE 1 TABLET BY MOUTH TWICE A DAY (Patient taking differently: Take 50 mg by mouth 2 (two) times daily.) 60 tablet 0   traMADol (ULTRAM) 50 MG tablet Take 50 mg by mouth every 8 (eight) hours as needed.     oxybutynin (DITROPAN-XL) 5 MG 24 hr tablet TAKE 1 TABLET BY MOUTH EVERYDAY AT BEDTIME (Patient not taking: Reported on 05/05/2022) 90 tablet 1   traZODone (DESYREL) 50 MG tablet TAKE 0.5-1 TABLETS (25-50 MG TOTAL) BY MOUTH AT BEDTIME AS NEEDED FOR SLEEP. (Patient not taking: Reported on 05/05/2022) 30 tablet 0   No current facility-administered medications on file prior to visit.    Allergies: No Known Allergies Past Medical History:  Past Medical History:  Diagnosis Date   Hyperlipidemia    Hypertension    Migraine    Past Surgical History:  Past Surgical History:  Procedure Laterality Date   APPENDECTOMY     APPLICATION OF CRANIAL NAVIGATION  N/A 04/11/2022   Procedure: APPLICATION OF CRANIAL NAVIGATION;  Surgeon: Ashok Pall, MD;  Location: [facility];  Service: Neurosurgery;  Laterality: N/A;   CRANIOTOMY Left 04/11/2022   Procedure: LEFT FRONTAL CRANIOTOMY FOR RESECTION OF TUMOR WITH Lucky Rathke;  Surgeon: Ashok Pall, MD;  Location: [facility];  Service: Neurosurgery;  Laterality: Left;  RM 21   LAPAROSCOPIC APPENDECTOMY  07/15/2012   Procedure: APPENDECTOMY LAPAROSCOPIC;  Surgeon: Zenovia Jarred,  MD;  Location: [facility];  Service: General;  Laterality: N/A;  Laparoscopic Appendectomy   Social History:  Social History   Socioeconomic History   Marital status: Widowed    Spouse name: Not on file   Number of children: 2   Years of education: Not on file   Highest education level: Not on file  Occupational History   Occupation: retail    Comment: [employer] in [place], [place]  Tobacco Use   Smoking status: Never   Smokeless tobacco: Never  Substance and Sexual Activity   Alcohol use: Yes    Comment: 2 drinks once every 2-3 months   Drug use: No   Sexual activity: Not on file  Other Topics Concern   Not on file  Social History Narrative   Marital status: widowed since 2008; husband was bipolar and alcoholic      Children: 2 children (28, 62); son lives with patient.Daughter lives in [place]/autism/does not drive.  Daughter lives with mother-in-law.  One grandpuppy      Lives: with son      Employment: works at [employer]; also works in [employer].      Tobacco: none      Alcohol: none; husband was an alcoholic.      Drugs:   none      Exercise:   Social Determinants of Health   Financial Resource Strain: Not on file  Food Insecurity: Not on file  Transportation Needs: Not on file  Physical Activity: Not on file  Stress: Not on file  Social Connections: Not on file  Intimate Partner Violence: Not on file   Family History:  Family History  Adopted: Yes    Review of Systems: Constitutional: Doesn't report fevers, chills or abnormal weight loss Eyes: Doesn't report blurriness of vision Ears, nose, mouth, throat, and face: Doesn't report sore throat Respiratory: Doesn't report cough, dyspnea or wheezes Cardiovascular: Doesn't report palpitation, chest discomfort  Gastrointestinal:  Doesn't report nausea, constipation, diarrhea GU: Doesn't report incontinence Skin: Doesn't report skin rashes Neurological: Per HPI Musculoskeletal: Doesn't report  joint pain Behavioral/Psych: Doesn't report anxiety  Physical Exam: Vitals:   05/05/22 1527  BP: 134/74  Pulse: (!) 105  Resp: 15  Temp: 98.1 F (36.7 C)  SpO2: 98%   KPS: 80 ECOG: 1 General: Alert, cooperative, pleasant, in no acute distress Head: Normal EENT: No conjunctival injection or scleral icterus.  Lungs: Resp effort normal Cardiac: Regular rate Abdomen: Non-distended abdomen Skin: No rashes cyanosis or petechiae. Extremities: No clubbing or edema  Neurologic Exam: Mental Status: Awake, alert, attentive to examiner. Oriented to self and environment. Language is fluent with intact comprehension.  Pseudobulbar elements. Cranial Nerves: Visual acuity is grossly normal. Visual fields are full. Extra-ocular movements intact. No ptosis. Face is symmetric Motor: Tone and bulk are normal. Power is full in both arms and legs. Reflexes are symmetric, no pathologic reflexes present.  Sensory: Intact to light touch Gait: Normal.   Labs: I have reviewed the data as listed    Component Value Date/Time  NA 135 04/09/2022 1742   NA 140 10/04/2018 1703   K 4.1 04/09/2022 1742   CL 105 04/09/2022 1742   CO2 22 04/09/2022 1742   GLUCOSE 92 04/09/2022 1742   BUN 32 (H) 04/09/2022 1742   BUN 24 10/04/2018 1703   CREATININE 1.11 (H) 04/12/2022 0727   CREATININE 1.06 (H) 01/29/2016 1518   CALCIUM 9.4 04/09/2022 1742   PROT 6.9 04/09/2022 1742   PROT 6.8 10/04/2018 1703   ALBUMIN 4.5 04/09/2022 1742   ALBUMIN 4.6 10/04/2018 1703   AST 20 04/09/2022 1742   ALT 36 04/09/2022 1742   ALKPHOS 61 04/09/2022 1742   BILITOT 0.7 04/09/2022 1742   BILITOT 0.3 10/04/2018 1703   GFRNONAA 55 (L) 04/12/2022 0727   GFRNONAA 58 (L) 01/29/2016 1518   GFRAA 70 10/04/2018 1703   GFRAA 67 01/29/2016 1518   Lab Results  Component Value Date   WBC 16.0 (H) 04/12/2022   NEUTROABS 6.3 04/09/2022   HGB 11.4 (L) 04/12/2022   HCT 34.2 (L) 04/12/2022   MCV 96.9 04/12/2022   PLT 243  04/12/2022    Imaging:  MR BRAIN W WO CONTRAST  Result Date: 04/12/2022 CLINICAL DATA:  CNS neoplasm with recent surgery EXAM: MRI HEAD WITHOUT AND WITH CONTRAST TECHNIQUE: Multiplanar, multiecho pulse sequences of the brain and surrounding structures were obtained without and with intravenous contrast. CONTRAST:  105m GADAVIST GADOBUTROL 1 MMOL/ML IV SOLN COMPARISON:  Brain MRI 04/10/2019 FINDINGS: Brain: There has been interval left frontal craniotomy for mass resection. There is expected fluid, pneumocephalus, and blood products about the resection cavity which measures approximately 3.5 cm TV x 2.6 cm AP in the axial plane. There is extensive T1 hyperintensity along the peripherally of the cavity consistent with blood products, limiting evaluation for residual enhancement; however, there is no definite residual enhancement FLAIR signal abnormality around the resection cavity predominantly posteriorly is similar to the prior study. There is small volume subdural blood along the right aspect of the falx and overlying the right frontal lobe as well as overlying the left anterior temporal lobe. There is residual mass effect resulting in partial effacement of the left lateral ventricle with up to approximately 6 mm rightward midline shift similar to the preoperative study. There is curvilinear diffusion restriction around the resection site which may reflect a small amount of peri-cavity ischemia. There is no evidence of large vessel territorial infarct. 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 There is no other abnormal enhancement. Vascular: Normal flow voids. Skull and upper cervical spine: Postoperative changes as above. No suspicious marrow signal abnormality. Sinuses/Orbits: The paranasal sinuses are clear. The globes and orbits are unremarkable. Other: Postsurgical fluid and swelling is seen in the left scalp extending into the left face. IMPRESSION: 1. Baseline postoperative study with  expected postsurgical changes reflecting left frontal mass resection without definite residual enhancement. 2. Residual edema around the resection cavity as well as extra-axial fluid, air, and blood products resulting in approximately 6 mm rightward midline shift, similar to the preoperative study. 3. Likely small amount of Peri cavity ischemia. No large vessel territorial infarct. Electronically Signed   By: PValetta MoleM.D.   On: 04/12/2022 13:04   CT BRAINLAB HEAD WO/W CONTRAST (1MM)  Result Date: 04/10/2022 CLINICAL DATA:  Mass. EXAM: CT HEAD WITHOUT AND WITH CONTRAST TECHNIQUE: Contiguous axial images were obtained from the base of the skull through the vertex without and with intravenous contrast. RADIATION DOSE REDUCTION: This exam was performed  according to the departmental dose-optimization program which includes automated exposure control, adjustment of the mA and/or kV according to patient size and/or use of iterative reconstruction technique. CONTRAST:  118m OMNIPAQUE IOHEXOL 300 MG/ML  SOLN COMPARISON:  MRI and CT from 04/09/2022. FINDINGS: Preoperative planning study. Brain: No substantial change in appearance of a peripherally enhancing left frontal lobe mass, better characterized on recent MRI. Similar mass effect. No evidence of acute large vascular territory infarct, interval hemorrhage, or hydrocephalus. Vascular: No hyperdense vessel identified. Skull: No acute fracture. Sinuses/Orbits: Sinuses are clear.  No acute orbital findings. Other: No mastoid effusions. IMPRESSION: Preoperative planning study. No substantial change in appearance of a peripherally enhancing left frontal lobe mass, better characterized on recent MRI. Similar mass effect. Electronically Signed   By: FMargaretha SheffieldM.D.   On: 04/10/2022 16:39   MR Brain W and Wo Contrast  Result Date: 04/09/2022 CLINICAL DATA:  Seizure EXAM: MRI HEAD WITHOUT AND WITH CONTRAST TECHNIQUE: Multiplanar, multiecho pulse sequences of  the brain and surrounding structures were obtained without and with intravenous contrast. CONTRAST:  869mGADAVIST GADOBUTROL 1 MMOL/ML IV SOLN COMPARISON:  None Available. FINDINGS: Brain: There is a 5.4 x 4.0 cm peripherally contrast-enhancing mass centered in the left frontal lobe with surrounding vasogenic edema. There is approximately 5 mm of rightward midline shift with impending herniation of the left cingulate gyrus. Mass effect on the left lateral ventricle but no hydrocephalus. There are no other contrast-enhancing lesions. Small amount of blood is visible within the mass. No abnormal diffusion restriction. Vascular: Major flow voids are preserved. Skull and upper cervical spine: Normal calvarium and skull base. Visualized upper cervical spine and soft tissues are normal. Sinuses/Orbits:No paranasal sinus fluid levels or advanced mucosal thickening. No mastoid or middle ear effusion. Normal orbits. IMPRESSION: 1. A 5.4 x 4.0 cm peripherally contrast-enhancing mass centered in the left frontal lobe with surrounding vasogenic edema, most consistent with a high-grade glioma. 2. Rightward midline shift of 5 mm with impending herniation of the left cingulate gyrus. Electronically Signed   By: KeUlyses Jarred.D.   On: 04/09/2022 21:55   CT ANGIO HEAD NECK W WO CM W PERF (CODE STROKE)  Result Date: 04/09/2022 CLINICAL DATA:  Acute neuro deficit. EXAM: CT ANGIOGRAPHY HEAD AND NECK CT PERFUSION BRAIN TECHNIQUE: Multidetector CT imaging of the head and neck was performed using the standard protocol during bolus administration of intravenous contrast. Multiplanar CT image reconstructions and MIPs were obtained to evaluate the vascular anatomy. Carotid stenosis measurements (when applicable) are obtained utilizing NASCET criteria, using the distal internal carotid diameter as the denominator. Multiphase CT imaging of the brain was performed following IV bolus contrast injection. Subsequent parametric perfusion maps  were calculated using RAPID software. RADIATION DOSE REDUCTION: This exam was performed according to the departmental dose-optimization program which includes automated exposure control, adjustment of the mA and/or kV according to patient size and/or use of iterative reconstruction technique. CONTRAST:  10064mMNIPAQUE IOHEXOL 350 MG/ML SOLN COMPARISON:  CT head 04/09/2022 FINDINGS: CTA NECK FINDINGS Aortic arch: Mild atherosclerotic disease aortic arch. Bovine branching pattern. Proximal great vessels patent without stenosis. Right carotid system: Mild atherosclerotic disease right carotid bifurcation. Negative for stenosis. Left carotid system: Mild atherosclerotic calcification left carotid bifurcation. No significant carotid stenosis. Vertebral arteries: Both vertebral arteries widely patent to the skull base without stenosis. Skeleton: Negative Other neck: 8 mm right thyroid nodule. 6.5 mm left thyroid nodule. No further imaging necessary. (Ref: J Am Coll Radiol. 2015 Feb;12(2):  143-50). Upper chest: Lung apices clear bilaterally Review of the MIP images confirms the above findings CTA HEAD FINDINGS Anterior circulation: Internal carotid artery widely patent through the skull base and cavernous segments. Anterior and middle cerebral arteries widely patent without stenosis or large vessel occlusion. Negative for cerebral aneurysm. Posterior circulation: Both vertebral arteries patent to the basilar. PICA patent bilaterally. Basilar widely patent. Superior cerebellar and posterior cerebral arteries patent. No large vessel occlusion or aneurysm Venous sinuses: Normal venous enhancement Anatomic variants: None Review of the MIP images confirms the above findings CT Brain Perfusion Findings: ASPECTS: Not obtained due to mass lesion left frontal lobe CBF (<30%) Volume: 76m Perfusion (Tmax>6.0s) volume: 06mMismatch Volume: 67m867mnfarction Location:None IMPRESSION: 1. Mass lesion left frontal lobe shows peripheral  enhancement. There is surrounding edema and mass-effect. This is most likely neoplasm. Favor glioblastoma. Recommend MRI head without and with contrast. 2. Mild atherosclerotic disease in the carotid bifurcation bilaterally. No significant carotid or vertebral artery stenosis 3. No intracranial stenosis or large vessel occlusion. Electronically Signed   By: ChaFranchot GalloD.   On: 04/09/2022 18:25   CT HEAD CODE STROKE WO CONTRAST  Result Date: 04/09/2022 CLINICAL DATA:  Code stroke.  Acute neuro deficit. EXAM: CT HEAD WITHOUT CONTRAST TECHNIQUE: Contiguous axial images were obtained from the base of the skull through the vertex without intravenous contrast. RADIATION DOSE REDUCTION: This exam was performed according to the departmental dose-optimization program which includes automated exposure control, adjustment of the mA and/or kV according to patient size and/or use of iterative reconstruction technique. COMPARISON:  None Available. FINDINGS: Brain: Large mass lesion left frontal lobe. Central low density mass measures approximately 4.4 x 3.2 cm. Likely associated necrosis. There is surrounding hypodensity around the mass. No associated hemorrhage. There is local mass-effect on the left frontal horn and mild midline shift to the right of approximately 3 mm. This mass shows peripheral enhancement following contrast infusion on the CT angiogram performed today. No second mass lesion identified.  No acute hemorrhage. Vascular: Negative for hyperdense vessel Skull: Negative Sinuses/Orbits: Paranasal sinuses clear.  Negative orbit Other: None ASPECTS ([place] Program Early CT Score) Not applicable due to mass lesion. IMPRESSION: 1. Large low-density mass in the left frontal lobe with surrounding white matter hypodensity. Irregular peripheral enhancement following contrast infusion on CT angiogram. This is most compatible with malignancy. Favor glioblastoma. Metastatic disease also a consideration. Local  mass-effect and mild midline shift. 2. MRI brain without with contrast recommended for further evaluation. 3. These results were called by telephone at the time of interpretation on 04/09/2022 at 6:11 pm to provider DAVID YAO , who verbally acknowledged these results. Electronically Signed   By: ChaFranchot GalloD.   On: 04/09/2022 18:12    Pathology: SURGICAL PATHOLOGY SURGICAL PATHOLOGY  CASE: [id number]: Khushi Strohmeier  Surgical Pathology Report      Clinical History: brain tumor (cm)      FINAL MICROSCOPIC DIAGNOSIS:   A. BRAIN TUMOR, LEFT FRONTAL, RESECTION:  -Glioblastoma, IDH-wild-type, WHO grade 4 (outside consultation  [facility]  COMMENT:  [facility]  [facility].  The node section is quoted as follows   "Sections reveal a moderately cellular infiltrating glial neoplasm  composed of cells with enlarged and irregularly-shaped hyperchromatic  nuclei embedded within a fibrillar background.  The tumor has high  mitotic index and contains areas of necrosis and foci of microvascular  proliferation.  Immunohistochemical stains were performed and show the tumor cells are  immunoreactive for Olig2 while negative for IDH1 R132H mutant protein.  Only scattered cells labeled with p53.  The Ki-67 labeling index is  moderate.   Overall the findings are those of a glioblastoma, IDH wild-type, WHO  grade 4."   The [facility] reference number is [id number].   GROSS DESCRIPTION:  Specimen is received fresh and consists of a 5.2 x 4.1 x 2.5 cm  aggregate of tan-gray softened tissue.  Sectioning reveals focally  hemorrhagic surfaces.  Representative sections are submitted in 5  cassettes.  Craig Staggers 04/14/2022)     Assessment/Plan Glioblastoma multiforme of frontal lobe ([place])  We appreciate the opportunity to participate in the care of JAI BEAR.  She presents today with clinical and  radiographic syndrome c/w left frontal glioblastoma, IDH-wt.  We had an extensive conversation with her regarding pathology, prognosis, and available treatment pathways.  We are encouraged by her good quality resection and independent functional status.  Enhancement surrounding resection cavity is T1 bright and does no represent bulky residual tumor.    We ultimately recommended proceeding with course of intensity modulated radiation therapy and concurrent daily Temozolomide.  Radiation will be administered Mon-Fri over 6 weeks, Temodar will be dosed at 39m/m2 to be given daily over 42 days.  We reviewed side effects of temodar, including fatigue, nausea/vomiting, constipation, and cytopenias.  Informed consent was verbally obtained at bedside to proceed with oral chemotherapy.  Chemotherapy should be held for the following:  ANC less than 1,000  Platelets less than 100,000  LFT or creatinine greater than 2x ULN  If clinical concerns/contraindications develop  Every 2 weeks during radiation, labs will be checked accompanied by a clinical evaluation in the brain tumor clinic.  We recommended she continue Topamax 542mBID for migraine and seizure prevention, she has been on this for a number of years.  Ok with Trazodone 5097mS for insomnia, we reviewed sleep hygiene as well.  We also discussed and patient consented for additional tumor profiling and sequencing [facility] could help identify actionable mutation for targeted therapy and lead to direct clinical benefit.     Screening for potential clinical trials was performed and discussed using eligibility criteria for active protocols at [facility], as well as national database available on [email or web address]  The patient is a candidate and is interested in pursuing a research protocol.  Referral will be placed to research dept for more information regarding "Testing  Ramipril to Prevent Memory Loss in Patients with Glioblastoma"  via [employer] for clinical research at [facility] to supratentorial space [facility].  We spent twenty additional minutes teaching regarding the natural history, biology, and historical experience in the treatment of brain tumors. We then discussed in detail the current recommendations for therapy focusing on the mode of administration, mechanism of action, anticipated toxicities, and quality of life issues associated with this plan. We also provided teaching sheets for the patient to take home as an additional resource.  All questions were answered. The patient knows to call the clinic with any problems, questions or concerns. No barriers to learning were detected.  The total time spent in the encounter was 60 minutes and more than 50% was on counseling and review of test results   ZacVentura SellersD Medical Director of Neuro-Oncology [facility] [place]/11/23 3:23 PM

## 2022-05-05 NOTE — Progress Notes (Signed)
Location/Histology of Brain Tumor: Left Frontal Lobe Brain Tumor  Patient presented to the ED with aphasia and right sided weakness, code stroke protocol initiated.  Family noted some word finding difficulties for a few days prior to the aphasia and weakness.  Imaging obtained that was concerning for large frontal mass lesion.  MRI Brain 04/12/2022: Baseline postoperative study with expected postsurgical changes reflecting left frontal mass resection without definite residual enhancement.  Residual edema around the resection cavity as well as extra-axial fluid, air, and blood products resulting in approximately 6 mm rightward midline shift, similar to the preoperative study.  MRI Brain 04/09/2022: A 5.4 x 4.0 cm peripherally contrast-enhancing mass centered in the left frontal lobe with surrounding vasogenic edema, most consistent with a high-grade glioma.  Rightward midline shift of 5 mm with impending herniation of the left cingulate gyrus.  CT Head 04/09/2022: Large low-density mass in the left frontal lobe with surrounding white matter hypodensity. Irregular peripheral enhancement following contrast infusion on CT angiogram. This is most compatible with malignancy. Favor glioblastoma.  Past or anticipated interventions, if any, per neurosurgery:  Dr. Christella Noa -Left Frontal Craniotomy for resection of tumor with brainlab 04/11/2022   Pathology Report 04/11/2022   Past or anticipated interventions, if any, per medical oncology:  Dr. Mickeal Skinner 05/05/2022 -We ultimately recommended proceeding with course of intensity modulated radiation therapy and concurrent daily Temozolomide. -Radiation will be administered Mon-Fri over 6 weeks, Temodar will be dosed at '75mg'$ /m2 to be given daily over 42 days. -We recommended she continue Topamax '50mg'$  BID for migraine and seizure prevention, she has been on this for a number of years -The patient is a candidate and is interested in pursuing a research protocol.   Referral will be placed to research dept for more information regarding "Testing Ramipril to Prevent Memory Loss in Patients with Glioblastoma"  via Heide Scales, RN for clinical research at [facility]   Dose of Decadron, if applicable: N/A  Recent neurologic symptoms, if any:  Seizures: NO Headaches: YES Nausea: NO Dizziness/ataxia: NO Difficulty with hand coordination: NO Focal numbness/weakness: NO Visual deficits/changes: NO Confusion/Memory deficits: NO    SAFETY ISSUES: Prior radiation? No Pacemaker/ICD? NO Possible current pregnancy? Postmenopausal Is the patient on methotrexate? NO  Additional Complaints / other details: She has stopped taking her blood pressure medicine (Norvasc and Losartan) and wanted to make sure her physician team knows that.  BP was stable today without medication.

## 2022-05-06 ENCOUNTER — Telehealth: Payer: Self-pay

## 2022-05-06 ENCOUNTER — Ambulatory Visit
Admission: RE | Admit: 2022-05-06 | Discharge: 2022-05-06 | Disposition: A | Discharge status: Home / Self Care | Payer: Medicare Other | Source: Ambulatory Visit | Attending: Radiation Oncology | Admitting: Radiation Oncology

## 2022-05-06 ENCOUNTER — Other Ambulatory Visit: Payer: Self-pay

## 2022-05-06 ENCOUNTER — Telehealth: Payer: Self-pay | Admitting: Pharmacy Technician

## 2022-05-06 ENCOUNTER — Encounter: Payer: Self-pay | Admitting: Internal Medicine

## 2022-05-06 ENCOUNTER — Other Ambulatory Visit (HOSPITAL_COMMUNITY): Payer: Self-pay

## 2022-05-06 ENCOUNTER — Encounter: Payer: Self-pay | Admitting: Emergency Medicine

## 2022-05-06 VITALS — BP 109/60 | HR 93 | Temp 98.9°F | Ht 62.0 in | Wt 149.0 lb

## 2022-05-06 DIAGNOSIS — C711 Malignant neoplasm of frontal lobe: Secondary | ICD-10-CM

## 2022-05-06 DIAGNOSIS — Z79630 Long term (current) use of alkylating agent: Secondary | ICD-10-CM | POA: Diagnosis not present

## 2022-05-06 DIAGNOSIS — E042 Nontoxic multinodular goiter: Secondary | ICD-10-CM | POA: Diagnosis not present

## 2022-05-06 DIAGNOSIS — I10 Essential (primary) hypertension: Secondary | ICD-10-CM | POA: Insufficient documentation

## 2022-05-06 DIAGNOSIS — E785 Hyperlipidemia, unspecified: Secondary | ICD-10-CM | POA: Insufficient documentation

## 2022-05-06 DIAGNOSIS — R609 Edema, unspecified: Secondary | ICD-10-CM | POA: Insufficient documentation

## 2022-05-06 DIAGNOSIS — Z510 Encounter for antineoplastic radiation therapy: Secondary | ICD-10-CM | POA: Insufficient documentation

## 2022-05-06 DIAGNOSIS — Z79899 Other long term (current) drug therapy: Secondary | ICD-10-CM | POA: Insufficient documentation

## 2022-05-06 MED ORDER — SODIUM CHLORIDE 0.9% FLUSH
10.0000 mL | Freq: Once | INTRAVENOUS | Status: AC
  Administered 2022-05-06: 10 mL via INTRAVENOUS

## 2022-05-06 NOTE — Research (Signed)
[id number] A Single Arm, Pilot Study of Ramipril for Preventing Radiation-Induced Cognitive Decline in Glioblastoma (GBM) Patients Receiving Brain Radiotherapy  INTRO STUDY/CONSENTS  Patient KHAMORA KARAN was identified by this Clinical Research Nurse as a potential candidate for the above listed study.  This Clinical Research Nurse met with PALIN TRISTAN, [id number], on 05/06/22 in a manner and location that ensures patient privacy to discuss participation in the above listed research study.  Patient is Accompanied by daughter in [place] .  A copy of the informed consent document and separate HIPAA Authorization was provided to the patient.  Patient reads, speaks, and understands [place].   Patient was provided with the business card of this Nurse and encouraged to contact the research team with any questions.  Approximately 30 minutes were spent with the patient reviewing the informed consent documents.  Patient was provided the option of taking informed consent documents home to review and was encouraged to review at their convenience with their support network, including other care providers. Patient took the consent documents home to review.  Will f/u with patient in the next day or so to determine interest.  Patient states she has not taken her losartan or amlodipine since 05/01/22.  [name], RN, BSN Clinical Research Nurse I 05/06/22 4:19 PM

## 2022-05-06 NOTE — Telephone Encounter (Signed)
Oral Oncology Patient Advocate Encounter  After completing a benefits investigation, prior authorization for Temozolomide is not required at this time through [facility].  Temozolomide '20mg'$  copay is $72.47 Temozolomide '100mg'$  copay is $362.34   Lady Deutscher, CPhT-Adv Oncology Pharmacy Patient [place] Direct Number: [phone]  Fax: [phone]

## 2022-05-06 NOTE — Progress Notes (Signed)
Has armband been applied?  Yes.    Does patient have an allergy to IV contrast dye?: No.   Has patient ever received premedication for IV contrast dye?: No.   Does patient take metformin?: No.  If patient does take metformin when was the last dose: N/A  Date of lab work: 04/09/2022 BUN: 32 CR: 1.05 eGfr: 59   IV site: Right AC  Has IV site been added to flowsheet?  Yes.

## 2022-05-06 NOTE — Telephone Encounter (Signed)
Oral Oncology Pharmacist Encounter  Received new prescription for temozolomide (Temodar) for the treatment of glioblastoma in conjunction with radiation, planned duration of 42 days.  Labs from 04/12/2022 assessed, no interventions needed. Prescription dose and frequency assessed. Zofran prescription sent in to [facility].  Current medication list in Epic reviewed, DDIs with Temodar identified: none  Evaluated chart and no patient barriers to medication adherence noted.   Patient agreement for treatment documented in MD note on 05/05/2022.  Prescription has been e-scribed to the [facility] for benefits analysis and approval.  Oral Oncology Clinic will continue to follow for insurance authorization, copayment issues, initial counseling and start date.  Drema Halon, PharmD Hematology/Oncology Clinical Pharmacist [facility] [phone] 05/06/2022 9:02 AM

## 2022-05-07 ENCOUNTER — Ambulatory Visit: Payer: Medicare Other | Admitting: Occupational Therapy

## 2022-05-07 ENCOUNTER — Telehealth: Payer: Self-pay | Admitting: Emergency Medicine

## 2022-05-07 ENCOUNTER — Ambulatory Visit: Payer: Medicare Other | Admitting: Physical Therapy

## 2022-05-07 ENCOUNTER — Other Ambulatory Visit (HOSPITAL_COMMUNITY): Payer: Self-pay

## 2022-05-07 ENCOUNTER — Ambulatory Visit: Payer: Medicare Other

## 2022-05-07 ENCOUNTER — Other Ambulatory Visit: Payer: Self-pay

## 2022-05-07 DIAGNOSIS — R2681 Unsteadiness on feet: Secondary | ICD-10-CM | POA: Diagnosis not present

## 2022-05-07 DIAGNOSIS — I69818 Other symptoms and signs involving cognitive functions following other cerebrovascular disease: Secondary | ICD-10-CM

## 2022-05-07 DIAGNOSIS — R41840 Attention and concentration deficit: Secondary | ICD-10-CM

## 2022-05-07 NOTE — Telephone Encounter (Signed)
Oral Chemotherapy Pharmacist Encounter  I spoke with patient for overview of: Temodar for the treatment of glioblastoma multiforme in conjunction with radiation, planned duration concomitant phase 42 days of therapy.  Patient will likely continue on Temodar for maintenance treatment for 6-12 cycles after completion of concomitant phase.  Counseled patient on administration, dosing, side effects, monitoring, drug-food interactions, safe handling, storage, and disposal.  Patient will take Temodar '100mg'$  capsules and Temodar '20mg'$  capsules, 120 mg total daily dose, by mouth once daily, may take at bedtime and on an empty stomach to decrease nausea and vomiting.  Patient will take Temodar concurrent with radiation for 42 days straight.  Temodar and radiation start date: temodar to start on Sunday night 9/17 and radiation on Monday 9/18.   Adverse effects include but are not limited to: nausea, vomiting, anorexia, GI upset, rash, drug fever, and fatigue.  Rare but serious adverse effects of pneumocystis pneumonia and secondary malignancy also discussed. PCP prophylaxis will not be initiated at this time, but may be added based on lymphocyte count in the future.  Prophylactic Zofran will not be used at initiation of concurrent phase, but will be initiated if nausea develops despite Temodar administration on an empty stomach and at bedtime. If this occurs, patient will take Zofran 8 mg tablet, 1 tablet by mouth 30-60 min prior to Temodar dose to help decrease N/V   Reviewed with patient importance of keeping a medication schedule and plan for any missed doses. No barriers to medication adherence identified.  Medication reconciliation performed and medication/allergy list updated.  Insurance authorization for Temodar has been obtained. Test claim at the pharmacy revealed copayment $434.81 for 1st fill of 42 days. This will ship from the [facility] on 05/08/22 to deliver to  patient's home on 05/09/22.  Patient informed the pharmacy will reach out 5-7 days prior to needing next fill of Temodar to coordinate continued medication acquisition to prevent break in therapy.   All questions answered.  Mrs. Beining voiced understanding and appreciation.   Medication education handout placed in mail for patient. Patient knows to call the office with questions or concerns. Oral Chemotherapy Clinic phone number provided to patient.   Drema Halon, PharmD Hematology/Oncology Clinical Pharmacist [facility] [phone] 05/07/2022  3:49 PM

## 2022-05-07 NOTE — Telephone Encounter (Signed)
[id number] A Single Arm, Pilot Study of Ramipril for Preventing Radiation-Induced Cognitive Decline in Glioblastoma (GBM) Patients Receiving Brain Radiotherapy  Called to f/u on interest in study.  Patient would like one more day to decide.  Will f/u tomorrow.  [name], RN, BSN Clinical Research Nurse I 05/07/22 11:09 AM

## 2022-05-07 NOTE — Therapy (Signed)
OUTPATIENT OCCUPATIONAL THERAPY TREATMENT NOTE & Discharge  [name] MRN: [id number] DOB:03-28-1956, 66 y.o., female Today's Date: 05/07/2022  PCP: Wendie Agreste, MD REFERRING PROVIDER: Ashok Pall, MD   OCCUPATIONAL THERAPY DISCHARGE SUMMARY  Visits from Start of Care: 4  Current functional level related to goals / functional outcomes: Pt is demonstrating improvements in dual tasking, executive functioning during IADLs with meal prep at home with waning supervision, and taking on increased engagement in medication management and bill paying.  Pt continues to demonstrate difficulties with attention and executive functioning, namely organizing and connecting information, self-monitoring, and functional recall/working memory. Recommend supervision with meal prep and higher level IADLs to ensure safety and reduction of errors.    Remaining deficits: Mild cognitive impairments, decreased attention and executive functioning   Education / Equipment: Educated on energy conservation strategies, provided with recommendations for involvement in motivating ADLs/IADLs   Patient agrees to discharge. Patient goals were partially met. Patient is being discharged due to a change in medical status..     OT End of Session - 05/07/22 1420     Visit Number 4    Number of Visits 13    Date for OT Re-Evaluation 05/30/22    Authorization Type BCBS Medicare    OT Start Time 1408    OT Stop Time 1448    OT Time Calculation (min) 40 min    Activity Tolerance Patient tolerated treatment well    Behavior During Therapy WFL for tasks assessed/performed             Past Medical History:  Diagnosis Date   Hyperlipidemia    Hypertension    Migraine    Past Surgical History:  Procedure Laterality Date   APPENDECTOMY     APPLICATION OF CRANIAL NAVIGATION N/A 04/11/2022   Procedure: APPLICATION OF CRANIAL NAVIGATION;  Surgeon: Ashok Pall, MD;  Location: [place];   Service: Neurosurgery;  Laterality: N/A;   CRANIOTOMY Left 04/11/2022   Procedure: LEFT FRONTAL CRANIOTOMY FOR RESECTION OF TUMOR WITH Lucky Rathke;  Surgeon: Ashok Pall, MD;  Location: [place];  Service: Neurosurgery;  Laterality: Left;  RM 21   LAPAROSCOPIC APPENDECTOMY  07/15/2012   Procedure: APPENDECTOMY LAPAROSCOPIC;  Surgeon: Zenovia Jarred, MD;  Location: [place];  Service: General;  Laterality: N/A;  Laparoscopic Appendectomy   Patient Active Problem List   Diagnosis Date Noted   Glioblastoma multiforme of frontal lobe ([place]) 04/09/2022   Essential hypertension 06/13/2015   Bilateral chronic knee pain 10/14/2013   Chronic bilateral low back pain without sciatica 11/01/2012   S/P laparoscopic appendectomy 07/28/2012   Migraines 09/24/2011   Hyperlipidemia 09/24/2011    ONSET DATE: 04/09/22  REFERRING DIAG: G93.89 (ICD-10-CM) - Brain mass   THERAPY DIAG:  Other symptoms and signs involving cognitive functions following other cerebrovascular disease  Attention and concentration deficit  Rationale for Evaluation and Treatment Rehabilitation  SUBJECTIVE:   SUBJECTIVE STATEMENT: Pt reports starting chemo and radiation on Monday for 6 weeks. Pt accompanied by: self and friend  PERTINENT HISTORY: L frontal mass s/p resection on 04/11/22. PMH includes: HTN, HLD, and migraines  PRECAUTIONS: Fall  PAIN: Are you having pain? Yes: NPRS scale: 2/10 Pain location: head Pain description: "headache" Aggravating factors: unable to identify Relieving factors: medication  LIVING ENVIRONMENT: Lives with:  lives with adult daughter who has autism, adult son is staying with pt at this point to provide assistance as needed Lives in: Other town house Stairs: Yes: Internal: full flight downstairs  to laundry, but she does not need to go down there at this time steps; on right going up and External: 3 steps; on right going up and can reach both Has following equipment at home: Gilford Rile - 2  wheeled  PLOF: Independent  PATIENT GOALS: to be completely independent   OBJECTIVE:  --------------------------------------------------------------------------------------------------------------------------------------------------------  HAND DOMINANCE: Right  ADLs: Overall ADLs: requires increased time to complete all ADLs, but is able to complete at Mod I level to supervision when showering Equipment: Walk in shower and built-in shower seat  IADLs: Light housekeeping: family is assisting at this time Meal Prep: family is currently completing all meal prep Community mobility: not cleared to drive Medication management: family is dispensing meds Financial management: family is completing at this time Handwriting: Increased time  COGNITION: Overall cognitive status: Within functional limits for tasks assessed BIMS: Oriented to day, month, and year  STM: able to recall sock, blue, bed  Recall (after 5 mins): able to recall sock, blue, bed  --------------------------------------------------------------------------------------------------------------------------------------------------------  TODAY'S TREATMENT: Pill box assessment:  Pt making no errors, did require 5:40 to complete with pt initially coming back to "one tablet every other day" to allow for further processing of instruction.  Pt reports filling up pill box at home independently. IADL: pt reports increased engagement in bill paying and meal prep.  Pt reports that she has cooked one meal  in the oven and that it went well.  OT encouraged pt to continue to engage herself in functional tasks that are motivating and to have supervision during initial attempts of more challenging tasks. Reviewed energy conservation strategies with IADLs. Return to driving: discussed recommendation to abstain from driving s/p seizure for 6 months.  Encouraged pt to follow up with either her PCP or any of the cancer doctors to identify start  date as pt asking questions about when the 6 months starts/ends. Completed SLUMS: Pt scored 22/30 with improvements in processing speed and ability to identify 9 animals in 1 minute time limit.  Pt able to recall increased details of story recall and improvements with clock drawing portion.  Pt continues to demonstrate min impairments with processing speed and word production due to aphasia.   PATIENT EDUCATION: Ongoing condition-specific education related to therapeutic interventions completed this session Person educated: Patient Education method: Explanation Education comprehension: verbalized understanding and needs further education   HOME EXERCISE PROGRAM: TBD   GOALS: Goals reviewed with patient? Yes  SHORT TERM GOALS: Target date: 05/09/22  Pt will verbalize understanding of energy conservation techniques  Baseline: Goal status: MET  2.  Pt will verbalize understanding of task modifications and/or potential A/E needs to increase ease, safety, and independence w/ ADLs and IADLs. Baseline:  Goal status: MET  3.  Pt will demonstrate improved sequencing and problem solving as demonstrated by improvements in score on pill box assessment by reduction of errors to </= to 1. Baseline:  Goal status: MET   LONG TERM GOALS: Target date: 05/30/22  Pt will demonstrate ability to sequence simple functional task (simple snack prep, laundry task, etc) at Mod I level with good safety awareness Baseline:  Goal status: NOT MET  2.  Pt will navigate a moderately busy environment, completing dual task activity and/or following multi-step commands with 90% accuracy Baseline:  Goal status: NOT MET  3.  Pt will complete table top task with improvements in safety and insight, problem solving and attention to increase independence with IADLs and possible return to driving PRN. Baseline: Goal status:  NOT MET  4.  Pt will demonstrate improvements in cognition as demonstrated by improvements  on SLUMS by from one category of impairment to lesser impairment. Baseline:  Goal status: MET  5.  Pt will report understanding of return to driving recommendations Baseline:  Goal status: MET    ASSESSMENT:  CLINICAL IMPRESSION: Pt is demonstrating improvements in dual tasking, executive functioning during IADLs with meal prep at home with waning supervision, and taking on increased engagement in medication management and bill paying.  Pt continues to demonstrate difficulties with attention and executive functioning, namely organizing and connecting information, self-monitoring, and functional recall/working memory. Recommend supervision with meal prep and higher level IADLs to ensure safety and reduction of errors.  Due to update in medical prognosis, pt and OT decided she will d/c from OT today.   PERFORMANCE DEFICITS in functional skills including ADLs, IADLs, balance, endurance, and decreased knowledge of precautions, cognitive skills including attention, emotional, problem solving, and thought, and psychosocial skills including interpersonal interactions.   IMPAIRMENTS are limiting patient from ADLs and IADLs.   COMORBIDITIES may have co-morbidities  that affects occupational performance. Patient will benefit from skilled OT to address above impairments and improve overall function.   PLAN: OT FREQUENCY: 1-2x/week  OT DURATION: 6 weeks  PLANNED INTERVENTIONS: self care/ADL training, therapeutic activity, functional mobility training, patient/family education, cognitive remediation/compensation, coping strategies training, and DME and/or AE instructions  RECOMMENDED OTHER SERVICES: N/A  CONSULTED AND AGREED WITH PLAN OF CARE: Patient  PLAN FOR NEXT SESSION: D/C   Simonne Come, OTR/L 05/07/2022, 4:38 PM

## 2022-05-08 ENCOUNTER — Other Ambulatory Visit (HOSPITAL_COMMUNITY): Payer: Self-pay

## 2022-05-08 ENCOUNTER — Other Ambulatory Visit: Payer: Self-pay | Admitting: Emergency Medicine

## 2022-05-08 ENCOUNTER — Telehealth: Payer: Self-pay | Admitting: Emergency Medicine

## 2022-05-08 DIAGNOSIS — C711 Malignant neoplasm of frontal lobe: Secondary | ICD-10-CM

## 2022-05-08 NOTE — Telephone Encounter (Signed)
[id number] A Single Arm, Pilot Study of Ramipril for Preventing Radiation-Induced Cognitive Decline in Glioblastoma (GBM) Patients Receiving Brain Radiotherapy  Called patient to f/u on interest in study.  Pt coming in at around 1130 tomorrow to consent/baseline visit.  Appts created.  Will call pt tomorrow morning to make sure she has a ride and is still interested in coming.  [name], RN, BSN Clinical Research Nurse I 05/08/22 1:13 PM

## 2022-05-09 ENCOUNTER — Inpatient Hospital Stay: Payer: Medicare Other

## 2022-05-09 ENCOUNTER — Ambulatory Visit: Payer: Medicare Other

## 2022-05-09 ENCOUNTER — Encounter: Payer: Self-pay | Admitting: Emergency Medicine

## 2022-05-09 ENCOUNTER — Inpatient Hospital Stay: Payer: Medicare Other | Admitting: Emergency Medicine

## 2022-05-09 ENCOUNTER — Ambulatory Visit: Payer: Medicare Other | Admitting: Physical Therapy

## 2022-05-09 ENCOUNTER — Other Ambulatory Visit: Payer: Self-pay

## 2022-05-09 DIAGNOSIS — C711 Malignant neoplasm of frontal lobe: Secondary | ICD-10-CM

## 2022-05-09 LAB — CMP (CANCER CENTER ONLY)
ALT: 13 U/L (ref 0–44)
AST: 13 U/L — ABNORMAL LOW (ref 15–41)
Albumin: 3.7 g/dL (ref 3.5–5.0)
Alkaline Phosphatase: 64 U/L (ref 38–126)
Anion gap: 6 (ref 5–15)
BUN: 19 mg/dL (ref 8–23)
CO2: 24 mmol/L (ref 22–32)
Calcium: 9.3 mg/dL (ref 8.9–10.3)
Chloride: 114 mmol/L — ABNORMAL HIGH (ref 98–111)
Creatinine: 1.06 mg/dL — ABNORMAL HIGH (ref 0.44–1.00)
GFR, Estimated: 58 mL/min — ABNORMAL LOW (ref >60)
Glucose, Bld: 94 mg/dL (ref 70–99)
Potassium: 3.9 mmol/L (ref 3.5–5.1)
Sodium: 144 mmol/L (ref 135–145)
Total Bilirubin: 0.5 mg/dL (ref 0.3–1.2)
Total Protein: 7.1 g/dL (ref 6.5–8.1)

## 2022-05-09 LAB — CBC WITH DIFFERENTIAL (CANCER CENTER ONLY)
Abs Immature Granulocytes: 0.03 10*3/uL (ref 0.00–0.07)
Basophils Absolute: 0 10*3/uL (ref 0.0–0.1)
Basophils Relative: 1 %
Eosinophils Absolute: 0.1 10*3/uL (ref 0.0–0.5)
Eosinophils Relative: 2 %
HCT: 34 % — ABNORMAL LOW (ref 36.0–46.0)
Hemoglobin: 11.1 g/dL — ABNORMAL LOW (ref 12.0–15.0)
Immature Granulocytes: 1 %
Lymphocytes Relative: 16 %
Lymphs Abs: 1 10*3/uL (ref 0.7–4.0)
MCH: 31.9 pg (ref 26.0–34.0)
MCHC: 32.6 g/dL (ref 30.0–36.0)
MCV: 97.7 fL (ref 80.0–100.0)
Monocytes Absolute: 0.5 10*3/uL (ref 0.1–1.0)
Monocytes Relative: 8 %
Neutro Abs: 4.6 10*3/uL (ref 1.7–7.7)
Neutrophils Relative %: 72 %
Platelet Count: 353 10*3/uL (ref 150–400)
RBC: 3.48 MIL/uL — ABNORMAL LOW (ref 3.87–5.11)
RDW: 12.3 % (ref 11.5–15.5)
WBC Count: 6.2 10*3/uL (ref 4.0–10.5)
nRBC: 0 % (ref 0.0–0.2)

## 2022-05-09 NOTE — Research (Unsigned)
[id number] A Single Arm, Pilot Study of Ramipril for Preventing Radiation-Induced Cognitive Decline in Glioblastoma (GBM) Patients Receiving Brain Radiotherapy  CONSENT  Patient Jennifer [name] was identified by this Clinical Research Nurse as a potential candidate for the above listed study.  This Clinical Research Nurse met with JATAVIA KELTNER, [id number] on 05/09/22 in a manner and location that ensures patient privacy to discuss participation in the above listed research study.  Patient is Accompanied by her friend .  Patient was previously provided with informed consent documents.  Patient confirmed they have read the informed consent documents.  As outlined in the informed consent form, this Nurse and Jacquelin Hawking discussed the purpose of the research study, the investigational nature of the study, study procedures and requirements for study participation, potential risks and benefits of study participation, as well as alternatives to participation.  This study is not blinded or double-blinded. The patient understands participation is voluntary and they may withdraw from study participation at any time.  This study does not involve randomization.  Potential side effects were reviewed with patient as outlined in the consent form, and patient made aware there may be side effects not yet known. This study does not involve a placebo. Patient understands enrollment is pending full eligibility review.   Confidentiality and how the patient's information will be used as part of study participation were discussed.  Patient was informed there is not reimbursement provided for their time and effort spent on trial participation.  The patient is encouraged to discuss research study participation with their insurance provider to determine what costs they may incur as part of study participation, including research related injury.    All questions were answered to patient's satisfaction.  The informed consent and  separate HIPAA Authorization was reviewed page by page.  The patient's mental and emotional status is appropriate to provide informed consent, and the patient verbalizes an understanding of study participation.  Patient has agreed to participate in the above listed research study and has voluntarily signed the informed consent version date 06/18/21 (cone date 04/11/22) and separate HIPAA Authorization, version date 06/30/17 (cone date 07/29/21)  on 05/09/22 at 12:35 PM.  The patient was provided with a copy of the signed informed consent form and separate HIPAA Authorization for their reference.  No study specific procedures were obtained prior to the signing of the informed consent document.  Approximately 90 minutes were spent with the patient reviewing the informed consent documents.  Patient was not requested to complete a Release of Information form.  [name], RN, BSN Clinical Research Nurse I 05/09/22 2:30 PM

## 2022-05-09 NOTE — Research (Unsigned)
[id number] A Single Arm, Pilot Study of Ramipril for Preventing Radiation-Induced Cognitive Decline in Glioblastoma (GBM) Patients Receiving Brain Radiotherapy   ELIGIBILITY CHECK   This Nurse has reviewed this patient's inclusion and exclusion criteria and confirmed Jennifer [name] is eligible for study participation.  Patient will continue with enrollment.   Patient confirmed the following verbally today (05/09/22): - Able to complete all testing in English - They are post-menopausal.  Their LMP was at age 66. - Patient has no known drug allergies - Patient does not have a solitary kidney or known renal artery stenosis - Patient is not currently using ACE inhibitor of angiotensin receptor blocker.  Confirmed that patient has not taken their losartan or amlodipine since 05/01/2022 and that patient will continue to remain off of medication. - Patient has not had prior chemo or radiosensitizers for cancer of head/neck region or prior radiotherapy to head/neck - Patient does not have severe active co-morbidity as defined in section 4.2.11 of protocol - Patient is not currently on/has been on in the last 30 days any other therapeutic clinical protocols - Patient does not have impaired decision-making capacity - Patient is not taking memantine, donepezil, and/or other meds prescribed for enhanced cognition   Confirmed the following with Dr. Mickeal Skinner today (05/12/22): - The tumor has a supratentorial component - The patient has recovered from the effects of surgery, post-op infection, and other complications - Patient will not be treated with Optune at this time    Eligibility confirmed by treating investigator, who also agrees that patient should proceed with enrollment.  FINAL ELIGIBILITY CHECK DONE 05/12/22- CONFIRMED NO CHANGES IN ANY OF ELIGIBILITY CRITERIA  [name], RN, BSN Clinical Research Nurse I 05/12/22 3:20 PM   This Nurse has reviewed this patient's inclusion and exclusion  criteria as a second review and confirms Jennifer [name] is eligible for study participation.  Patient may continue with enrollment.   Maxwell Marion, RN, BSN, [place] Clinical Research Nurse Lead 05/12/2022 3:41 PM

## 2022-05-09 NOTE — Research (Unsigned)
[id number] A Single Arm, Pilot Study of Ramipril for Preventing Radiation-Induced Cognitive Decline in Glioblastoma (GBM) Patients Receiving Brain Radiotherapy   BASELINE   Patient arrives today accompanied by a friend for the Baseline visit.    PROs: Per study protocol, all PROs required for this visit were completed and completeness has been verified.  Patient also completed Demographic and Health Behaviors Questionnaire.   LABS: Mandatory labs were partially collected today (05/09/22) per consent and study protocol. Patient tolerated well without complaint.  Remaining labs (ApoE genotype) will be drawn on 05/12/22 before RT begins.   MEDICATION REVIEW: Patient reviews and verifies the current medication list is correct.  Reported changes were recorded on the medication list and marked as reviewed. All other listed medications remain the same. Confirmed that patient has not taken her losartan or amlodipine since 05/01/22. Patient has not started Temodar or Zofran yet as RT is not until 05/12/22.   MEDICAL HISTORY REVIEW:             Reviewed medical history with patient today.   VITAL SIGNS: Vital signs are collected per study protocol. BP today 120/88. Wt today 67.6 kg. Ht today 5'2".   NEUROCOGNITIVE ASSESSMENT: Neurocognitive assessment is completed by Remer Macho, Clinical Research Assistant.   DISPOSITION: Upon completion off all study requirements, patient was escorted to lab with belongings.    The patient was thanked for their time and continued voluntary participation in this study. Patient has been provided direct contact information and is encouraged to contact this Nurse for any needs or questions.   Once all labs have been completed on 05/12/22 the patient will be enrolled and study drug will be ordered.  Patient will be given Ramipril and drug diaries once drug arrives.  [name], RN, BSN Clinical Research Nurse I 05/09/22 2:40 PM

## 2022-05-12 ENCOUNTER — Inpatient Hospital Stay: Payer: Medicare Other

## 2022-05-12 ENCOUNTER — Other Ambulatory Visit: Payer: Self-pay | Admitting: Emergency Medicine

## 2022-05-12 ENCOUNTER — Other Ambulatory Visit: Payer: Self-pay

## 2022-05-12 ENCOUNTER — Encounter: Payer: Self-pay | Admitting: Internal Medicine

## 2022-05-12 ENCOUNTER — Ambulatory Visit
Admission: RE | Admit: 2022-05-12 | Discharge: 2022-05-12 | Disposition: A | Discharge status: Home / Self Care | Payer: Medicare Other | Source: Ambulatory Visit | Attending: Radiation Oncology | Admitting: Radiation Oncology

## 2022-05-12 DIAGNOSIS — C711 Malignant neoplasm of frontal lobe: Secondary | ICD-10-CM

## 2022-05-12 DIAGNOSIS — Z510 Encounter for antineoplastic radiation therapy: Secondary | ICD-10-CM | POA: Diagnosis not present

## 2022-05-12 LAB — RAD ONC ARIA SESSION SUMMARY
Course Elapsed Days: 0
Plan Fractions Treated to Date: 1
Plan Prescribed Dose Per Fraction: 2 Gy
Plan Total Fractions Prescribed: 23
Plan Total Prescribed Dose: 46 Gy
Reference Point Dosage Given to Date: 2 Gy
Reference Point Session Dosage Given: 2 Gy
Session Number: 1

## 2022-05-12 LAB — RESEARCH LABS

## 2022-05-12 NOTE — Research (Signed)
[id number] A Single Arm, Pilot Study of Ramipril for Preventing Radiation-Induced Cognitive Decline in Glioblastoma (GBM) Patients Receiving Brain Radiotherapy  REGISTRATION  Patient Jennifer [name], was registered to the above listed study, assigned study #[id number]. Randomization is not required for this study.  [name], RN, BSN Clinical Research Nurse I 05/12/22 3:57 PM

## 2022-05-13 ENCOUNTER — Other Ambulatory Visit: Payer: Self-pay

## 2022-05-13 ENCOUNTER — Ambulatory Visit: Payer: Medicare Other | Admitting: Occupational Therapy

## 2022-05-13 ENCOUNTER — Ambulatory Visit: Payer: Medicare Other | Admitting: Physical Therapy

## 2022-05-13 ENCOUNTER — Ambulatory Visit
Admission: RE | Admit: 2022-05-13 | Discharge: 2022-05-13 | Disposition: A | Discharge status: Home / Self Care | Payer: Medicare Other | Source: Ambulatory Visit | Attending: Radiation Oncology | Admitting: Radiation Oncology

## 2022-05-13 DIAGNOSIS — Z510 Encounter for antineoplastic radiation therapy: Secondary | ICD-10-CM | POA: Diagnosis not present

## 2022-05-13 DIAGNOSIS — C711 Malignant neoplasm of frontal lobe: Secondary | ICD-10-CM

## 2022-05-13 LAB — RAD ONC ARIA SESSION SUMMARY
Course Elapsed Days: 1
Plan Fractions Treated to Date: 2
Plan Prescribed Dose Per Fraction: 2 Gy
Plan Total Fractions Prescribed: 23
Plan Total Prescribed Dose: 46 Gy
Reference Point Dosage Given to Date: 4 Gy
Reference Point Session Dosage Given: 2 Gy
Session Number: 2

## 2022-05-14 ENCOUNTER — Other Ambulatory Visit: Payer: Self-pay

## 2022-05-14 ENCOUNTER — Ambulatory Visit
Admission: RE | Admit: 2022-05-14 | Discharge: 2022-05-14 | Disposition: A | Discharge status: Home / Self Care | Payer: Medicare Other | Source: Ambulatory Visit | Attending: Radiation Oncology | Admitting: Radiation Oncology

## 2022-05-14 DIAGNOSIS — Z510 Encounter for antineoplastic radiation therapy: Secondary | ICD-10-CM | POA: Diagnosis not present

## 2022-05-14 LAB — RAD ONC ARIA SESSION SUMMARY
Course Elapsed Days: 2
Plan Fractions Treated to Date: 3
Plan Prescribed Dose Per Fraction: 2 Gy
Plan Total Fractions Prescribed: 23
Plan Total Prescribed Dose: 46 Gy
Reference Point Dosage Given to Date: 6 Gy
Reference Point Session Dosage Given: 2 Gy
Session Number: 3

## 2022-05-15 ENCOUNTER — Ambulatory Visit
Admission: RE | Admit: 2022-05-15 | Discharge: 2022-05-15 | Disposition: A | Discharge status: Home / Self Care | Payer: Medicare Other | Source: Ambulatory Visit | Attending: Radiation Oncology | Admitting: Radiation Oncology

## 2022-05-15 ENCOUNTER — Other Ambulatory Visit: Payer: Self-pay

## 2022-05-15 ENCOUNTER — Encounter: Payer: Self-pay | Admitting: Internal Medicine

## 2022-05-15 DIAGNOSIS — Z510 Encounter for antineoplastic radiation therapy: Secondary | ICD-10-CM | POA: Diagnosis not present

## 2022-05-15 DIAGNOSIS — C711 Malignant neoplasm of frontal lobe: Secondary | ICD-10-CM

## 2022-05-15 LAB — RAD ONC ARIA SESSION SUMMARY
Course Elapsed Days: 3
Plan Fractions Treated to Date: 4
Plan Prescribed Dose Per Fraction: 2 Gy
Plan Total Fractions Prescribed: 23
Plan Total Prescribed Dose: 46 Gy
Reference Point Dosage Given to Date: 8 Gy
Reference Point Session Dosage Given: 2 Gy
Session Number: 4

## 2022-05-15 MED ORDER — INV-RAMIPRIL 1.25 MG CAP WF-1801 (#133): 1.2500 mg | ORAL_CAPSULE | Freq: Every day | ORAL | 0 refills | Status: DC

## 2022-05-15 NOTE — Progress Notes (Signed)
Pt here for patient teaching.  Pt given Radiation and You booklet, skin care instructions, and Sonafine.  Reviewed areas of pertinence such as fatigue, hair loss, nausea and vomiting, skin changes, headache, and blurry vision . Pt able to give teach back of to pat skin and use unscented/gentle soap,apply Sonafine bid and avoid applying anything to skin within 4 hours of treatment. Pt verbalizes understanding of information given and will contact nursing with any questions or concerns.  Her son was present for education.  Gloriajean Dell. Leonie Green, BSN

## 2022-05-15 NOTE — Addendum Note (Signed)
Addended by: Bo Mcclintock on: 05/15/2022 04:10 PM   Modules accepted: Orders

## 2022-05-15 NOTE — Research (Addendum)
[id number] A Single Arm, Pilot Study of Ramipril for Preventing Radiation-Induced Cognitive Decline in Glioblastoma (GBM) Patients Receiving Brain Radiotherapy   Met with patient when she presented for radiation for drug distribution + medication diary education.  Received study drug (ramipril) delivery today. Confirmed correct patient information ([id number]) with Carolynne Edouard, PharmD. Drug was dispensed in one bottle containing 133 1.101m capsules (rx no. [id number] lot [id number] exp. date 11/23/22). Distributed study drug to patient with instructions to take one pill daily at bedtime with or without food, starting tonight 05/15/22 and continuing through 05/21/22. Patient was given medication diary and instructions for completion. Patient verbalized understanding of all instructions, denied any questions or concerns at this time.  PLAN: Patient and family member also provided with calendar for remaining September appointments as requested. Patient instructed to continue attending radiation appointments as scheduled and to plan for labs to be collected on Tuesday, 05/20/22, and to meet with research on Thursday, 05/22/22. Patient understands to bring study drug and medication diary to appointments on 05/22/22 for week 1 titration/start of week 2 visit.  LVickii Penna RN, BSN, CPN Clinical Research Nurse I [phone] 05/15/2022 4:03 PM

## 2022-05-16 ENCOUNTER — Ambulatory Visit: Payer: Medicare Other | Admitting: Physical Therapy

## 2022-05-16 ENCOUNTER — Ambulatory Visit
Admission: RE | Admit: 2022-05-16 | Discharge: 2022-05-16 | Disposition: A | Discharge status: Home / Self Care | Payer: Medicare Other | Source: Ambulatory Visit | Attending: Radiation Oncology | Admitting: Radiation Oncology

## 2022-05-16 ENCOUNTER — Other Ambulatory Visit: Payer: Self-pay

## 2022-05-16 DIAGNOSIS — Z510 Encounter for antineoplastic radiation therapy: Secondary | ICD-10-CM | POA: Diagnosis not present

## 2022-05-16 DIAGNOSIS — C711 Malignant neoplasm of frontal lobe: Secondary | ICD-10-CM

## 2022-05-16 LAB — RAD ONC ARIA SESSION SUMMARY
Course Elapsed Days: 4
Plan Fractions Treated to Date: 5
Plan Prescribed Dose Per Fraction: 2 Gy
Plan Total Fractions Prescribed: 23
Plan Total Prescribed Dose: 46 Gy
Reference Point Dosage Given to Date: 10 Gy
Reference Point Session Dosage Given: 2 Gy
Session Number: 5

## 2022-05-16 MED ORDER — SONAFINE EX EMUL
1.0000 | Freq: Once | CUTANEOUS | Status: AC
  Administered 2022-05-16: 1 via TOPICAL

## 2022-05-19 ENCOUNTER — Other Ambulatory Visit: Payer: Self-pay

## 2022-05-19 ENCOUNTER — Other Ambulatory Visit: Payer: Medicare Other

## 2022-05-19 ENCOUNTER — Ambulatory Visit
Admission: RE | Admit: 2022-05-19 | Discharge: 2022-05-19 | Disposition: A | Discharge status: Home / Self Care | Payer: Medicare Other | Source: Ambulatory Visit | Attending: Radiation Oncology | Admitting: Radiation Oncology

## 2022-05-19 DIAGNOSIS — Z510 Encounter for antineoplastic radiation therapy: Secondary | ICD-10-CM | POA: Diagnosis not present

## 2022-05-19 LAB — RAD ONC ARIA SESSION SUMMARY
Course Elapsed Days: 7
Plan Fractions Treated to Date: 6
Plan Prescribed Dose Per Fraction: 2 Gy
Plan Total Fractions Prescribed: 23
Plan Total Prescribed Dose: 46 Gy
Reference Point Dosage Given to Date: 12 Gy
Reference Point Session Dosage Given: 2 Gy
Session Number: 6

## 2022-05-20 ENCOUNTER — Other Ambulatory Visit: Payer: Self-pay

## 2022-05-20 ENCOUNTER — Ambulatory Visit
Admission: RE | Admit: 2022-05-20 | Discharge: 2022-05-20 | Disposition: A | Discharge status: Home / Self Care | Payer: Medicare Other | Source: Ambulatory Visit | Attending: Radiation Oncology | Admitting: Radiation Oncology

## 2022-05-20 ENCOUNTER — Inpatient Hospital Stay: Payer: Medicare Other

## 2022-05-20 ENCOUNTER — Encounter: Payer: Medicare Other | Admitting: Occupational Therapy

## 2022-05-20 DIAGNOSIS — Z510 Encounter for antineoplastic radiation therapy: Secondary | ICD-10-CM | POA: Diagnosis not present

## 2022-05-20 DIAGNOSIS — C711 Malignant neoplasm of frontal lobe: Secondary | ICD-10-CM

## 2022-05-20 LAB — CMP (CANCER CENTER ONLY)
ALT: 11 U/L (ref 0–44)
AST: 13 U/L — ABNORMAL LOW (ref 15–41)
Albumin: 4 g/dL (ref 3.5–5.0)
Alkaline Phosphatase: 63 U/L (ref 38–126)
Anion gap: 6 (ref 5–15)
BUN: 21 mg/dL (ref 8–23)
CO2: 26 mmol/L (ref 22–32)
Calcium: 9.1 mg/dL (ref 8.9–10.3)
Chloride: 109 mmol/L (ref 98–111)
Creatinine: 1.15 mg/dL — ABNORMAL HIGH (ref 0.44–1.00)
GFR, Estimated: 53 mL/min — ABNORMAL LOW (ref >60)
Glucose, Bld: 118 mg/dL — ABNORMAL HIGH (ref 70–99)
Potassium: 3.7 mmol/L (ref 3.5–5.1)
Sodium: 141 mmol/L (ref 135–145)
Total Bilirubin: 0.4 mg/dL (ref 0.3–1.2)
Total Protein: 7 g/dL (ref 6.5–8.1)

## 2022-05-20 LAB — CBC WITH DIFFERENTIAL (CANCER CENTER ONLY)
Abs Immature Granulocytes: 0.11 10*3/uL — ABNORMAL HIGH (ref 0.00–0.07)
Basophils Absolute: 0.1 10*3/uL (ref 0.0–0.1)
Basophils Relative: 1 %
Eosinophils Absolute: 0.1 10*3/uL (ref 0.0–0.5)
Eosinophils Relative: 1 %
HCT: 36.7 % (ref 36.0–46.0)
Hemoglobin: 12.1 g/dL (ref 12.0–15.0)
Immature Granulocytes: 1 %
Lymphocytes Relative: 10 %
Lymphs Abs: 0.9 10*3/uL (ref 0.7–4.0)
MCH: 31.6 pg (ref 26.0–34.0)
MCHC: 33 g/dL (ref 30.0–36.0)
MCV: 95.8 fL (ref 80.0–100.0)
Monocytes Absolute: 0.7 10*3/uL (ref 0.1–1.0)
Monocytes Relative: 7 %
Neutro Abs: 8 10*3/uL — ABNORMAL HIGH (ref 1.7–7.7)
Neutrophils Relative %: 80 %
Platelet Count: 361 10*3/uL (ref 150–400)
RBC: 3.83 MIL/uL — ABNORMAL LOW (ref 3.87–5.11)
RDW: 12.4 % (ref 11.5–15.5)
WBC Count: 9.9 10*3/uL (ref 4.0–10.5)
nRBC: 0 % (ref 0.0–0.2)

## 2022-05-20 LAB — RAD ONC ARIA SESSION SUMMARY
Course Elapsed Days: 8
Plan Fractions Treated to Date: 7
Plan Prescribed Dose Per Fraction: 2 Gy
Plan Total Fractions Prescribed: 23
Plan Total Prescribed Dose: 46 Gy
Reference Point Dosage Given to Date: 14 Gy
Reference Point Session Dosage Given: 2 Gy
Session Number: 7

## 2022-05-20 LAB — RESEARCH LABS

## 2022-05-21 ENCOUNTER — Other Ambulatory Visit: Payer: Self-pay

## 2022-05-21 ENCOUNTER — Ambulatory Visit
Admission: RE | Admit: 2022-05-21 | Discharge: 2022-05-21 | Disposition: A | Discharge status: Home / Self Care | Payer: Medicare Other | Source: Ambulatory Visit | Attending: Radiation Oncology | Admitting: Radiation Oncology

## 2022-05-21 DIAGNOSIS — Z510 Encounter for antineoplastic radiation therapy: Secondary | ICD-10-CM | POA: Diagnosis not present

## 2022-05-21 LAB — RAD ONC ARIA SESSION SUMMARY
Course Elapsed Days: 9
Plan Fractions Treated to Date: 8
Plan Prescribed Dose Per Fraction: 2 Gy
Plan Total Fractions Prescribed: 23
Plan Total Prescribed Dose: 46 Gy
Reference Point Dosage Given to Date: 16 Gy
Reference Point Session Dosage Given: 2 Gy
Session Number: 8

## 2022-05-22 ENCOUNTER — Inpatient Hospital Stay: Payer: Medicare Other

## 2022-05-22 ENCOUNTER — Ambulatory Visit
Admission: RE | Admit: 2022-05-22 | Discharge: 2022-05-22 | Disposition: A | Discharge status: Home / Self Care | Payer: Medicare Other | Source: Ambulatory Visit | Attending: Radiation Oncology | Admitting: Radiation Oncology

## 2022-05-22 ENCOUNTER — Other Ambulatory Visit (HOSPITAL_COMMUNITY): Payer: Self-pay

## 2022-05-22 ENCOUNTER — Inpatient Hospital Stay (HOSPITAL_BASED_OUTPATIENT_CLINIC_OR_DEPARTMENT_OTHER): Payer: Medicare Other | Admitting: Internal Medicine

## 2022-05-22 ENCOUNTER — Other Ambulatory Visit: Payer: Medicare Other

## 2022-05-22 ENCOUNTER — Encounter: Payer: Self-pay | Admitting: Internal Medicine

## 2022-05-22 ENCOUNTER — Other Ambulatory Visit: Payer: Self-pay

## 2022-05-22 VITALS — BP 121/70 | HR 89 | Temp 98.0°F | Resp 18 | Ht 62.0 in | Wt 150.7 lb

## 2022-05-22 DIAGNOSIS — C711 Malignant neoplasm of frontal lobe: Secondary | ICD-10-CM

## 2022-05-22 DIAGNOSIS — Z510 Encounter for antineoplastic radiation therapy: Secondary | ICD-10-CM | POA: Diagnosis not present

## 2022-05-22 LAB — RAD ONC ARIA SESSION SUMMARY
Course Elapsed Days: 10
Plan Fractions Treated to Date: 9
Plan Prescribed Dose Per Fraction: 2 Gy
Plan Total Fractions Prescribed: 23
Plan Total Prescribed Dose: 46 Gy
Reference Point Dosage Given to Date: 18 Gy
Reference Point Session Dosage Given: 2 Gy
Session Number: 9

## 2022-05-22 MED ORDER — INV-RAMIPRIL 1.25 MG CAP WF-1801 (#133): 2.5000 mg | ORAL_CAPSULE | Freq: Every day | ORAL | 0 refills | Status: DC

## 2022-05-22 NOTE — Research (Signed)
DCP-001: Use of a Clinical Trial Screening Tool to Address Cancer Health Disparities in the [place] Program [facility])  Patient Jennifer [name] was identified by this RN as a potential candidate for the above listed study. This Clinical Research Nurse met with LAURIANN MILILLO, [id number], on 05/22/22 in a manner and location that ensures patient privacy to discuss participation in the above listed research study. Patient is Accompanied by her friend . A copy of the informed consent document and separate HIPAA Authorization was provided to the patient. Patient reads, speaks, and understands [place].    Patient was provided with the business card of this Nurse and encouraged to contact the research team with any questions.  Patient was provided the option of taking informed consent documents home to review and was encouraged to review at their convenience with their support network, including other care providers. Patient is comfortable with making a decision regarding study participation today.  As outlined in the informed consent form, this Nurse and Jacquelin Hawking discussed the purpose of the research study, the investigational nature of the study, study procedures and requirements for study participation, potential risks and benefits of study participation, as well as alternatives to participation. This study is not blinded. The patient understands participation is voluntary and they may withdraw from study participation at any time.  This study does not involve randomization.  This study does not involve an investigational drug or device. This study does not involve a placebo. Patient understands enrollment is pending full eligibility review.   Confidentiality and how the patient's information will be used as part of study participation were discussed. Patient was informed there is not reimbursement provided for their time and effort spent on trial participation. The patient is  encouraged to discuss research study participation with their insurance provider to determine what costs they may incur as part of study participation, including research related injury.    All questions were answered to patient's satisfaction. The informed consent and separate HIPAA Authorization was reviewed page by page. The patient's mental and emotional status is appropriate to provide informed consent, and the patient verbalizes an understanding of study participation. Patient has agreed to participate in the above listed research study and has voluntarily signed the informed consent version date 09/23/21 ([facility] active date 04/11/22) and separate HIPAA Authorization, version 5 (version date 06/04/21; [facility] active date 04/02/22)  on 05/22/22 at 1502PM. The patient was provided with a copy of the signed informed consent form and separate HIPAA Authorization for their reference. No study specific procedures were obtained prior to the signing of the informed consent document. Approximately 15 minutes were spent with the patient reviewing the informed consent documents. Patient was not requested to complete a Release of Information form.  Data collected after consent signed.  Vickii Penna, RN, BSN, CPN Clinical Research Nurse I [phone]  05/22/2022 4:02 PM

## 2022-05-22 NOTE — Progress Notes (Signed)
[facility] [place], [place] [address] [phone]   Interval Evaluation  Date of Service: 05/22/22 [name] Patient MRN: [id number] Patient DOB: [birth year] Provider: Ventura Sellers, MD  Identifying Statement:  Jennifer [name] is a 66 y.o. female with left frontal glioblastoma   Oncologic History: Oncology History  Glioblastoma multiforme of frontal lobe ([place])  04/11/2022 Surgery   Craniotomy, resection of left frontal mass with Dr. Christella Noa; path is glioblastoma IDHwt.   05/12/2022 -  Chemotherapy   Patient is on Treatment Plan : BRAIN GLIOBLASTOMA Radiation Therapy With Concurrent Temozolomide 75 mg/m2 Daily Followed By Sequential Maintenance Temozolomide x 6-12 cycles       Biomarkers:  MGMT Unknown.  IDH 1/2 Wild type.  EGFR Unknown  TERT Unknown   Interval History: Jennifer [name] presents today for follow up, now having completed 2 weeks of radiation and Temodar.  She is tolerating treatment well overall.  No new or progressive neurologic complaints.  Denies headaches or seizures.    H+P (05/05/22) Patient presented to neurologic attention in mid-August 2023 with new onset difficulty speaking, confusion noticed by family.  She and her family described sudden onset speech impairment and confusion in the afternoon of the 16th, after having been seen in normal state of health that morning.  CNS imaging demonstrated an enhancing left frontal mass, which was resected by Dr. Christella Noa on 04/11/22.  Following surgery she had some speech difficulty, but improved quickly through rehab admission.  At present, she is fully independent with gait, speech, ADLs.  She is off decadron and Keppra.  Retired, had worked at [employer] in [employer].  Today presents with son and daughter in law.    Medications: Current Outpatient Medications on File Prior to Visit  Medication Sig Dispense Refill   amLODipine (NORVASC) 5 MG  tablet TAKE 1 TABLET BY MOUTH EVERY DAY (Patient not taking: Reported on 05/06/2022) 90 tablet 1   atorvastatin (LIPITOR) 10 MG tablet TAKE 1 TABLET BY MOUTH EVERY DAY (Patient taking differently: Take 10 mg by mouth daily.) 90 tablet 1   busPIRone (BUSPAR) 5 MG tablet TAKE 1 TABLET BY MOUTH THREE TIMES A DAY (Patient taking differently: Take 5 mg by mouth 3 (three) times daily.) 270 tablet 1   fluticasone (FLONASE) 50 MCG/ACT nasal spray SPRAY 2 SPRAYS INTO EACH NOSTRIL EVERY DAY (Patient taking differently: Place 2 sprays into both nostrils daily.) 48 mL 0   Investigational Ramipril 1.25 MG capsule [id number] Take 1 capsule (1.25 mg total) by mouth at bedtime for 7 days. Take at bedtime with or without food. Drink adequate water to avoid dehydration. Avoid salt substitutes that are high in potassium. 133 capsule 0   losartan (COZAAR) 50 MG tablet TAKE 1 TABLET BY MOUTH EVERY DAY (Patient not taking: Reported on 05/06/2022) 90 tablet 1   ondansetron (ZOFRAN) 8 MG tablet Take 1 tablet (8 mg total) by mouth every 8 (eight) hours as needed for nausea or vomiting. May take 30-60 minutes prior to Temodar administration if nausea/vomiting occurs as needed. 30 tablet 1   oxybutynin (DITROPAN-XL) 5 MG 24 hr tablet TAKE 1 TABLET BY MOUTH EVERYDAY AT BEDTIME (Patient not taking: Reported on 05/05/2022) 90 tablet 1   temozolomide (TEMODAR) 100 MG capsule Take 1 capsule (100 mg total) by mouth daily. May take on an empty stomach to decrease nausea & vomiting. 42 capsule 0   temozolomide (TEMODAR) 20 MG capsule Take  1 capsule (20 mg total) by mouth daily. May take on an empty stomach to decrease nausea & vomiting. 42 capsule 0   topiramate (TOPAMAX) 50 MG tablet TAKE 1 TABLET BY MOUTH TWICE A DAY (Patient taking differently: Take 50 mg by mouth 2 (two) times daily.) 60 tablet 0   traMADol (ULTRAM) 50 MG tablet Take 50 mg by mouth every 8 (eight) hours as needed.     traZODone (DESYREL) 50 MG tablet Take 1 tablet (50 mg  total) by mouth at bedtime as needed for sleep. 30 tablet 2   No current facility-administered medications on file prior to visit.    Allergies: No Known Allergies Past Medical History:  Past Medical History:  Diagnosis Date   Hyperlipidemia    Hypertension    Migraine    Past Surgical History:  Past Surgical History:  Procedure Laterality Date   APPENDECTOMY     APPLICATION OF CRANIAL NAVIGATION N/A 04/11/2022   Procedure: APPLICATION OF CRANIAL NAVIGATION;  Surgeon: Ashok Pall, MD;  Location: [facility];  Service: Neurosurgery;  Laterality: N/A;   CRANIOTOMY Left 04/11/2022   Procedure: LEFT FRONTAL CRANIOTOMY FOR RESECTION OF TUMOR WITH Lucky Rathke;  Surgeon: Ashok Pall, MD;  Location: [facility];  Service: Neurosurgery;  Laterality: Left;  RM 21   LAPAROSCOPIC APPENDECTOMY  07/15/2012   Procedure: APPENDECTOMY LAPAROSCOPIC;  Surgeon: Zenovia Jarred, MD;  Location: [facility];  Service: General;  Laterality: N/A;  Laparoscopic Appendectomy   Social History:  Social History   Socioeconomic History   Marital status: Widowed    Spouse name: Not on file   Number of children: 2   Years of education: Not on file   Highest education level: Not on file  Occupational History   Occupation: retail    Comment: [employer] in [place], [place]  Tobacco Use   Smoking status: Never   Smokeless tobacco: Never  Substance and Sexual Activity   Alcohol use: Yes    Comment: 2 drinks once every 2-3 months   Drug use: No   Sexual activity: Not on file  Other Topics Concern   Not on file  Social History Narrative   Marital status: widowed since 2008; husband was bipolar and alcoholic      Children: 2 children (28, 2); son lives with patient.Daughter lives in [place]/autism/does not drive.  Daughter lives with mother-in-law.  One grandpuppy      Lives: with son      Employment: works at [employer]; also works in [place].      Tobacco: none      Alcohol: none; husband was an  alcoholic.      Drugs:   none      Exercise:   Social Determinants of Health   Financial Resource Strain: Not on file  Food Insecurity: Not on file  Transportation Needs: Not on file  Physical Activity: Not on file  Stress: Not on file  Social Connections: Not on file  Intimate Partner Violence: Not on file   Family History:  Family History  Adopted: Yes    Review of Systems: Constitutional: Doesn't report fevers, chills or abnormal weight loss Eyes: Doesn't report blurriness of vision Ears, nose, mouth, throat, and face: Doesn't report sore throat Respiratory: Doesn't report cough, dyspnea or wheezes Cardiovascular: Doesn't report palpitation, chest discomfort  Gastrointestinal:  Doesn't report nausea, constipation, diarrhea GU: Doesn't report incontinence Skin: Doesn't report skin rashes Neurological: Per HPI Musculoskeletal: Doesn't report joint pain Behavioral/Psych: Doesn't report anxiety  Physical  Exam: Vitals:   05/22/22 1518  BP: 121/70  Pulse: 89  Resp: 18  Temp: 98 F (36.7 C)  SpO2: 97%   KPS: 80 ECOG: 1 General: Alert, cooperative, pleasant, in no acute distress Head: Normal EENT: No conjunctival injection or scleral icterus.  Lungs: Resp effort normal Cardiac: Regular rate Abdomen: Non-distended abdomen Skin: No rashes cyanosis or petechiae. Extremities: No clubbing or edema  Neurologic Exam: Mental Status: Awake, alert, attentive to examiner. Oriented to self and environment. Language is fluent with intact comprehension.  Pseudobulbar elements. Cranial Nerves: Visual acuity is grossly normal. Visual fields are full. Extra-ocular movements intact. No ptosis. Face is symmetric Motor: Tone and bulk are normal. Power is full in both arms and legs. Reflexes are symmetric, no pathologic reflexes present.  Sensory: Intact to light touch Gait: Normal.   Labs: I have reviewed the data as listed    Component Value Date/Time   NA 141 05/20/2022 1512    NA 140 10/04/2018 1703   K 3.7 05/20/2022 1512   CL 109 05/20/2022 1512   CO2 26 05/20/2022 1512   GLUCOSE 118 (H) 05/20/2022 1512   BUN 21 05/20/2022 1512   BUN 24 10/04/2018 1703   CREATININE 1.15 (H) 05/20/2022 1512   CREATININE 1.06 (H) 01/29/2016 1518   CALCIUM 9.1 05/20/2022 1512   PROT 7.0 05/20/2022 1512   PROT 6.8 10/04/2018 1703   ALBUMIN 4.0 05/20/2022 1512   ALBUMIN 4.6 10/04/2018 1703   AST 13 (L) 05/20/2022 1512   ALT 11 05/20/2022 1512   ALKPHOS 63 05/20/2022 1512   BILITOT 0.4 05/20/2022 1512   GFRNONAA 53 (L) 05/20/2022 1512   GFRNONAA 58 (L) 01/29/2016 1518   GFRAA 70 10/04/2018 1703   GFRAA 67 01/29/2016 1518   Lab Results  Component Value Date   WBC 9.9 05/20/2022   NEUTROABS 8.0 (H) 05/20/2022   HGB 12.1 05/20/2022   HCT 36.7 05/20/2022   MCV 95.8 05/20/2022   PLT 361 05/20/2022      Assessment/Plan Glioblastoma multiforme of frontal lobe ([facility])  Jennifer [name] is clinically stable today.  Labs are within normal limits.  We ultimately recommended continuing with course of intensity modulated radiation therapy and concurrent daily Temozolomide.  Radiation will be administered Mon-Fri over 6 weeks, Temodar will be dosed at 70m/m2 to be given daily over 42 days.  We reviewed side effects of temodar, including fatigue, nausea/vomiting, constipation, and cytopenias.  Chemotherapy should be held for the following:  ANC less than 1,000  Platelets less than 100,000  LFT or creatinine greater than 2x ULN  If clinical concerns/contraindications develop  Every 2 weeks during radiation, labs will be checked accompanied by a clinical evaluation in the brain tumor clinic.  We recommended she continue Topamax 552mBID for migraine and seizure prevention, she has been on this for a number of years.  Ok with Trazodone 5040mS for insomnia, we reviewed sleep hygiene as well.  She will continue with "Testing Ramipril to Prevent Memory Loss in Patients  with Glioblastoma"  study protocol.  All questions were answered. The patient knows to call the clinic with any problems, questions or concerns. No barriers to learning were detected.  The total time spent in the encounter was 30 minutes and more than 50% was on counseling and review of test results   ZacVentura SellersD Medical Director of Neuro-Oncology [facility] Wes[place]ahr/28/23 3:18 PM

## 2022-05-22 NOTE — Research (Signed)
[id number] A Single Arm, Pilot Study of Ramipril for Preventing Radiation-Induced Cognitive Decline in Glioblastoma (GBM) Patients Receiving Brain Radiotherapy  Patient presented to clinic accompanied by her friend for week 2 (week 1 titration) visit.   LABS: Mandatory labs ([facility] CMP) collected per consent and study protocol on 05/20/22. Standard of care labs were also collected per MD. Patient tolerated well without complaint.  VITAL SIGNS: Blood pressure today was 122/78.   CONCOMITANT MEDICATIONS: Concomitant medications reviewed. Patient verified that current medication list is correct. Patient has been taking temodar '120mg'$  daily since 05/11/22. Confirmed that she had taken all required doses this week. Patient has zofran '8mg'$  PRN on her medication list; stated she has been taking it prior to her temodar since 05/12/22.   ADVERSE EVENTS: Adverse events assessed per protocol. Patient reports mild nausea (grade 1) since starting temodar. She states that this is well-managed with zofran PRN. CMP results from 05/20/22 show mild hyperglycemia (grade 1). Per Dr Mickeal Skinner, neither of these adverse events are related to treatment with the study drug; no action or reporting is required at this time. See table below.  ADVERSE EVENT LOG: Study/Protocol: CH8527  Week 2 (Week 1 Titration)   Baseline: hypertension (grade 1); insomnia (grade 2).   Event Grade CTCAE v5.0 Onset Date Resolved Date Attribution to Study Treatment Treatment Comments  Nausea 1 05/11/22 Ongoing Unrelated Zofran PRN   Hyperglycemia 1 05/20/22 Ongoing Unrelated None    STUDY DRUG: Patient returned study drug bottle (lot #[id number]) with 126 capsules remaining (133 total capsules dispensed - 126 remaining = 7 capsules taken); count confirmed with Carolynne Edouard, PharmD. Medication diary reflects the same: seven capsules taken for a compliance rate of 100%. Bottle (lot [id number]) returned to patient. Patient also provided with original  medication diaries for September and October. Per protocol, patient is eligible to titrate dose to two pills daily, starting tonight 05/22/22 and continuing through 05/28/22. Patient verbalized understanding of all instructions, denied any questions or concerns at this time.   PLAN: Upon completion of all study requirements, patient and friend were escorted to the lobby for scheduled visit with Dr Mickeal Skinner, followed by daily radiation. Patient provided with calendar for October appointments as requested. Patient instructed to continue attending radiation appointments as scheduled and to plan for labs to be collected on Tuesday, 05/27/22, and to meet with research on Thursday, 05/29/22. Patient understands to bring study drug and medication diaries to appointments on 05/29/22 for week 3 (week 2 titration) visit. Patient thanked for her time and continued voluntary participation in this study. Patient has been provided direct contact information and is encouraged to contact this nurse for any questions or concerns.  Vickii Penna, RN, BSN, CPN Clinical Research Nurse I [phone]  05/22/2022 3:55 PM

## 2022-05-23 ENCOUNTER — Ambulatory Visit
Admission: RE | Admit: 2022-05-23 | Discharge: 2022-05-23 | Disposition: A | Discharge status: Home / Self Care | Payer: Medicare Other | Source: Ambulatory Visit | Attending: Radiation Oncology | Admitting: Radiation Oncology

## 2022-05-23 ENCOUNTER — Other Ambulatory Visit: Payer: Self-pay

## 2022-05-23 DIAGNOSIS — Z510 Encounter for antineoplastic radiation therapy: Secondary | ICD-10-CM | POA: Diagnosis not present

## 2022-05-23 LAB — RAD ONC ARIA SESSION SUMMARY
Course Elapsed Days: 11
Plan Fractions Treated to Date: 10
Plan Prescribed Dose Per Fraction: 2 Gy
Plan Total Fractions Prescribed: 23
Plan Total Prescribed Dose: 46 Gy
Reference Point Dosage Given to Date: 20 Gy
Reference Point Session Dosage Given: 2 Gy
Session Number: 10

## 2022-05-26 ENCOUNTER — Other Ambulatory Visit: Payer: Medicare Other

## 2022-05-26 ENCOUNTER — Ambulatory Visit: Admission: RE | Admit: 2022-05-26 | Payer: Medicare Other | Source: Ambulatory Visit

## 2022-05-26 ENCOUNTER — Other Ambulatory Visit: Payer: Self-pay

## 2022-05-27 ENCOUNTER — Other Ambulatory Visit: Payer: Self-pay

## 2022-05-27 ENCOUNTER — Inpatient Hospital Stay: Payer: Medicare Other | Attending: Neurosurgery

## 2022-05-27 ENCOUNTER — Encounter: Payer: Medicare Other | Admitting: Occupational Therapy

## 2022-05-27 ENCOUNTER — Ambulatory Visit
Admission: RE | Admit: 2022-05-27 | Discharge: 2022-05-27 | Disposition: A | Discharge status: Home / Self Care | Payer: Medicare Other | Source: Ambulatory Visit | Attending: Radiation Oncology | Admitting: Radiation Oncology

## 2022-05-27 DIAGNOSIS — C711 Malignant neoplasm of frontal lobe: Secondary | ICD-10-CM | POA: Diagnosis present

## 2022-05-27 DIAGNOSIS — G4700 Insomnia, unspecified: Secondary | ICD-10-CM | POA: Insufficient documentation

## 2022-05-27 DIAGNOSIS — Z79899 Other long term (current) drug therapy: Secondary | ICD-10-CM | POA: Insufficient documentation

## 2022-05-27 DIAGNOSIS — G43709 Chronic migraine without aura, not intractable, without status migrainosus: Secondary | ICD-10-CM | POA: Insufficient documentation

## 2022-05-27 DIAGNOSIS — Z510 Encounter for antineoplastic radiation therapy: Secondary | ICD-10-CM | POA: Insufficient documentation

## 2022-05-27 LAB — RAD ONC ARIA SESSION SUMMARY
Course Elapsed Days: 15
Plan Fractions Treated to Date: 11
Plan Prescribed Dose Per Fraction: 2 Gy
Plan Total Fractions Prescribed: 23
Plan Total Prescribed Dose: 46 Gy
Reference Point Dosage Given to Date: 22 Gy
Reference Point Session Dosage Given: 2 Gy
Session Number: 11

## 2022-05-27 LAB — RESEARCH LABS

## 2022-05-28 ENCOUNTER — Ambulatory Visit
Admission: RE | Admit: 2022-05-28 | Discharge: 2022-05-28 | Disposition: A | Discharge status: Home / Self Care | Payer: Medicare Other | Source: Ambulatory Visit | Attending: Radiation Oncology | Admitting: Radiation Oncology

## 2022-05-28 ENCOUNTER — Other Ambulatory Visit: Payer: Self-pay

## 2022-05-28 ENCOUNTER — Telehealth: Payer: Self-pay | Admitting: *Deleted

## 2022-05-28 DIAGNOSIS — C711 Malignant neoplasm of frontal lobe: Secondary | ICD-10-CM

## 2022-05-28 DIAGNOSIS — Z510 Encounter for antineoplastic radiation therapy: Secondary | ICD-10-CM | POA: Diagnosis not present

## 2022-05-28 LAB — RAD ONC ARIA SESSION SUMMARY
Course Elapsed Days: 16
Plan Fractions Treated to Date: 12
Plan Prescribed Dose Per Fraction: 2 Gy
Plan Total Fractions Prescribed: 23
Plan Total Prescribed Dose: 46 Gy
Reference Point Dosage Given to Date: 24 Gy
Reference Point Session Dosage Given: 2 Gy
Session Number: 12

## 2022-05-28 NOTE — Telephone Encounter (Signed)
Patient's daughter in law Lucina Mellow called, LVM with following: She said at patient's appt last week with Dr. Mickeal Skinner, he discussed with them and patient starting steroids if changes in her cognition noted.    Ms. Ronnald Ramp said family is noticing those changes and think maybe patient should begin steroids.   Contacted Ms. Ronnald Ramp and informed her that Dr. Mickeal Skinner out of office at this time. This message will be sent to him.  Ms. Ronnald Ramp verbalized understanding of this information.   Call message routed to Dr. Mickeal Skinner

## 2022-05-29 ENCOUNTER — Encounter: Payer: Self-pay | Admitting: Internal Medicine

## 2022-05-29 ENCOUNTER — Other Ambulatory Visit: Payer: Self-pay

## 2022-05-29 ENCOUNTER — Ambulatory Visit
Admission: RE | Admit: 2022-05-29 | Discharge: 2022-05-29 | Disposition: A | Discharge status: Home / Self Care | Payer: Medicare Other | Source: Ambulatory Visit | Attending: Radiation Oncology | Admitting: Radiation Oncology

## 2022-05-29 ENCOUNTER — Inpatient Hospital Stay: Payer: Medicare Other

## 2022-05-29 DIAGNOSIS — Z510 Encounter for antineoplastic radiation therapy: Secondary | ICD-10-CM | POA: Diagnosis not present

## 2022-05-29 DIAGNOSIS — C711 Malignant neoplasm of frontal lobe: Secondary | ICD-10-CM

## 2022-05-29 LAB — RAD ONC ARIA SESSION SUMMARY
Course Elapsed Days: 17
Plan Fractions Treated to Date: 13
Plan Prescribed Dose Per Fraction: 2 Gy
Plan Total Fractions Prescribed: 23
Plan Total Prescribed Dose: 46 Gy
Reference Point Dosage Given to Date: 26 Gy
Reference Point Session Dosage Given: 2 Gy
Session Number: 13

## 2022-05-29 NOTE — Research (Signed)
[id number] A Single Arm, Pilot Study of Ramipril for Preventing Radiation-Induced Cognitive Decline in Glioblastoma (GBM) Patients Receiving Brain Radiotherapy   Patient presented to clinic accompanied by her daughter-in-law for week 3 (week 2 titration) visit.    LABS: Mandatory labs ([facility] CMP) collected per consent and study protocol on 05/27/22. Patient tolerated well without complaint.   VITAL SIGNS: Blood pressure today was 124/80.    CONCOMITANT MEDICATIONS: Concomitant medications reviewed. Patient verified that current medication list is correct; denied any new medications. Patient has been taking temodar '120mg'$  daily since 05/11/22. Confirmed that she had taken all required doses this week.    ADVERSE EVENTS: Adverse events assessed per protocol. Patient reports ongoing mild nausea (grade 1) since starting temodar. She states that this continues to be well-managed with zofran PRN. Patient also reported mild alopecia (grade 1). Per Dr Mickeal Skinner, neither of these adverse events are related to treatment with the study drug; no action or reporting is required at this time. See table below. Lab-related AEs will be assessed once [facility] CMP results are received.    ADVERSE EVENT LOG: Study/Protocol: [id number]  Week 3 (Week 2 Titration)   Baseline: hypertension (grade 1); insomnia (grade 2).   Event Grade CTCAE v5.0 Onset Date Resolved Date Attribution to Study Treatment Treatment Comments  Nausea 1 05/11/22 Ongoing Unrelated Zofran PRN    Alopecia 1 05/25/22 Ongoing Unrelated  Patient reported that her hair has started to thin.     STUDY DRUG: Patient returned study drug bottle (lot #[id number]) with 112 capsules remaining (133 total capsules dispensed - 112 remaining = 21 total capsules taken); count confirmed with Kennith Center, PharmD. Medication diary reflects the same: 14 capsules taken (7 doses) during week 2 for a compliance rate of 100%. Bottle (lot [id number]) returned to patient. Returned original  medication diary for October to patient; original September diary was completed, signed by patient, and collected by research. As [facility] CMP results are not available yet, patient must remain at current dose of two pills daily per protocol. Patient informed that she will be contacted as soon as CMP results are received and reviewed with instructions to titrate if eligible, which will most likely be tomorrow, 05/30/22. Patient verbalized understanding of all instructions, denied any questions or concerns at this time.    PLAN: Upon completion of all study requirements, patient was escorted to the lobby for daily radiation. Patient instructed to continue attending radiation appointments as scheduled and to plan for labs to be collected on Tuesday, 06/03/22, and to meet with research on Thursday, 06/05/22. Patient understands to bring study drug and medication diaries to appointments on 06/05/22 for week 4 (week 3 titration) visit. Patient thanked for her time and continued voluntary participation in this study. Patient has been provided direct contact information and is encouraged to contact this nurse for any questions or concerns.  Jennifer Penna, RN, BSN, CPN Clinical Research Nurse I [phone]  05/29/2022 4:20 PM   ADDENDUM 05/30/22 @ 1611 (Research RN Donell Sievert)  Unable to obtain full CMP results from [facility] in time for patient's appt.  Dr. Mickeal Skinner made aware, gave verbal order to remain at 2 pills daily (2.5 mg) dose instead of titrating up to 4 pills daily (5.0 mg) dose.  Patient was made aware to continue taking 2 pills daily dose at this time.  Patient and family member (son present) verbalized understanding.  Treatment plan for 4 pills daily titration was not released today as patient remaining on 2 pills daily  dose at this time.  Jennifer Guiles 'Learta CoddingNeysa Bonito, RN, BSN Clinical Research Nurse I 05/30/22 4:14 PM

## 2022-05-30 ENCOUNTER — Ambulatory Visit
Admission: RE | Admit: 2022-05-30 | Discharge: 2022-05-30 | Disposition: A | Discharge status: Home / Self Care | Payer: Medicare Other | Source: Ambulatory Visit | Attending: Radiation Oncology | Admitting: Radiation Oncology

## 2022-05-30 ENCOUNTER — Other Ambulatory Visit: Payer: Self-pay

## 2022-05-30 ENCOUNTER — Telehealth: Payer: Self-pay | Admitting: Emergency Medicine

## 2022-05-30 ENCOUNTER — Encounter: Payer: Self-pay | Admitting: Internal Medicine

## 2022-05-30 DIAGNOSIS — Z510 Encounter for antineoplastic radiation therapy: Secondary | ICD-10-CM | POA: Diagnosis not present

## 2022-05-30 LAB — RAD ONC ARIA SESSION SUMMARY
Course Elapsed Days: 18
Plan Fractions Treated to Date: 14
Plan Prescribed Dose Per Fraction: 2 Gy
Plan Total Fractions Prescribed: 23
Plan Total Prescribed Dose: 46 Gy
Reference Point Dosage Given to Date: 28 Gy
Reference Point Session Dosage Given: 2 Gy
Session Number: 14

## 2022-05-30 NOTE — Telephone Encounter (Signed)
[id number] A Single Arm, Pilot Study of Ramipril for Preventing Radiation-Induced Cognitive Decline in Glioblastoma (GBM) Patients Receiving Brain Radiotherapy  [facility] CMP results were not received in time to titrate the patient's weekly Ramipril dose up from 2 pills daily (2.5 mg) to 4 pills daily (5.0 mg) per protocol.  Reviewed with Dr. Mickeal Skinner who gave verbal order via telephone that patient should remain on 2 pills daily (2.5 mg) dose at this time.  Will update patient during RT visit today.  [name], RN, BSN Clinical Research Nurse I 05/30/22 3:47 PM

## 2022-06-02 ENCOUNTER — Other Ambulatory Visit: Payer: Self-pay

## 2022-06-02 ENCOUNTER — Ambulatory Visit
Admission: RE | Admit: 2022-06-02 | Discharge: 2022-06-02 | Disposition: A | Discharge status: Home / Self Care | Payer: Medicare Other | Source: Ambulatory Visit | Attending: Radiation Oncology | Admitting: Radiation Oncology

## 2022-06-02 ENCOUNTER — Ambulatory Visit: Payer: Medicare Other | Admitting: Occupational Therapy

## 2022-06-02 ENCOUNTER — Ambulatory Visit: Payer: Medicare Other

## 2022-06-02 ENCOUNTER — Encounter: Payer: Self-pay | Admitting: Internal Medicine

## 2022-06-02 DIAGNOSIS — C711 Malignant neoplasm of frontal lobe: Secondary | ICD-10-CM

## 2022-06-02 DIAGNOSIS — Z510 Encounter for antineoplastic radiation therapy: Secondary | ICD-10-CM | POA: Diagnosis not present

## 2022-06-02 LAB — RAD ONC ARIA SESSION SUMMARY
Course Elapsed Days: 21
Plan Fractions Treated to Date: 15
Plan Prescribed Dose Per Fraction: 2 Gy
Plan Total Fractions Prescribed: 23
Plan Total Prescribed Dose: 46 Gy
Reference Point Dosage Given to Date: 30 Gy
Reference Point Session Dosage Given: 2 Gy
Session Number: 15

## 2022-06-02 MED ORDER — INV-RAMIPRIL 1.25 MG CAP WF-1801 (#133): 5.0000 mg | ORAL_CAPSULE | Freq: Every day | ORAL | 0 refills | Status: DC

## 2022-06-02 NOTE — Research (Addendum)
[id number] A Single Arm, Pilot Study of Ramipril for Preventing Radiation-Induced Cognitive Decline in Glioblastoma (GBM) Patients Receiving Brain Radiotherapy   Met with patient and her daughter-in-law during scheduled RT appointment to discuss outstanding lab results and medication dosing from week 3 (week 2 titration) visit.    LABS: Mandatory labs ([facility] CMP) collected per consent and study protocol on 05/27/22. Lab results received by research staff on 06/02/22.    ADVERSE EVENTS: For adverse events collected at initial week 3 visit, see research encounter dated 05/29/22. After [facility] CMP results received on 06/02/22, hypernatremia (grade 1) noted. Per Dr Mickeal Skinner, this is not related to treatment with the study drug; no action or reporting is required at this time. See table below.    ADVERSE EVENT LOG: Study/Protocol: [id number]  Week 3 (Week 2 Titration) - outstanding lab-related AEs not previously documented   Baseline: hypertension (grade 1); insomnia (grade 2).   Event Grade CTCAE v5.0 Onset Date Resolved Date Attribution to Study Treatment Treatment Comments  Hypernatremia 1 05/27/22 Ongoing Unrelated N/A     STUDY DRUG: After reviewing [facility] CMP results, patient is eligible to titrate dose to four pills daily, starting tonight 06/02/22. Treatment plan released. Per the study site coordinator, due to the delayed [facility] CMP results, patient will need labs after four doses of increased dose have been taken. Lab appointment made for 06/06/22, with a research visit to follow on 06/09/22. Per the study site coordinator, all other appointments can remain as previously scheduled. Patient verbalized understanding of all instructions, denied any questions or concerns at this time. Treating investigator aware of all delays and plan for patient continuing on study.   PLAN: Patient's family member provided with updated calendar for October appointments. Patient instructed to continue attending radiation  and MD appointments as scheduled and to plan for labs to be collected on Friday, 06/06/22, and to meet with research on Monday, 06/09/22. Patient understands to bring study drug and medication diaries to appointments on 06/09/22 for week 4 (week 3 titration) visit. Patient thanked for her time and continued voluntary participation in this study. Patient has been provided direct contact information and is encouraged to contact this nurse for any questions or concerns.  Vickii Penna, RN, BSN, CPN Clinical Research Nurse I [phone]  06/02/2022 4:05 PM

## 2022-06-02 NOTE — Addendum Note (Signed)
Addended by: Bo Mcclintock on: 06/02/2022 04:11 PM   Modules accepted: Orders

## 2022-06-03 ENCOUNTER — Other Ambulatory Visit (HOSPITAL_COMMUNITY): Payer: Self-pay

## 2022-06-03 ENCOUNTER — Other Ambulatory Visit: Payer: Medicare Other

## 2022-06-03 ENCOUNTER — Other Ambulatory Visit: Payer: Self-pay

## 2022-06-03 ENCOUNTER — Ambulatory Visit
Admission: RE | Admit: 2022-06-03 | Discharge: 2022-06-03 | Disposition: A | Discharge status: Home / Self Care | Payer: Medicare Other | Source: Ambulatory Visit | Attending: Radiation Oncology | Admitting: Radiation Oncology

## 2022-06-03 ENCOUNTER — Encounter: Payer: Medicare Other | Admitting: Occupational Therapy

## 2022-06-03 DIAGNOSIS — Z510 Encounter for antineoplastic radiation therapy: Secondary | ICD-10-CM | POA: Diagnosis not present

## 2022-06-03 LAB — RAD ONC ARIA SESSION SUMMARY
Course Elapsed Days: 22
Plan Fractions Treated to Date: 16
Plan Prescribed Dose Per Fraction: 2 Gy
Plan Total Fractions Prescribed: 23
Plan Total Prescribed Dose: 46 Gy
Reference Point Dosage Given to Date: 32 Gy
Reference Point Session Dosage Given: 2 Gy
Session Number: 16

## 2022-06-04 ENCOUNTER — Other Ambulatory Visit: Payer: Self-pay

## 2022-06-04 ENCOUNTER — Ambulatory Visit
Admission: RE | Admit: 2022-06-04 | Discharge: 2022-06-04 | Disposition: A | Discharge status: Home / Self Care | Payer: Medicare Other | Source: Ambulatory Visit | Attending: Radiation Oncology | Admitting: Radiation Oncology

## 2022-06-04 DIAGNOSIS — Z510 Encounter for antineoplastic radiation therapy: Secondary | ICD-10-CM | POA: Diagnosis not present

## 2022-06-04 LAB — RAD ONC ARIA SESSION SUMMARY
Course Elapsed Days: 23
Plan Fractions Treated to Date: 17
Plan Prescribed Dose Per Fraction: 2 Gy
Plan Total Fractions Prescribed: 23
Plan Total Prescribed Dose: 46 Gy
Reference Point Dosage Given to Date: 34 Gy
Reference Point Session Dosage Given: 2 Gy
Session Number: 17

## 2022-06-05 ENCOUNTER — Inpatient Hospital Stay (HOSPITAL_BASED_OUTPATIENT_CLINIC_OR_DEPARTMENT_OTHER): Payer: Medicare Other | Admitting: Internal Medicine

## 2022-06-05 ENCOUNTER — Inpatient Hospital Stay: Payer: Medicare Other

## 2022-06-05 ENCOUNTER — Other Ambulatory Visit: Payer: Self-pay

## 2022-06-05 ENCOUNTER — Ambulatory Visit
Admission: RE | Admit: 2022-06-05 | Discharge: 2022-06-05 | Disposition: A | Discharge status: Home / Self Care | Payer: Medicare Other | Source: Ambulatory Visit | Attending: Radiation Oncology | Admitting: Radiation Oncology

## 2022-06-05 ENCOUNTER — Other Ambulatory Visit: Payer: Self-pay | Admitting: *Deleted

## 2022-06-05 ENCOUNTER — Other Ambulatory Visit: Payer: Medicare Other

## 2022-06-05 VITALS — BP 113/70 | HR 75 | Temp 97.9°F | Resp 18 | Ht 62.0 in | Wt 154.3 lb

## 2022-06-05 DIAGNOSIS — G4700 Insomnia, unspecified: Secondary | ICD-10-CM | POA: Diagnosis not present

## 2022-06-05 DIAGNOSIS — G43709 Chronic migraine without aura, not intractable, without status migrainosus: Secondary | ICD-10-CM | POA: Diagnosis not present

## 2022-06-05 DIAGNOSIS — Z79899 Other long term (current) drug therapy: Secondary | ICD-10-CM | POA: Diagnosis not present

## 2022-06-05 DIAGNOSIS — C711 Malignant neoplasm of frontal lobe: Secondary | ICD-10-CM

## 2022-06-05 DIAGNOSIS — Z510 Encounter for antineoplastic radiation therapy: Secondary | ICD-10-CM | POA: Diagnosis not present

## 2022-06-05 LAB — RAD ONC ARIA SESSION SUMMARY
Course Elapsed Days: 24
Plan Fractions Treated to Date: 18
Plan Prescribed Dose Per Fraction: 2 Gy
Plan Total Fractions Prescribed: 23
Plan Total Prescribed Dose: 46 Gy
Reference Point Dosage Given to Date: 36 Gy
Reference Point Session Dosage Given: 2 Gy
Session Number: 18

## 2022-06-05 LAB — CBC WITH DIFFERENTIAL (CANCER CENTER ONLY)
Abs Immature Granulocytes: 0.03 10*3/uL (ref 0.00–0.07)
Basophils Absolute: 0 10*3/uL (ref 0.0–0.1)
Basophils Relative: 1 %
Eosinophils Absolute: 0.2 10*3/uL (ref 0.0–0.5)
Eosinophils Relative: 3 %
HCT: 34.3 % — ABNORMAL LOW (ref 36.0–46.0)
Hemoglobin: 11.3 g/dL — ABNORMAL LOW (ref 12.0–15.0)
Immature Granulocytes: 1 %
Lymphocytes Relative: 10 %
Lymphs Abs: 0.7 10*3/uL (ref 0.7–4.0)
MCH: 31.7 pg (ref 26.0–34.0)
MCHC: 32.9 g/dL (ref 30.0–36.0)
MCV: 96.1 fL (ref 80.0–100.0)
Monocytes Absolute: 0.5 10*3/uL (ref 0.1–1.0)
Monocytes Relative: 8 %
Neutro Abs: 5.2 10*3/uL (ref 1.7–7.7)
Neutrophils Relative %: 77 %
Platelet Count: 259 10*3/uL (ref 150–400)
RBC: 3.57 MIL/uL — ABNORMAL LOW (ref 3.87–5.11)
RDW: 13 % (ref 11.5–15.5)
WBC Count: 6.6 10*3/uL (ref 4.0–10.5)
nRBC: 0 % (ref 0.0–0.2)

## 2022-06-05 LAB — CMP (CANCER CENTER ONLY)
ALT: 8 U/L (ref 0–44)
AST: 11 U/L — ABNORMAL LOW (ref 15–41)
Albumin: 3.7 g/dL (ref 3.5–5.0)
Alkaline Phosphatase: 56 U/L (ref 38–126)
Anion gap: 3 — ABNORMAL LOW (ref 5–15)
BUN: 23 mg/dL (ref 8–23)
CO2: 26 mmol/L (ref 22–32)
Calcium: 8.5 mg/dL — ABNORMAL LOW (ref 8.9–10.3)
Chloride: 112 mmol/L — ABNORMAL HIGH (ref 98–111)
Creatinine: 1.08 mg/dL — ABNORMAL HIGH (ref 0.44–1.00)
GFR, Estimated: 57 mL/min — ABNORMAL LOW (ref >60)
Glucose, Bld: 95 mg/dL (ref 70–99)
Potassium: 4.1 mmol/L (ref 3.5–5.1)
Sodium: 141 mmol/L (ref 135–145)
Total Bilirubin: 0.4 mg/dL (ref 0.3–1.2)
Total Protein: 6.1 g/dL — ABNORMAL LOW (ref 6.5–8.1)

## 2022-06-05 NOTE — Progress Notes (Signed)
[facility] [place], [place] [address] [phone]   Interval Evaluation  Date of Service: 06/05/22 [name] Patient MRN: [id number] Patient DOB: Mar 03, 1956 Provider: Ventura Sellers, MD  Identifying Statement:  Jennifer [name] is a 66 y.o. female with left frontal glioblastoma   Oncologic History: Oncology History  Glioblastoma multiforme of frontal lobe ([facility])  04/11/2022 Surgery   Craniotomy, resection of left frontal mass with Dr. Christella Noa; path is glioblastoma IDHwt.   05/12/2022 -  Chemotherapy   Patient is on Treatment Plan : BRAIN GLIOBLASTOMA Radiation Therapy With Concurrent Temozolomide 75 mg/m2 Daily Followed By Sequential Maintenance Temozolomide x 6-12 cycles       Biomarkers:  MGMT Unknown.  IDH 1/2 Wild type.  EGFR Unknown  TERT Unknown   Interval History: Jennifer [name] presents today for follow up, now having completed 4 weeks of radiation and Temodar.  No complications from treatment thus far.  No new or progressive neurologic complaints.  Denies headaches or seizures.    H+P (05/05/22) Patient presented to neurologic attention in mid-August 2023 with new onset difficulty speaking, confusion noticed by family.  She and her family described sudden onset speech impairment and confusion in the afternoon of the 16th, after having been seen in normal state of health that morning.  CNS imaging demonstrated an enhancing left frontal mass, which was resected by Dr. Christella Noa on 04/11/22.  Following surgery she had some speech difficulty, but improved quickly through rehab admission.  At present, she is fully independent with gait, speech, ADLs.  She is off decadron and Keppra.  Retired, had worked at [employer] in [employer].  Today presents with son and daughter in law.    Medications: Current Outpatient Medications on File Prior to Visit  Medication Sig Dispense Refill   atorvastatin (LIPITOR) 10 MG  tablet TAKE 1 TABLET BY MOUTH EVERY DAY (Patient taking differently: Take 10 mg by mouth daily.) 90 tablet 1   busPIRone (BUSPAR) 5 MG tablet TAKE 1 TABLET BY MOUTH THREE TIMES A DAY (Patient taking differently: Take 5 mg by mouth 3 (three) times daily.) 270 tablet 1   fluticasone (FLONASE) 50 MCG/ACT nasal spray SPRAY 2 SPRAYS INTO EACH NOSTRIL EVERY DAY (Patient taking differently: Place 2 sprays into both nostrils daily.) 48 mL 0   Investigational Ramipril 1.25 MG capsule [id number] Take 4 capsules (5 mg total) by mouth at bedtime for 7 days. Take at bedtime with or without food. Drink adequate water to avoid dehydration. Avoid salt substitutes that are high in potassium.  0   ondansetron (ZOFRAN) 8 MG tablet Take 1 tablet (8 mg total) by mouth every 8 (eight) hours as needed for nausea or vomiting. May take 30-60 minutes prior to Temodar administration if nausea/vomiting occurs as needed. 30 tablet 1   oxybutynin (DITROPAN-XL) 5 MG 24 hr tablet TAKE 1 TABLET BY MOUTH EVERYDAY AT BEDTIME 90 tablet 1   temozolomide (TEMODAR) 100 MG capsule Take 1 capsule (100 mg total) by mouth daily. May take on an empty stomach to decrease nausea & vomiting. 42 capsule 0   temozolomide (TEMODAR) 20 MG capsule Take 1 capsule (20 mg total) by mouth daily. May take on an empty stomach to decrease nausea & vomiting. 42 capsule 0   topiramate (TOPAMAX) 50 MG tablet TAKE 1 TABLET BY MOUTH TWICE A DAY (Patient taking differently: Take 50 mg by mouth 2 (two) times daily.) 60 tablet 0  traMADol (ULTRAM) 50 MG tablet Take 50 mg by mouth every 8 (eight) hours as needed.     traZODone (DESYREL) 50 MG tablet Take 1 tablet (50 mg total) by mouth at bedtime as needed for sleep. 30 tablet 2   amLODipine (NORVASC) 5 MG tablet TAKE 1 TABLET BY MOUTH EVERY DAY (Patient not taking: Reported on 05/06/2022) 90 tablet 1   losartan (COZAAR) 50 MG tablet TAKE 1 TABLET BY MOUTH EVERY DAY (Patient not taking: Reported on 05/06/2022) 90 tablet 1    No current facility-administered medications on file prior to visit.    Allergies: No Known Allergies Past Medical History:  Past Medical History:  Diagnosis Date   Hyperlipidemia    Hypertension    Migraine    Past Surgical History:  Past Surgical History:  Procedure Laterality Date   APPENDECTOMY     APPLICATION OF CRANIAL NAVIGATION N/A 04/11/2022   Procedure: APPLICATION OF CRANIAL NAVIGATION;  Surgeon: Ashok Pall, MD;  Location: [facility];  Service: Neurosurgery;  Laterality: N/A;   CRANIOTOMY Left 04/11/2022   Procedure: LEFT FRONTAL CRANIOTOMY FOR RESECTION OF TUMOR WITH Lucky Rathke;  Surgeon: Ashok Pall, MD;  Location: [facility];  Service: Neurosurgery;  Laterality: Left;  RM 21   LAPAROSCOPIC APPENDECTOMY  07/15/2012   Procedure: APPENDECTOMY LAPAROSCOPIC;  Surgeon: Zenovia Jarred, MD;  Location: [facility];  Service: General;  Laterality: N/A;  Laparoscopic Appendectomy   Social History:  Social History   Socioeconomic History   Marital status: Widowed    Spouse name: Not on file   Number of children: 2   Years of education: Not on file   Highest education level: Not on file  Occupational History   Occupation: retail    Comment: [employer] in [place], [place]  Tobacco Use   Smoking status: Never   Smokeless tobacco: Never  Substance and Sexual Activity   Alcohol use: Yes    Comment: 2 drinks once every 2-3 months   Drug use: No   Sexual activity: Not on file  Other Topics Concern   Not on file  Social History Narrative   Marital status: widowed since 2008; husband was bipolar and alcoholic      Children: 2 children (28, 71); son lives with patient.Daughter lives in [place]/autism/does not drive.  Daughter lives with mother-in-law.  One grandpuppy      Lives: with son      Employment: works at [employer]; also works in [place].      Tobacco: none      Alcohol: none; husband was an alcoholic.      Drugs:   none      Exercise:   Social  Determinants of Health   Financial Resource Strain: Not on file  Food Insecurity: Not on file  Transportation Needs: Not on file  Physical Activity: Not on file  Stress: Not on file  Social Connections: Not on file  Intimate Partner Violence: Not on file   Family History:  Family History  Adopted: Yes    Review of Systems: Constitutional: Doesn't report fevers, chills or abnormal weight loss Eyes: Doesn't report blurriness of vision Ears, nose, mouth, throat, and face: Doesn't report sore throat Respiratory: Doesn't report cough, dyspnea or wheezes Cardiovascular: Doesn't report palpitation, chest discomfort  Gastrointestinal:  Doesn't report nausea, constipation, diarrhea GU: Doesn't report incontinence Skin: Doesn't report skin rashes Neurological: Per HPI Musculoskeletal: Doesn't report joint pain Behavioral/Psych: Doesn't report anxiety  Physical Exam: Vitals:   06/05/22 1506  BP:  113/70  Pulse: 75  Resp: 18  Temp: 97.9 F (36.6 C)  SpO2: 99%   KPS: 80 ECOG: 1 General: Alert, cooperative, pleasant, in no acute distress Head: Normal EENT: No conjunctival injection or scleral icterus.  Lungs: Resp effort normal Cardiac: Regular rate Abdomen: Non-distended abdomen Skin: No rashes cyanosis or petechiae. Extremities: No clubbing or edema  Neurologic Exam: Mental Status: Awake, alert, attentive to examiner. Oriented to self and environment. Language is fluent with intact comprehension.  Pseudobulbar elements. Cranial Nerves: Visual acuity is grossly normal. Visual fields are full. Extra-ocular movements intact. No ptosis. Face is symmetric Motor: Tone and bulk are normal. Power is full in both arms and legs. Reflexes are symmetric, no pathologic reflexes present.  Sensory: Intact to light touch Gait: Normal.   Labs: I have reviewed the data as listed    Component Value Date/Time   NA 141 05/20/2022 1512   NA 140 10/04/2018 1703   K 3.7 05/20/2022 1512    CL 109 05/20/2022 1512   CO2 26 05/20/2022 1512   GLUCOSE 118 (H) 05/20/2022 1512   BUN 21 05/20/2022 1512   BUN 24 10/04/2018 1703   CREATININE 1.15 (H) 05/20/2022 1512   CREATININE 1.06 (H) 01/29/2016 1518   CALCIUM 9.1 05/20/2022 1512   PROT 7.0 05/20/2022 1512   PROT 6.8 10/04/2018 1703   ALBUMIN 4.0 05/20/2022 1512   ALBUMIN 4.6 10/04/2018 1703   AST 13 (L) 05/20/2022 1512   ALT 11 05/20/2022 1512   ALKPHOS 63 05/20/2022 1512   BILITOT 0.4 05/20/2022 1512   GFRNONAA 53 (L) 05/20/2022 1512   GFRNONAA 58 (L) 01/29/2016 1518   GFRAA 70 10/04/2018 1703   GFRAA 67 01/29/2016 1518   Lab Results  Component Value Date   WBC 6.6 06/05/2022   NEUTROABS 5.2 06/05/2022   HGB 11.3 (L) 06/05/2022   HCT 34.3 (L) 06/05/2022   MCV 96.1 06/05/2022   PLT 259 06/05/2022      Assessment/Plan Glioblastoma multiforme of frontal lobe (HCC)  Chronic migraine without aura without status migrainosus, not intractable  Jennifer [name] is clinically stable today.  No new or progressive changes.  We ultimately recommended continuing with course of intensity modulated radiation therapy and concurrent daily Temozolomide.  Radiation will be administered Mon-Fri over 6 weeks, Temodar will be dosed at 40m/m2 to be given daily over 42 days.  We reviewed side effects of temodar, including fatigue, nausea/vomiting, constipation, and cytopenias.  Chemotherapy should be held for the following:  ANC less than 1,000  Platelets less than 100,000  LFT or creatinine greater than 2x ULN  If clinical concerns/contraindications develop  Every 2 weeks during radiation, labs will be checked accompanied by a clinical evaluation in the brain tumor clinic.  We recommended she continue Topamax 537mBID for migraine and seizure prevention.  May con't Trazodone 5052mS for insomnia.  She will continue with "Testing Ramipril to Prevent Memory Loss in Patients with Glioblastoma"  study protocol.  All questions  were answered. The patient knows to call the clinic with any problems, questions or concerns. No barriers to learning were detected.  The total time spent in the encounter was 30 minutes and more than 50% was on counseling and review of test results   ZacVentura SellersD Medical Director of Neuro-Oncology [facility] [place]/12/23 3:15 PM

## 2022-06-06 ENCOUNTER — Other Ambulatory Visit: Payer: Medicare Other

## 2022-06-06 ENCOUNTER — Other Ambulatory Visit: Payer: Self-pay

## 2022-06-06 ENCOUNTER — Ambulatory Visit
Admission: RE | Admit: 2022-06-06 | Discharge: 2022-06-06 | Disposition: A | Discharge status: Home / Self Care | Payer: Medicare Other | Source: Ambulatory Visit | Attending: Radiation Oncology | Admitting: Radiation Oncology

## 2022-06-06 ENCOUNTER — Inpatient Hospital Stay: Payer: Medicare Other

## 2022-06-06 DIAGNOSIS — C711 Malignant neoplasm of frontal lobe: Secondary | ICD-10-CM

## 2022-06-06 DIAGNOSIS — Z510 Encounter for antineoplastic radiation therapy: Secondary | ICD-10-CM | POA: Diagnosis not present

## 2022-06-06 LAB — RAD ONC ARIA SESSION SUMMARY
Course Elapsed Days: 25
Plan Fractions Treated to Date: 19
Plan Prescribed Dose Per Fraction: 2 Gy
Plan Total Fractions Prescribed: 23
Plan Total Prescribed Dose: 46 Gy
Reference Point Dosage Given to Date: 38 Gy
Reference Point Session Dosage Given: 2 Gy
Session Number: 19

## 2022-06-06 LAB — RESEARCH LABS

## 2022-06-07 ENCOUNTER — Other Ambulatory Visit: Payer: Self-pay

## 2022-06-09 ENCOUNTER — Encounter: Payer: Self-pay | Admitting: Internal Medicine

## 2022-06-09 ENCOUNTER — Other Ambulatory Visit: Payer: Self-pay

## 2022-06-09 ENCOUNTER — Ambulatory Visit
Admission: RE | Admit: 2022-06-09 | Discharge: 2022-06-09 | Disposition: A | Discharge status: Home / Self Care | Payer: Medicare Other | Source: Ambulatory Visit | Attending: Radiation Oncology | Admitting: Radiation Oncology

## 2022-06-09 ENCOUNTER — Inpatient Hospital Stay: Payer: Medicare Other

## 2022-06-09 DIAGNOSIS — Z510 Encounter for antineoplastic radiation therapy: Secondary | ICD-10-CM | POA: Diagnosis not present

## 2022-06-09 DIAGNOSIS — C711 Malignant neoplasm of frontal lobe: Secondary | ICD-10-CM

## 2022-06-09 LAB — RAD ONC ARIA SESSION SUMMARY
Course Elapsed Days: 28
Plan Fractions Treated to Date: 20
Plan Prescribed Dose Per Fraction: 2 Gy
Plan Total Fractions Prescribed: 23
Plan Total Prescribed Dose: 46 Gy
Reference Point Dosage Given to Date: 40 Gy
Reference Point Session Dosage Given: 2 Gy
Session Number: 20

## 2022-06-09 MED ORDER — INV-RAMIPRIL 1.25 MG CAP WF-1801 (#133): 5.0000 mg | ORAL_CAPSULE | Freq: Every day | ORAL | 0 refills | Status: DC

## 2022-06-09 NOTE — Research (Signed)
[id number] A Single Arm, Pilot Study of Ramipril for Preventing Radiation-Induced Cognitive Decline in Glioblastoma (GBM) Patients Receiving Brain Radiotherapy   Patient presented to clinic accompanied by her daughter-in-law for week 4 (week 3 titration) visit.    LABS: Mandatory labs ([facility] CMP) collected per consent and study protocol on 06/06/22. Patient tolerated well without complaint.   VITAL SIGNS: Blood pressure today was 126/74.    CONCOMITANT MEDICATIONS: Concomitant medications reviewed. Patient verified that current medication list is correct; denied any new medications. Patient has been taking temodar '120mg'$  daily since 05/11/22. Confirmed that she had taken all required doses this week.    ADVERSE EVENTS: Adverse events assessed per protocol. Patient reported no new adverse events. She noted ongoing mild nausea (grade 1) since starting temodar that continues to be well-managed with zofran PRN. Patient also reported ongoing mild alopecia (grade 1). CMP results from 06/06/22 show ongoing hypernatremia (grade 1) as well as mild hyperglycemia (grade 1). Per Dr Mickeal Skinner, none of these adverse events are related to treatment with the study drug; no action or reporting is required at this time. See table below.   ADVERSE EVENT LOG: Study/Protocol: [id number]  Week 4 (Week 3 Titration)   Baseline: hypertension (grade 1); insomnia (grade 2).   Event Grade CTCAE v5.0 Onset Date Resolved Date Attribution to Study Treatment Treatment Comments  Nausea 1 05/11/22 Ongoing Unrelated Zofran PRN    Alopecia 1 05/25/22 Ongoing Unrelated None  .  Hypernatremia 1 05/27/22 Ongoing Unrelated None   Hyperglycemia 1 06/06/22 Ongoing Unrelated None     STUDY DRUG: Patient returned study drug bottle (lot #[id number]) with 76 capsules remaining (133 total capsules dispensed - 76 remaining = 57 total capsules taken); count confirmed with Raul Del, PharmD. Medication diary reflects the same: 8 capsules taken (4 doses  of 2 pills/day) from 10/5 - 10/8-23 due to delayed [facility] results + 28 capsules taken (7 doses of 4 pills/day) from 10/9 - 06/08/22 for a compliance rate of 100% for week 3. Bottle (lot [id number]) returned to patient. Returned original medication diary for October to patient. Per protocol, patient is eligible to remain on dose of 4 pills daily. Patient verbalized understanding of all instructions, denied any questions or concerns at this time.    PLAN: Upon completion of all study requirements, patient was escorted to the lobby for daily radiation. Patient instructed to continue attending radiation appointments as scheduled and to plan for labs to be collected tomorrow, 06/10/22, and meet to with research on Thursday, 06/12/22. Patient understands to bring study drug and medication diaries to appointments on 06/12/22 for week 5 (week 4 titration) visit. Patient thanked for her time and continued voluntary participation in this study. Patient has been provided direct contact information and is encouraged to contact this nurse for any questions or concerns.  Vickii Penna, RN, BSN, CPN Clinical Research Nurse I [phone]  06/09/2022 3:46 PM

## 2022-06-10 ENCOUNTER — Inpatient Hospital Stay: Payer: Medicare Other

## 2022-06-10 ENCOUNTER — Other Ambulatory Visit: Payer: Self-pay

## 2022-06-10 ENCOUNTER — Ambulatory Visit
Admission: RE | Admit: 2022-06-10 | Discharge: 2022-06-10 | Disposition: A | Discharge status: Home / Self Care | Payer: Medicare Other | Source: Ambulatory Visit | Attending: Radiation Oncology | Admitting: Radiation Oncology

## 2022-06-10 DIAGNOSIS — C711 Malignant neoplasm of frontal lobe: Secondary | ICD-10-CM

## 2022-06-10 DIAGNOSIS — Z510 Encounter for antineoplastic radiation therapy: Secondary | ICD-10-CM | POA: Diagnosis not present

## 2022-06-10 LAB — RAD ONC ARIA SESSION SUMMARY
Course Elapsed Days: 29
Plan Fractions Treated to Date: 21
Plan Prescribed Dose Per Fraction: 2 Gy
Plan Total Fractions Prescribed: 23
Plan Total Prescribed Dose: 46 Gy
Reference Point Dosage Given to Date: 42 Gy
Reference Point Session Dosage Given: 2 Gy
Session Number: 21

## 2022-06-10 LAB — RESEARCH LABS

## 2022-06-11 ENCOUNTER — Other Ambulatory Visit: Payer: Self-pay

## 2022-06-11 ENCOUNTER — Ambulatory Visit
Admission: RE | Admit: 2022-06-11 | Discharge: 2022-06-11 | Disposition: A | Discharge status: Home / Self Care | Payer: Medicare Other | Source: Ambulatory Visit | Attending: Radiation Oncology | Admitting: Radiation Oncology

## 2022-06-11 DIAGNOSIS — Z510 Encounter for antineoplastic radiation therapy: Secondary | ICD-10-CM | POA: Diagnosis not present

## 2022-06-11 LAB — RAD ONC ARIA SESSION SUMMARY
Course Elapsed Days: 30
Plan Fractions Treated to Date: 22
Plan Prescribed Dose Per Fraction: 2 Gy
Plan Total Fractions Prescribed: 23
Plan Total Prescribed Dose: 46 Gy
Reference Point Dosage Given to Date: 44 Gy
Reference Point Session Dosage Given: 2 Gy
Session Number: 22

## 2022-06-12 ENCOUNTER — Inpatient Hospital Stay: Payer: Medicare Other

## 2022-06-12 ENCOUNTER — Ambulatory Visit
Admission: RE | Admit: 2022-06-12 | Discharge: 2022-06-12 | Disposition: A | Discharge status: Home / Self Care | Payer: Medicare Other | Source: Ambulatory Visit | Attending: Radiation Oncology | Admitting: Radiation Oncology

## 2022-06-12 ENCOUNTER — Encounter: Payer: Self-pay | Admitting: Medical Oncology

## 2022-06-12 ENCOUNTER — Other Ambulatory Visit: Payer: Self-pay

## 2022-06-12 ENCOUNTER — Ambulatory Visit: Payer: Medicare Other

## 2022-06-12 DIAGNOSIS — Z510 Encounter for antineoplastic radiation therapy: Secondary | ICD-10-CM | POA: Diagnosis not present

## 2022-06-12 DIAGNOSIS — C711 Malignant neoplasm of frontal lobe: Secondary | ICD-10-CM

## 2022-06-12 LAB — RAD ONC ARIA SESSION SUMMARY
Course Elapsed Days: 31
Plan Fractions Treated to Date: 23
Plan Prescribed Dose Per Fraction: 2 Gy
Plan Total Fractions Prescribed: 23
Plan Total Prescribed Dose: 46 Gy
Reference Point Dosage Given to Date: 46 Gy
Reference Point Session Dosage Given: 2 Gy
Session Number: 23

## 2022-06-12 MED ORDER — INV-RAMIPRIL 1.25 MG CAP WF-1801 (#133): 5.0000 mg | ORAL_CAPSULE | Freq: Every day | ORAL | 0 refills | Status: DC

## 2022-06-12 NOTE — Progress Notes (Addendum)
[id number] A Single Arm, Pilot Study of Ramipril for Preventing Radiation-Induced Cognitive Decline in Glioblastoma (GBM) Patients Receiving Brain Radiotherapy   Patient presented to clinic accompanied by her daughter-in-law for week 5 (week 4 titration) visit.    LABS: Mandatory labs ([facility] CMP) collected per consent and study protocol on 06/10/22. Patient tolerated well without complaint.   VITAL SIGNS: Blood pressure today was 124/64.    CONCOMITANT MEDICATIONS: Concomitant medications reviewed. Patient verified that current medication list is correct; denied any new medications. Patient has been taking temodar '120mg'$  daily since 05/11/22. Confirmed that she had taken all required doses this week.    ADVERSE EVENTS: Adverse events assessed per protocol. Patient reported no new adverse events. CMP results from 06/10/22 lab draw showed resolution of hypernatremia and hyperglycemia.  See table below. Dr. Mickeal Skinner reviewed patient's lab results and consents for patient to continue with current dosage. Patient reports that she feels her "memory recall has significantly improved in the last few weeks."   ADVERSE EVENT LOG: Study/Protocol: [id number]  Week 5 (Week 4 Titration)   Baseline: hypertension (grade 1); insomnia (grade 2).   Event Grade CTCAE v5.0 Onset Date Resolved Date Attribution to Study Treatment Treatment Comments  Nausea 1 05/11/22 Ongoing Unrelated Zofran PRN    Alopecia 1 05/25/22 Ongoing Unrelated None  .  Hypernatremia 1 05/27/22 06/12/22 Unrelated None    Hyperglycemia 1 06/06/22 06/12/22 Unrelated None      STUDY DRUG: Patient returned study drug bottle (lot #[id number]) with 64 capsules remaining (133 total capsules dispensed - 64 remaining = 69 total capsules taken); count confirmed with Raul Del, PharmD. Medication diary reflects the same: 12 capsules taken (3 doses of 4 pills/day) from 10/16 - 06/11/22 for a compliance rate of 100% for week 4. Bottle (lot [id number]) returned to  patient. Returned original medication diary for October to patient. Per protocol, patient is eligible to remain on dose of 4 pills daily. Patient verbalized understanding of all instructions, denied any questions or concerns at this time.    PLAN: Upon completion of all study requirements, patient was escorted to the lobby for daily radiation. Patient instructed to continue attending radiation appointments as scheduled and to plan for labs to be collected Tuesday, 06/17/22, and meet to with research on Thursday, 06/19/22. Patient understands to bring study drug and medication diaries to appointments on 06/19/22 for week 6 (week 5 titration) visit. Patient thanked for her time and continued voluntary participation in this study. Patient has been provided direct contact information and is encouraged to contact this nurse for any questions or concerns.  Maxwell Marion, RN, BSN, [facility] Clinical Research Nurse Lead 06/12/2022 3:09 PM

## 2022-06-13 ENCOUNTER — Ambulatory Visit: Payer: Medicare Other

## 2022-06-13 ENCOUNTER — Other Ambulatory Visit: Payer: Self-pay

## 2022-06-13 ENCOUNTER — Ambulatory Visit
Admission: RE | Admit: 2022-06-13 | Discharge: 2022-06-13 | Disposition: A | Discharge status: Home / Self Care | Payer: Medicare Other | Source: Ambulatory Visit | Attending: Radiation Oncology | Admitting: Radiation Oncology

## 2022-06-13 ENCOUNTER — Encounter: Payer: Self-pay | Admitting: Internal Medicine

## 2022-06-13 DIAGNOSIS — Z510 Encounter for antineoplastic radiation therapy: Secondary | ICD-10-CM | POA: Diagnosis not present

## 2022-06-13 LAB — RAD ONC ARIA SESSION SUMMARY
Course Elapsed Days: 32
Plan Fractions Treated to Date: 1
Plan Prescribed Dose Per Fraction: 2 Gy
Plan Total Fractions Prescribed: 7
Plan Total Prescribed Dose: 14 Gy
Reference Point Dosage Given to Date: 2 Gy
Reference Point Session Dosage Given: 2 Gy
Session Number: 24

## 2022-06-16 ENCOUNTER — Other Ambulatory Visit: Payer: Self-pay

## 2022-06-16 ENCOUNTER — Encounter: Payer: Self-pay | Admitting: Internal Medicine

## 2022-06-16 ENCOUNTER — Ambulatory Visit
Admission: RE | Admit: 2022-06-16 | Discharge: 2022-06-16 | Disposition: A | Discharge status: Home / Self Care | Payer: Medicare Other | Source: Ambulatory Visit | Attending: Radiation Oncology | Admitting: Radiation Oncology

## 2022-06-16 DIAGNOSIS — Z510 Encounter for antineoplastic radiation therapy: Secondary | ICD-10-CM | POA: Diagnosis not present

## 2022-06-16 LAB — RAD ONC ARIA SESSION SUMMARY
Course Elapsed Days: 35
Plan Fractions Treated to Date: 2
Plan Prescribed Dose Per Fraction: 2 Gy
Plan Total Fractions Prescribed: 7
Plan Total Prescribed Dose: 14 Gy
Reference Point Dosage Given to Date: 4 Gy
Reference Point Session Dosage Given: 2 Gy
Session Number: 25

## 2022-06-16 NOTE — Research (Signed)
See research encounter note from 06/12/22 by Adele Dan, RN.

## 2022-06-17 ENCOUNTER — Other Ambulatory Visit: Payer: Self-pay

## 2022-06-17 ENCOUNTER — Inpatient Hospital Stay: Payer: Medicare Other

## 2022-06-17 ENCOUNTER — Ambulatory Visit
Admission: RE | Admit: 2022-06-17 | Discharge: 2022-06-17 | Disposition: A | Discharge status: Home / Self Care | Payer: Medicare Other | Source: Ambulatory Visit | Attending: Radiation Oncology | Admitting: Radiation Oncology

## 2022-06-17 DIAGNOSIS — C711 Malignant neoplasm of frontal lobe: Secondary | ICD-10-CM

## 2022-06-17 DIAGNOSIS — Z510 Encounter for antineoplastic radiation therapy: Secondary | ICD-10-CM | POA: Diagnosis not present

## 2022-06-17 LAB — CBC WITH DIFFERENTIAL (CANCER CENTER ONLY)
Abs Immature Granulocytes: 0.03 10*3/uL (ref 0.00–0.07)
Basophils Absolute: 0 10*3/uL (ref 0.0–0.1)
Basophils Relative: 0 %
Eosinophils Absolute: 0.2 10*3/uL (ref 0.0–0.5)
Eosinophils Relative: 3 %
HCT: 38.6 % (ref 36.0–46.0)
Hemoglobin: 12.5 g/dL (ref 12.0–15.0)
Immature Granulocytes: 0 %
Lymphocytes Relative: 7 %
Lymphs Abs: 0.4 10*3/uL — ABNORMAL LOW (ref 0.7–4.0)
MCH: 31 pg (ref 26.0–34.0)
MCHC: 32.4 g/dL (ref 30.0–36.0)
MCV: 95.8 fL (ref 80.0–100.0)
Monocytes Absolute: 0.5 10*3/uL (ref 0.1–1.0)
Monocytes Relative: 7 %
Neutro Abs: 5.7 10*3/uL (ref 1.7–7.7)
Neutrophils Relative %: 83 %
Platelet Count: 187 10*3/uL (ref 150–400)
RBC: 4.03 MIL/uL (ref 3.87–5.11)
RDW: 13.4 % (ref 11.5–15.5)
WBC Count: 6.8 10*3/uL (ref 4.0–10.5)
nRBC: 0 % (ref 0.0–0.2)

## 2022-06-17 LAB — CMP (CANCER CENTER ONLY)
ALT: 10 U/L (ref 0–44)
AST: 13 U/L — ABNORMAL LOW (ref 15–41)
Albumin: 4 g/dL (ref 3.5–5.0)
Alkaline Phosphatase: 57 U/L (ref 38–126)
Anion gap: 6 (ref 5–15)
BUN: 22 mg/dL (ref 8–23)
CO2: 27 mmol/L (ref 22–32)
Calcium: 9.1 mg/dL (ref 8.9–10.3)
Chloride: 110 mmol/L (ref 98–111)
Creatinine: 1.21 mg/dL — ABNORMAL HIGH (ref 0.44–1.00)
GFR, Estimated: 50 mL/min — ABNORMAL LOW (ref >60)
Glucose, Bld: 84 mg/dL (ref 70–99)
Potassium: 4.1 mmol/L (ref 3.5–5.1)
Sodium: 143 mmol/L (ref 135–145)
Total Bilirubin: 0.3 mg/dL (ref 0.3–1.2)
Total Protein: 6.4 g/dL — ABNORMAL LOW (ref 6.5–8.1)

## 2022-06-17 LAB — RAD ONC ARIA SESSION SUMMARY
Course Elapsed Days: 36
Plan Fractions Treated to Date: 3
Plan Prescribed Dose Per Fraction: 2 Gy
Plan Total Fractions Prescribed: 7
Plan Total Prescribed Dose: 14 Gy
Reference Point Dosage Given to Date: 6 Gy
Reference Point Session Dosage Given: 2 Gy
Session Number: 26

## 2022-06-17 LAB — RESEARCH LABS

## 2022-06-18 ENCOUNTER — Other Ambulatory Visit: Payer: Self-pay

## 2022-06-18 ENCOUNTER — Ambulatory Visit
Admission: RE | Admit: 2022-06-18 | Discharge: 2022-06-18 | Disposition: A | Discharge status: Home / Self Care | Payer: Medicare Other | Source: Ambulatory Visit | Attending: Radiation Oncology | Admitting: Radiation Oncology

## 2022-06-18 DIAGNOSIS — Z510 Encounter for antineoplastic radiation therapy: Secondary | ICD-10-CM | POA: Diagnosis not present

## 2022-06-18 LAB — RAD ONC ARIA SESSION SUMMARY
Course Elapsed Days: 37
Plan Fractions Treated to Date: 4
Plan Prescribed Dose Per Fraction: 2 Gy
Plan Total Fractions Prescribed: 7
Plan Total Prescribed Dose: 14 Gy
Reference Point Dosage Given to Date: 8 Gy
Reference Point Session Dosage Given: 2 Gy
Session Number: 27

## 2022-06-19 ENCOUNTER — Other Ambulatory Visit: Payer: Medicare Other

## 2022-06-19 ENCOUNTER — Other Ambulatory Visit: Payer: Self-pay

## 2022-06-19 ENCOUNTER — Inpatient Hospital Stay (HOSPITAL_BASED_OUTPATIENT_CLINIC_OR_DEPARTMENT_OTHER): Payer: Medicare Other | Admitting: Internal Medicine

## 2022-06-19 ENCOUNTER — Encounter: Payer: Self-pay | Admitting: Internal Medicine

## 2022-06-19 ENCOUNTER — Other Ambulatory Visit (HOSPITAL_COMMUNITY): Payer: Self-pay

## 2022-06-19 ENCOUNTER — Inpatient Hospital Stay: Payer: Medicare Other

## 2022-06-19 ENCOUNTER — Ambulatory Visit
Admission: RE | Admit: 2022-06-19 | Discharge: 2022-06-19 | Disposition: A | Discharge status: Home / Self Care | Payer: Medicare Other | Source: Ambulatory Visit | Attending: Radiation Oncology | Admitting: Radiation Oncology

## 2022-06-19 VITALS — BP 130/77 | HR 70 | Temp 97.6°F | Resp 17 | Ht 62.0 in | Wt 154.7 lb

## 2022-06-19 DIAGNOSIS — Z510 Encounter for antineoplastic radiation therapy: Secondary | ICD-10-CM | POA: Diagnosis not present

## 2022-06-19 DIAGNOSIS — C711 Malignant neoplasm of frontal lobe: Secondary | ICD-10-CM

## 2022-06-19 LAB — RAD ONC ARIA SESSION SUMMARY
Course Elapsed Days: 38
Plan Fractions Treated to Date: 5
Plan Prescribed Dose Per Fraction: 2 Gy
Plan Total Fractions Prescribed: 7
Plan Total Prescribed Dose: 14 Gy
Reference Point Dosage Given to Date: 10 Gy
Reference Point Session Dosage Given: 2 Gy
Session Number: 28

## 2022-06-19 MED ORDER — ONDANSETRON HCL 8 MG PO TABS
8.0000 mg | ORAL_TABLET | Freq: Three times a day (TID) | ORAL | 1 refills | Status: DC | PRN
  Filled 2022-06-19: qty 30, 10d supply, fill #0

## 2022-06-19 MED ORDER — INV-RAMIPRIL 1.25 MG CAP WF-1801 (#133): 5.0000 mg | ORAL_CAPSULE | Freq: Every day | ORAL | 0 refills | Status: DC

## 2022-06-19 NOTE — Research (Signed)
[id number] A Single Arm, Pilot Study of Ramipril for Preventing Radiation-Induced Cognitive Decline in Glioblastoma (GBM) Patients Receiving Brain Radiotherapy   Patient presented to clinic accompanied by her friend for week 6 (week 5 titration) visit.    LABS: Mandatory labs ([facility] CMP) collected per consent and study protocol on 06/17/22. Patient tolerated well without complaint.   VITAL SIGNS: Blood pressure today was 120/74.    CONCOMITANT MEDICATIONS: Concomitant medications reviewed. Patient verified that current medication list is correct; denied any new medications. Patient has been taking temodar '120mg'$  daily since 05/11/22. Confirmed that she had taken all required doses this week.    ADVERSE EVENTS: Adverse events assessed per protocol. Patient noted ongoing mild nausea (grade 1) since starting temodar that continues to be well-managed with zofran PRN. Patient also reported ongoing mild alopecia (grade 1). Patient reported a return of mild, intermittent headaches (grade 1) which she has dealt with for years, including prior to study participation. Patient noted an increase in fatigue (grade 1); she reported that she does feel better after resting. Per Dr Mickeal Skinner, none of these adverse events are related to treatment with the study drug; no action or reporting is required at this time. See table below.   ADVERSE EVENT LOG: Study/Protocol: [id number]  Week 6 (Week 5 Titration)   Baseline: hypertension (grade 1); insomnia (grade 2).   Event Grade CTCAE v5.0 Onset Date Resolved Date Attribution to Study Treatment Treatment Comments  Nausea 1 05/11/22 Ongoing Unrelated Zofran PRN    Alopecia 1 05/25/22 Ongoing Unrelated None    Headache 1 06/13/22 Ongoing Unrelated  Intermittent; present prior to study participation  Fatigue 1 06/13/22 Ongoing Unrelated  Relieved by rest    STUDY DRUG: Patient returned study drug bottle (lot #[id number]) with 36 capsules remaining (133 total capsules dispensed - 36  remaining = 97 total capsules taken since start of study); count confirmed with Carolynne Edouard, PharmD. Medication diary reflects the same: 28 capsules taken (7 doses of 4 pills/day) from 10/19 - 06/18/22 for a compliance rate of 100% for week 5. Bottle (lot [id number]) returned to patient. Returned original medication diary for October to patient, as well as new original diary for November. Per protocol, patient is eligible to remain on dose of 4 pills daily. Patient verbalized understanding of all instructions, denied any questions or concerns at this time.    PLAN: Upon completion of all study requirements, patient and friend were escorted to the lobby for scheduled visit with Dr Mickeal Skinner, followed by daily radiation. Patient instructed to continue attending radiation appointments as scheduled and to plan for labs to be collected Monday, 06/23/22, and meet to with research on Thursday, 06/26/22. Patient understands to bring study drug and medication diaries to appointments on 06/26/22 for end of week 6 visit. Patient thanked for her time and continued voluntary participation in this study. Patient has been provided direct contact information and is encouraged to contact this nurse for any questions or concerns.  Vickii Penna, RN, BSN, CPN Clinical Research Nurse I [phone]  06/19/2022 2:42 PM

## 2022-06-19 NOTE — Progress Notes (Signed)
[facility] [place], Jennifer [name] [id number] [phone]   Interval Evaluation  Date of Service: 06/19/22 [name] Patient MRN: [id number] Patient DOB: 11-13-1955 Provider: Ventura Sellers, MD  Identifying Statement:  JAELLE CAMPANILE is a 66 y.o. female with left frontal glioblastoma   Oncologic History: Oncology History  Glioblastoma multiforme of frontal lobe ([place])  04/11/2022 Surgery   Craniotomy, resection of left frontal mass with Dr. Christella Noa; path is glioblastoma IDHwt.   05/12/2022 -  Chemotherapy   Patient is on Treatment Plan : BRAIN GLIOBLASTOMA Radiation Therapy With Concurrent Temozolomide 75 mg/m2 Daily Followed By Sequential Maintenance Temozolomide x 6-12 cycles       Biomarkers:  MGMT Unknown.  IDH 1/2 Wild type.  EGFR Unknown  TERT Unknown   Interval History: Jennifer FRANKLYN presents today for follow up, now in final week of radiation and Temodar.  Continues to tolerate treatments well.  No new or progressive neurologic complaints, having occasional headaches.  Denies severe headaches or seizures.    H+P (05/05/22) Patient presented to neurologic attention in mid-August 2023 with new onset difficulty speaking, confusion noticed by family.  She and her family described sudden onset speech impairment and confusion in the afternoon of the 16th, after having been seen in normal state of health that morning.  CNS imaging demonstrated an enhancing left frontal mass, which was resected by Dr. Christella Noa on 04/11/22.  Following surgery she had some speech difficulty, but improved quickly through rehab admission.  At present, she is fully independent with gait, speech, ADLs.  She is off decadron and Keppra.  Retired, had worked at [employer] in [employer].  Today presents with son and daughter in law.    Medications: Current Outpatient Medications on File Prior to Visit  Medication Sig Dispense Refill    amLODipine (NORVASC) 5 MG tablet TAKE 1 TABLET BY MOUTH EVERY DAY (Patient not taking: Reported on 05/06/2022) 90 tablet 1   atorvastatin (LIPITOR) 10 MG tablet TAKE 1 TABLET BY MOUTH EVERY DAY (Patient taking differently: Take 10 mg by mouth daily.) 90 tablet 1   busPIRone (BUSPAR) 5 MG tablet TAKE 1 TABLET BY MOUTH THREE TIMES A DAY (Patient taking differently: Take 5 mg by mouth 3 (three) times daily.) 270 tablet 1   fluticasone (FLONASE) 50 MCG/ACT nasal spray SPRAY 2 SPRAYS INTO EACH NOSTRIL EVERY DAY (Patient taking differently: Place 2 sprays into both nostrils daily.) 48 mL 0   Investigational Ramipril 1.25 MG capsule [id number] Take 4 capsules (5 mg total) by mouth at bedtime. Take at bedtime with or without food. Drink adequate water to avoid dehydration. Avoid salt substitutes that are high in potassium.  0   losartan (COZAAR) 50 MG tablet TAKE 1 TABLET BY MOUTH EVERY DAY (Patient not taking: Reported on 05/06/2022) 90 tablet 1   ondansetron (ZOFRAN) 8 MG tablet Take 1 tablet (8 mg total) by mouth every 8 (eight) hours as needed for nausea or vomiting. May take 30-60 minutes prior to Temodar administration if nausea/vomiting occurs as needed. 30 tablet 1   oxybutynin (DITROPAN-XL) 5 MG 24 hr tablet TAKE 1 TABLET BY MOUTH EVERYDAY AT BEDTIME 90 tablet 1   temozolomide (TEMODAR) 100 MG capsule Take 1 capsule (100 mg total) by mouth daily. May take on an empty stomach to decrease nausea & vomiting. 42 capsule 0   temozolomide (TEMODAR) 20 MG capsule Take 1 capsule (20 mg total) by mouth daily.  May take on an empty stomach to decrease nausea & vomiting. 42 capsule 0   topiramate (TOPAMAX) 50 MG tablet TAKE 1 TABLET BY MOUTH TWICE A DAY (Patient taking differently: Take 50 mg by mouth 2 (two) times daily.) 60 tablet 0   traMADol (ULTRAM) 50 MG tablet Take 50 mg by mouth every 8 (eight) hours as needed.     traZODone (DESYREL) 50 MG tablet Take 1 tablet (50 mg total) by mouth at bedtime as needed for  sleep. 30 tablet 2   No current facility-administered medications on file prior to visit.    Allergies: No Known Allergies Past Medical History:  Past Medical History:  Diagnosis Date   Hyperlipidemia    Hypertension    Migraine    Past Surgical History:  Past Surgical History:  Procedure Laterality Date   APPENDECTOMY     APPLICATION OF CRANIAL NAVIGATION N/A 04/11/2022   Procedure: APPLICATION OF CRANIAL NAVIGATION;  Surgeon: Ashok Pall, MD;  Location: [facility];  Service: Neurosurgery;  Laterality: N/A;   CRANIOTOMY Left 04/11/2022   Procedure: LEFT FRONTAL CRANIOTOMY FOR RESECTION OF TUMOR WITH Lucky Rathke;  Surgeon: Ashok Pall, MD;  Location: [facility];  Service: Neurosurgery;  Laterality: Left;  RM 21   LAPAROSCOPIC APPENDECTOMY  07/15/2012   Procedure: APPENDECTOMY LAPAROSCOPIC;  Surgeon: Zenovia Jarred, MD;  Location: [facility];  Service: General;  Laterality: N/A;  Laparoscopic Appendectomy   Social History:  Social History   Socioeconomic History   Marital status: Widowed    Spouse name: Not on file   Number of children: 2   Years of education: Not on file   Highest education level: Not on file  Occupational History   Occupation: retail    Comment: [employer] in [place], [place]  Tobacco Use   Smoking status: Never   Smokeless tobacco: Never  Substance and Sexual Activity   Alcohol use: Yes    Comment: 2 drinks once every 2-3 months   Drug use: No   Sexual activity: Not on file  Other Topics Concern   Not on file  Social History Narrative   Marital status: widowed since 2008; husband was bipolar and alcoholic      Children: 2 children (28, 79); son lives with patient.Daughter lives in [place]/autism/does not drive.  Daughter lives with mother-in-law.  One grandpuppy      Lives: with son      Employment: works at [employer]; also works in [place].      Tobacco: none      Alcohol: none; husband was an alcoholic.      Drugs:   none      Exercise:    Social Determinants of Health   Financial Resource Strain: Not on file  Food Insecurity: Not on file  Transportation Needs: Not on file  Physical Activity: Not on file  Stress: Not on file  Social Connections: Not on file  Intimate Partner Violence: Not on file   Family History:  Family History  Adopted: Yes    Review of Systems: Constitutional: Doesn't report fevers, chills or abnormal weight loss Eyes: Doesn't report blurriness of vision Ears, nose, mouth, throat, and face: Doesn't report sore throat Respiratory: Doesn't report cough, dyspnea or wheezes Cardiovascular: Doesn't report palpitation, chest discomfort  Gastrointestinal:  Doesn't report nausea, constipation, diarrhea GU: Doesn't report incontinence Skin: Doesn't report skin rashes Neurological: Per HPI Musculoskeletal: Doesn't report joint pain Behavioral/Psych: Doesn't report anxiety  Physical Exam: Vitals:   06/19/22 1443  BP:  130/77  Pulse: 70  Resp: 17  Temp: 97.6 F (36.4 C)  SpO2: 98%    KPS: 80 ECOG: 1 General: Alert, cooperative, pleasant, in no acute distress Head: Normal EENT: No conjunctival injection or scleral icterus.  Lungs: Resp effort normal Cardiac: Regular rate Abdomen: Non-distended abdomen Skin: No rashes cyanosis or petechiae. Extremities: No clubbing or edema  Neurologic Exam: Mental Status: Awake, alert, attentive to examiner. Oriented to self and environment. Language is fluent with intact comprehension.  Pseudobulbar elements. Cranial Nerves: Visual acuity is grossly normal. Visual fields are full. Extra-ocular movements intact. No ptosis. Face is symmetric Motor: Tone and bulk are normal. Power is full in both arms and legs. Reflexes are symmetric, no pathologic reflexes present.  Sensory: Intact to light touch Gait: Normal.   Labs: I have reviewed the data as listed    Component Value Date/Time   NA 143 06/17/2022 1548   NA 140 10/04/2018 1703   K 4.1  06/17/2022 1548   CL 110 06/17/2022 1548   CO2 27 06/17/2022 1548   GLUCOSE 84 06/17/2022 1548   BUN 22 06/17/2022 1548   BUN 24 10/04/2018 1703   CREATININE 1.21 (H) 06/17/2022 1548   CREATININE 1.06 (H) 01/29/2016 1518   CALCIUM 9.1 06/17/2022 1548   PROT 6.4 (L) 06/17/2022 1548   PROT 6.8 10/04/2018 1703   ALBUMIN 4.0 06/17/2022 1548   ALBUMIN 4.6 10/04/2018 1703   AST 13 (L) 06/17/2022 1548   ALT 10 06/17/2022 1548   ALKPHOS 57 06/17/2022 1548   BILITOT 0.3 06/17/2022 1548   GFRNONAA 50 (L) 06/17/2022 1548   GFRNONAA 58 (L) 01/29/2016 1518   GFRAA 70 10/04/2018 1703   GFRAA 67 01/29/2016 1518   Lab Results  Component Value Date   WBC 6.8 06/17/2022   NEUTROABS 5.7 06/17/2022   HGB 12.5 06/17/2022   HCT 38.6 06/17/2022   MCV 95.8 06/17/2022   PLT 187 06/17/2022      Assessment/Plan Glioblastoma multiforme of frontal lobe (HCC)  BRITTAY MOGLE is clinically stable today, now having completed nearly 6 weeks of IMRT and Temodar.  No new or progressive changes.  We ultimately recommended completing course of intensity modulated radiation therapy and concurrent daily Temozolomide.  Radiation will be administered Mon-Fri over 6 weeks, Temodar will be dosed at 85m/m2 to be given daily over 42 days.  We reviewed side effects of temodar, including fatigue, nausea/vomiting, constipation, and cytopenias.  Chemotherapy should be held for the following:  ANC less than 1,000  Platelets less than 100,000  LFT or creatinine greater than 2x ULN  If clinical concerns/contraindications develop  We recommended she continue Topamax 553mBID for migraine and seizure prevention.  May con't Trazodone 5085mS for insomnia.  She will continue with "Testing Ramipril to Prevent Memory Loss in Patients with Glioblastoma"  study protocol.  We ask that KatREXINE GOWENSturn to clinic in 1 months following post-RT brain MRI, or sooner as needed.  All questions were answered. The  patient knows to call the clinic with any problems, questions or concerns. No barriers to learning were detected.  The total time spent in the encounter was 30 minutes and more than 50% was on counseling and review of test results   ZacVentura SellersD Medical Director of Neuro-Oncology [facility] [place]/26/23 2:42 PM

## 2022-06-20 ENCOUNTER — Other Ambulatory Visit: Payer: Self-pay

## 2022-06-20 ENCOUNTER — Telehealth: Payer: Self-pay | Admitting: Internal Medicine

## 2022-06-20 ENCOUNTER — Ambulatory Visit
Admission: RE | Admit: 2022-06-20 | Discharge: 2022-06-20 | Disposition: A | Discharge status: Home / Self Care | Payer: Medicare Other | Source: Ambulatory Visit | Attending: Radiation Oncology | Admitting: Radiation Oncology

## 2022-06-20 ENCOUNTER — Ambulatory Visit: Payer: Medicare Other

## 2022-06-20 DIAGNOSIS — Z510 Encounter for antineoplastic radiation therapy: Secondary | ICD-10-CM | POA: Diagnosis not present

## 2022-06-20 LAB — RAD ONC ARIA SESSION SUMMARY
Course Elapsed Days: 39
Plan Fractions Treated to Date: 6
Plan Prescribed Dose Per Fraction: 2 Gy
Plan Total Fractions Prescribed: 7
Plan Total Prescribed Dose: 14 Gy
Reference Point Dosage Given to Date: 12 Gy
Reference Point Session Dosage Given: 2 Gy
Session Number: 29

## 2022-06-20 NOTE — Telephone Encounter (Signed)
Per 10/26 los called and spoke to pt about appointment  

## 2022-06-21 ENCOUNTER — Other Ambulatory Visit: Payer: Self-pay

## 2022-06-23 ENCOUNTER — Ambulatory Visit
Admission: RE | Admit: 2022-06-23 | Discharge: 2022-06-23 | Disposition: A | Discharge status: Home / Self Care | Payer: Medicare Other | Source: Ambulatory Visit | Attending: Radiation Oncology | Admitting: Radiation Oncology

## 2022-06-23 ENCOUNTER — Other Ambulatory Visit: Payer: Self-pay

## 2022-06-23 ENCOUNTER — Encounter: Payer: Self-pay | Admitting: Radiation Oncology

## 2022-06-23 ENCOUNTER — Other Ambulatory Visit: Payer: Self-pay | Admitting: Radiation Therapy

## 2022-06-23 ENCOUNTER — Inpatient Hospital Stay: Payer: Medicare Other

## 2022-06-23 DIAGNOSIS — C711 Malignant neoplasm of frontal lobe: Secondary | ICD-10-CM

## 2022-06-23 DIAGNOSIS — Z510 Encounter for antineoplastic radiation therapy: Secondary | ICD-10-CM | POA: Diagnosis not present

## 2022-06-23 LAB — RAD ONC ARIA SESSION SUMMARY
Course Elapsed Days: 42
Plan Fractions Treated to Date: 7
Plan Prescribed Dose Per Fraction: 2 Gy
Plan Total Fractions Prescribed: 7
Plan Total Prescribed Dose: 14 Gy
Reference Point Dosage Given to Date: 14 Gy
Reference Point Session Dosage Given: 2 Gy
Session Number: 30

## 2022-06-23 LAB — RESEARCH LABS

## 2022-06-24 ENCOUNTER — Other Ambulatory Visit: Payer: Self-pay

## 2022-06-26 ENCOUNTER — Other Ambulatory Visit: Payer: Self-pay | Admitting: Internal Medicine

## 2022-06-26 ENCOUNTER — Other Ambulatory Visit (HOSPITAL_COMMUNITY): Payer: Self-pay

## 2022-06-26 ENCOUNTER — Inpatient Hospital Stay: Payer: Medicare Other | Attending: Neurosurgery

## 2022-06-26 ENCOUNTER — Inpatient Hospital Stay: Payer: Medicare Other

## 2022-06-26 ENCOUNTER — Encounter: Payer: Self-pay | Admitting: Internal Medicine

## 2022-06-26 DIAGNOSIS — C711 Malignant neoplasm of frontal lobe: Secondary | ICD-10-CM

## 2022-06-26 DIAGNOSIS — Z923 Personal history of irradiation: Secondary | ICD-10-CM | POA: Insufficient documentation

## 2022-06-26 DIAGNOSIS — G4700 Insomnia, unspecified: Secondary | ICD-10-CM | POA: Insufficient documentation

## 2022-06-26 DIAGNOSIS — Z79899 Other long term (current) drug therapy: Secondary | ICD-10-CM | POA: Insufficient documentation

## 2022-06-26 MED ORDER — INV-RAMIPRIL 1.25 MG CAP WF-1801 (#112): 5.0000 mg | ORAL_CAPSULE | Freq: Every day | ORAL | Status: DC

## 2022-06-26 MED ORDER — ONDANSETRON HCL 8 MG PO TABS
8.0000 mg | ORAL_TABLET | Freq: Three times a day (TID) | ORAL | 1 refills | Status: DC | PRN
  Filled 2022-06-26: qty 30, 10d supply, fill #0

## 2022-06-26 NOTE — Progress Notes (Signed)
[facility] CSW Progress Note  Holiday representative met with patient to provide resources.  Patient was referred by Dr. Renda Rolls RN, Rhae Hammock.  CSW provided patient with a LTC Psychologist, counselling.  Patient expressed appreciation.    Jennifer Pickle Mitsuo Budnick, LCSW

## 2022-06-26 NOTE — Research (Signed)
[id number] A Single Arm, Pilot Study of Ramipril for Preventing Radiation-Induced Cognitive Decline in Glioblastoma (GBM) Patients Receiving Brain Radiotherapy   Patient presented to clinic accompanied by her friend for week 7 (week 6 titration) visit.    PROs: Completed per protocol.  LABS: Mandatory labs ([facility] CMP) collected per consent and study protocol on 06/23/22. Patient tolerated well without complaint.   VITAL SIGNS: Blood pressure today was 116/78.    CONCOMITANT MEDICATIONS: Concomitant medications reviewed. Patient verified that current medication list is correct; denied any new medications. Patient completed course of concomitant temodar '120mg'$  daily; taken 9/17-10/29/23. Patient also completed RT; received 9/18-10/30/23.   ADVERSE EVENTS: Adverse events assessed per protocol. Patient reported no new adverse events. Patient reported ongoing mild alopecia (grade 1) and ongoing fatigue (grade 1) relieved by rest. Patient reported resolution of nausea (grade 1) and headaches (grade 1) since completing temodar. Per Dr Mickeal Skinner, none of these adverse events are related to treatment with the study drug; no action or reporting is required at this time. See table below.   ADVERSE EVENT LOG: Study/Protocol: [id number]  Week 7 (Week 6 Titration)   Baseline: hypertension (grade 1); insomnia (grade 2).   Event Grade CTCAE v5.0 Onset Date Resolved Date Attribution to Study Treatment Treatment Comments  Nausea 1 05/11/22 06/23/22 Unrelated Zofran PRN    Alopecia 1 05/25/22 Ongoing Unrelated None    Headache 1 06/13/22 06/23/22 Unrelated  Intermittent; present prior to study participation  Fatigue 1 06/13/22 Ongoing Unrelated  Relieved by rest    STUDY DRUG: Patient returned study drug bottle (lot #[id number]) with 8 capsules remaining (133 total capsules dispensed - 8 remaining = 125 total capsules taken since start of study); count confirmed with Romualdo Bolk, PharmD. Medication diary reflects the  same: 28 capsules taken (7 doses of 4 pills/day) from 10/26 - 06/25/22 for a compliance rate of 100% for week 6. Bottle (lot #[id number]) returned to pharmacy. Additional drug was dispensed in two bottles containing 112 1.'25mg'$  capsules (rx no. [id number]; lot [id number]; exp. date 09/25/23). Distributed drug to patient with instructions to continue taking four pills daily at bedtime with or without food.   Returned original medication diary for November to patient; original October diary was completed, signed by patient, and collected by research. In addition, provided patient with diaries for the next four months of study drug treatment, as well as prepaid envelopes to return diaries to research department after completion. Patient verbalized understanding of all instructions, denied any questions or concerns at this time.   NEUROCOGNITIVE ASSESSMENT: Completed by Remer Macho, Clinical Research Specialist.    PLAN: Upon completion of all study requirements, patient and friend were escorted to the lobby. Patient informed that she is scheduled for an MRI on 07/16/22 and will have labs, an MD visit, and a research appointment on 07/21/22. Patient understands to bring study drug and medication diaries to appointments on 07/21/22 for 1 month post-RT visit. Patient thanked for her time and continued voluntary participation in this study. Patient has been provided direct contact information and is encouraged to contact this nurse for any questions or concerns.  Vickii Penna, RN, BSN, CPN Clinical Research Nurse I [phone]  06/26/2022 2:40 PM

## 2022-06-26 NOTE — Telephone Encounter (Signed)
Went to [facility] instead of [facility]

## 2022-07-02 ENCOUNTER — Encounter: Payer: Self-pay | Admitting: Internal Medicine

## 2022-07-02 NOTE — Progress Notes (Signed)
                                                                                                                                                             [name] MRN: [id number] DOB: 02/20/1956 Referring Physician: Ashok Pall (Profile Not Attached) Date of Service: 06/23/2022 [facility], [place]                                                        End Of Treatment Note  Diagnoses: C71.1-Malignant neoplasm of frontal lobe  Cancer Staging:   IDH wild-type glioblastoma of the left frontal lobe   Intent: Curative  Radiation Treatment Dates: 05/12/2022 through 06/23/2022 Site Technique Total Dose (Gy) Dose per Fx (Gy) Completed Fx Beam Energies  Brain: Brain IMRT 46/46 2 23/23 6X  Brain: Brain_Bst IMRT 14/14 2 7/7 6X   Narrative: The patient tolerated radiation therapy relatively well.    Plan: The patient will receive a call in about one month from the radiation oncology department. She will continue follow up with Dr. Mickeal Skinner as well.   ________________________________________________    Carola Rhine, [facility]

## 2022-07-10 ENCOUNTER — Encounter: Payer: Self-pay | Admitting: Internal Medicine

## 2022-07-11 ENCOUNTER — Other Ambulatory Visit: Payer: Self-pay

## 2022-07-16 ENCOUNTER — Ambulatory Visit (HOSPITAL_COMMUNITY)
Admission: RE | Admit: 2022-07-16 | Discharge: 2022-07-16 | Disposition: A | Discharge status: Home / Self Care | Payer: Medicare Other | Source: Ambulatory Visit | Attending: Internal Medicine | Admitting: Internal Medicine

## 2022-07-16 DIAGNOSIS — C711 Malignant neoplasm of frontal lobe: Secondary | ICD-10-CM | POA: Diagnosis present

## 2022-07-16 MED ORDER — GADOBUTROL 1 MMOL/ML IV SOLN
7.0000 mL | Freq: Once | INTRAVENOUS | Status: AC | PRN
  Administered 2022-07-16: 7 mL via INTRAVENOUS

## 2022-07-21 ENCOUNTER — Inpatient Hospital Stay (HOSPITAL_BASED_OUTPATIENT_CLINIC_OR_DEPARTMENT_OTHER): Payer: Medicare Other | Admitting: Internal Medicine

## 2022-07-21 ENCOUNTER — Other Ambulatory Visit: Payer: Self-pay

## 2022-07-21 ENCOUNTER — Inpatient Hospital Stay: Payer: Medicare Other

## 2022-07-21 ENCOUNTER — Other Ambulatory Visit (HOSPITAL_COMMUNITY): Payer: Self-pay

## 2022-07-21 ENCOUNTER — Telehealth: Payer: Self-pay | Admitting: Pharmacy Technician

## 2022-07-21 ENCOUNTER — Telehealth: Payer: Self-pay | Admitting: Pharmacist

## 2022-07-21 VITALS — BP 135/77 | HR 80 | Temp 97.8°F | Resp 16 | Ht 62.0 in | Wt 154.4 lb

## 2022-07-21 DIAGNOSIS — C711 Malignant neoplasm of frontal lobe: Secondary | ICD-10-CM

## 2022-07-21 DIAGNOSIS — Z79899 Other long term (current) drug therapy: Secondary | ICD-10-CM | POA: Diagnosis not present

## 2022-07-21 DIAGNOSIS — Z006 Encounter for examination for normal comparison and control in clinical research program: Secondary | ICD-10-CM

## 2022-07-21 DIAGNOSIS — Z923 Personal history of irradiation: Secondary | ICD-10-CM | POA: Diagnosis not present

## 2022-07-21 DIAGNOSIS — G4700 Insomnia, unspecified: Secondary | ICD-10-CM | POA: Diagnosis not present

## 2022-07-21 LAB — CBC WITH DIFFERENTIAL (CANCER CENTER ONLY)
Abs Immature Granulocytes: 0.02 10*3/uL (ref 0.00–0.07)
Basophils Absolute: 0.1 10*3/uL (ref 0.0–0.1)
Basophils Relative: 1 %
Eosinophils Absolute: 0.1 10*3/uL (ref 0.0–0.5)
Eosinophils Relative: 1 %
HCT: 40.7 % (ref 36.0–46.0)
Hemoglobin: 13.7 g/dL (ref 12.0–15.0)
Immature Granulocytes: 0 %
Lymphocytes Relative: 8 %
Lymphs Abs: 0.5 10*3/uL — ABNORMAL LOW (ref 0.7–4.0)
MCH: 31.9 pg (ref 26.0–34.0)
MCHC: 33.7 g/dL (ref 30.0–36.0)
MCV: 94.7 fL (ref 80.0–100.0)
Monocytes Absolute: 0.6 10*3/uL (ref 0.1–1.0)
Monocytes Relative: 9 %
Neutro Abs: 5.1 10*3/uL (ref 1.7–7.7)
Neutrophils Relative %: 81 %
Platelet Count: 174 10*3/uL (ref 150–400)
RBC: 4.3 MIL/uL (ref 3.87–5.11)
RDW: 13.2 % (ref 11.5–15.5)
WBC Count: 6.3 10*3/uL (ref 4.0–10.5)
nRBC: 0 % (ref 0.0–0.2)

## 2022-07-21 LAB — CMP (CANCER CENTER ONLY)
ALT: 13 U/L (ref 0–44)
AST: 13 U/L — ABNORMAL LOW (ref 15–41)
Albumin: 4.3 g/dL (ref 3.5–5.0)
Alkaline Phosphatase: 64 U/L (ref 38–126)
Anion gap: 3 — ABNORMAL LOW (ref 5–15)
BUN: 26 mg/dL — ABNORMAL HIGH (ref 8–23)
CO2: 27 mmol/L (ref 22–32)
Calcium: 9.5 mg/dL (ref 8.9–10.3)
Chloride: 110 mmol/L (ref 98–111)
Creatinine: 1.23 mg/dL — ABNORMAL HIGH (ref 0.44–1.00)
GFR, Estimated: 49 mL/min — ABNORMAL LOW (ref >60)
Glucose, Bld: 82 mg/dL (ref 70–99)
Potassium: 4 mmol/L (ref 3.5–5.1)
Sodium: 140 mmol/L (ref 135–145)
Total Bilirubin: 0.5 mg/dL (ref 0.3–1.2)
Total Protein: 6.7 g/dL (ref 6.5–8.1)

## 2022-07-21 MED ORDER — TEMOZOLOMIDE 250 MG PO CAPS
150.0000 mg/m2/d | ORAL_CAPSULE | Freq: Every day | ORAL | 0 refills | Status: DC
  Filled 2022-07-21: qty 5, 5d supply, fill #0

## 2022-07-21 MED ORDER — PROCHLORPERAZINE MALEATE 10 MG PO TABS
10.0000 mg | ORAL_TABLET | Freq: Four times a day (QID) | ORAL | 0 refills | Status: DC | PRN
  Filled 2022-07-21: qty 30, 8d supply, fill #0

## 2022-07-21 MED ORDER — TEMOZOLOMIDE 250 MG PO CAPS
150.0000 mg/m2/d | ORAL_CAPSULE | Freq: Every day | ORAL | 0 refills | Status: DC
  Filled 2022-07-21: qty 5, 28d supply, fill #0

## 2022-07-21 MED ORDER — INV-RAMIPRIL 1.25 MG CAP WF-1801 (#112): 5.0000 mg | ORAL_CAPSULE | Freq: Every day | ORAL | 0 refills | Status: DC

## 2022-07-21 NOTE — Telephone Encounter (Signed)
Oral Oncology Patient Advocate Encounter  After completing a benefits investigation, prior authorization for Temozolomide is not required at this time through [facility].  Patient's copay is $0.     Lady Deutscher, CPhT-Adv Oncology Pharmacy Patient [place] Direct Number: [phone]  Fax: [phone]

## 2022-07-21 NOTE — Telephone Encounter (Signed)
Oral Oncology Pharmacist Encounter  Received new prescription for Temodar (temozolomide) for the maintenance treatment of glioblastoma, planned duration ~6-12 months.  CBC w/ Diff and CMP from 07/21/22 assessed, patient SCr 1.23 mg/dL (CrCl ~50 mL/min). No baseline dose adjustments required. Prescription dose and frequency assessed for appropriateness. Appropriate for therapy initiation.   Current medication list in Epic reviewed, no relevant/significant DDIs with Temodar identified.  Evaluated chart and no patient barriers to medication adherence noted.   Patient agreement for treatment documented in MD note on 07/21/22.  Prescription has been e-scribed to the [facility] for benefits analysis and approval.  Oral Oncology Clinic will continue to follow for insurance authorization, copayment issues, initial counseling and start date.  Leron Croak, PharmD, BCPS, [facility] Hematology/Oncology Clinical Pharmacist [facility] [place] [phone] 07/21/2022 3:06 PM

## 2022-07-21 NOTE — Progress Notes (Signed)
[facility], [place] [address] [phone]   Interval Evaluation  Date of Service: 07/21/22 [name] Patient MRN: [id number] Patient DOB: 06/03/1956 Provider: Ventura Sellers, MD  Identifying Statement:  VICKKI IGOU is a 66 y.o. female with left frontal glioblastoma   Oncologic History: Oncology History  Glioblastoma multiforme of frontal lobe ([facility])  04/11/2022 Surgery   Craniotomy, resection of left frontal mass with Dr. Christella Noa; path is glioblastoma IDHwt.   05/12/2022 -  Chemotherapy   Patient is on Treatment Plan : BRAIN GLIOBLASTOMA Radiation Therapy With Concurrent Temozolomide 75 mg/m2 Daily Followed By Sequential Maintenance Temozolomide x 6-12 cycles       Biomarkers:  MGMT Unknown.  IDH 1/2 Wild type.  EGFR Unknown  TERT Unknown   Interval History: Jennifer [name] presents today for follow up, now having completed radiation and Temodar.  No new or progressive changes reported.  Headaches have improved since stopping the zofran.  Denies severe headaches or seizures.    H+P (05/05/22) Patient presented to neurologic attention in mid-August 2023 with new onset difficulty speaking, confusion noticed by family.  She and her family described sudden onset speech impairment and confusion in the afternoon of the 16th, after having been seen in normal state of health that morning.  CNS imaging demonstrated an enhancing left frontal mass, which was resected by Dr. Christella Noa on 04/11/22.  Following surgery she had some speech difficulty, but improved quickly through rehab admission.  At present, she is fully independent with gait, speech, ADLs.  She is off decadron and Keppra.  Retired, had worked at [employer] in [employer].  Today presents with son and daughter in law.    Medications: Current Outpatient Medications on File Prior to Visit  Medication Sig Dispense Refill   amLODipine (NORVASC) 5 MG  tablet TAKE 1 TABLET BY MOUTH EVERY DAY 90 tablet 1   atorvastatin (LIPITOR) 10 MG tablet TAKE 1 TABLET BY MOUTH EVERY DAY (Patient taking differently: Take 10 mg by mouth daily.) 90 tablet 1   busPIRone (BUSPAR) 5 MG tablet TAKE 1 TABLET BY MOUTH THREE TIMES A DAY (Patient taking differently: Take 5 mg by mouth 3 (three) times daily.) 270 tablet 1   fluticasone (FLONASE) 50 MCG/ACT nasal spray SPRAY 2 SPRAYS INTO EACH NOSTRIL EVERY DAY (Patient taking differently: Place 2 sprays into both nostrils daily.) 48 mL 0   Investigational Ramipril 1.25 MG capsule [id number] Take 4 capsules (5 mg total) by mouth at bedtime. Take at bedtime with or without food. Drink adequate water to avoid dehydration. Avoid salt substitutes that are high in potassium. 224 capsule    losartan (COZAAR) 50 MG tablet TAKE 1 TABLET BY MOUTH EVERY DAY 90 tablet 1   ondansetron (ZOFRAN) 8 MG tablet Take 1 tablet (8 mg total) by mouth every 8 (eight) hours as needed for nausea or vomiting. May take 30-60 minutes prior to Temodar administration if nausea/vomiting occurs as needed. 30 tablet 1   ondansetron (ZOFRAN) 8 MG tablet Take 1 tablet (8 mg total) by mouth every 8 (eight) hours as needed for nausea or vomiting. May take 30-60 minutes prior to Temodar administration if nausea/vomiting occurs as needed. 30 tablet 1   oxybutynin (DITROPAN-XL) 5 MG 24 hr tablet TAKE 1 TABLET BY MOUTH EVERYDAY AT BEDTIME 90 tablet 1   SUMAtriptan (IMITREX) 50 MG tablet Take 50 mg by mouth every 2 (two) hours as needed for  migraine. May repeat in 2 hours if headache persists or recurs.     temozolomide (TEMODAR) 100 MG capsule Take 1 capsule (100 mg total) by mouth daily. May take on an empty stomach to decrease nausea & vomiting. 42 capsule 0   temozolomide (TEMODAR) 20 MG capsule Take 1 capsule (20 mg total) by mouth daily. May take on an empty stomach to decrease nausea & vomiting. 42 capsule 0   topiramate (TOPAMAX) 50 MG tablet TAKE 1 TABLET BY MOUTH  TWICE A DAY (Patient taking differently: Take 50 mg by mouth 2 (two) times daily.) 60 tablet 0   traMADol (ULTRAM) 50 MG tablet Take 50 mg by mouth every 8 (eight) hours as needed.     traZODone (DESYREL) 50 MG tablet Take 1 tablet (50 mg total) by mouth at bedtime as needed for sleep. 30 tablet 2   No current facility-administered medications on file prior to visit.    Allergies: No Known Allergies Past Medical History:  Past Medical History:  Diagnosis Date   Hyperlipidemia    Hypertension    Migraine    Past Surgical History:  Past Surgical History:  Procedure Laterality Date   APPENDECTOMY     APPLICATION OF CRANIAL NAVIGATION N/A 04/11/2022   Procedure: APPLICATION OF CRANIAL NAVIGATION;  Surgeon: Ashok Pall, MD;  Location: [facility];  Service: Neurosurgery;  Laterality: N/A;   CRANIOTOMY Left 04/11/2022   Procedure: LEFT FRONTAL CRANIOTOMY FOR RESECTION OF TUMOR WITH Lucky Rathke;  Surgeon: Ashok Pall, MD;  Location: [facility];  Service: Neurosurgery;  Laterality: Left;  RM 21   LAPAROSCOPIC APPENDECTOMY  07/15/2012   Procedure: APPENDECTOMY LAPAROSCOPIC;  Surgeon: Zenovia Jarred, MD;  Location: [facility];  Service: General;  Laterality: N/A;  Laparoscopic Appendectomy   Social History:  Social History   Socioeconomic History   Marital status: Widowed    Spouse name: Not on file   Number of children: 2   Years of education: Not on file   Highest education level: Not on file  Occupational History   Occupation: retail    Comment: [employer] in [place], [place]  Tobacco Use   Smoking status: Never   Smokeless tobacco: Never  Substance and Sexual Activity   Alcohol use: Yes    Comment: 2 drinks once every 2-3 months   Drug use: No   Sexual activity: Not on file  Other Topics Concern   Not on file  Social History Narrative   Marital status: widowed since 2008; husband was bipolar and alcoholic      Children: 2 children (28, 59); son lives with patient.Daughter lives in  [place]/autism/does not drive.  Daughter lives with mother-in-law.  One grandpuppy      Lives: with son      Employment: works at [employer]; also works in [place].      Tobacco: none      Alcohol: none; husband was an alcoholic.      Drugs:   none      Exercise:   Social Determinants of Health   Financial Resource Strain: Not on file  Food Insecurity: Not on file  Transportation Needs: Not on file  Physical Activity: Not on file  Stress: Not on file  Social Connections: Not on file  Intimate Partner Violence: Not on file   Family History:  Family History  Adopted: Yes    Review of Systems: Constitutional: Doesn't report fevers, chills or abnormal weight loss Eyes: Doesn't report blurriness of vision Ears, nose, mouth,  throat, and face: Doesn't report sore throat Respiratory: Doesn't report cough, dyspnea or wheezes Cardiovascular: Doesn't report palpitation, chest discomfort  Gastrointestinal:  Doesn't report nausea, constipation, diarrhea GU: Doesn't report incontinence Skin: Doesn't report skin rashes Neurological: Per HPI Musculoskeletal: Doesn't report joint pain Behavioral/Psych: Doesn't report anxiety  Physical Exam: Vitals:   07/21/22 1214  BP: 135/77  Pulse: 80  Resp: 16  Temp: 97.8 F (36.6 C)  SpO2: 99%   KPS: 80 ECOG: 1 General: Alert, cooperative, pleasant, in no acute distress Head: Normal EENT: No conjunctival injection or scleral icterus.  Lungs: Resp effort normal Cardiac: Regular rate Abdomen: Non-distended abdomen Skin: No rashes cyanosis or petechiae. Extremities: No clubbing or edema  Neurologic Exam: Mental Status: Awake, alert, attentive to examiner. Oriented to self and environment. Language is fluent with intact comprehension.  Pseudobulbar elements. Cranial Nerves: Visual acuity is grossly normal. Visual fields are full. Extra-ocular movements intact. No ptosis. Face is symmetric Motor: Tone and bulk are  normal. Power is full in both arms and legs. Reflexes are symmetric, no pathologic reflexes present.  Sensory: Intact to light touch Gait: Normal.   Labs: I have reviewed the data as listed    Component Value Date/Time   NA 143 06/17/2022 1548   NA 140 10/04/2018 1703   K 4.1 06/17/2022 1548   CL 110 06/17/2022 1548   CO2 27 06/17/2022 1548   GLUCOSE 84 06/17/2022 1548   BUN 22 06/17/2022 1548   BUN 24 10/04/2018 1703   CREATININE 1.21 (H) 06/17/2022 1548   CREATININE 1.06 (H) 01/29/2016 1518   CALCIUM 9.1 06/17/2022 1548   PROT 6.4 (L) 06/17/2022 1548   PROT 6.8 10/04/2018 1703   ALBUMIN 4.0 06/17/2022 1548   ALBUMIN 4.6 10/04/2018 1703   AST 13 (L) 06/17/2022 1548   ALT 10 06/17/2022 1548   ALKPHOS 57 06/17/2022 1548   BILITOT 0.3 06/17/2022 1548   GFRNONAA 50 (L) 06/17/2022 1548   GFRNONAA 58 (L) 01/29/2016 1518   GFRAA 70 10/04/2018 1703   GFRAA 67 01/29/2016 1518   Lab Results  Component Value Date   WBC 6.8 06/17/2022   NEUTROABS 5.7 06/17/2022   HGB 12.5 06/17/2022   HCT 38.6 06/17/2022   MCV 95.8 06/17/2022   PLT 187 06/17/2022    Imaging:  [facility] Clinician Interpretation: I have personally reviewed the CNS images as listed.  My interpretation, in the context of the patient's clinical presentation, is likely treatment effect  MR BRAIN W WO CONTRAST  Result Date: 07/18/2022 CLINICAL DATA:  Brain/CNS neoplasm, assess treatment response. Left frontal glioblastoma status post surgery on 04/11/2022 and subsequent treatment with Temodar and radiation. EXAM: MRI HEAD WITHOUT AND WITH CONTRAST TECHNIQUE: Multiplanar, multiecho pulse sequences of the brain and surrounding structures were obtained without and with intravenous contrast. CONTRAST:  5m GADAVIST GADOBUTROL 1 MMOL/ML IV SOLN COMPARISON:  Head MRI 04/12/2022 FINDINGS: Brain: Sequelae of left frontal craniotomy and tumor resection are again identified. A resection cavity in the left frontal lobe has decreased  in size and contains chronic blood products. There is a small residual overlying extra-axial fluid collection with interval resolution of pneumocephalus. Regional mass effect has largely resolved, and there is re-expansion of the frontal horns of the lateral ventricles and no residual midline shift. Enhancement along the margins of the resection cavity is largely new/increased compared to the prior study. This enhancement is mildly thick and largely smooth, although there are small areas of nodular enhancement (for example posterosuperior to the  medial aspect of the resection cavity on series 16, image 122). Smooth dural enhancement over the left cerebral convexity is likely postoperative. There is a moderate amount of nonenhancing T2 hyperintensity in the surrounding left frontal white matter which has likely mildly regressed from the prior MRI, although direct comparison is limited by evolving postoperative changes with decreased left frontal lobe mass effect compared to the immediate postoperative study. There is no evidence of an acute infarct or hydrocephalus. 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 Vascular: Major intracranial vascular flow voids are preserved. Skull and upper cervical spine: Left frontal craniotomy. No suspicious marrow lesion. Sinuses/Orbits: Unremarkable orbits. Paranasal sinuses and mastoid air cells are clear. Other: None. IMPRESSION: 1. Evolving postoperative changes in the left frontal lobe with decreased size of the resection cavity and decreased regional mass effect. No residual midline shift. 2. Increased enhancement along the margins of the resection cavity which is mildly nodular and indeterminate. Attention on follow-up. 3. Moderate amount of grossly nonprogressive nonenhancing T2 hyperintensity in the left frontal white matter. Electronically Signed   By: Logan Bores M.D.   On: 07/18/2022 12:30    Assessment/Plan Glioblastoma  multiforme of frontal lobe ([facility])  ROSHELLE TRAUB is clinically stable today, now having completed IMRT and Temodar.  MRI brain demonstrates post-RT enhancement likely c/w treatment effect.  We recommended initiating treatment with Temozolomide 178m/m2, on for five days and off for twenty three days in twenty eight day cycles. The patient will have a complete blood count performed on days 21 and 28 of each cycle, and a comprehensive metabolic panel performed on day 28 of each cycle. Labs may need to be performed more often. Zofran will prescribed for home use for nausea/vomiting.   Informed consent was obtained verbally at bedside to proceed with oral chemotherapy.  Chemotherapy should be held for the following:  ANC less than 1,000  Platelets less than 100,000  LFT or creatinine greater than 2x ULN  If clinical concerns/contraindications develop  We recommended she continue Topamax 539mBID for migraine and seizure prevention.  May con't Trazodone 5060mS for insomnia.  She will continue with "Testing Ramipril to Prevent Memory Loss in Patients with Glioblastoma"  study protocol.  We ask that KatRADIAH LUBINSKIturn to clinic in 1 months with labs for evaluation prior to cycle #2, or sooner as needed.  All questions were answered. The patient knows to call the clinic with any problems, questions or concerns. No barriers to learning were detected.  The total time spent in the encounter was 40 minutes and more than 50% was on counseling and review of test results   ZacVentura SellersD Medical Director of Neuro-Oncology [facility] [place]/27/23 12:17 PM

## 2022-07-21 NOTE — Telephone Encounter (Signed)
Oral Chemotherapy Pharmacist Encounter  I spoke with patient for overview of: Temodar (temozolomide) for the maintenance treatment of glioblastoma multiforme, planned duration 6-12 months of treatment.  Counseled on administration, dosing, side effects, monitoring, drug-food interactions, safe handling, storage, and disposal.  Patient will take Temodar '250mg'$  capsules, 1 capsule, '250mg'$  total daily dose, by mouth once daily, may take at bedtime and on an empty stomach to decrease nausea and vomiting.  If 1st cycle is well tolerated, patient informed that Temodar dose may be increased to 200 mg/m2 daily for 5 days on, 23 days off, repeated every 28 days for subsequent cycles   Patient will take Temodar daily for 5 days on, 23 days off, and repeated.  Temodar start date: 07/25/22   Patient will take prochlorperazine 10 mg tablet, 1 tablet by mouth 30-60 min prior to Temodar dose to help decrease N/V. She will also have Zofran '8mg'$  tablets available to her as well if needed for breakthrough nausea. Per patient, Zofran has previously caused headaches, which is why she is going to be using the prochlorperazine as nausea PPX (patient has hxo migraines).   Adverse effects include but are not limited to: nausea, vomiting, anorexia, GI upset, rash, and fatigue. Rare but serious adverse effects of pneumocystis pneumonia and secondary malignancy also discussed.  We discussed strategies to manage constipation if they occur secondary to ondansetron dosing.  PCP prophylaxis will not be initiated at this time, but may be added based on lymphocyte count in the future.  Reviewed importance of keeping a medication schedule and plan for any missed doses. No barriers to medication adherence identified.  Medication reconciliation performed and medication/allergy list updated.  All questions answered.  Ms. Rohrig voiced understanding and appreciation.   Medication education handout and medication calendar placed  in mail for patient. Patient knows to call the office with questions or concerns. Oral Chemotherapy Clinic phone number provided to patient.   Leron Croak, PharmD, BCPS, [facility] Hematology/Oncology Clinical Pharmacist [facility] and [place] [phone] 07/21/2022 3:29 PM

## 2022-07-21 NOTE — Research (Unsigned)
[id number] A Single Arm, Pilot Study of Ramipril for Preventing Radiation-Induced Cognitive Decline in Glioblastoma (GBM) Patients Receiving Brain Radiotherapy   Patient presented to clinic today (07/21/22) accompanied by her daughter in law for 1 month post RT visit.    PROs: Completed per protocol.   LABS: Mandatory labs (local CMP) collected per consent and study protocol today. Patient tolerated well without complaint.   VITAL SIGNS: Blood pressure today was 135/77.   CONCOMITANT MEDICATIONS: Concomitant medications reviewed. Patient verified that current medication list is correct; denied any new medications. Meloxicam was added but patient states she's been taking it for years for back pain.  Duplicate zofran was discontinued. Compazine added to take along with new Temodar prescription- patient to start taking both once they have been filled (ordered today by MD).   ADVERSE EVENTS: Adverse events assessed per protocol. Patient reported ongoing mild alopecia (grade 1) and ongoing fatigue (grade 1) relieved by rest. Patient reports mild intermittent headaches that were present prior to study start (Grade 1).  Hypertension present today (Grade 1), asymptomatic, hx of htn requiring medication at baseline.  Creatinine increased today (Grade 1), possibly related to study drug per MD.  All other abnormal labs today are considered clinically insignificant per provider.  Remaining AE's from today determined unrelated to study drug by MD; no action or reporting is required at this time. See table below.   ADVERSE EVENT LOG: Study/Protocol: [id number]  1 Month Post RT   Baseline: hypertension (grade 1); insomnia (grade 2).   Event Grade CTCAE v5.0 Onset Date Resolved Date Attribution to Study Treatment Treatment Comments  Alopecia 1 05/25/22 Ongoing Unrelated None     Headache 1 07/21/22 Ongoing Unrelated None Intermittent; present prior to study participation  Fatigue 1 06/13/22 Ongoing Unrelated None  Relieved by rest  Creatinine increased 1 07/21/22 Ongoing Possibly Related None   Hypertension 1 07/21/22 Ongoing Unrelated None Patient has history of htn requiring medication at baseline    STUDY DRUG: Patient returned one study drug bottle (lot #[id number]) with 12 capsules remaining and second study drug bottle (lot #[id number]) with 112 capsules remaining (224 total capsules dispensed - 124 remaining = 100 total capsules taken since last visit); count confirmed with Laddie Aquas, PharmD. Medication diary reflects the same: 100 capsules taken (25 doses of 4 pills/day) for a compliance rate of 100%. Both bottles returned to patient.  Additional drug was dispensed in two bottles containing 112 1.'25mg'$  capsules (both with rx no. [id number]; lot [id number]; exp. date 09/25/23)- total 224 capsules given. Distributed drug to patient with instructions to continue taking four pills daily at bedtime with or without food.    Returned original medication diary for November to patient after making a copy for records.  Verified that patient has diaries for the next four months of study drug treatment, as well as prepaid envelopes to return diaries to research department after completion. Patient verbalized understanding of all instructions, denied any questions or concerns at this time.   DISEASE STATUS AND TX PLAN: Per MD Vaslow patient's Brain MRI performed 07/16/22 showed stable disease, patient to start Temodar (5 doses/month).  MD Vaslow does not plan on using Optune at this time.   NEUROCOGNITIVE ASSESSMENT: Completed by Remer Macho, Clinical Research Specialist.    PLAN: Upon completion of all study requirements, patient and DIL were escorted to the lobby with belongings.  Patient aware to expect phone call in about a month for 2 month post RT visit.  Patient thanked for her  time and continued voluntary participation in this study. Patient has been provided direct contact information and is encouraged to contact this nurse  for any questions or concerns.  [name], RN, BSN Clinical Research Nurse I 07/22/22 9:38 AM

## 2022-07-22 ENCOUNTER — Encounter: Payer: Self-pay | Admitting: Internal Medicine

## 2022-07-22 ENCOUNTER — Telehealth: Payer: Self-pay | Admitting: Internal Medicine

## 2022-07-22 ENCOUNTER — Other Ambulatory Visit (HOSPITAL_COMMUNITY): Payer: Self-pay

## 2022-07-22 NOTE — Telephone Encounter (Signed)
Per 11/27 los called and spoke to pt about appointment

## 2022-07-23 ENCOUNTER — Other Ambulatory Visit: Payer: Self-pay

## 2022-07-24 ENCOUNTER — Other Ambulatory Visit: Payer: Self-pay

## 2022-08-04 ENCOUNTER — Ambulatory Visit
Admission: RE | Admit: 2022-08-04 | Discharge: 2022-08-04 | Disposition: A | Discharge status: Home / Self Care | Payer: Medicare Other | Source: Ambulatory Visit | Attending: Radiation Oncology | Admitting: Radiation Oncology

## 2022-08-04 NOTE — Progress Notes (Signed)
  Radiation Oncology         [phone] ________________________________  Name: Jennifer [name] MRN: [id number]  Date of Service: 08/04/2022  DOB: [birth year]  Post Treatment Telephone Note  Diagnosis:  IDH wild-type glioblastoma of the left frontal lobe   Intent: Curative  Radiation Treatment Dates: 05/12/2022 through 06/23/2022 Site Technique Total Dose (Gy) Dose per Fx (Gy) Completed Fx Beam Energies  Brain: Brain IMRT 46/46 2 23/23 6X  Brain: Brain_Bst IMRT 14/14 2 7/7 6X   (as documented in provider EOT note)   The patient was available for call today.  The patient did note fatigue during radiation but has since improved. The patient did not note hair loss or skin changes in the field of radiation during therapy. The patient is not taking dexamethasone. The patient does not have symptoms of  weakness or loss of control of the extremities. The patient does not have symptoms of headache. The patient does not have symptoms of seizure or uncontrolled movement. The patient does not have symptoms of changes in vision. The patient does not have changes in speech. The patient does not have confusion.   The patient was counseled that she will be contacted by our brain and spine navigator to schedule surveillance imaging. The patient was encouraged to call if  she have not received a call to schedule imaging, or if she develop concerns or questions regarding radiation. The patient will also continue to follow up with Dr. Mickeal Skinner in medical oncology.  This concludes the interview.   Leandra Kern, LPN

## 2022-08-05 ENCOUNTER — Encounter (HOSPITAL_COMMUNITY): Payer: Self-pay

## 2022-08-07 ENCOUNTER — Other Ambulatory Visit (HOSPITAL_COMMUNITY): Payer: Self-pay

## 2022-08-08 ENCOUNTER — Other Ambulatory Visit: Payer: Self-pay | Admitting: Internal Medicine

## 2022-08-08 ENCOUNTER — Other Ambulatory Visit (HOSPITAL_COMMUNITY): Payer: Self-pay

## 2022-08-08 DIAGNOSIS — C711 Malignant neoplasm of frontal lobe: Secondary | ICD-10-CM

## 2022-08-08 MED ORDER — PROCHLORPERAZINE MALEATE 10 MG PO TABS
10.0000 mg | ORAL_TABLET | Freq: Four times a day (QID) | ORAL | 0 refills | Status: DC | PRN
  Filled 2022-08-08 – 2022-08-19 (×2): qty 30, 8d supply, fill #0

## 2022-08-09 ENCOUNTER — Other Ambulatory Visit (HOSPITAL_COMMUNITY): Payer: Self-pay

## 2022-08-13 ENCOUNTER — Other Ambulatory Visit (HOSPITAL_COMMUNITY): Payer: Self-pay

## 2022-08-15 ENCOUNTER — Other Ambulatory Visit (HOSPITAL_COMMUNITY): Payer: Self-pay

## 2022-08-19 ENCOUNTER — Other Ambulatory Visit: Payer: Self-pay

## 2022-08-19 ENCOUNTER — Other Ambulatory Visit (HOSPITAL_COMMUNITY): Payer: Self-pay

## 2022-08-20 ENCOUNTER — Telehealth: Payer: Self-pay | Admitting: *Deleted

## 2022-08-20 NOTE — Telephone Encounter (Signed)
REceived vm message from pt requesting refill of her Temodar. She has received the Compazine but not the Temodar.

## 2022-08-20 NOTE — Telephone Encounter (Signed)
Received call from pt regarding her next prescription of Temodar.  She states it did not come with her Comapzine. Advised that Dr. Mickeal Skinner will order her next round of Temodar tomorrow when he sees her in clinic. She gets this from [facility]. Advised to stop by that pharmacy after her appt with Dr. Mickeal Skinner, though she may need to wait a bit for them to get her prescription ready. Pt voiced understanding.

## 2022-08-21 ENCOUNTER — Inpatient Hospital Stay: Payer: Medicare Other | Attending: Neurosurgery

## 2022-08-21 ENCOUNTER — Other Ambulatory Visit (HOSPITAL_COMMUNITY): Payer: Self-pay

## 2022-08-21 ENCOUNTER — Inpatient Hospital Stay (HOSPITAL_BASED_OUTPATIENT_CLINIC_OR_DEPARTMENT_OTHER): Payer: Medicare Other | Admitting: Internal Medicine

## 2022-08-21 ENCOUNTER — Other Ambulatory Visit: Payer: Self-pay

## 2022-08-21 ENCOUNTER — Other Ambulatory Visit: Payer: Self-pay | Admitting: *Deleted

## 2022-08-21 VITALS — BP 173/84 | HR 80 | Temp 98.2°F | Resp 16 | Wt 156.8 lb

## 2022-08-21 DIAGNOSIS — Z923 Personal history of irradiation: Secondary | ICD-10-CM | POA: Diagnosis not present

## 2022-08-21 DIAGNOSIS — Z79899 Other long term (current) drug therapy: Secondary | ICD-10-CM | POA: Diagnosis not present

## 2022-08-21 DIAGNOSIS — C711 Malignant neoplasm of frontal lobe: Secondary | ICD-10-CM

## 2022-08-21 DIAGNOSIS — G4700 Insomnia, unspecified: Secondary | ICD-10-CM | POA: Insufficient documentation

## 2022-08-21 LAB — CBC WITH DIFFERENTIAL (CANCER CENTER ONLY)
Abs Immature Granulocytes: 0.02 10*3/uL (ref 0.00–0.07)
Basophils Absolute: 0 10*3/uL (ref 0.0–0.1)
Basophils Relative: 1 %
Eosinophils Absolute: 0.1 10*3/uL (ref 0.0–0.5)
Eosinophils Relative: 2 %
HCT: 42.3 % (ref 36.0–46.0)
Hemoglobin: 14.3 g/dL (ref 12.0–15.0)
Immature Granulocytes: 0 %
Lymphocytes Relative: 11 %
Lymphs Abs: 0.7 10*3/uL (ref 0.7–4.0)
MCH: 31.5 pg (ref 26.0–34.0)
MCHC: 33.8 g/dL (ref 30.0–36.0)
MCV: 93.2 fL (ref 80.0–100.0)
Monocytes Absolute: 0.5 10*3/uL (ref 0.1–1.0)
Monocytes Relative: 8 %
Neutro Abs: 5 10*3/uL (ref 1.7–7.7)
Neutrophils Relative %: 78 %
Platelet Count: 147 10*3/uL — ABNORMAL LOW (ref 150–400)
RBC: 4.54 MIL/uL (ref 3.87–5.11)
RDW: 13.2 % (ref 11.5–15.5)
WBC Count: 6.4 10*3/uL (ref 4.0–10.5)
nRBC: 0 % (ref 0.0–0.2)

## 2022-08-21 LAB — CMP (CANCER CENTER ONLY)
ALT: 10 U/L (ref 0–44)
AST: 13 U/L — ABNORMAL LOW (ref 15–41)
Albumin: 4.3 g/dL (ref 3.5–5.0)
Alkaline Phosphatase: 65 U/L (ref 38–126)
Anion gap: 4 — ABNORMAL LOW (ref 5–15)
BUN: 21 mg/dL (ref 8–23)
CO2: 26 mmol/L (ref 22–32)
Calcium: 9.4 mg/dL (ref 8.9–10.3)
Chloride: 111 mmol/L (ref 98–111)
Creatinine: 1.03 mg/dL — ABNORMAL HIGH (ref 0.44–1.00)
GFR, Estimated: >60 mL/min (ref >60)
Glucose, Bld: 99 mg/dL (ref 70–99)
Potassium: 3.9 mmol/L (ref 3.5–5.1)
Sodium: 141 mmol/L (ref 135–145)
Total Bilirubin: 0.5 mg/dL (ref 0.3–1.2)
Total Protein: 6.9 g/dL (ref 6.5–8.1)

## 2022-08-21 MED ORDER — TEMOZOLOMIDE 140 MG PO CAPS
140.0000 mg | ORAL_CAPSULE | Freq: Every day | ORAL | 0 refills | Status: DC
  Filled 2022-08-21: qty 5, 28d supply, fill #0
  Filled 2022-08-22 (×2): qty 5, 5d supply, fill #0

## 2022-08-21 MED ORDER — DEXAMETHASONE 2 MG PO TABS
2.0000 mg | ORAL_TABLET | Freq: Two times a day (BID) | ORAL | 0 refills | Status: DC
  Filled 2022-08-21 (×2): qty 5, 3d supply, fill #0

## 2022-08-21 MED ORDER — PROCHLORPERAZINE MALEATE 10 MG PO TABS
10.0000 mg | ORAL_TABLET | Freq: Four times a day (QID) | ORAL | 0 refills | Status: DC | PRN
  Filled 2022-08-21: qty 30, 8d supply, fill #0

## 2022-08-21 MED ORDER — DEXAMETHASONE 2 MG PO TABS
2.0000 mg | ORAL_TABLET | Freq: Every day | ORAL | 0 refills | Status: DC
  Filled 2022-08-21 (×2): qty 5, 5d supply, fill #0

## 2022-08-21 MED ORDER — TEMOZOLOMIDE 100 MG PO CAPS
200.0000 mg | ORAL_CAPSULE | Freq: Every day | ORAL | 0 refills | Status: DC
  Filled 2022-08-21: qty 10, 28d supply, fill #0
  Filled 2022-08-22 (×2): qty 10, 5d supply, fill #0

## 2022-08-21 NOTE — Progress Notes (Signed)
[facility] [place], [place] [address] [phone]   Interval Evaluation  Date of Service: 08/21/22 [name] Patient MRN: [id number] Patient DOB: 12/30/1955 Provider: Ventura Sellers, MD  Identifying Statement:  Jennifer [name] is a 66 y.o. female with left frontal glioblastoma   Oncologic History: Oncology History  Glioblastoma multiforme of frontal lobe ([place])  04/11/2022 Surgery   Craniotomy, resection of left frontal mass with Dr. Christella Noa; path is glioblastoma IDHwt.   05/12/2022 -  Chemotherapy   Patient is on Treatment Plan : BRAIN GLIOBLASTOMA Radiation Therapy With Concurrent Temozolomide 75 mg/m2 Daily Followed By Sequential Maintenance Temozolomide x 6-12 cycles       Biomarkers:  MGMT Unknown.  IDH 1/2 Wild type.  EGFR Unknown  TERT Unknown   Interval History: Jennifer [name] presents today for follow up, now having completed first cycle of Temodar.  Tolerated treatment well, no ill effects aside from some nausea managed with compazine.  No new or progressive changes reported.  Headaches have improved since stopping the zofran.  Denies severe headaches or seizures.    H+P (05/05/22) Patient presented to neurologic attention in mid-August 2023 with new onset difficulty speaking, confusion noticed by family.  She and her family described sudden onset speech impairment and confusion in the afternoon of the 16th, after having been seen in normal state of health that morning.  CNS imaging demonstrated an enhancing left frontal mass, which was resected by Dr. Christella Noa on 04/11/22.  Following surgery she had some speech difficulty, but improved quickly through rehab admission.  At present, she is fully independent with gait, speech, ADLs.  She is off decadron and Keppra.  Retired, had worked at [employer] in [employer].  Today presents with son and daughter in law.    Medications: Current Outpatient Medications  on File Prior to Visit  Medication Sig Dispense Refill   amLODipine (NORVASC) 5 MG tablet TAKE 1 TABLET BY MOUTH EVERY DAY 90 tablet 1   atorvastatin (LIPITOR) 10 MG tablet TAKE 1 TABLET BY MOUTH EVERY DAY (Patient taking differently: Take 10 mg by mouth daily.) 90 tablet 1   busPIRone (BUSPAR) 5 MG tablet TAKE 1 TABLET BY MOUTH THREE TIMES A DAY (Patient taking differently: Take 5 mg by mouth 3 (three) times daily.) 270 tablet 1   fluticasone (FLONASE) 50 MCG/ACT nasal spray SPRAY 2 SPRAYS INTO EACH NOSTRIL EVERY DAY (Patient taking differently: Place 2 sprays into both nostrils daily.) 48 mL 0   Investigational Ramipril 1.25 MG capsule [id number] Take 4 capsules (5 mg total) by mouth at bedtime. Take at bedtime with or without food. Drink adequate water to avoid dehydration. Avoid salt substitutes that are high in potassium. 224 capsule 0   losartan (COZAAR) 50 MG tablet TAKE 1 TABLET BY MOUTH EVERY DAY 90 tablet 1   meloxicam (MOBIC) 7.5 MG tablet Take 1 tablet by mouth 2 (two) times daily.     oxybutynin (DITROPAN-XL) 5 MG 24 hr tablet TAKE 1 TABLET BY MOUTH EVERYDAY AT BEDTIME 90 tablet 1   topiramate (TOPAMAX) 50 MG tablet TAKE 1 TABLET BY MOUTH TWICE A DAY (Patient taking differently: Take 50 mg by mouth 2 (two) times daily.) 60 tablet 0   ondansetron (ZOFRAN) 8 MG tablet Take 1 tablet (8 mg total) by mouth every 8 (eight) hours as needed for nausea or vomiting. May take 30-60 minutes prior to Temodar administration if nausea/vomiting occurs as needed. (  Patient not taking: Reported on 08/21/2022) 30 tablet 1   SUMAtriptan (IMITREX) 50 MG tablet Take 50 mg by mouth every 2 (two) hours as needed for migraine. May repeat in 2 hours if headache persists or recurs. (Patient not taking: Reported on 08/21/2022)     traMADol (ULTRAM) 50 MG tablet Take 50 mg by mouth every 8 (eight) hours as needed. (Patient not taking: Reported on 07/21/2022)     traZODone (DESYREL) 50 MG tablet Take 1 tablet (50 mg total)  by mouth at bedtime as needed for sleep. (Patient not taking: Reported on 07/21/2022) 30 tablet 2   No current facility-administered medications on file prior to visit.    Allergies: No Known Allergies Past Medical History:  Past Medical History:  Diagnosis Date   Hyperlipidemia    Hypertension    Migraine    Past Surgical History:  Past Surgical History:  Procedure Laterality Date   APPENDECTOMY     APPLICATION OF CRANIAL NAVIGATION N/A 04/11/2022   Procedure: APPLICATION OF CRANIAL NAVIGATION;  Surgeon: Ashok Pall, MD;  Location: [facility];  Service: Neurosurgery;  Laterality: N/A;   CRANIOTOMY Left 04/11/2022   Procedure: LEFT FRONTAL CRANIOTOMY FOR RESECTION OF TUMOR WITH Lucky Rathke;  Surgeon: Ashok Pall, MD;  Location: [facility];  Service: Neurosurgery;  Laterality: Left;  RM 21   LAPAROSCOPIC APPENDECTOMY  07/15/2012   Procedure: APPENDECTOMY LAPAROSCOPIC;  Surgeon: Zenovia Jarred, MD;  Location: [facility];  Service: General;  Laterality: N/A;  Laparoscopic Appendectomy   Social History:  Social History   Socioeconomic History   Marital status: Widowed    Spouse name: Not on file   Number of children: 2   Years of education: Not on file   Highest education level: Not on file  Occupational History   Occupation: retail    Comment: [employer] in [place], [place]  Tobacco Use   Smoking status: Never   Smokeless tobacco: Never  Substance and Sexual Activity   Alcohol use: Yes    Comment: 2 drinks once every 2-3 months   Drug use: No   Sexual activity: Not on file  Other Topics Concern   Not on file  Social History Narrative   Marital status: widowed since 2008; husband was bipolar and alcoholic      Children: 2 children (28, 70); son lives with patient.Daughter lives in [place]/autism/does not drive.  Daughter lives with mother-in-law.  One grandpuppy      Lives: with son      Employment: works at [employer]; also works in [place].      Tobacco: none       Alcohol: none; husband was an alcoholic.      Drugs:   none      Exercise:   Social Determinants of Health   Financial Resource Strain: Not on file  Food Insecurity: Not on file  Transportation Needs: Not on file  Physical Activity: Not on file  Stress: Not on file  Social Connections: Not on file  Intimate Partner Violence: Not on file   Family History:  Family History  Adopted: Yes    Review of Systems: Constitutional: Doesn't report fevers, chills or abnormal weight loss Eyes: Doesn't report blurriness of vision Ears, nose, mouth, throat, and face: Doesn't report sore throat Respiratory: Doesn't report cough, dyspnea or wheezes Cardiovascular: Doesn't report palpitation, chest discomfort  Gastrointestinal:  Doesn't report nausea, constipation, diarrhea GU: Doesn't report incontinence Skin: Doesn't report skin rashes Neurological: Per HPI Musculoskeletal: Doesn't report joint  pain Behavioral/Psych: Doesn't report anxiety  Physical Exam: Vitals:   08/21/22 1219  BP: (!) 173/84  Pulse: 80  Resp: 16  Temp: 98.2 F (36.8 C)  SpO2: 100%   KPS: 80 ECOG: 1 General: Alert, cooperative, pleasant, in no acute distress Head: Normal EENT: No conjunctival injection or scleral icterus.  Lungs: Resp effort normal Cardiac: Regular rate Abdomen: Non-distended abdomen Skin: No rashes cyanosis or petechiae. Extremities: No clubbing or edema  Neurologic Exam: Mental Status: Awake, alert, attentive to examiner. Oriented to self and environment. Language is fluent with intact comprehension.  Pseudobulbar elements. Cranial Nerves: Visual acuity is grossly normal. Visual fields are full. Extra-ocular movements intact. No ptosis. Face is symmetric Motor: Tone and bulk are normal. Power is full in both arms and legs. Reflexes are symmetric, no pathologic reflexes present.  Sensory: Intact to light touch Gait: Normal.   Labs: I have reviewed the data as listed    Component  Value Date/Time   NA 140 07/21/2022 1206   NA 140 10/04/2018 1703   K 4.0 07/21/2022 1206   CL 110 07/21/2022 1206   CO2 27 07/21/2022 1206   GLUCOSE 82 07/21/2022 1206   BUN 26 (H) 07/21/2022 1206   BUN 24 10/04/2018 1703   CREATININE 1.23 (H) 07/21/2022 1206   CREATININE 1.06 (H) 01/29/2016 1518   CALCIUM 9.5 07/21/2022 1206   PROT 6.7 07/21/2022 1206   PROT 6.8 10/04/2018 1703   ALBUMIN 4.3 07/21/2022 1206   ALBUMIN 4.6 10/04/2018 1703   AST 13 (L) 07/21/2022 1206   ALT 13 07/21/2022 1206   ALKPHOS 64 07/21/2022 1206   BILITOT 0.5 07/21/2022 1206   GFRNONAA 49 (L) 07/21/2022 1206   GFRNONAA 58 (L) 01/29/2016 1518   GFRAA 70 10/04/2018 1703   GFRAA 67 01/29/2016 1518   Lab Results  Component Value Date   WBC 6.4 08/21/2022   NEUTROABS 5.0 08/21/2022   HGB 14.3 08/21/2022   HCT 42.3 08/21/2022   MCV 93.2 08/21/2022   PLT 147 (L) 08/21/2022     Assessment/Plan Glioblastoma multiforme of frontal lobe (HCC)  TEKOA HAMOR is clinically stable today, now having completed cycle #1 of 5-day Temodar.    We recommended continuing treatment with cycle #2 Temozolomide, dose increased to 234m/m2, on for five days and off for twenty three days in twenty eight day cycles. The patient will have a complete blood count performed on days 21 and 28 of each cycle, and a comprehensive metabolic panel performed on day 28 of each cycle. Labs may need to be performed more often. Zofran will prescribed for home use for nausea/vomiting.   Chemotherapy should be held for the following:  ANC less than 1,000  Platelets less than 100,000  LFT or creatinine greater than 2x ULN  If clinical concerns/contraindications develop  We recommended she continue Topamax 562mBID for migraine and seizure prevention.  May con't Trazodone 501mS for insomnia.  Ok with decadron 2mg4mily x5 days to supplement Compazine for nausea.  She will continue with "Testing Ramipril to Prevent Memory Loss in  Patients with Glioblastoma"  study protocol.  We ask that KathJacquelin Hawkingurn to clinic in 1 months with MRI brain for evaluation prior to cycle #3, or sooner as needed.  All questions were answered. The patient knows to call the clinic with any problems, questions or concerns. No barriers to learning were detected.  The total time spent in the encounter was 30 minutes and more than  50% was on counseling and review of test results   Ventura Sellers, MD Medical Director of Neuro-Oncology [facility] 08/21/22 12:33 PM

## 2022-08-21 NOTE — Research (Signed)
[id number] A Single Arm, Pilot Study of Ramipril for Preventing Radiation-Induced Cognitive Decline in Glioblastoma (GBM) Patients Receiving Brain Radiotherapy   Patient presented to clinic accompanied by her daughter-in-law for her routine follow-up with Dr Mickeal Skinner; research RN completed 2 month post RT visit while patient was in clinic.     CONCOMITANT MEDICATIONS: Concomitant medications reviewed. Patient verified that current medication list is correct; denied any new medications. Patient confirmed that, after last visit, she completed the prescribed five day course of temodar '250mg'$  and took compazine '10mg'$  PRN from 12/1-12/5/23.   ADVERSE EVENTS: Adverse events assessed per protocol. Patient reported ongoing mild alopecia (grade 1) and ongoing fatigue (grade 1) relieved by rest. Patient also reported mild nausea (grade 1) during course of temodar that was managed by compazine PRN and resolved after completing chemotherapy. Hypertension (grade 3) present today; asymptomatic. Patient has history of hypertension requiring medication at baseline. Mild thrombocytopenia (grade 1) present on today's CMP. All adverse events and abnormal labs from today determined unrelated to study drug by MD; no action or reporting is required at this time. See table below.   ADVERSE EVENT LOG: Study/Protocol: [id number]  2 Month Post RT   Baseline: hypertension (grade 1); insomnia (grade 2).   Event Grade CTCAE v5.0 Onset Date Resolved Date Attribution to Study Treatment Treatment Comments  Alopecia 1 05/25/22 Ongoing Unrelated None     Fatigue 1 06/13/22 Ongoing Unrelated None Relieved by rest  Headache 1 07/21/22 07/25/22 Unrelated None Intermittent; present prior to study participation. Resolved after adjusting antiemetic regimen.  Nausea 1 07/25/22 07/30/22 Unrelated Compazine PRN Resolved after completing five day course of temodar.  Hypertension 3 08/21/22 Ongoing Unrelated None Grade 1 at baseline  Thrombocytopenia 1  08/21/22 Ongoing Unrelated None     STUDY DRUG: Patient did not bring medication diaries to clinic today but states that she has not missed any doses of ramipril. Completed November diary previously received via mail; patient reminded to send December diary in the same manner once completed and then to continue documenting medication compliance on provided monthly medication diaries. Patient to continue taking four pills daily at bedtime with or without food.   DISEASE STATUS AND TX PLAN: Per Dr Mickeal Skinner, patient to proceed with second cycle of temodar (5 doses/month) at an increased dose. Treatment plan does not involve use of Optune at this time.   PLAN: Patient reminded that I will contact her for her 3 month post-RT phone call between 1/15-1/29/24 unless she has a routine clinic visit within that window. Patient verbalized understanding. Patient thanked for her time and continued voluntary participation in this study. Patient has been provided direct contact information and is encouraged to contact this nurse for any questions or concerns.  Vickii Penna, RN, BSN, CPN Clinical Research Nurse I [phone]  08/21/2022 1:30 PM

## 2022-08-22 ENCOUNTER — Other Ambulatory Visit: Payer: Self-pay

## 2022-08-22 ENCOUNTER — Other Ambulatory Visit (HOSPITAL_COMMUNITY): Payer: Self-pay

## 2022-08-26 ENCOUNTER — Other Ambulatory Visit (HOSPITAL_COMMUNITY): Payer: Self-pay

## 2022-09-08 DIAGNOSIS — C711 Malignant neoplasm of frontal lobe: Secondary | ICD-10-CM

## 2022-09-08 NOTE — Research (Signed)
[id number] A Single Arm, Pilot Study of Ramipril for Preventing Radiation-Induced Cognitive Decline in Glioblastoma (GBM) Patients Receiving Brain Radiotherapy   Connected with patient via phone for her 3 month post RT visit. Identity confirmed using two patient identifiers.     CONCOMITANT MEDICATIONS: Concomitant medications reviewed. Patient verified that current medication list is correct; denied any new medications. Patient confirmed that, after last visit, she completed the prescribed five day course of temodar '340mg'$  and took compazine '10mg'$  PRN from 08/22/22-08/26/22.   ADVERSE EVENTS: Adverse events assessed per protocol. Patient reported no new adverse events. She noted ongoing mild alopecia (grade 1) and ongoing fatigue (grade 1) relieved by rest. Patient also reported mild nausea (grade 1) that recurred during course of temodar that was managed by compazine PRN and resolved after completing chemotherapy. See table below.   ADVERSE EVENT LOG: Study/Protocol: [id number]  3 Month Post RT   Baseline: hypertension (grade 1); insomnia (grade 2).   Event Grade CTCAE v5.0 Onset Date Resolved Date Attribution to Study Treatment Treatment Comments  Alopecia 1 05/25/22 Ongoing Unrelated None     Fatigue 1 06/13/22 Ongoing Unrelated None Relieved by rest  Nausea 1 08/22/22 08/27/22 Unrelated Compazine PRN Resolved after completing five day course of temodar.  Hypertension 3 08/21/22 Ongoing Unrelated None Grade 1 at baseline. Not reassessed at this time point (phone visit)  Thrombocytopenia 1 08/21/22 Ongoing Unrelated None Not reassessed at this time point (phone visit)    STUDY DRUG: As patient was not seen in clinic, medication diaries were not reviewed in-person. Patient reported she had not missed any doses of ramipril. Completed December diary previously received via mail; patient reminded to send January diary in the same manner once completed and then to continue documenting medication compliance on  provided monthly medication diaries. Patient to continue taking four pills daily at bedtime with or without food.   DISEASE STATUS AND TX PLAN: Patient will see Dr Mickeal Skinner on 09/22/22. Treatment plan does not involve use of Optune at this time.   PLAN: Patient reminded that research will see her in-clinic for her 4 month post-RT visit between 2/12-2/26/24; visit will include an MRI, standard-of-care labs and MD appointment, as well as a research appointment to complete neurocognitive assessments. Patient verbalized understanding. Patient thanked for her time and continued voluntary participation in this study. Patient has been provided direct contact information and is encouraged to contact this nurse for any questions or concerns.  Vickii Penna, RN, BSN, CPN Clinical Research Nurse I [phone]  09/08/2022 10:15 AM

## 2022-09-09 ENCOUNTER — Other Ambulatory Visit (HOSPITAL_COMMUNITY): Payer: Self-pay

## 2022-09-10 ENCOUNTER — Other Ambulatory Visit (HOSPITAL_COMMUNITY): Payer: Self-pay

## 2022-09-11 ENCOUNTER — Encounter: Payer: Self-pay | Admitting: Internal Medicine

## 2022-09-15 ENCOUNTER — Other Ambulatory Visit: Payer: Self-pay | Admitting: Radiation Therapy

## 2022-09-18 ENCOUNTER — Ambulatory Visit (HOSPITAL_COMMUNITY)
Admission: RE | Admit: 2022-09-18 | Discharge: 2022-09-18 | Disposition: A | Discharge status: Home / Self Care | Payer: Medicare Other | Source: Ambulatory Visit | Attending: Internal Medicine | Admitting: Internal Medicine

## 2022-09-18 DIAGNOSIS — C711 Malignant neoplasm of frontal lobe: Secondary | ICD-10-CM | POA: Diagnosis present

## 2022-09-18 MED ORDER — GADOBUTROL 1 MMOL/ML IV SOLN
7.0000 mL | Freq: Once | INTRAVENOUS | Status: AC | PRN
  Administered 2022-09-18: 7 mL via INTRAVENOUS

## 2022-09-19 ENCOUNTER — Other Ambulatory Visit: Payer: Self-pay

## 2022-09-22 ENCOUNTER — Other Ambulatory Visit (HOSPITAL_COMMUNITY): Payer: Self-pay

## 2022-09-22 ENCOUNTER — Encounter: Payer: Self-pay | Admitting: Internal Medicine

## 2022-09-22 ENCOUNTER — Inpatient Hospital Stay (HOSPITAL_BASED_OUTPATIENT_CLINIC_OR_DEPARTMENT_OTHER): Payer: Medicare Other | Admitting: Internal Medicine

## 2022-09-22 ENCOUNTER — Inpatient Hospital Stay: Payer: Medicare Other | Attending: Neurosurgery

## 2022-09-22 ENCOUNTER — Inpatient Hospital Stay: Payer: Medicare Other

## 2022-09-22 ENCOUNTER — Other Ambulatory Visit: Payer: Self-pay

## 2022-09-22 VITALS — BP 158/95 | HR 94 | Temp 97.9°F | Resp 18 | Wt 157.5 lb

## 2022-09-22 DIAGNOSIS — G4700 Insomnia, unspecified: Secondary | ICD-10-CM | POA: Insufficient documentation

## 2022-09-22 DIAGNOSIS — Z7952 Long term (current) use of systemic steroids: Secondary | ICD-10-CM | POA: Insufficient documentation

## 2022-09-22 DIAGNOSIS — Z79899 Other long term (current) drug therapy: Secondary | ICD-10-CM | POA: Diagnosis not present

## 2022-09-22 DIAGNOSIS — D696 Thrombocytopenia, unspecified: Secondary | ICD-10-CM | POA: Diagnosis not present

## 2022-09-22 DIAGNOSIS — R112 Nausea with vomiting, unspecified: Secondary | ICD-10-CM | POA: Diagnosis not present

## 2022-09-22 DIAGNOSIS — Z923 Personal history of irradiation: Secondary | ICD-10-CM | POA: Diagnosis not present

## 2022-09-22 DIAGNOSIS — C711 Malignant neoplasm of frontal lobe: Secondary | ICD-10-CM | POA: Diagnosis not present

## 2022-09-22 LAB — CBC WITH DIFFERENTIAL (CANCER CENTER ONLY)
Abs Immature Granulocytes: 0.01 10*3/uL (ref 0.00–0.07)
Basophils Absolute: 0 10*3/uL (ref 0.0–0.1)
Basophils Relative: 0 %
Eosinophils Absolute: 0.1 10*3/uL (ref 0.0–0.5)
Eosinophils Relative: 1 %
HCT: 36.1 % (ref 36.0–46.0)
Hemoglobin: 12.5 g/dL (ref 12.0–15.0)
Immature Granulocytes: 0 %
Lymphocytes Relative: 10 %
Lymphs Abs: 0.5 10*3/uL — ABNORMAL LOW (ref 0.7–4.0)
MCH: 32.2 pg (ref 26.0–34.0)
MCHC: 34.6 g/dL (ref 30.0–36.0)
MCV: 93 fL (ref 80.0–100.0)
Monocytes Absolute: 0.3 10*3/uL (ref 0.1–1.0)
Monocytes Relative: 6 %
Neutro Abs: 4.1 10*3/uL (ref 1.7–7.7)
Neutrophils Relative %: 83 %
Platelet Count: 84 10*3/uL — ABNORMAL LOW (ref 150–400)
RBC: 3.88 MIL/uL (ref 3.87–5.11)
RDW: 13 % (ref 11.5–15.5)
WBC Count: 4.9 10*3/uL (ref 4.0–10.5)
nRBC: 0 % (ref 0.0–0.2)

## 2022-09-22 LAB — CMP (CANCER CENTER ONLY)
ALT: 11 U/L (ref 0–44)
AST: 12 U/L — ABNORMAL LOW (ref 15–41)
Albumin: 3.9 g/dL (ref 3.5–5.0)
Alkaline Phosphatase: 69 U/L (ref 38–126)
Anion gap: 5 (ref 5–15)
BUN: 27 mg/dL — ABNORMAL HIGH (ref 8–23)
CO2: 25 mmol/L (ref 22–32)
Calcium: 9 mg/dL (ref 8.9–10.3)
Chloride: 112 mmol/L — ABNORMAL HIGH (ref 98–111)
Creatinine: 1.11 mg/dL — ABNORMAL HIGH (ref 0.44–1.00)
GFR, Estimated: 55 mL/min — ABNORMAL LOW (ref >60)
Glucose, Bld: 81 mg/dL (ref 70–99)
Potassium: 4.2 mmol/L (ref 3.5–5.1)
Sodium: 142 mmol/L (ref 135–145)
Total Bilirubin: 0.5 mg/dL (ref 0.3–1.2)
Total Protein: 6.5 g/dL (ref 6.5–8.1)

## 2022-09-22 MED ORDER — DEXAMETHASONE 2 MG PO TABS
2.0000 mg | ORAL_TABLET | Freq: Every day | ORAL | 0 refills | Status: DC
  Filled 2022-09-22 (×2): qty 5, 5d supply, fill #0

## 2022-09-22 MED ORDER — PROCHLORPERAZINE MALEATE 10 MG PO TABS
10.0000 mg | ORAL_TABLET | Freq: Four times a day (QID) | ORAL | 0 refills | Status: DC | PRN
  Filled 2022-09-22 – 2022-10-23 (×2): qty 30, 8d supply, fill #0

## 2022-09-22 MED ORDER — TEMOZOLOMIDE 250 MG PO CAPS
150.0000 mg/m2/d | ORAL_CAPSULE | Freq: Every day | ORAL | 0 refills | Status: DC
  Filled 2022-09-22 – 2022-09-23 (×3): qty 5, 5d supply, fill #0
  Filled 2022-09-24: qty 5, 28d supply, fill #0

## 2022-09-22 NOTE — Progress Notes (Signed)
[facility] [place], [place] [address] [phone]   Interval Evaluation  Date of Service: 09/22/22 [name] Patient MRN: [id number] Patient DOB: 10-22-1955 Provider: Ventura Sellers, MD  Identifying Statement:  Jennifer [name] is a 67 y.o. female with left frontal glioblastoma   Oncologic History: Oncology History  Glioblastoma multiforme of frontal lobe ([facility])  04/11/2022 Surgery   Craniotomy, resection of left frontal mass with Dr. Christella Noa; path is glioblastoma IDHwt.   05/12/2022 -  Chemotherapy   Patient is on Treatment Plan : BRAIN GLIOBLASTOMA Radiation Therapy With Concurrent Temozolomide 75 mg/m2 Daily Followed By Sequential Maintenance Temozolomide x 6-12 cycles       Biomarkers:  MGMT Unknown.  IDH 1/2 Wild type.  EGFR Unknown  TERT Unknown   Interval History: Jennifer [name] presents today for follow up, now having completed cycle #2 of Temodar.  She continued to experience nausea and vomiting as an effect of the 5 days of treatment.  She required dosing of compazine every 6 hours, also dosed the decadron '2mg'$  x5 days as instructed.  No new or progressive neurologic changes reported.  Headaches remain improved.  Denies severe headaches or seizures.    H+P (05/05/22) Patient presented to neurologic attention in mid-August 2023 with new onset difficulty speaking, confusion noticed by family.  She and her family described sudden onset speech impairment and confusion in the afternoon of the 16th, after having been seen in normal state of health that morning.  CNS imaging demonstrated an enhancing left frontal mass, which was resected by Dr. Christella Noa on 04/11/22.  Following surgery she had some speech difficulty, but improved quickly through rehab admission.  At present, she is fully independent with gait, speech, ADLs.  She is off decadron and Keppra.  Retired, had worked at [employer] in [employer].  Today  presents with son and daughter in law.    Medications: Current Outpatient Medications on File Prior to Visit  Medication Sig Dispense Refill   amLODipine (NORVASC) 5 MG tablet TAKE 1 TABLET BY MOUTH EVERY DAY 90 tablet 1   atorvastatin (LIPITOR) 10 MG tablet TAKE 1 TABLET BY MOUTH EVERY DAY (Patient taking differently: Take 10 mg by mouth daily.) 90 tablet 1   busPIRone (BUSPAR) 5 MG tablet TAKE 1 TABLET BY MOUTH THREE TIMES A DAY (Patient taking differently: Take 5 mg by mouth 3 (three) times daily.) 270 tablet 1   dexamethasone (DECADRON) 2 MG tablet Take 1 tablet (2 mg total) by mouth daily. 5 tablet 0   fluticasone (FLONASE) 50 MCG/ACT nasal spray SPRAY 2 SPRAYS INTO EACH NOSTRIL EVERY DAY (Patient taking differently: Place 2 sprays into both nostrils daily.) 48 mL 0   Investigational Ramipril 1.25 MG capsule [id number] Take 4 capsules (5 mg total) by mouth at bedtime. Take at bedtime with or without food. Drink adequate water to avoid dehydration. Avoid salt substitutes that are high in potassium. 224 capsule 0   losartan (COZAAR) 50 MG tablet TAKE 1 TABLET BY MOUTH EVERY DAY 90 tablet 1   meloxicam (MOBIC) 7.5 MG tablet Take 1 tablet by mouth 2 (two) times daily.     ondansetron (ZOFRAN) 8 MG tablet Take 1 tablet (8 mg total) by mouth every 8 (eight) hours as needed for nausea or vomiting. May take 30-60 minutes prior to Temodar administration if nausea/vomiting occurs as needed. 30 tablet 1   oxybutynin (DITROPAN-XL) 5 MG 24 hr tablet TAKE  1 TABLET BY MOUTH EVERYDAY AT BEDTIME 90 tablet 1   topiramate (TOPAMAX) 50 MG tablet TAKE 1 TABLET BY MOUTH TWICE A DAY (Patient taking differently: Take 50 mg by mouth 2 (two) times daily.) 60 tablet 0   prochlorperazine (COMPAZINE) 10 MG tablet Take 1 tablet (10 mg total) by mouth every 6 (six) hours as needed for nausea or vomiting. (Patient not taking: Reported on 08/21/2022) 30 tablet 0   SUMAtriptan (IMITREX) 50 MG tablet Take 50 mg by mouth every 2  (two) hours as needed for migraine. May repeat in 2 hours if headache persists or recurs. (Patient not taking: Reported on 08/21/2022)     traMADol (ULTRAM) 50 MG tablet Take 50 mg by mouth every 8 (eight) hours as needed. (Patient not taking: Reported on 07/21/2022)     traZODone (DESYREL) 50 MG tablet Take 1 tablet (50 mg total) by mouth at bedtime as needed for sleep. (Patient not taking: Reported on 07/21/2022) 30 tablet 2   No current facility-administered medications on file prior to visit.    Allergies: No Known Allergies Past Medical History:  Past Medical History:  Diagnosis Date   Hyperlipidemia    Hypertension    Migraine    Past Surgical History:  Past Surgical History:  Procedure Laterality Date   APPENDECTOMY     APPLICATION OF CRANIAL NAVIGATION N/A 04/11/2022   Procedure: APPLICATION OF CRANIAL NAVIGATION;  Surgeon: Ashok Pall, MD;  Location: [facility];  Service: Neurosurgery;  Laterality: N/A;   CRANIOTOMY Left 04/11/2022   Procedure: LEFT FRONTAL CRANIOTOMY FOR RESECTION OF TUMOR WITH Lucky Rathke;  Surgeon: Ashok Pall, MD;  Location: [facility];  Service: Neurosurgery;  Laterality: Left;  RM 21   LAPAROSCOPIC APPENDECTOMY  07/15/2012   Procedure: APPENDECTOMY LAPAROSCOPIC;  Surgeon: Zenovia Jarred, MD;  Location: [facility];  Service: General;  Laterality: N/A;  Laparoscopic Appendectomy   Social History:  Social History   Socioeconomic History   Marital status: Widowed    Spouse name: Not on file   Number of children: 2   Years of education: Not on file   Highest education level: Not on file  Occupational History   Occupation: retail    Comment: [employer] in [place], [place]  Tobacco Use   Smoking status: Never   Smokeless tobacco: Never  Substance and Sexual Activity   Alcohol use: Yes    Comment: 2 drinks once every 2-3 months   Drug use: No   Sexual activity: Not on file  Other Topics Concern   Not on file  Social History Narrative   Marital status: widowed  since 2008; husband was bipolar and alcoholic      Children: 2 children (28, 56); son lives with patient.Daughter lives in [place]/autism/does not drive.  Daughter lives with mother-in-law.  One grandpuppy      Lives: with son      Employment: works at [employer]; also works in [place].      Tobacco: none      Alcohol: none; husband was an alcoholic.      Drugs:   none      Exercise:   Social Determinants of Health   Financial Resource Strain: Not on file  Food Insecurity: Not on file  Transportation Needs: Not on file  Physical Activity: Not on file  Stress: Not on file  Social Connections: Not on file  Intimate Partner Violence: Not on file   Family History:  Family History  Adopted: Yes  Review of Systems: Constitutional: Doesn't report fevers, chills or abnormal weight loss Eyes: Doesn't report blurriness of vision Ears, nose, mouth, throat, and face: Doesn't report sore throat Respiratory: Doesn't report cough, dyspnea or wheezes Cardiovascular: Doesn't report palpitation, chest discomfort  Gastrointestinal:  Doesn't report nausea, constipation, diarrhea GU: Doesn't report incontinence Skin: Doesn't report skin rashes Neurological: Per HPI Musculoskeletal: Doesn't report joint pain Behavioral/Psych: Doesn't report anxiety  Physical Exam: Vitals:   09/22/22 1150  BP: (!) 158/95  Pulse: 94  Resp: 18  Temp: 97.9 F (36.6 C)  SpO2: 100%   KPS: 80 ECOG: 1 General: Alert, cooperative, pleasant, in no acute distress Head: Normal EENT: No conjunctival injection or scleral icterus.  Lungs: Resp effort normal Cardiac: Regular rate Abdomen: Non-distended abdomen Skin: No rashes cyanosis or petechiae. Extremities: No clubbing or edema  Neurologic Exam: Mental Status: Awake, alert, attentive to examiner. Oriented to self and environment. Language is fluent with intact comprehension.  Pseudobulbar elements. Cranial Nerves: Visual acuity is  grossly normal. Visual fields are full. Extra-ocular movements intact. No ptosis. Face is symmetric Motor: Tone and bulk are normal. Power is full in both arms and legs. Reflexes are symmetric, no pathologic reflexes present.  Sensory: Intact to light touch Gait: Normal.   Labs: I have reviewed the data as listed    Component Value Date/Time   NA 142 09/22/2022 1125   NA 140 10/04/2018 1703   K 4.2 09/22/2022 1125   CL 112 (H) 09/22/2022 1125   CO2 25 09/22/2022 1125   GLUCOSE 81 09/22/2022 1125   BUN 27 (H) 09/22/2022 1125   BUN 24 10/04/2018 1703   CREATININE 1.11 (H) 09/22/2022 1125   CREATININE 1.06 (H) 01/29/2016 1518   CALCIUM 9.0 09/22/2022 1125   PROT 6.5 09/22/2022 1125   PROT 6.8 10/04/2018 1703   ALBUMIN 3.9 09/22/2022 1125   ALBUMIN 4.6 10/04/2018 1703   AST 12 (L) 09/22/2022 1125   ALT 11 09/22/2022 1125   ALKPHOS 69 09/22/2022 1125   BILITOT 0.5 09/22/2022 1125   GFRNONAA 55 (L) 09/22/2022 1125   GFRNONAA 58 (L) 01/29/2016 1518   GFRAA 70 10/04/2018 1703   GFRAA 67 01/29/2016 1518   Lab Results  Component Value Date   WBC 4.9 09/22/2022   NEUTROABS 4.1 09/22/2022   HGB 12.5 09/22/2022   HCT 36.1 09/22/2022   MCV 93.0 09/22/2022   PLT 84 (L) 09/22/2022    Imaging:  [facility] Clinician Interpretation: I have personally reviewed the CNS images as listed.  My interpretation, in the context of the patient's clinical presentation, is stable disease  MR BRAIN W WO CONTRAST  Result Date: 09/19/2022 CLINICAL DATA:  67 year old female with left frontal glioblastoma, IDH wild-type. Status post surgery in August. Chemo radiation. Restaging. EXAM: MRI HEAD WITHOUT AND WITH CONTRAST TECHNIQUE: Multiplanar, multiecho pulse sequences of the brain and surrounding structures were obtained without and with intravenous contrast. CONTRAST:  20m GADAVIST GADOBUTROL 1 MMOL/ML IV SOLN COMPARISON:  Brain MRI 07/16/2022 and earlier. FINDINGS: Brain: Anterior left frontal lobe, middle  and superior frontal gyrus resection site with regional T2 and FLAIR hyperintensity minimally increased since November (posterior treatment area centrum semiovale series 9, image 36). No mass effect on the left frontal horn, mild ex vacuo enlargement there. Diffusion appears primarily facilitated. Stable regional hemosiderin. Small volume postoperative extra-axial collection and dural thickening there do not appear changed. Following contrast nodular, rim enhancement has mildly progressed along the posterosuperior margin on series 16, image 134. Other  irregular enhancement of the resection cavity is stable or has diminished including along the anterior margin. And best demonstrated on sagittal images the resection cavity has partially collapsed since November (series 18, image 17 now). No superimposed restricted diffusion to suggest acute infarction. No midline shift, ventriculomegaly, or acute intracranial hemorrhage. Cervicomedullary junction and pituitary are within normal limits. No remote areas of abnormal enhancement. Stable background gray and white matter signal with some chronic small vessel ischemic changes suspected in the left basal ganglia. Vascular: Major intracranial vascular flow voids are stable. Major dural venous sinuses are enhancing and appear to be patent. Skull and upper cervical spine: Stable craniotomy. Background bone marrow signal within normal limits. Negative visible cervical spine and spinal cord. Sinuses/Orbits: Stable, negative. Other: Mastoids remain clear. Visible internal auditory structures appear normal. Stable scalp soft tissues. IMPRESSION: 1. Generally favorable although slightly mixed response to treatment since November. Resection cavity appears smaller, but with minimally increased enhancement and FLAIR hyperintensity along the posterosuperior margin (series 16, image 134). 2. Stable overlying craniotomy changes. No new intracranial abnormality. Electronically Signed   By:  Genevie Ann M.D.   On: 09/19/2022 08:43     Assessment/Plan Glioblastoma multiforme of frontal lobe ([facility])  BOSTON CATARINO is clinically stable today, now having completed cycle #2 of 5-day Temodar.  MRI demonstrates stable findings, supportive of pseudoprogression on prior post-RT study.    Labs unfortunately demonstrate thrombocytopenia today.  We recommended continuing treatment with cycle #3 Temozolomide, dose reduced to '150mg'$ /m2, on for five days and off for twenty three days in twenty eight day cycles. The patient will have a complete blood count performed on days 21 and 28 of each cycle, and a comprehensive metabolic panel performed on day 28 of each cycle. Labs may need to be performed more often. Zofran will prescribed for home use for nausea/vomiting.   Cycle #3 will be HELD pending repeat labs in 2 weeks.  Chemotherapy should be held for the following:  ANC less than 1,000  Platelets less than 100,000  LFT or creatinine greater than 2x ULN  If clinical concerns/contraindications develop  We recommended she continue Topamax '50mg'$  BID for migraine and seizure prevention.  May con't Trazodone '50mg'$  HS for insomnia.  Ok with decadron '2mg'$  daily x5 days to supplement Compazine/Zofarn regimen for nausea.  If dose reduction ineffective, will consider zyprexa.  She will continue with "Testing Ramipril to Prevent Memory Loss in Patients with Glioblastoma"  study protocol.  We ask that LARRIE LUCIA return to clinic in 1 months with labs for evaluation prior to cycle #4, or sooner as needed.  All questions were answered. The patient knows to call the clinic with any problems, questions or concerns. No barriers to learning were detected.  The total time spent in the encounter was 40 minutes and more than 50% was on counseling and review of test results   Ventura Sellers, MD Medical Director of Neuro-Oncology [facility] 09/22/22 12:23 PM

## 2022-09-23 ENCOUNTER — Telehealth: Payer: Self-pay | Admitting: Internal Medicine

## 2022-09-23 ENCOUNTER — Other Ambulatory Visit: Payer: Self-pay

## 2022-09-23 ENCOUNTER — Other Ambulatory Visit (HOSPITAL_COMMUNITY): Payer: Self-pay

## 2022-09-23 NOTE — Telephone Encounter (Signed)
Called patient per 1/29 los notes to schedule f/u. Patient scheduled and notified.

## 2022-09-24 ENCOUNTER — Other Ambulatory Visit: Payer: Self-pay

## 2022-09-24 ENCOUNTER — Other Ambulatory Visit (HOSPITAL_COMMUNITY): Payer: Self-pay

## 2022-09-25 ENCOUNTER — Other Ambulatory Visit: Payer: Medicare Other

## 2022-09-26 ENCOUNTER — Other Ambulatory Visit: Payer: Self-pay

## 2022-09-28 ENCOUNTER — Other Ambulatory Visit: Payer: Self-pay

## 2022-09-29 ENCOUNTER — Telehealth: Payer: Self-pay | Admitting: Emergency Medicine

## 2022-09-29 NOTE — Telephone Encounter (Signed)
[id number] A Single Arm, Pilot Study of Ramipril for Preventing Radiation-Induced Cognitive Decline in Glioblastoma ([facility]) Patients Receiving Brain Radiotherapy  Called patient to remind her to bring drug/diaries with her to next appt.  Pt verbalized understanding, denies any questions/concerns at this time.  Wells Guiles 'Learta Codding' Neysa Bonito, RN, BSN, [facility] Clinical Research Nurse I 09/29/22 11:58 AM

## 2022-10-06 ENCOUNTER — Inpatient Hospital Stay: Payer: Medicare Other | Admitting: Emergency Medicine

## 2022-10-06 ENCOUNTER — Encounter: Payer: Self-pay | Admitting: Emergency Medicine

## 2022-10-06 ENCOUNTER — Inpatient Hospital Stay: Payer: Medicare Other

## 2022-10-06 ENCOUNTER — Telehealth: Payer: Medicare Other | Admitting: Internal Medicine

## 2022-10-06 ENCOUNTER — Inpatient Hospital Stay: Payer: Medicare Other | Attending: Neurosurgery | Admitting: Internal Medicine

## 2022-10-06 ENCOUNTER — Other Ambulatory Visit: Payer: Medicare Other

## 2022-10-06 VITALS — BP 147/92 | HR 87 | Temp 97.9°F | Resp 14 | Wt 159.6 lb

## 2022-10-06 DIAGNOSIS — Z79899 Other long term (current) drug therapy: Secondary | ICD-10-CM | POA: Insufficient documentation

## 2022-10-06 DIAGNOSIS — G4700 Insomnia, unspecified: Secondary | ICD-10-CM | POA: Insufficient documentation

## 2022-10-06 DIAGNOSIS — Z7952 Long term (current) use of systemic steroids: Secondary | ICD-10-CM | POA: Insufficient documentation

## 2022-10-06 DIAGNOSIS — R112 Nausea with vomiting, unspecified: Secondary | ICD-10-CM | POA: Insufficient documentation

## 2022-10-06 DIAGNOSIS — C711 Malignant neoplasm of frontal lobe: Secondary | ICD-10-CM | POA: Diagnosis not present

## 2022-10-06 DIAGNOSIS — Z923 Personal history of irradiation: Secondary | ICD-10-CM | POA: Diagnosis not present

## 2022-10-06 LAB — CMP (CANCER CENTER ONLY)
ALT: 11 U/L (ref 0–44)
AST: 13 U/L — ABNORMAL LOW (ref 15–41)
Albumin: 4.2 g/dL (ref 3.5–5.0)
Alkaline Phosphatase: 68 U/L (ref 38–126)
Anion gap: 3 — ABNORMAL LOW (ref 5–15)
BUN: 20 mg/dL (ref 8–23)
CO2: 26 mmol/L (ref 22–32)
Calcium: 9.1 mg/dL (ref 8.9–10.3)
Chloride: 112 mmol/L — ABNORMAL HIGH (ref 98–111)
Creatinine: 1.04 mg/dL — ABNORMAL HIGH (ref 0.44–1.00)
GFR, Estimated: 60 mL/min — ABNORMAL LOW (ref >60)
Glucose, Bld: 81 mg/dL (ref 70–99)
Potassium: 4.2 mmol/L (ref 3.5–5.1)
Sodium: 141 mmol/L (ref 135–145)
Total Bilirubin: 0.4 mg/dL (ref 0.3–1.2)
Total Protein: 6.6 g/dL (ref 6.5–8.1)

## 2022-10-06 LAB — CBC WITH DIFFERENTIAL (CANCER CENTER ONLY)
Abs Immature Granulocytes: 0.04 10*3/uL (ref 0.00–0.07)
Basophils Absolute: 0 10*3/uL (ref 0.0–0.1)
Basophils Relative: 1 %
Eosinophils Absolute: 0 10*3/uL (ref 0.0–0.5)
Eosinophils Relative: 1 %
HCT: 37.5 % (ref 36.0–46.0)
Hemoglobin: 13.1 g/dL (ref 12.0–15.0)
Immature Granulocytes: 1 %
Lymphocytes Relative: 14 %
Lymphs Abs: 0.6 10*3/uL — ABNORMAL LOW (ref 0.7–4.0)
MCH: 33.2 pg (ref 26.0–34.0)
MCHC: 34.9 g/dL (ref 30.0–36.0)
MCV: 95.2 fL (ref 80.0–100.0)
Monocytes Absolute: 0.5 10*3/uL (ref 0.1–1.0)
Monocytes Relative: 11 %
Neutro Abs: 3.1 10*3/uL (ref 1.7–7.7)
Neutrophils Relative %: 72 %
Platelet Count: 169 10*3/uL (ref 150–400)
RBC: 3.94 MIL/uL (ref 3.87–5.11)
RDW: 14.5 % (ref 11.5–15.5)
WBC Count: 4.2 10*3/uL (ref 4.0–10.5)
nRBC: 0 % (ref 0.0–0.2)

## 2022-10-06 NOTE — Research (Signed)
[facility] [id number] A Single Arm, Pilot Study of Ramipril for Preventing Radiation-Induced Cognitive Decline in Glioblastoma (GBM) Patients Receiving Brain Radiotherapy  4 Month Post RT Visit  Patient arrived today (10/06/22) to 4 month post RT appt with her daughter in law and a friend.    CONCOMITANT MEDICATIONS: Concomitant medications reviewed. Patient verified that current medication list is correct; denied any new medications. Patient's last Temodar cycle was delayed until today d/t low platelets.  Platelets have improved enough for her to start her next cycle (cycle 3) of Temodar today.  Since patient is stopping Ramipril study drug today and has a history of baseline hypertension she was advised to restart her two pre-study BP medications by MD Vaslow (amlodipine and losartan).  PROVIDER VISIT  Patient was seen by Dr. Mickeal Skinner today per protocol.  See provider note.  BP today 147/92.  No changes in medical history per patient.   NEUROCOGNITIVE TESTING & PROs  Patient completed Neurocognitive testing and PROs today per protocol.  Administered by Carol Ada, [place].  Verified completion.  ADVERSE EVENTS: CTCAE V. 5.0 Adverse events assessed per protocol. Patient reported no new adverse events. She noted ongoing mild alopecia (grade 1) and ongoing fatigue (grade 1) relieved by rest.  Hypertension decreased to Grade 2 (baseline Grade 1). Low platelets resolved from Grade 1.  Ongoing creatinine increase (Grade 1) since before starting study drug, no intervention per MD.  All other abnormal labs ruled clinically insignificant per MD. All AE's unrelated to study drug per MD, no action or reporting required.   ADVERSE EVENT LOG: Study/Protocol: [id number]  4 Month Post RT   Baseline: hypertension (grade 1); insomnia (grade 2).   Event Grade CTCAE v5.0 Onset Date Resolved Date Attribution to Study Treatment Treatment Comments  Alopecia 1 05/25/22 Ongoing Unrelated None     Fatigue 1 06/13/22 Ongoing Unrelated  None Relieved by rest  Hypertension 3 08/21/22 10/06/22 Unrelated None Grade 1 at baseline  Thrombocytopenia (low platelets) 1 08/21/22 10/06/22 Unrelated None   Hypertension 2 10/06/22 Ongoing Unrelated None Grade 1 at baseline  Creatinine increased 1 04/12/22 Ongoing Unrelated None Pre-study/ baseline condition    STUDY DRUG:  Patient returns 1 bottle of study drug today with 40 capsules remaining, states she threw the other empty bottles away.  Drug count corresponds with her reports of taking 4 capsules daily with no missed doses.  Patient is 100% compliant with medication administration.  Reviewed medication diaries for completion and signatures and collected them.  Verified 100% compliance on medication diaries.  Returned study drug to pharmacy, confirmed count with PharmD Theadora Rama who will destroy drug per protocol.   DISEASE STATUS AND TX PLAN: Brain MRI completed 09/18/22.  Stable disease per MD Vaslow.  Treatment plan does not involve use of Optune at this time.   PLAN:  Patient aware to expect final 5 month post RT call in about a month.  Patient thanked for her time and continued voluntary participation in this study. Patient has been provided direct contact information and is encouraged to contact this nurse for any questions or concerns.  Wells Guiles 'Learta Codding' Neysa Bonito, RN, BSN, [facility] Clinical Research Nurse I 10/06/22 4:55 PM

## 2022-10-06 NOTE — Progress Notes (Signed)
[facility] at [address], [place] [address] [phone]   Interval Evaluation  Date of Service: 10/06/22 [name] Patient MRN: [id number] Patient DOB: [birth year] Provider: Ventura Sellers, MD  Identifying Statement:  Jennifer [name] is a 67 y.o. female with left frontal glioblastoma   Oncologic History: Oncology History  Glioblastoma multiforme of frontal lobe ([facility])  04/11/2022 Surgery   Craniotomy, resection of left frontal mass with Dr. Christella Noa; path is glioblastoma IDHwt.   05/12/2022 -  Chemotherapy   Patient is on Treatment Plan : BRAIN GLIOBLASTOMA Radiation Therapy With Concurrent Temozolomide 75 mg/m2 Daily Followed By Sequential Maintenance Temozolomide x 6-12 cycles       Biomarkers:  MGMT Unknown.  IDH 1/2 Wild type.  EGFR Unknown  TERT Unknown   Interval History: Jennifer [name] presents today for follow up, now having completed cycle #2 of Temodar, recent labs.  No new or progressive neurologic changes reported compared to prior visit.  Headaches remain improved.  Denies severe headaches or seizures.    H+P (05/05/22) Patient presented to neurologic attention in mid-August 2023 with new onset difficulty speaking, confusion noticed by family.  She and her family described sudden onset speech impairment and confusion in the afternoon of the 16th, after having been seen in normal state of health that morning.  CNS imaging demonstrated an enhancing left frontal mass, which was resected by Dr. Christella Noa on 04/11/22.  Following surgery she had some speech difficulty, but improved quickly through rehab admission.  At present, she is fully independent with gait, speech, ADLs.  She is off decadron and Keppra.  Retired, had worked at [employer] in [employer].  Today presents with son and daughter in law.    Medications: Current Outpatient Medications on File Prior to Visit  Medication Sig Dispense Refill    amLODipine (NORVASC) 5 MG tablet TAKE 1 TABLET BY MOUTH EVERY DAY 90 tablet 1   atorvastatin (LIPITOR) 10 MG tablet TAKE 1 TABLET BY MOUTH EVERY DAY (Patient taking differently: Take 10 mg by mouth daily.) 90 tablet 1   busPIRone (BUSPAR) 5 MG tablet TAKE 1 TABLET BY MOUTH THREE TIMES A DAY (Patient taking differently: Take 5 mg by mouth 3 (three) times daily.) 270 tablet 1   dexamethasone (DECADRON) 2 MG tablet Take 1 tablet (2 mg total) by mouth daily. 5 tablet 0   fluticasone (FLONASE) 50 MCG/ACT nasal spray SPRAY 2 SPRAYS INTO EACH NOSTRIL EVERY DAY (Patient taking differently: Place 2 sprays into both nostrils daily.) 48 mL 0   Investigational Ramipril 1.25 MG capsule [id number] Take 4 capsules (5 mg total) by mouth at bedtime. Take at bedtime with or without food. Drink adequate water to avoid dehydration. Avoid salt substitutes that are high in potassium. 224 capsule 0   losartan (COZAAR) 50 MG tablet TAKE 1 TABLET BY MOUTH EVERY DAY 90 tablet 1   meloxicam (MOBIC) 7.5 MG tablet Take 1 tablet by mouth 2 (two) times daily.     ondansetron (ZOFRAN) 8 MG tablet Take 1 tablet (8 mg total) by mouth every 8 (eight) hours as needed for nausea or vomiting. May take 30-60 minutes prior to Temodar administration if nausea/vomiting occurs as needed. 30 tablet 1   oxybutynin (DITROPAN-XL) 5 MG 24 hr tablet TAKE 1 TABLET BY MOUTH EVERYDAY AT BEDTIME 90 tablet 1   prochlorperazine (COMPAZINE) 10 MG tablet Take 1 tablet (10 mg total) by mouth every 6 (six) hours  as needed for nausea or vomiting. 30 tablet 0   SUMAtriptan (IMITREX) 50 MG tablet Take 50 mg by mouth every 2 (two) hours as needed for migraine. May repeat in 2 hours if headache persists or recurs. (Patient not taking: Reported on 08/21/2022)     temozolomide (TEMODAR) 250 MG capsule Take 1 capsule (250 mg total) by mouth daily. Take for 5 days on, 23 days off. Repeat every 28 days. May take on an empty stomach to decrease nausea & vomiting. 5 capsule 0    topiramate (TOPAMAX) 50 MG tablet TAKE 1 TABLET BY MOUTH TWICE A DAY (Patient taking differently: Take 50 mg by mouth 2 (two) times daily.) 60 tablet 0   traMADol (ULTRAM) 50 MG tablet Take 50 mg by mouth every 8 (eight) hours as needed. (Patient not taking: Reported on 07/21/2022)     traZODone (DESYREL) 50 MG tablet Take 1 tablet (50 mg total) by mouth at bedtime as needed for sleep. (Patient not taking: Reported on 07/21/2022) 30 tablet 2   No current facility-administered medications on file prior to visit.    Allergies: No Known Allergies Past Medical History:  Past Medical History:  Diagnosis Date   Hyperlipidemia    Hypertension    Migraine    Past Surgical History:  Past Surgical History:  Procedure Laterality Date   APPENDECTOMY     APPLICATION OF CRANIAL NAVIGATION N/A 04/11/2022   Procedure: APPLICATION OF CRANIAL NAVIGATION;  Surgeon: Ashok Pall, MD;  Location: [facility];  Service: Neurosurgery;  Laterality: N/A;   CRANIOTOMY Left 04/11/2022   Procedure: LEFT FRONTAL CRANIOTOMY FOR RESECTION OF TUMOR WITH Lucky Rathke;  Surgeon: Ashok Pall, MD;  Location: [facility];  Service: Neurosurgery;  Laterality: Left;  RM 21   LAPAROSCOPIC APPENDECTOMY  07/15/2012   Procedure: APPENDECTOMY LAPAROSCOPIC;  Surgeon: Zenovia Jarred, MD;  Location: [facility];  Service: General;  Laterality: N/A;  Laparoscopic Appendectomy   Social History:  Social History   Socioeconomic History   Marital status: Widowed    Spouse name: Not on file   Number of children: 2   Years of education: Not on file   Highest education level: Not on file  Occupational History   Occupation: retail    Comment: [employer] in [place], [place]  Tobacco Use   Smoking status: Never   Smokeless tobacco: Never  Substance and Sexual Activity   Alcohol use: Yes    Comment: 2 drinks once every 2-3 months   Drug use: No   Sexual activity: Not on file  Other Topics Concern   Not on file  Social History Narrative   Marital  status: widowed since 2008; husband was bipolar and alcoholic      Children: 2 children (28, 75); son lives with patient.Daughter lives in [place]/autism/does not drive.  Daughter lives with mother-in-law.  One grandpuppy      Lives: with son      Employment: works at [employer]; also works in [place].      Tobacco: none      Alcohol: none; husband was an alcoholic.      Drugs:   none      Exercise:   Social Determinants of Health   Financial Resource Strain: Not on file  Food Insecurity: Not on file  Transportation Needs: Not on file  Physical Activity: Not on file  Stress: Not on file  Social Connections: Not on file  Intimate Partner Violence: Not on file   Family History:  Family  History  Adopted: Yes    Review of Systems: Constitutional: Doesn't report fevers, chills or abnormal weight loss Eyes: Doesn't report blurriness of vision Ears, nose, mouth, throat, and face: Doesn't report sore throat Respiratory: Doesn't report cough, dyspnea or wheezes Cardiovascular: Doesn't report palpitation, chest discomfort  Gastrointestinal:  Doesn't report nausea, constipation, diarrhea GU: Doesn't report incontinence Skin: Doesn't report skin rashes Neurological: Per HPI Musculoskeletal: Doesn't report joint pain Behavioral/Psych: Doesn't report anxiety  Physical Exam: Vitals:   10/06/22 1353  BP: (!) 147/92  Pulse: 87  Resp: 14  Temp: 97.9 F (36.6 C)  SpO2: 99%   KPS: 80 ECOG: 1 General: Alert, cooperative, pleasant, in no acute distress Head: Normal EENT: No conjunctival injection or scleral icterus.  Lungs: Resp effort normal Cardiac: Regular rate Abdomen: Non-distended abdomen Skin: No rashes cyanosis or petechiae. Extremities: No clubbing or edema  Neurologic Exam: Mental Status: Awake, alert, attentive to examiner. Oriented to self and environment. Language is fluent with intact comprehension.  Pseudobulbar elements. Cranial Nerves:  Visual acuity is grossly normal. Visual fields are full. Extra-ocular movements intact. No ptosis. Face is symmetric Motor: Tone and bulk are normal. Power is full in both arms and legs. Reflexes are symmetric, no pathologic reflexes present.  Sensory: Intact to light touch Gait: Normal.   Labs: I have reviewed the data as listed    Component Value Date/Time   NA 142 09/22/2022 1125   NA 140 10/04/2018 1703   K 4.2 09/22/2022 1125   CL 112 (H) 09/22/2022 1125   CO2 25 09/22/2022 1125   GLUCOSE 81 09/22/2022 1125   BUN 27 (H) 09/22/2022 1125   BUN 24 10/04/2018 1703   CREATININE 1.11 (H) 09/22/2022 1125   CREATININE 1.06 (H) 01/29/2016 1518   CALCIUM 9.0 09/22/2022 1125   PROT 6.5 09/22/2022 1125   PROT 6.8 10/04/2018 1703   ALBUMIN 3.9 09/22/2022 1125   ALBUMIN 4.6 10/04/2018 1703   AST 12 (L) 09/22/2022 1125   ALT 11 09/22/2022 1125   ALKPHOS 69 09/22/2022 1125   BILITOT 0.5 09/22/2022 1125   GFRNONAA 55 (L) 09/22/2022 1125   GFRNONAA 58 (L) 01/29/2016 1518   GFRAA 70 10/04/2018 1703   GFRAA 67 01/29/2016 1518   Lab Results  Component Value Date   WBC 4.2 10/06/2022   NEUTROABS 3.1 10/06/2022   HGB 13.1 10/06/2022   HCT 37.5 10/06/2022   MCV 95.2 10/06/2022   PLT 169 10/06/2022     Assessment/Plan Glioblastoma multiforme of frontal lobe (HCC)  Jennifer [name] is clinically stable today, now having completed cycle #2 of 5-day Temodar.    Labs demonstrate improvement in thrombocytopenia today.  We recommended continuing treatment with cycle #3 Temozolomide, dose reduced to 121m/m2, on for five days and off for twenty three days in twenty eight day cycles. The patient will have a complete blood count performed on days 21 and 28 of each cycle, and a comprehensive metabolic panel performed on day 28 of each cycle. Labs may need to be performed more often. Zofran will prescribed for home use for nausea/vomiting.   Chemotherapy should be held for the following:  ANC  less than 1,000  Platelets less than 100,000  LFT or creatinine greater than 2x ULN  If clinical concerns/contraindications develop  We recommended she continue Topamax 533mBID for migraine and seizure prevention.  May con't Trazodone 5033mS for insomnia.  Ok with decadron 2mg61mily x5 days to supplement Compazine/Zofarn regimen for nausea.  If  dose reduction ineffective, will consider zyprexa.  We ask that Jennifer [name] return to clinic in 1 months with labs for evaluation prior to cycle #4, or sooner as needed.  All questions were answered. The patient knows to call the clinic with any problems, questions or concerns. No barriers to learning were detected.  The total time spent in the encounter was 30 minutes and more than 50% was on counseling and review of test results   Ventura Sellers, MD Medical Director of Neuro-Oncology [facility] [place] 10/06/22 1:55 PM

## 2022-10-08 ENCOUNTER — Other Ambulatory Visit: Payer: Self-pay

## 2022-10-13 ENCOUNTER — Other Ambulatory Visit: Payer: Self-pay

## 2022-10-21 ENCOUNTER — Telehealth: Payer: Self-pay | Admitting: Internal Medicine

## 2022-10-21 NOTE — Telephone Encounter (Signed)
Called patient regarding upcoming March appointment. Patient is notified.

## 2022-10-23 ENCOUNTER — Other Ambulatory Visit: Payer: Self-pay | Admitting: Internal Medicine

## 2022-10-23 ENCOUNTER — Other Ambulatory Visit (HOSPITAL_COMMUNITY): Payer: Self-pay

## 2022-10-23 DIAGNOSIS — C711 Malignant neoplasm of frontal lobe: Secondary | ICD-10-CM

## 2022-10-23 MED ORDER — DEXAMETHASONE 2 MG PO TABS
2.0000 mg | ORAL_TABLET | Freq: Every day | ORAL | 0 refills | Status: DC
  Filled 2022-10-23: qty 5, 5d supply, fill #0

## 2022-10-23 MED ORDER — TEMOZOLOMIDE 250 MG PO CAPS
150.0000 mg/m2/d | ORAL_CAPSULE | Freq: Every day | ORAL | 0 refills | Status: DC
  Filled 2022-10-23: qty 5, 28d supply, fill #0

## 2022-10-27 ENCOUNTER — Telehealth: Payer: Self-pay | Admitting: Internal Medicine

## 2022-10-27 NOTE — Telephone Encounter (Signed)
Called patient regarding upcoming March appointment, patient is notified.

## 2022-10-29 ENCOUNTER — Other Ambulatory Visit (HOSPITAL_COMMUNITY): Payer: Self-pay

## 2022-10-29 ENCOUNTER — Other Ambulatory Visit: Payer: Self-pay

## 2022-11-03 ENCOUNTER — Inpatient Hospital Stay: Payer: Medicare Other | Attending: Neurosurgery

## 2022-11-03 ENCOUNTER — Telehealth: Payer: Self-pay | Admitting: Emergency Medicine

## 2022-11-03 ENCOUNTER — Inpatient Hospital Stay (HOSPITAL_BASED_OUTPATIENT_CLINIC_OR_DEPARTMENT_OTHER): Payer: Medicare Other | Admitting: Internal Medicine

## 2022-11-03 VITALS — BP 144/72 | HR 85 | Temp 97.3°F | Resp 15 | Wt 160.8 lb

## 2022-11-03 DIAGNOSIS — C711 Malignant neoplasm of frontal lobe: Secondary | ICD-10-CM | POA: Insufficient documentation

## 2022-11-03 DIAGNOSIS — Z7952 Long term (current) use of systemic steroids: Secondary | ICD-10-CM | POA: Insufficient documentation

## 2022-11-03 DIAGNOSIS — Z923 Personal history of irradiation: Secondary | ICD-10-CM | POA: Insufficient documentation

## 2022-11-03 DIAGNOSIS — G4700 Insomnia, unspecified: Secondary | ICD-10-CM | POA: Insufficient documentation

## 2022-11-03 DIAGNOSIS — Z79899 Other long term (current) drug therapy: Secondary | ICD-10-CM | POA: Insufficient documentation

## 2022-11-03 LAB — CBC WITH DIFFERENTIAL (CANCER CENTER ONLY)
Abs Immature Granulocytes: 0.06 10*3/uL (ref 0.00–0.07)
Basophils Absolute: 0 10*3/uL (ref 0.0–0.1)
Basophils Relative: 0 %
Eosinophils Absolute: 0.2 10*3/uL (ref 0.0–0.5)
Eosinophils Relative: 2 %
HCT: 37.3 % (ref 36.0–46.0)
Hemoglobin: 13.2 g/dL (ref 12.0–15.0)
Immature Granulocytes: 1 %
Lymphocytes Relative: 9 %
Lymphs Abs: 0.7 10*3/uL (ref 0.7–4.0)
MCH: 34.3 pg — ABNORMAL HIGH (ref 26.0–34.0)
MCHC: 35.4 g/dL (ref 30.0–36.0)
MCV: 96.9 fL (ref 80.0–100.0)
Monocytes Absolute: 0.6 10*3/uL (ref 0.1–1.0)
Monocytes Relative: 9 %
Neutro Abs: 5.7 10*3/uL (ref 1.7–7.7)
Neutrophils Relative %: 79 %
Platelet Count: 126 10*3/uL — ABNORMAL LOW (ref 150–400)
RBC: 3.85 MIL/uL — ABNORMAL LOW (ref 3.87–5.11)
RDW: 13.9 % (ref 11.5–15.5)
WBC Count: 7.2 10*3/uL (ref 4.0–10.5)
nRBC: 0 % (ref 0.0–0.2)

## 2022-11-03 LAB — CMP (CANCER CENTER ONLY)
ALT: 26 U/L (ref 0–44)
AST: 22 U/L (ref 15–41)
Albumin: 4.4 g/dL (ref 3.5–5.0)
Alkaline Phosphatase: 68 U/L (ref 38–126)
Anion gap: 5 (ref 5–15)
BUN: 26 mg/dL — ABNORMAL HIGH (ref 8–23)
CO2: 25 mmol/L (ref 22–32)
Calcium: 9.5 mg/dL (ref 8.9–10.3)
Chloride: 111 mmol/L (ref 98–111)
Creatinine: 1.13 mg/dL — ABNORMAL HIGH (ref 0.44–1.00)
GFR, Estimated: 54 mL/min — ABNORMAL LOW (ref >60)
Glucose, Bld: 103 mg/dL — ABNORMAL HIGH (ref 70–99)
Potassium: 4.2 mmol/L (ref 3.5–5.1)
Sodium: 141 mmol/L (ref 135–145)
Total Bilirubin: 0.6 mg/dL (ref 0.3–1.2)
Total Protein: 6.8 g/dL (ref 6.5–8.1)

## 2022-11-03 NOTE — Telephone Encounter (Signed)
[id number] A Single Arm, Pilot Study of Ramipril for Preventing Radiation-Induced Cognitive Decline in Glioblastoma (GBM) Patients Receiving Brain Radiotherapy  5 month post RT call  Contacted patient today (11/03/22) via phone for 5 month post RT visit.  Verified patient identity with two patient identifiers and ensured privacy.    CONCOMITANT MEDICATIONS: Concomitant medications reviewed. Patient verified that current medication list is correct; denied any new medications. Oxybutynin stopped today. Patient has completed her 3rd cycle of Temodar (marked as completed today on med list) and will now start her 4th cycle.   PROVIDER VISIT             Patient was seen by Dr. Mickeal Skinner today for a [facility] visit.  See provider note.   ADVERSE EVENTS: CTCAE V. 5.0 Adverse events assessed per protocol. Ongoing mild alopecia (grade 1) and ongoing fatigue (grade 1) relieved by rest, both unchanged.  Hypertension ongoing at Grade 2 (baseline Grade 1).  Intermittent mild nausea (Grade 1) when taking temodar, patient taking nausea medication as needed. Per today's [facility] labs: Creatinine increase is now Grade 2, no intervention.  Platelets decreased (Grade 1) and hyperglycemia (Grade 1) with no interventions.  All other abnormal labs ruled clinically insignificant per MD. All AE's unrelated to study drug per MD, no action or reporting required.   ADVERSE EVENT LOG: Study/Protocol: [id number]  5 Month Post RT   Baseline: hypertension (grade 1); insomnia (grade 2).   Event Grade CTCAE v5.0 Onset Date Resolved Date Attribution to Study Treatment Treatment Comments  Alopecia 1 05/25/22 Ongoing Unrelated None     Fatigue 1 06/13/22 Ongoing Unrelated None Relieved by rest  Hypertension 2 10/06/22 Ongoing Unrelated None Grade 1 at baseline  Creatinine increased 1 04/12/22 11/03/2022 Unrelated None Pre-study/ baseline condition  Nausea 1 11/03/22 Ongoing Unrelated None Related to temodar per MD  Creatinine increased 2 11/03/22  Ongoing Unrelated None   Platelets decreased 1 11/03/22 Ongoing Unrelated None   Hyperglycemia 1 11/03/22 Ongoing Unrelated None     PLAN:  Patient has completed study requirements, is now off study.  Patient thanked for her time and voluntary participation in this study. Patient has been provided direct contact information and is encouraged to contact this nurse for any questions or concerns.  Wells Guiles 'Learta Codding' Neysa Bonito, RN, BSN, [facility] Clinical Research Nurse I 11/03/22 2:36 PM

## 2022-11-03 NOTE — Progress Notes (Signed)
[facility] at [address], [place] [address] [phone]   Interval Evaluation  Date of Service: 11/03/22 [name] Patient MRN: [id number] Patient DOB: 08-31-1955 Provider: Ventura Sellers, MD  Identifying Statement:  Jennifer [name] is a 67 y.o. female with left frontal glioblastoma   Oncologic History: Oncology History  Glioblastoma multiforme of frontal lobe ([facility])  04/11/2022 Surgery   Craniotomy, resection of left frontal mass with Dr. Christella Noa; path is glioblastoma IDHwt.   05/12/2022 -  Chemotherapy   Patient is on Treatment Plan : BRAIN GLIOBLASTOMA Radiation Therapy With Concurrent Temozolomide 75 mg/m2 Daily Followed By Sequential Maintenance Temozolomide x 6-12 cycles       Biomarkers:  MGMT Unknown.  IDH 1/2 Wild type.  EGFR Unknown  TERT Unknown   Interval History: Jennifer [name] presents today for follow up, now having completed cycle #3 of Temodar, recent labs.  She did better with nausea this month, only had issue on first night of five.  No new or progressive neurologic changes reported compared to prior visit.  Headaches remain improved.  Denies severe headaches or seizures.    H+P (05/05/22) Patient presented to neurologic attention in mid-August 2023 with new onset difficulty speaking, confusion noticed by family.  She and her family described sudden onset speech impairment and confusion in the afternoon of the 16th, after having been seen in normal state of health that morning.  CNS imaging demonstrated an enhancing left frontal mass, which was resected by Dr. Christella Noa on 04/11/22.  Following surgery she had some speech difficulty, but improved quickly through rehab admission.  At present, she is fully independent with gait, speech, ADLs.  She is off decadron and Keppra.  Retired, had worked at [employer] in [employer].  Today presents with son and daughter in law.    Medications: Current Outpatient  Medications on File Prior to Visit  Medication Sig Dispense Refill   amLODipine (NORVASC) 5 MG tablet TAKE 1 TABLET BY MOUTH EVERY DAY 90 tablet 1   atorvastatin (LIPITOR) 10 MG tablet TAKE 1 TABLET BY MOUTH EVERY DAY (Patient taking differently: Take 10 mg by mouth daily.) 90 tablet 1   busPIRone (BUSPAR) 5 MG tablet TAKE 1 TABLET BY MOUTH THREE TIMES A DAY (Patient taking differently: Take 5 mg by mouth 3 (three) times daily.) 270 tablet 1   fluticasone (FLONASE) 50 MCG/ACT nasal spray SPRAY 2 SPRAYS INTO EACH NOSTRIL EVERY DAY (Patient taking differently: Place 2 sprays into both nostrils daily.) 48 mL 0   losartan (COZAAR) 50 MG tablet TAKE 1 TABLET BY MOUTH EVERY DAY 90 tablet 1   meloxicam (MOBIC) 7.5 MG tablet Take 1 tablet by mouth 2 (two) times daily.     ondansetron (ZOFRAN) 8 MG tablet Take 1 tablet (8 mg total) by mouth every 8 (eight) hours as needed for nausea or vomiting. May take 30-60 minutes prior to Temodar administration if nausea/vomiting occurs as needed. 30 tablet 1   prochlorperazine (COMPAZINE) 10 MG tablet Take 1 tablet (10 mg total) by mouth every 6 (six) hours as needed for nausea or vomiting. 30 tablet 0   SUMAtriptan (IMITREX) 50 MG tablet Take 50 mg by mouth every 2 (two) hours as needed for migraine. May repeat in 2 hours if headache persists or recurs.     topiramate (TOPAMAX) 50 MG tablet TAKE 1 TABLET BY MOUTH TWICE A DAY (Patient taking differently: Take 50 mg by mouth 2 (  two) times daily.) 60 tablet 0   traMADol (ULTRAM) 50 MG tablet Take 50 mg by mouth every 8 (eight) hours as needed.     traZODone (DESYREL) 50 MG tablet Take 1 tablet (50 mg total) by mouth at bedtime as needed for sleep. 30 tablet 2   dexamethasone (DECADRON) 2 MG tablet Take 1 tablet (2 mg total) by mouth daily. (Patient not taking: Reported on 11/03/2022) 5 tablet 0   No current facility-administered medications on file prior to visit.    Allergies: No Known Allergies Past Medical History:   Past Medical History:  Diagnosis Date   Hyperlipidemia    Hypertension    Migraine    Past Surgical History:  Past Surgical History:  Procedure Laterality Date   APPENDECTOMY     APPLICATION OF CRANIAL NAVIGATION N/A 04/11/2022   Procedure: APPLICATION OF CRANIAL NAVIGATION;  Surgeon: Ashok Pall, MD;  Location: [facility];  Service: Neurosurgery;  Laterality: N/A;   CRANIOTOMY Left 04/11/2022   Procedure: LEFT FRONTAL CRANIOTOMY FOR RESECTION OF TUMOR WITH Lucky Rathke;  Surgeon: Ashok Pall, MD;  Location: [facility];  Service: Neurosurgery;  Laterality: Left;  RM 21   LAPAROSCOPIC APPENDECTOMY  07/15/2012   Procedure: APPENDECTOMY LAPAROSCOPIC;  Surgeon: Zenovia Jarred, MD;  Location: [facility];  Service: General;  Laterality: N/A;  Laparoscopic Appendectomy   Social History:  Social History   Socioeconomic History   Marital status: Widowed    Spouse name: Not on file   Number of children: 2   Years of education: Not on file   Highest education level: Not on file  Occupational History   Occupation: retail    Comment: [employer] in [place], [place]  Tobacco Use   Smoking status: Never   Smokeless tobacco: Never  Substance and Sexual Activity   Alcohol use: Yes    Comment: 2 drinks once every 2-3 months   Drug use: No   Sexual activity: Not on file  Other Topics Concern   Not on file  Social History Narrative   Marital status: widowed since 2008; husband was bipolar and alcoholic      Children: 2 children (28, 52); son lives with patient.Daughter lives in [place]/autism/does not drive.  Daughter lives with mother-in-law.  One grandpuppy      Lives: with son      Employment: works at [employer]; also works in [place].      Tobacco: none      Alcohol: none; husband was an alcoholic.      Drugs:   none      Exercise:   Social Determinants of Health   Financial Resource Strain: Not on file  Food Insecurity: Not on file  Transportation Needs: Not on file   Physical Activity: Not on file  Stress: Not on file  Social Connections: Not on file  Intimate Partner Violence: Not on file   Family History:  Family History  Adopted: Yes    Review of Systems: Constitutional: Doesn't report fevers, chills or abnormal weight loss Eyes: Doesn't report blurriness of vision Ears, nose, mouth, throat, and face: Doesn't report sore throat Respiratory: Doesn't report cough, dyspnea or wheezes Cardiovascular: Doesn't report palpitation, chest discomfort  Gastrointestinal:  Doesn't report nausea, constipation, diarrhea GU: Doesn't report incontinence Skin: Doesn't report skin rashes Neurological: Per HPI Musculoskeletal: Doesn't report joint pain Behavioral/Psych: Doesn't report anxiety  Physical Exam: Vitals:   11/03/22 1053  BP: (!) 144/72  Pulse: 85  Resp: 15  Temp: (!) 97.3 F (  36.3 C)  SpO2: 100%   KPS: 80 ECOG: 1 General: Alert, cooperative, pleasant, in no acute distress Head: Normal EENT: No conjunctival injection or scleral icterus.  Lungs: Resp effort normal Cardiac: Regular rate Abdomen: Non-distended abdomen Skin: No rashes cyanosis or petechiae. Extremities: No clubbing or edema  Neurologic Exam: Mental Status: Awake, alert, attentive to examiner. Oriented to self and environment. Language is fluent with intact comprehension.  Pseudobulbar elements. Cranial Nerves: Visual acuity is grossly normal. Visual fields are full. Extra-ocular movements intact. No ptosis. Face is symmetric Motor: Tone and bulk are normal. Power is full in both arms and legs. Reflexes are symmetric, no pathologic reflexes present.  Sensory: Intact to light touch Gait: Normal.   Labs: I have reviewed the data as listed    Component Value Date/Time   NA 141 11/03/2022 1024   NA 140 10/04/2018 1703   K 4.2 11/03/2022 1024   CL 111 11/03/2022 1024   CO2 25 11/03/2022 1024   GLUCOSE 103 (H) 11/03/2022 1024   BUN 26 (H) 11/03/2022 1024   BUN 24  10/04/2018 1703   CREATININE 1.13 (H) 11/03/2022 1024   CREATININE 1.06 (H) 01/29/2016 1518   CALCIUM 9.5 11/03/2022 1024   PROT 6.8 11/03/2022 1024   PROT 6.8 10/04/2018 1703   ALBUMIN 4.4 11/03/2022 1024   ALBUMIN 4.6 10/04/2018 1703   AST 22 11/03/2022 1024   ALT 26 11/03/2022 1024   ALKPHOS 68 11/03/2022 1024   BILITOT 0.6 11/03/2022 1024   GFRNONAA 54 (L) 11/03/2022 1024   GFRNONAA 58 (L) 01/29/2016 1518   GFRAA 70 10/04/2018 1703   GFRAA 67 01/29/2016 1518   Lab Results  Component Value Date   WBC 7.2 11/03/2022   NEUTROABS 5.7 11/03/2022   HGB 13.2 11/03/2022   HCT 37.3 11/03/2022   MCV 96.9 11/03/2022   PLT 126 (L) 11/03/2022     Assessment/Plan Glioblastoma multiforme of frontal lobe (HCC) - Plan: MR BRAIN W WO CONTRAST  Jennifer [name] is clinically stable today, now having completed cycle #3 of 5-day Temodar.    Labs are within normal limits today.  We recommended continuing treatment with cycle #4 Temozolomide, dose reduced to '150mg'$ /m2, on for five days and off for twenty three days in twenty eight day cycles. The patient will have a complete blood count performed on days 21 and 28 of each cycle, and a comprehensive metabolic panel performed on day 28 of each cycle. Labs may need to be performed more often. Zofran will prescribed for home use for nausea/vomiting.   Chemotherapy should be held for the following:  ANC less than 1,000  Platelets less than 100,000  LFT or creatinine greater than 2x ULN  If clinical concerns/contraindications develop  We recommended she continue Topamax '50mg'$  BID for migraine and seizure prevention.  May con't Trazodone '50mg'$  HS for insomnia.  Ok with decadron '2mg'$  daily x5 days to supplement Compazine/Zofarn regimen for nausea.  If dose reduction ineffective, will consider zyprexa.  We ask that Jacquelin Hawking return to clinic in 1 months with MRI brain for evaluation prior to cycle #5, or sooner as needed.  All questions  were answered. The patient knows to call the clinic with any problems, questions or concerns. No barriers to learning were detected.  The total time spent in the encounter was 30 minutes and more than 50% was on counseling and review of test results   Ventura Sellers, MD Medical Director of Neuro-Oncology [facility] 11/03/22 11:52 AM

## 2022-11-04 ENCOUNTER — Telehealth: Payer: Self-pay | Admitting: Physician Assistant

## 2022-11-04 NOTE — Telephone Encounter (Signed)
Per 3/11 LOS reached out to patient to schedule, patient aware of date and time of appointments.

## 2022-11-20 ENCOUNTER — Other Ambulatory Visit (HOSPITAL_COMMUNITY): Payer: Self-pay

## 2022-11-21 ENCOUNTER — Other Ambulatory Visit (HOSPITAL_COMMUNITY): Payer: Self-pay

## 2022-11-25 ENCOUNTER — Other Ambulatory Visit: Payer: Self-pay

## 2022-11-27 ENCOUNTER — Ambulatory Visit (HOSPITAL_COMMUNITY)
Admission: RE | Admit: 2022-11-27 | Discharge: 2022-11-27 | Disposition: A | Discharge status: Home / Self Care | Payer: Medicare Other | Source: Ambulatory Visit | Attending: Internal Medicine | Admitting: Internal Medicine

## 2022-11-27 DIAGNOSIS — C711 Malignant neoplasm of frontal lobe: Secondary | ICD-10-CM | POA: Diagnosis present

## 2022-11-27 MED ORDER — GADOBUTROL 1 MMOL/ML IV SOLN
7.0000 mL | Freq: Once | INTRAVENOUS | Status: AC | PRN
  Administered 2022-11-27: 7 mL via INTRAVENOUS

## 2022-11-28 ENCOUNTER — Encounter: Payer: Self-pay | Admitting: Internal Medicine

## 2022-12-01 ENCOUNTER — Other Ambulatory Visit: Payer: Self-pay

## 2022-12-01 ENCOUNTER — Inpatient Hospital Stay: Payer: Medicare Other | Attending: Neurosurgery

## 2022-12-01 ENCOUNTER — Inpatient Hospital Stay (HOSPITAL_BASED_OUTPATIENT_CLINIC_OR_DEPARTMENT_OTHER): Payer: Medicare Other | Admitting: Internal Medicine

## 2022-12-01 ENCOUNTER — Other Ambulatory Visit: Payer: Self-pay | Admitting: Internal Medicine

## 2022-12-01 ENCOUNTER — Other Ambulatory Visit (HOSPITAL_COMMUNITY): Payer: Self-pay

## 2022-12-01 VITALS — BP 144/79 | HR 99 | Temp 97.7°F | Resp 20 | Wt 161.3 lb

## 2022-12-01 DIAGNOSIS — Z79899 Other long term (current) drug therapy: Secondary | ICD-10-CM | POA: Insufficient documentation

## 2022-12-01 DIAGNOSIS — C711 Malignant neoplasm of frontal lobe: Secondary | ICD-10-CM

## 2022-12-01 LAB — CMP (CANCER CENTER ONLY)
ALT: 18 U/L (ref 0–44)
AST: 19 U/L (ref 15–41)
Albumin: 4.3 g/dL (ref 3.5–5.0)
Alkaline Phosphatase: 61 U/L (ref 38–126)
Anion gap: 5 (ref 5–15)
BUN: 26 mg/dL — ABNORMAL HIGH (ref 8–23)
CO2: 25 mmol/L (ref 22–32)
Calcium: 9.5 mg/dL (ref 8.9–10.3)
Chloride: 112 mmol/L — ABNORMAL HIGH (ref 98–111)
Creatinine: 1.19 mg/dL — ABNORMAL HIGH (ref 0.44–1.00)
GFR, Estimated: 51 mL/min — ABNORMAL LOW (ref >60)
Glucose, Bld: 91 mg/dL (ref 70–99)
Potassium: 4.4 mmol/L (ref 3.5–5.1)
Sodium: 142 mmol/L (ref 135–145)
Total Bilirubin: 0.5 mg/dL (ref 0.3–1.2)
Total Protein: 6.8 g/dL (ref 6.5–8.1)

## 2022-12-01 LAB — CBC WITH DIFFERENTIAL (CANCER CENTER ONLY)
Abs Immature Granulocytes: 0.03 10*3/uL (ref 0.00–0.07)
Basophils Absolute: 0 10*3/uL (ref 0.0–0.1)
Basophils Relative: 1 %
Eosinophils Absolute: 0.1 10*3/uL (ref 0.0–0.5)
Eosinophils Relative: 2 %
HCT: 38.2 % (ref 36.0–46.0)
Hemoglobin: 13.2 g/dL (ref 12.0–15.0)
Immature Granulocytes: 1 %
Lymphocytes Relative: 10 %
Lymphs Abs: 0.6 10*3/uL — ABNORMAL LOW (ref 0.7–4.0)
MCH: 34.6 pg — ABNORMAL HIGH (ref 26.0–34.0)
MCHC: 34.6 g/dL (ref 30.0–36.0)
MCV: 100 fL (ref 80.0–100.0)
Monocytes Absolute: 0.4 10*3/uL (ref 0.1–1.0)
Monocytes Relative: 8 %
Neutro Abs: 4.5 10*3/uL (ref 1.7–7.7)
Neutrophils Relative %: 78 %
Platelet Count: 158 10*3/uL (ref 150–400)
RBC: 3.82 MIL/uL — ABNORMAL LOW (ref 3.87–5.11)
RDW: 13.2 % (ref 11.5–15.5)
WBC Count: 5.7 10*3/uL (ref 4.0–10.5)
nRBC: 0 % (ref 0.0–0.2)

## 2022-12-01 MED ORDER — TEMOZOLOMIDE 250 MG PO CAPS
150.0000 mg/m2/d | ORAL_CAPSULE | Freq: Every day | ORAL | 0 refills | Status: DC
  Filled 2022-12-01: qty 5, 5d supply, fill #0

## 2022-12-01 MED ORDER — DEXAMETHASONE 2 MG PO TABS
2.0000 mg | ORAL_TABLET | Freq: Every day | ORAL | 0 refills | Status: DC
  Filled 2022-12-01: qty 5, 5d supply, fill #0

## 2022-12-01 MED ORDER — PROCHLORPERAZINE MALEATE 10 MG PO TABS
10.0000 mg | ORAL_TABLET | Freq: Four times a day (QID) | ORAL | 0 refills | Status: DC | PRN
Start: 2022-12-01 — End: 2022-12-30
  Filled 2022-12-01: qty 30, 8d supply, fill #0

## 2022-12-01 NOTE — Progress Notes (Signed)
[address] [facility] at [facility] [address]  [place], [place] [address] [phone]   Interval Evaluation  Date of Service: 12/01/22 [name] Patient MRN: [id number] Patient DOB: [birth year] Provider: Henreitta Leber, MD  Identifying Statement:  [name] is a 67 y.o. female with left frontal glioblastoma   Oncologic History: Oncology History  Glioblastoma multiforme of frontal lobe  04/11/2022 Surgery   Craniotomy, resection of left frontal mass with Dr. Franky Macho; path is glioblastoma IDHwt.   05/12/2022 -  Chemotherapy   Patient is on Treatment Plan : BRAIN GLIOBLASTOMA Radiation Therapy With Concurrent Temozolomide 75 mg/m2 Daily Followed By Sequential Maintenance Temozolomide x 6-12 cycles       Biomarkers:  MGMT Unknown.  IDH 1/2 Wild type.  EGFR Unknown  TERT Unknown   Interval History: Jennifer [name] presents today for follow up, now having completed cycle #4 of Temodar and MRI study.  Few issues with nausea this month.  No new or progressive neurologic changes reported compared to prior visit.  She does describe increased fatigue, and some lightheaded feeling since resuming her two BP meds.  Headaches remain improved.  Denies severe headaches or seizures.    H+P (05/05/22) Patient presented to neurologic attention in mid-August 2023 with new onset difficulty speaking, confusion noticed by family.  She and her family described sudden onset speech impairment and confusion in the afternoon of the 16th, after having been seen in normal state of health that morning.  CNS imaging demonstrated an enhancing left frontal mass, which was resected by Dr. Franky Macho on 04/11/22.  Following surgery she had some speech difficulty, but improved quickly through rehab admission.  At present, she is fully independent with gait, speech, ADLs.  She is off decadron and Keppra.  Retired, had worked at [employer] in [employer].  Today presents with son and  daughter in law.    Medications: Current Outpatient Medications on File Prior to Visit  Medication Sig Dispense Refill   amLODipine (NORVASC) 5 MG tablet TAKE 1 TABLET BY MOUTH EVERY DAY 90 tablet 1   atorvastatin (LIPITOR) 10 MG tablet TAKE 1 TABLET BY MOUTH EVERY DAY (Patient taking differently: Take 10 mg by mouth daily.) 90 tablet 1   busPIRone (BUSPAR) 5 MG tablet TAKE 1 TABLET BY MOUTH THREE TIMES A DAY (Patient taking differently: Take 5 mg by mouth 3 (three) times daily.) 270 tablet 1   fluticasone (FLONASE) 50 MCG/ACT nasal spray SPRAY 2 SPRAYS INTO EACH NOSTRIL EVERY DAY (Patient taking differently: Place 2 sprays into both nostrils daily.) 48 mL 0   losartan (COZAAR) 50 MG tablet TAKE 1 TABLET BY MOUTH EVERY DAY 90 tablet 1   meloxicam (MOBIC) 7.5 MG tablet Take 1 tablet by mouth 2 (two) times daily.     ondansetron (ZOFRAN) 8 MG tablet Take 1 tablet (8 mg total) by mouth every 8 (eight) hours as needed for nausea or vomiting. May take 30-60 minutes prior to Temodar administration if nausea/vomiting occurs as needed. 30 tablet 1   prochlorperazine (COMPAZINE) 10 MG tablet Take 1 tablet (10 mg total) by mouth every 6 (six) hours as needed for nausea or vomiting. 30 tablet 0   SUMAtriptan (IMITREX) 50 MG tablet Take 50 mg by mouth every 2 (two) hours as needed for migraine. May repeat in 2 hours if headache persists or recurs.     topiramate (TOPAMAX) 50 MG tablet TAKE 1 TABLET BY MOUTH TWICE A DAY (Patient taking  differently: Take 50 mg by mouth 2 (two) times daily.) 60 tablet 0   traMADol (ULTRAM) 50 MG tablet Take 50 mg by mouth every 8 (eight) hours as needed.     traZODone (DESYREL) 50 MG tablet Take 1 tablet (50 mg total) by mouth at bedtime as needed for sleep. 30 tablet 2   dexamethasone (DECADRON) 2 MG tablet Take 1 tablet (2 mg total) by mouth daily. (Patient not taking: Reported on 12/01/2022) 5 tablet 0   No current facility-administered medications on file prior to visit.     Allergies: No Known Allergies Past Medical History:  Past Medical History:  Diagnosis Date   Hyperlipidemia    Hypertension    Migraine    Past Surgical History:  Past Surgical History:  Procedure Laterality Date   APPENDECTOMY     APPLICATION OF CRANIAL NAVIGATION N/A 04/11/2022   Procedure: APPLICATION OF CRANIAL NAVIGATION;  Surgeon: Coletta Memosabbell, Kyle, MD;  Location: [facility] OR;  Service: Neurosurgery;  Laterality: N/A;   CRANIOTOMY Left 04/11/2022   Procedure: LEFT FRONTAL CRANIOTOMY FOR RESECTION OF TUMOR WITH Normajean GlasgowBRAINLAB;  Surgeon: Coletta Memosabbell, Kyle, MD;  Location: [facility] OR;  Service: Neurosurgery;  Laterality: Left;  RM 21   LAPAROSCOPIC APPENDECTOMY  07/15/2012   Procedure: APPENDECTOMY LAPAROSCOPIC;  Surgeon: Liz MaladyBurke E Thompson, MD;  Location: [facility] OR;  Service: General;  Laterality: N/A;  Laparoscopic Appendectomy   Social History:  Social History   Socioeconomic History   Marital status: Widowed    Spouse name: Not on file   Number of children: 2   Years of education: Not on file   Highest education level: Not on file  Occupational History   Occupation: retail    Comment: [employer] in [place], [place]  Tobacco Use   Smoking status: Never   Smokeless tobacco: Never  Substance and Sexual Activity   Alcohol use: Yes    Comment: 2 drinks once every 2-3 months   Drug use: No   Sexual activity: Not on file  Other Topics Concern   Not on file  Social History Narrative   Marital status: widowed since 2008; husband was bipolar and alcoholic      Children: 2 children (28, [id number]); son lives with patient.Daughter lives in [place]/autism/does not drive.  Daughter lives with mother-in-law.  One grandpuppy      Lives: with son      Employment: works at [employer]; also works in [place] business.      Tobacco: none      Alcohol: none; husband was an alcoholic.      Drugs:   none      Exercise:   Social Determinants of Health   Financial Resource Strain: Not on file  Food  Insecurity: Not on file  Transportation Needs: Not on file  Physical Activity: Not on file  Stress: Not on file  Social Connections: Not on file  Intimate Partner Violence: Not on file   Family History:  Family History  Adopted: Yes    Review of Systems: Constitutional: Doesn't report fevers, chills or abnormal weight loss Eyes: Doesn't report blurriness of vision Ears, nose, mouth, throat, and face: Doesn't report sore throat Respiratory: Doesn't report cough, dyspnea or wheezes Cardiovascular: Doesn't report palpitation, chest discomfort  Gastrointestinal:  Doesn't report nausea, constipation, diarrhea GU: Doesn't report incontinence Skin: Doesn't report skin rashes Neurological: Per HPI Musculoskeletal: Doesn't report joint pain Behavioral/Psych: Doesn't report anxiety  Physical Exam: Vitals:   12/01/22 1110  BP: (!) 144/79  Pulse: 99  Resp: 20  Temp: 97.7 F (36.5 C)  SpO2: 99%   KPS: 80 ECOG: 1 General: Alert, cooperative, pleasant, in no acute distress Head: Normal EENT: No conjunctival injection or scleral icterus.  Lungs: Resp effort normal Cardiac: Regular rate Abdomen: Non-distended abdomen Skin: No rashes cyanosis or petechiae. Extremities: No clubbing or edema  Neurologic Exam: Mental Status: Awake, alert, attentive to examiner. Oriented to self and environment. Language is fluent with intact comprehension.  Pseudobulbar elements. Cranial Nerves: Visual acuity is grossly normal. Visual fields are full. Extra-ocular movements intact. No ptosis. Face is symmetric Motor: Tone and bulk are normal. Power is full in both arms and legs. Reflexes are symmetric, no pathologic reflexes present.  Sensory: Intact to light touch Gait: Normal.   Labs: I have reviewed the data as listed    Component Value Date/Time   NA 142 12/01/2022 1048   NA 140 10/04/2018 1703   K 4.4 12/01/2022 1048   CL 112 (H) 12/01/2022 1048   CO2 25 12/01/2022 1048   GLUCOSE 91  12/01/2022 1048   BUN 26 (H) 12/01/2022 1048   BUN 24 10/04/2018 1703   CREATININE 1.19 (H) 12/01/2022 1048   CREATININE 1.06 (H) 01/29/2016 1518   CALCIUM 9.5 12/01/2022 1048   PROT 6.8 12/01/2022 1048   PROT 6.8 10/04/2018 1703   ALBUMIN 4.3 12/01/2022 1048   ALBUMIN 4.6 10/04/2018 1703   AST 19 12/01/2022 1048   ALT 18 12/01/2022 1048   ALKPHOS 61 12/01/2022 1048   BILITOT 0.5 12/01/2022 1048   GFRNONAA 51 (L) 12/01/2022 1048   GFRNONAA 58 (L) 01/29/2016 1518   GFRAA 70 10/04/2018 1703   GFRAA 67 01/29/2016 1518   Lab Results  Component Value Date   WBC 5.7 12/01/2022   NEUTROABS 4.5 12/01/2022   HGB 13.2 12/01/2022   HCT 38.2 12/01/2022   MCV 100.0 12/01/2022   PLT 158 12/01/2022    Imaging:  [facility] Clinician Interpretation: I have personally reviewed the CNS images as listed.  My interpretation, in the context of the patient's clinical presentation, is stable disease  MR BRAIN W WO CONTRAST  Result Date: 12/01/2022 CLINICAL DATA:  Frontal lobe GBM EXAM: MRI HEAD WITHOUT AND WITH CONTRAST TECHNIQUE: Multiplanar, multiecho pulse sequences of the brain and surrounding structures were obtained without and with intravenous contrast. CONTRAST:  64mL GADAVIST GADOBUTROL 1 MMOL/ML IV SOLN COMPARISON:  Brain MRI 09/18/2022 FINDINGS: Brain: Postsurgical changes reflecting left frontal craniotomy for mass resection are again seen. A small extra-axial collection underlying the craniotomy is stable to decreased in size, measuring 3 mm in maximal thickness without significant mass effect on the underlying brain parenchyma. A small amount of postsurgical blood products are again seen about the resection site. Nodular enhancement at the periphery of the resection site is overall not significantly changed since the prior study. Previously seen focal FLAIR signal abnormality in the left centrum semiovale is more confluent in appearance and appears contiguous with the more extensive FLAIR signal  abnormality surrounding the resection site (9-34 on the current study compared to image 9-36 on the prior study). Surrounding FLAIR signal abnormality is otherwise not significantly changed. There is no new or worsening mass effect and no midline shift. There is no acute intracranial hemorrhage, new/acute extra-axial fluid collection, or acute infarct. Parenchymal volume is stable. The ventricles are stable in size, with unchanged slight ex vacuo dilatation of the left lateral ventricle. The pituitary and suprasellar region are normal. Vascular: Normal flow voids. Skull and upper  cervical spine: Postsurgical changes as above. There is no suspicious marrow signal abnormality. Sinuses/Orbits: There is mild mucosal thickening in the left maxillary sinus. The globes and orbits are unremarkable. Other: None. IMPRESSION: Since 09/18/2022: 1. Slightly increased FLAIR signal abnormality along the posterosuperior margin of the resection cavity. 2. Stable enhancement around the resection cavity with no new or worsening mass effect. Electronically Signed   By: Lesia Hausen M.D.   On: 12/01/2022 08:37     Assessment/Plan Glioblastoma multiforme of frontal lobe  SIDONIA NUTTER is clinically stable today, now having completed cycle #4 of 5-day Temodar.  MRI brain demonstrates mostly stable findings, some increase in T2/FLAIR more consistent with treatment effect.    Labs are within normal limits today.  We recommended continuing treatment with cycle #5 Temozolomide, dose reduced to 150mg /m2, on for five days and off for twenty three days in twenty eight day cycles. The patient will have a complete blood count performed on days 21 and 28 of each cycle, and a comprehensive metabolic panel performed on day 28 of each cycle. Labs may need to be performed more often. Zofran will prescribed for home use for nausea/vomiting.   Chemotherapy should be held for the following:  ANC less than 1,000  Platelets less than  100,000  LFT or creatinine greater than 2x ULN  If clinical concerns/contraindications develop  We recommended she continue Topamax 50mg  BID for migraine and seizure prevention.  May con't Trazodone 50mg  HS for insomnia.  Ok with decadron 2mg  daily x5 days to supplement Compazine/Zofarn regimen for nausea.    We ask that MARYETTA SHAFER return to clinic in 1 months with labs for evaluation prior to cycle #6, or sooner as needed.  All questions were answered. The patient knows to call the clinic with any problems, questions or concerns. No barriers to learning were detected.  The total time spent in the encounter was 40 minutes and more than 50% was on counseling and review of test results   Henreitta Leber, MD Medical Director of Neuro-Oncology [facility] at [facility] 12/01/22 11:42 AM

## 2022-12-03 ENCOUNTER — Other Ambulatory Visit: Payer: Self-pay

## 2022-12-18 ENCOUNTER — Other Ambulatory Visit (HOSPITAL_COMMUNITY): Payer: Self-pay

## 2022-12-24 ENCOUNTER — Telehealth: Payer: Self-pay | Admitting: Internal Medicine

## 2022-12-24 NOTE — Telephone Encounter (Signed)
Called patient regarding upcoming May appointments, patient is notified.  ?

## 2022-12-29 ENCOUNTER — Other Ambulatory Visit: Payer: Self-pay | Admitting: Internal Medicine

## 2022-12-29 ENCOUNTER — Other Ambulatory Visit (HOSPITAL_COMMUNITY): Payer: Self-pay

## 2022-12-29 ENCOUNTER — Other Ambulatory Visit: Payer: Self-pay

## 2022-12-29 DIAGNOSIS — C711 Malignant neoplasm of frontal lobe: Secondary | ICD-10-CM

## 2022-12-29 NOTE — Telephone Encounter (Signed)
Patient will see provider on 12/30/2022 and refills will be determined after labs on that day.

## 2022-12-30 ENCOUNTER — Inpatient Hospital Stay: Payer: Medicare Other | Attending: Neurosurgery

## 2022-12-30 ENCOUNTER — Inpatient Hospital Stay (HOSPITAL_BASED_OUTPATIENT_CLINIC_OR_DEPARTMENT_OTHER): Payer: Medicare Other | Admitting: Internal Medicine

## 2022-12-30 ENCOUNTER — Other Ambulatory Visit (HOSPITAL_COMMUNITY): Payer: Self-pay

## 2022-12-30 ENCOUNTER — Other Ambulatory Visit: Payer: Self-pay | Admitting: Internal Medicine

## 2022-12-30 ENCOUNTER — Other Ambulatory Visit: Payer: Self-pay

## 2022-12-30 VITALS — BP 142/67 | HR 93 | Temp 97.7°F | Resp 16 | Wt 163.2 lb

## 2022-12-30 DIAGNOSIS — Z7952 Long term (current) use of systemic steroids: Secondary | ICD-10-CM | POA: Diagnosis not present

## 2022-12-30 DIAGNOSIS — G4700 Insomnia, unspecified: Secondary | ICD-10-CM | POA: Diagnosis not present

## 2022-12-30 DIAGNOSIS — C711 Malignant neoplasm of frontal lobe: Secondary | ICD-10-CM | POA: Diagnosis present

## 2022-12-30 DIAGNOSIS — Z79899 Other long term (current) drug therapy: Secondary | ICD-10-CM | POA: Diagnosis not present

## 2022-12-30 LAB — CBC WITH DIFFERENTIAL (CANCER CENTER ONLY)
Abs Immature Granulocytes: 0.04 10*3/uL (ref 0.00–0.07)
Basophils Absolute: 0.1 10*3/uL (ref 0.0–0.1)
Basophils Relative: 1 %
Eosinophils Absolute: 0.1 10*3/uL (ref 0.0–0.5)
Eosinophils Relative: 2 %
HCT: 37.7 % (ref 36.0–46.0)
Hemoglobin: 13.4 g/dL (ref 12.0–15.0)
Immature Granulocytes: 1 %
Lymphocytes Relative: 12 %
Lymphs Abs: 0.8 10*3/uL (ref 0.7–4.0)
MCH: 35.9 pg — ABNORMAL HIGH (ref 26.0–34.0)
MCHC: 35.5 g/dL (ref 30.0–36.0)
MCV: 101.1 fL — ABNORMAL HIGH (ref 80.0–100.0)
Monocytes Absolute: 0.5 10*3/uL (ref 0.1–1.0)
Monocytes Relative: 8 %
Neutro Abs: 4.9 10*3/uL (ref 1.7–7.7)
Neutrophils Relative %: 76 %
Platelet Count: 149 10*3/uL — ABNORMAL LOW (ref 150–400)
RBC: 3.73 MIL/uL — ABNORMAL LOW (ref 3.87–5.11)
RDW: 12.4 % (ref 11.5–15.5)
WBC Count: 6.4 10*3/uL (ref 4.0–10.5)
nRBC: 0 % (ref 0.0–0.2)

## 2022-12-30 LAB — CMP (CANCER CENTER ONLY)
ALT: 23 U/L (ref 0–44)
AST: 20 U/L (ref 15–41)
Albumin: 4.1 g/dL (ref 3.5–5.0)
Alkaline Phosphatase: 60 U/L (ref 38–126)
Anion gap: 7 (ref 5–15)
BUN: 25 mg/dL — ABNORMAL HIGH (ref 8–23)
CO2: 23 mmol/L (ref 22–32)
Calcium: 9.1 mg/dL (ref 8.9–10.3)
Chloride: 111 mmol/L (ref 98–111)
Creatinine: 1.13 mg/dL — ABNORMAL HIGH (ref 0.44–1.00)
GFR, Estimated: 54 mL/min — ABNORMAL LOW (ref >60)
Glucose, Bld: 99 mg/dL (ref 70–99)
Potassium: 3.9 mmol/L (ref 3.5–5.1)
Sodium: 141 mmol/L (ref 135–145)
Total Bilirubin: 0.7 mg/dL (ref 0.3–1.2)
Total Protein: 6.8 g/dL (ref 6.5–8.1)

## 2022-12-30 MED ORDER — PROCHLORPERAZINE MALEATE 10 MG PO TABS
10.0000 mg | ORAL_TABLET | Freq: Four times a day (QID) | ORAL | 0 refills | Status: DC | PRN
Start: 2022-12-30 — End: 2023-03-31
  Filled 2022-12-30 (×2): qty 30, 8d supply, fill #0

## 2022-12-30 MED ORDER — TEMOZOLOMIDE 250 MG PO CAPS
150.0000 mg/m2/d | ORAL_CAPSULE | Freq: Every day | ORAL | 0 refills | Status: DC
Start: 2022-12-30 — End: 2023-03-31
  Filled 2022-12-30 (×2): qty 5, 5d supply, fill #0

## 2022-12-30 MED ORDER — DEXAMETHASONE 2 MG PO TABS
2.0000 mg | ORAL_TABLET | Freq: Every day | ORAL | 0 refills | Status: DC
Start: 2022-12-30 — End: 2023-03-31
  Filled 2022-12-30 (×2): qty 5, 5d supply, fill #0

## 2022-12-30 NOTE — Progress Notes (Signed)
[facility] [address]  [place], [place] [address] [phone]   Interval Evaluation  Date of Service: 12/30/22 [name] Patient MRN: [id number] Patient DOB: [birth year] Provider: Henreitta Leber, MD  Identifying Statement:  Jennifer [name] is a 67 y.o. female with left frontal glioblastoma   Oncologic History: Oncology History  Glioblastoma multiforme of frontal lobe (HCC)  04/11/2022 Surgery   Craniotomy, resection of left frontal mass with Dr. Franky Macho; path is glioblastoma IDHwt.   05/12/2022 -  Chemotherapy   Patient is on Treatment Plan : BRAIN GLIOBLASTOMA Radiation Therapy With Concurrent Temozolomide 75 mg/m2 Daily Followed By Sequential Maintenance Temozolomide x 6-12 cycles       Biomarkers:  MGMT Unknown.  IDH 1/2 Wild type.  EGFR Unknown  TERT Unknown   Interval History: Jennifer [name] presents today for follow up, now having completed cycle #5 of Temodar and MRI study.  Did well this cycle.  No new or progressive neurologic changes reported compared to prior visit.  Headaches remain improved.  Denies severe headaches or seizures.    H+P (05/05/22) Patient presented to neurologic attention in mid-August 2023 with new onset difficulty speaking, confusion noticed by family.  She and her family described sudden onset speech impairment and confusion in the afternoon of the 16th, after having been seen in normal state of health that morning.  CNS imaging demonstrated an enhancing left frontal mass, which was resected by Dr. Franky Macho on 04/11/22.  Following surgery she had some speech difficulty, but improved quickly through rehab admission.  At present, she is fully independent with gait, speech, ADLs.  She is off decadron and Keppra.  Retired, had worked at [employer] in [place].  Today presents with son and daughter in law.    Medications: Current Outpatient Medications on File Prior to Visit  Medication Sig  Dispense Refill   amLODipine (NORVASC) 5 MG tablet TAKE 1 TABLET BY MOUTH EVERY DAY 90 tablet 1   atorvastatin (LIPITOR) 10 MG tablet TAKE 1 TABLET BY MOUTH EVERY DAY (Patient taking differently: Take 10 mg by mouth daily.) 90 tablet 1   busPIRone (BUSPAR) 5 MG tablet TAKE 1 TABLET BY MOUTH THREE TIMES A DAY (Patient taking differently: Take 5 mg by mouth 3 (three) times daily.) 270 tablet 1   dexamethasone (DECADRON) 2 MG tablet Take 1 tablet (2 mg total) by mouth daily. 5 tablet 0   fluticasone (FLONASE) 50 MCG/ACT nasal spray SPRAY 2 SPRAYS INTO EACH NOSTRIL EVERY DAY (Patient taking differently: Place 2 sprays into both nostrils daily.) 48 mL 0   losartan (COZAAR) 50 MG tablet TAKE 1 TABLET BY MOUTH EVERY DAY 90 tablet 1   meloxicam (MOBIC) 7.5 MG tablet Take 1 tablet by mouth 2 (two) times daily.     ondansetron (ZOFRAN) 8 MG tablet Take 1 tablet (8 mg total) by mouth every 8 (eight) hours as needed for nausea or vomiting. May take 30-60 minutes prior to Temodar administration if nausea/vomiting occurs as needed. 30 tablet 1   prochlorperazine (COMPAZINE) 10 MG tablet Take 1 tablet (10 mg total) by mouth every 6 (six) hours as needed for nausea or vomiting. 30 tablet 0   SUMAtriptan (IMITREX) 50 MG tablet Take 50 mg by mouth every 2 (two) hours as needed for migraine. May repeat in 2 hours if headache persists or recurs.     temozolomide (TEMODAR) 250 MG capsule Take 1 capsule (250 mg total) by mouth  daily. Take for 5 days on, 23 days off. Repeat every 28 days. May take on an empty stomach to decrease nausea & vomiting. 5 capsule 0   topiramate (TOPAMAX) 50 MG tablet TAKE 1 TABLET BY MOUTH TWICE A DAY (Patient taking differently: Take 50 mg by mouth 2 (two) times daily.) 60 tablet 0   traMADol (ULTRAM) 50 MG tablet Take 50 mg by mouth every 8 (eight) hours as needed.     traZODone (DESYREL) 50 MG tablet Take 1 tablet (50 mg total) by mouth at bedtime as needed for sleep. 30 tablet 2   No current  facility-administered medications on file prior to visit.    Allergies: No Known Allergies Past Medical History:  Past Medical History:  Diagnosis Date   Hyperlipidemia    Hypertension    Migraine    Past Surgical History:  Past Surgical History:  Procedure Laterality Date   APPENDECTOMY     APPLICATION OF CRANIAL NAVIGATION N/A 04/11/2022   Procedure: APPLICATION OF CRANIAL NAVIGATION;  Surgeon: Coletta Memos, MD;  Location: [facility] OR;  Service: Neurosurgery;  Laterality: N/A;   CRANIOTOMY Left 04/11/2022   Procedure: LEFT FRONTAL CRANIOTOMY FOR RESECTION OF TUMOR WITH [name];  Surgeon: Coletta Memos, MD;  Location: [facility] OR;  Service: Neurosurgery;  Laterality: Left;  RM 21   LAPAROSCOPIC APPENDECTOMY  07/15/2012   Procedure: APPENDECTOMY LAPAROSCOPIC;  Surgeon: Liz Malady, MD;  Location: [facility] OR;  Service: General;  Laterality: N/A;  Laparoscopic Appendectomy   Social History:  Social History   Socioeconomic History   Marital status: Widowed    Spouse name: Not on file   Number of children: 2   Years of education: Not on file   Highest education level: Not on file  Occupational History   Occupation: retail    Comment: [employer] in [place], [place]  Tobacco Use   Smoking status: Never   Smokeless tobacco: Never  Substance and Sexual Activity   Alcohol use: Yes    Comment: 2 drinks once every 2-3 months   Drug use: No   Sexual activity: Not on file  Other Topics Concern   Not on file  Social History Narrative   Marital status: widowed since 2008; husband was bipolar and alcoholic      Children: 2 children (28, 32); son lives with patient.Daughter lives in [place]/autism/does not drive.  Daughter lives with mother-in-law.  One grandpuppy      Lives: with son      Employment: works at [employer]; also works in Corporate investment banker business.      Tobacco: none      Alcohol: none; husband was an alcoholic.      Drugs:   none      Exercise:   Social Determinants of Health    Financial Resource Strain: Not on file  Food Insecurity: Not on file  Transportation Needs: Not on file  Physical Activity: Not on file  Stress: Not on file  Social Connections: Not on file  Intimate Partner Violence: Not on file   Family History:  Family History  Adopted: Yes    Review of Systems: Constitutional: Doesn't report fevers, chills or abnormal weight loss Eyes: Doesn't report blurriness of vision Ears, nose, mouth, throat, and face: Doesn't report sore throat Respiratory: Doesn't report cough, dyspnea or wheezes Cardiovascular: Doesn't report palpitation, chest discomfort  Gastrointestinal:  Doesn't report nausea, constipation, diarrhea GU: Doesn't report incontinence Skin: Doesn't report skin rashes Neurological: Per HPI Musculoskeletal: Doesn't report joint pain Behavioral/Psych:  Doesn't report anxiety  Physical Exam: Vitals:   12/30/22 1214  BP: (!) 142/67  Pulse: 93  Resp: 16  Temp: 97.7 F (36.5 C)  SpO2: 98%   KPS: 80 ECOG: 1 General: Alert, cooperative, pleasant, in no acute distress Head: Normal EENT: No conjunctival injection or scleral icterus.  Lungs: Resp effort normal Cardiac: Regular rate Abdomen: Non-distended abdomen Skin: No rashes cyanosis or petechiae. Extremities: No clubbing or edema  Neurologic Exam: Mental Status: Awake, alert, attentive to examiner. Oriented to self and environment. Language is fluent with intact comprehension.  Pseudobulbar elements. Cranial Nerves: Visual acuity is grossly normal. Visual fields are full. Extra-ocular movements intact. No ptosis. Face is symmetric Motor: Tone and bulk are normal. Power is full in both arms and legs. Reflexes are symmetric, no pathologic reflexes present.  Sensory: Intact to light touch Gait: Normal.   Labs: I have reviewed the data as listed    Component Value Date/Time   NA 142 12/01/2022 1048   NA 140 10/04/2018 1703   K 4.4 12/01/2022 1048   CL 112 (H)  12/01/2022 1048   CO2 25 12/01/2022 1048   GLUCOSE 91 12/01/2022 1048   BUN 26 (H) 12/01/2022 1048   BUN 24 10/04/2018 1703   CREATININE 1.19 (H) 12/01/2022 1048   CREATININE 1.06 (H) 01/29/2016 1518   CALCIUM 9.5 12/01/2022 1048   PROT 6.8 12/01/2022 1048   PROT 6.8 10/04/2018 1703   ALBUMIN 4.3 12/01/2022 1048   ALBUMIN 4.6 10/04/2018 1703   AST 19 12/01/2022 1048   ALT 18 12/01/2022 1048   ALKPHOS 61 12/01/2022 1048   BILITOT 0.5 12/01/2022 1048   GFRNONAA 51 (L) 12/01/2022 1048   GFRNONAA 58 (L) 01/29/2016 1518   GFRAA 70 10/04/2018 1703   GFRAA 67 01/29/2016 1518   Lab Results  Component Value Date   WBC 6.4 12/30/2022   NEUTROABS 4.9 12/30/2022   HGB 13.4 12/30/2022   HCT 37.7 12/30/2022   MCV 101.1 (H) 12/30/2022   PLT 149 (L) 12/30/2022    Assessment/Plan Glioblastoma multiforme of frontal lobe (HCC)  Jennifer [name] is clinically stable today, now having completed cycle #5 of 5-day Temodar.  No new or progressive changes.  Labs are within normal limits today.  We recommended continuing treatment with cycle #6 Temozolomide, dose reduced to 150mg /m2, on for five days and off for twenty three days in twenty eight day cycles. The patient will have a complete blood count performed on days 21 and 28 of each cycle, and a comprehensive metabolic panel performed on day 28 of each cycle. Labs may need to be performed more often. Zofran will prescribed for home use for nausea/vomiting.   Chemotherapy should be held for the following:  ANC less than 1,000  Platelets less than 100,000  LFT or creatinine greater than 2x ULN  If clinical concerns/contraindications develop  We recommended she continue Topamax 50mg  BID for migraine and seizure prevention.  May con't Trazodone 50mg  HS for insomnia.  Ok with decadron 2mg  daily x5 days to supplement Compazine/Zofarn regimen for nausea.    We ask that Jennifer [name] return to clinic in 1 months with MRI brain for evaluation  prior to cycle #7, or sooner as needed.  All questions were answered. The patient knows to call the clinic with any problems, questions or concerns. No barriers to learning were detected.  The total time spent in the encounter was 30 minutes and more than 50% was on counseling and review  of test results   Henreitta Leber, MD Medical Director of Neuro-Oncology [facility] at [place] 12/30/22 12:17 PM

## 2022-12-31 ENCOUNTER — Other Ambulatory Visit: Payer: Self-pay

## 2023-01-01 ENCOUNTER — Telehealth: Payer: Self-pay | Admitting: Internal Medicine

## 2023-01-02 ENCOUNTER — Telehealth: Payer: Self-pay | Admitting: Internal Medicine

## 2023-01-02 NOTE — Telephone Encounter (Signed)
Scheduled per 05/07 los, patient has been called and notified. 

## 2023-01-15 ENCOUNTER — Other Ambulatory Visit (HOSPITAL_COMMUNITY): Payer: Self-pay

## 2023-01-20 ENCOUNTER — Encounter: Payer: Self-pay | Admitting: Internal Medicine

## 2023-01-22 ENCOUNTER — Ambulatory Visit (HOSPITAL_COMMUNITY)
Admission: RE | Admit: 2023-01-22 | Discharge: 2023-01-22 | Disposition: A | Discharge status: Home / Self Care | Payer: Medicare Other | Source: Ambulatory Visit | Attending: Internal Medicine | Admitting: Internal Medicine

## 2023-01-22 DIAGNOSIS — C711 Malignant neoplasm of frontal lobe: Secondary | ICD-10-CM | POA: Insufficient documentation

## 2023-01-22 MED ORDER — GADOBUTROL 1 MMOL/ML IV SOLN
7.0000 mL | Freq: Once | INTRAVENOUS | Status: AC | PRN
  Administered 2023-01-22: 7 mL via INTRAVENOUS

## 2023-01-26 ENCOUNTER — Other Ambulatory Visit (HOSPITAL_COMMUNITY): Payer: Self-pay

## 2023-01-27 ENCOUNTER — Other Ambulatory Visit: Payer: Self-pay

## 2023-01-27 ENCOUNTER — Inpatient Hospital Stay (HOSPITAL_BASED_OUTPATIENT_CLINIC_OR_DEPARTMENT_OTHER): Payer: Medicare Other | Admitting: Internal Medicine

## 2023-01-27 ENCOUNTER — Other Ambulatory Visit (HOSPITAL_COMMUNITY): Payer: Self-pay

## 2023-01-27 ENCOUNTER — Inpatient Hospital Stay: Payer: Medicare Other | Attending: Internal Medicine

## 2023-01-27 ENCOUNTER — Other Ambulatory Visit: Payer: Medicare Other

## 2023-01-27 ENCOUNTER — Ambulatory Visit: Payer: Medicare Other | Admitting: Internal Medicine

## 2023-01-27 VITALS — BP 123/84 | HR 92 | Temp 98.0°F | Resp 17 | Wt 163.7 lb

## 2023-01-27 DIAGNOSIS — R519 Headache, unspecified: Secondary | ICD-10-CM | POA: Diagnosis not present

## 2023-01-27 DIAGNOSIS — Z79630 Long term (current) use of alkylating agent: Secondary | ICD-10-CM | POA: Diagnosis not present

## 2023-01-27 DIAGNOSIS — Z791 Long term (current) use of non-steroidal anti-inflammatories (NSAID): Secondary | ICD-10-CM | POA: Insufficient documentation

## 2023-01-27 DIAGNOSIS — Z7952 Long term (current) use of systemic steroids: Secondary | ICD-10-CM | POA: Diagnosis not present

## 2023-01-27 DIAGNOSIS — Z9221 Personal history of antineoplastic chemotherapy: Secondary | ICD-10-CM | POA: Insufficient documentation

## 2023-01-27 DIAGNOSIS — C711 Malignant neoplasm of frontal lobe: Secondary | ICD-10-CM

## 2023-01-27 DIAGNOSIS — Z79899 Other long term (current) drug therapy: Secondary | ICD-10-CM | POA: Diagnosis not present

## 2023-01-27 DIAGNOSIS — I10 Essential (primary) hypertension: Secondary | ICD-10-CM | POA: Insufficient documentation

## 2023-01-27 LAB — CMP (CANCER CENTER ONLY)
ALT: 18 U/L (ref 0–44)
AST: 19 U/L (ref 15–41)
Albumin: 4.2 g/dL (ref 3.5–5.0)
Alkaline Phosphatase: 62 U/L (ref 38–126)
Anion gap: 6 (ref 5–15)
BUN: 25 mg/dL — ABNORMAL HIGH (ref 8–23)
CO2: 25 mmol/L (ref 22–32)
Calcium: 9.4 mg/dL (ref 8.9–10.3)
Chloride: 112 mmol/L — ABNORMAL HIGH (ref 98–111)
Creatinine: 1.33 mg/dL — ABNORMAL HIGH (ref 0.44–1.00)
GFR, Estimated: 44 mL/min — ABNORMAL LOW (ref >60)
Glucose, Bld: 100 mg/dL — ABNORMAL HIGH (ref 70–99)
Potassium: 4.1 mmol/L (ref 3.5–5.1)
Sodium: 143 mmol/L (ref 135–145)
Total Bilirubin: 0.5 mg/dL (ref 0.3–1.2)
Total Protein: 6.7 g/dL (ref 6.5–8.1)

## 2023-01-27 LAB — CBC WITH DIFFERENTIAL (CANCER CENTER ONLY)
Abs Immature Granulocytes: 0.03 10*3/uL (ref 0.00–0.07)
Basophils Absolute: 0 10*3/uL (ref 0.0–0.1)
Basophils Relative: 1 %
Eosinophils Absolute: 0.1 10*3/uL (ref 0.0–0.5)
Eosinophils Relative: 1 %
HCT: 38.4 % (ref 36.0–46.0)
Hemoglobin: 13.4 g/dL (ref 12.0–15.0)
Immature Granulocytes: 1 %
Lymphocytes Relative: 9 %
Lymphs Abs: 0.6 10*3/uL — ABNORMAL LOW (ref 0.7–4.0)
MCH: 35 pg — ABNORMAL HIGH (ref 26.0–34.0)
MCHC: 34.9 g/dL (ref 30.0–36.0)
MCV: 100.3 fL — ABNORMAL HIGH (ref 80.0–100.0)
Monocytes Absolute: 0.5 10*3/uL (ref 0.1–1.0)
Monocytes Relative: 7 %
Neutro Abs: 5.2 10*3/uL (ref 1.7–7.7)
Neutrophils Relative %: 81 %
Platelet Count: 153 10*3/uL (ref 150–400)
RBC: 3.83 MIL/uL — ABNORMAL LOW (ref 3.87–5.11)
RDW: 12.4 % (ref 11.5–15.5)
WBC Count: 6.4 10*3/uL (ref 4.0–10.5)
nRBC: 0 % (ref 0.0–0.2)

## 2023-01-27 NOTE — Progress Notes (Signed)
[facility] at [facility] [address]  [place], [place] [address] [phone]   Interval Evaluation  Date of Service: 01/27/23 [name] Patient MRN: [id number] Patient DOB: May 01, 1956 Provider: Henreitta Leber, MD  Identifying Statement:  Jennifer [name] is a 67 y.o. female with left frontal glioblastoma   Oncologic History: Oncology History  Glioblastoma multiforme of frontal lobe (HCC)  04/11/2022 Surgery   Craniotomy, resection of left frontal mass with Dr. Franky Macho; path is glioblastoma IDHwt.   05/12/2022 -  Chemotherapy   Patient is on Treatment Plan : BRAIN GLIOBLASTOMA Radiation Therapy With Concurrent Temozolomide 75 mg/m2 Daily Followed By Sequential Maintenance Temozolomide x 6-12 cycles       Biomarkers:  MGMT Unknown.  IDH 1/2 Wild type.  EGFR Unknown  TERT Unknown   Interval History: Jennifer [name] presents today for follow up, now having completed cycle #6 of Temodar and MRI study.  Continues to have some nausea during treatment days despite the decadron.  No new or progressive neurologic changes reported compared to prior visit.  Headaches remain improved.  Denies severe headaches or seizures.    H+P (05/05/22) Patient presented to neurologic attention in mid-August 2023 with new onset difficulty speaking, confusion noticed by family.  She and her family described sudden onset speech impairment and confusion in the afternoon of the 16th, after having been seen in normal state of health that morning.  CNS imaging demonstrated an enhancing left frontal mass, which was resected by Dr. Franky Macho on 04/11/22.  Following surgery she had some speech difficulty, but improved quickly through rehab admission.  At present, she is fully independent with gait, speech, ADLs.  She is off decadron and Keppra.  Retired, had worked at [employer] in [employer].  Today presents with son and daughter in law.    Medications: Current Outpatient  Medications on File Prior to Visit  Medication Sig Dispense Refill   amLODipine (NORVASC) 5 MG tablet TAKE 1 TABLET BY MOUTH EVERY DAY 90 tablet 1   atorvastatin (LIPITOR) 10 MG tablet TAKE 1 TABLET BY MOUTH EVERY DAY (Patient taking differently: Take 10 mg by mouth daily.) 90 tablet 1   busPIRone (BUSPAR) 5 MG tablet TAKE 1 TABLET BY MOUTH THREE TIMES A DAY (Patient taking differently: Take 5 mg by mouth 3 (three) times daily.) 270 tablet 1   dexamethasone (DECADRON) 2 MG tablet Take 1 tablet (2 mg total) by mouth daily. 5 tablet 0   fluticasone (FLONASE) 50 MCG/ACT nasal spray SPRAY 2 SPRAYS INTO EACH NOSTRIL EVERY DAY (Patient taking differently: Place 2 sprays into both nostrils daily.) 48 mL 0   losartan (COZAAR) 50 MG tablet TAKE 1 TABLET BY MOUTH EVERY DAY 90 tablet 1   meloxicam (MOBIC) 7.5 MG tablet Take 1 tablet by mouth 2 (two) times daily.     ondansetron (ZOFRAN) 8 MG tablet Take 1 tablet (8 mg total) by mouth every 8 (eight) hours as needed for nausea or vomiting. May take 30-60 minutes prior to Temodar administration if nausea/vomiting occurs as needed. 30 tablet 1   prochlorperazine (COMPAZINE) 10 MG tablet Take 1 tablet (10 mg total) by mouth every 6 (six) hours as needed for nausea or vomiting. 30 tablet 0   SUMAtriptan (IMITREX) 50 MG tablet Take 50 mg by mouth every 2 (two) hours as needed for migraine. May repeat in 2 hours if headache persists or recurs.     temozolomide (TEMODAR) 250 MG capsule Take  1 capsule (250 mg total) by mouth daily. Take for 5 days on, 23 days off. Repeat every 28 days. May take on an empty stomach to decrease nausea & vomiting. 5 capsule 0   topiramate (TOPAMAX) 50 MG tablet TAKE 1 TABLET BY MOUTH TWICE A DAY (Patient taking differently: Take 50 mg by mouth 2 (two) times daily.) 60 tablet 0   traMADol (ULTRAM) 50 MG tablet Take 50 mg by mouth every 8 (eight) hours as needed.     traZODone (DESYREL) 50 MG tablet Take 1 tablet (50 mg total) by mouth at  bedtime as needed for sleep. 30 tablet 2   No current facility-administered medications on file prior to visit.    Allergies: No Known Allergies Past Medical History:  Past Medical History:  Diagnosis Date   Hyperlipidemia    Hypertension    Migraine    Past Surgical History:  Past Surgical History:  Procedure Laterality Date   APPENDECTOMY     APPLICATION OF CRANIAL NAVIGATION N/A 04/11/2022   Procedure: APPLICATION OF CRANIAL NAVIGATION;  Surgeon: Coletta Memos, MD;  Location: [facility] OR;  Service: Neurosurgery;  Laterality: N/A;   CRANIOTOMY Left 04/11/2022   Procedure: LEFT FRONTAL CRANIOTOMY FOR RESECTION OF TUMOR WITH [name];  Surgeon: Coletta Memos, MD;  Location: [facility] OR;  Service: Neurosurgery;  Laterality: Left;  RM 21   LAPAROSCOPIC APPENDECTOMY  07/15/2012   Procedure: APPENDECTOMY LAPAROSCOPIC;  Surgeon: Liz Malady, MD;  Location: [facility] OR;  Service: General;  Laterality: N/A;  Laparoscopic Appendectomy   Social History:  Social History   Socioeconomic History   Marital status: Widowed    Spouse name: Not on file   Number of children: 2   Years of education: Not on file   Highest education level: Not on file  Occupational History   Occupation: retail    Comment: [employer] in [place], [place]  Tobacco Use   Smoking status: Never   Smokeless tobacco: Never  Substance and Sexual Activity   Alcohol use: Yes    Comment: 2 drinks once every 2-3 months   Drug use: No   Sexual activity: Not on file  Other Topics Concern   Not on file  Social History Narrative   Marital status: widowed since 2008; husband was bipolar and alcoholic      Children: 2 children (28, 71); son lives with patient.Daughter lives in [place]/autism/does not drive.  Daughter lives with mother-in-law.  One grandpuppy      Lives: with son      Employment: works at [employer]; also works in Corporate investment banker business.      Tobacco: none      Alcohol: none; husband was an alcoholic.      Drugs:    none      Exercise:   Social Determinants of Health   Financial Resource Strain: Not on file  Food Insecurity: Not on file  Transportation Needs: Not on file  Physical Activity: Not on file  Stress: Not on file  Social Connections: Not on file  Intimate Partner Violence: Not on file   Family History:  Family History  Adopted: Yes    Review of Systems: Constitutional: Doesn't report fevers, chills or abnormal weight loss Eyes: Doesn't report blurriness of vision Ears, nose, mouth, throat, and face: Doesn't report sore throat Respiratory: Doesn't report cough, dyspnea or wheezes Cardiovascular: Doesn't report palpitation, chest discomfort  Gastrointestinal:  Doesn't report nausea, constipation, diarrhea GU: Doesn't report incontinence Skin: Doesn't report skin rashes Neurological: Per  HPI Musculoskeletal: Doesn't report joint pain Behavioral/Psych: Doesn't report anxiety  Physical Exam: Vitals:   01/27/23 1204  BP: 123/84  Pulse: 92  Resp: 17  Temp: 98 F (36.7 C)  SpO2: 100%   KPS: 80 ECOG: 1 General: Alert, cooperative, pleasant, in no acute distress Head: Normal EENT: No conjunctival injection or scleral icterus.  Lungs: Resp effort normal Cardiac: Regular rate Abdomen: Non-distended abdomen Skin: No rashes cyanosis or petechiae. Extremities: No clubbing or edema  Neurologic Exam: Mental Status: Awake, alert, attentive to examiner. Oriented to self and environment. Language is fluent with intact comprehension.  Pseudobulbar elements. Cranial Nerves: Visual acuity is grossly normal. Visual fields are full. Extra-ocular movements intact. No ptosis. Face is symmetric Motor: Tone and bulk are normal. Power is full in both arms and legs. Reflexes are symmetric, no pathologic reflexes present.  Sensory: Intact to light touch Gait: Normal.   Labs: I have reviewed the data as listed    Component Value Date/Time   NA 143 01/27/2023 1123   NA 140 10/04/2018  1703   K 4.1 01/27/2023 1123   CL 112 (H) 01/27/2023 1123   CO2 25 01/27/2023 1123   GLUCOSE 100 (H) 01/27/2023 1123   BUN 25 (H) 01/27/2023 1123   BUN 24 10/04/2018 1703   CREATININE 1.33 (H) 01/27/2023 1123   CREATININE 1.06 (H) 01/29/2016 1518   CALCIUM 9.4 01/27/2023 1123   PROT 6.7 01/27/2023 1123   PROT 6.8 10/04/2018 1703   ALBUMIN 4.2 01/27/2023 1123   ALBUMIN 4.6 10/04/2018 1703   AST 19 01/27/2023 1123   ALT 18 01/27/2023 1123   ALKPHOS 62 01/27/2023 1123   BILITOT 0.5 01/27/2023 1123   GFRNONAA 44 (L) 01/27/2023 1123   GFRNONAA 58 (L) 01/29/2016 1518   GFRAA 70 10/04/2018 1703   GFRAA 67 01/29/2016 1518   Lab Results  Component Value Date   WBC 6.4 01/27/2023   NEUTROABS 5.2 01/27/2023   HGB 13.4 01/27/2023   HCT 38.4 01/27/2023   MCV 100.3 (H) 01/27/2023   PLT 153 01/27/2023    Imaging:  [facility] Clinician Interpretation: I have personally reviewed the CNS images as listed.  My interpretation, in the context of the patient's clinical presentation, is stable disease pending official read  No results found.   Assessment/Plan Glioblastoma multiforme of frontal lobe (HCC)  Jennifer [name] is clinically stable today, now having completed cycle #6 of 5-day Temodar.  MRI demonstrates stable findings, official read is pending.  We discussed transitioning to MRI surveillance given at times poor tolerance of Temodar, gradually elevating creatinine.  She is agreeable with this.  We recommended she continue Topamax 50mg  BID for migraine and seizure prevention.  May con't Trazodone 50mg  HS for insomnia.  We ask that Jennifer [name] return to clinic in 2 months with MRI brain for evaluation, or sooner as needed.  All questions were answered. The patient knows to call the clinic with any problems, questions or concerns. No barriers to learning were detected.  The total time spent in the encounter was 40 minutes and more than 50% was on counseling and review of test  results   Henreitta Leber, MD Medical Director of Neuro-Oncology [facility] at [facility] 01/27/23 12:12 PM

## 2023-01-28 ENCOUNTER — Other Ambulatory Visit: Payer: Self-pay

## 2023-01-29 ENCOUNTER — Other Ambulatory Visit: Payer: Self-pay

## 2023-02-25 ENCOUNTER — Emergency Department (HOSPITAL_COMMUNITY)
Admission: EM | Admit: 2023-02-25 | Discharge: 2023-02-25 | Disposition: A | Discharge status: Home / Self Care | Payer: Medicare Other | Attending: Emergency Medicine | Admitting: Emergency Medicine

## 2023-02-25 ENCOUNTER — Encounter (HOSPITAL_COMMUNITY): Payer: Self-pay | Admitting: Emergency Medicine

## 2023-02-25 ENCOUNTER — Other Ambulatory Visit: Payer: Self-pay

## 2023-02-25 ENCOUNTER — Emergency Department (HOSPITAL_COMMUNITY): Payer: Medicare Other

## 2023-02-25 DIAGNOSIS — Z79899 Other long term (current) drug therapy: Secondary | ICD-10-CM | POA: Insufficient documentation

## 2023-02-25 DIAGNOSIS — R569 Unspecified convulsions: Secondary | ICD-10-CM | POA: Diagnosis present

## 2023-02-25 DIAGNOSIS — I10 Essential (primary) hypertension: Secondary | ICD-10-CM | POA: Diagnosis not present

## 2023-02-25 HISTORY — DX: Malignant neoplasm of brain, unspecified: C71.9

## 2023-02-25 LAB — BASIC METABOLIC PANEL
Anion gap: 11 (ref 5–15)
BUN: 31 mg/dL — ABNORMAL HIGH (ref 8–23)
CO2: 18 mmol/L — ABNORMAL LOW (ref 22–32)
Calcium: 8.6 mg/dL — ABNORMAL LOW (ref 8.9–10.3)
Chloride: 110 mmol/L (ref 98–111)
Creatinine, Ser: 1.33 mg/dL — ABNORMAL HIGH (ref 0.44–1.00)
GFR, Estimated: 44 mL/min — ABNORMAL LOW (ref >60)
Glucose, Bld: 83 mg/dL (ref 70–99)
Potassium: 3.8 mmol/L (ref 3.5–5.1)
Sodium: 139 mmol/L (ref 135–145)

## 2023-02-25 LAB — CBC
HCT: 37.3 % (ref 36.0–46.0)
Hemoglobin: 12.5 g/dL (ref 12.0–15.0)
MCH: 34.1 pg — ABNORMAL HIGH (ref 26.0–34.0)
MCHC: 33.5 g/dL (ref 30.0–36.0)
MCV: 101.6 fL — ABNORMAL HIGH (ref 80.0–100.0)
Platelets: 178 10*3/uL (ref 150–400)
RBC: 3.67 MIL/uL — ABNORMAL LOW (ref 3.87–5.11)
RDW: 12 % (ref 11.5–15.5)
WBC: 8.9 10*3/uL (ref 4.0–10.5)
nRBC: 0 % (ref 0.0–0.2)

## 2023-02-25 NOTE — ED Triage Notes (Signed)
Pt BIB [facility] for seizure.  PT was at a home goods store and was witnessed to have fallen back and hit her head and had [employer] activity for approx. 2 min.  Pt has a small bite mark to right lateral aspect of tongue.  No incontinence reported.  Pt takes Topomax and is current.    PT has hx of terminal dx of brain cancer.  138/70 98% HR 110 CBG 106 A&O x4

## 2023-02-25 NOTE — Discharge Instructions (Addendum)
Continue your current medications.  Follow-up with Dr. Barbaraann Cao as we discussed

## 2023-02-25 NOTE — ED Provider Notes (Signed)
[place] EMERGENCY DEPARTMENT AT [facility] Provider Note   CSN: [id number] Arrival date & time: 02/25/23  1731     History  Chief Complaint  Patient presents with   Seizures    Jennifer [name] is a 67 y.o. female.  HPI   Patient has history of hypertension migraines hyperlipidemia and glioblastoma multiforme.  Patient has had prior craniotomy.  She is also had chemotherapy.  Patient presents to the ED for evaluation of a seizure.  Patient was out shopping when she had witnessed tonic-clonic seizure activity.  EMS was called and the patient was brought to the ED.  Unclear if she possibly hit her head when she fell.  Patient denies any complaints of severe headache or neck pain at this time.  No pain in her extremities.  She is now awake and alert.  She states she took her seizure medications this morning  Home Medications Prior to Admission medications   Medication Sig Start Date End Date Taking? Authorizing Provider  amLODipine (NORVASC) 5 MG tablet TAKE 1 TABLET BY MOUTH EVERY DAY 06/06/19   Shade Flood, MD  atorvastatin (LIPITOR) 10 MG tablet TAKE 1 TABLET BY MOUTH EVERY DAY Patient taking differently: Take 10 mg by mouth daily. 11/10/19   Shade Flood, MD  busPIRone (BUSPAR) 5 MG tablet TAKE 1 TABLET BY MOUTH THREE TIMES A DAY Patient taking differently: Take 5 mg by mouth 3 (three) times daily. 08/16/19   Shade Flood, MD  dexamethasone (DECADRON) 2 MG tablet Take 1 tablet (2 mg total) by mouth daily. Patient taking differently: Take 2 mg by mouth daily. Takes when she is taking temodar 12/30/22   Vaslow, Georgeanna Lea, MD  fluticasone (FLONASE) 50 MCG/ACT nasal spray SPRAY 2 SPRAYS INTO EACH NOSTRIL EVERY DAY Patient taking differently: Place 2 sprays into both nostrils daily. 09/21/19   Shade Flood, MD  losartan (COZAAR) 50 MG tablet TAKE 1 TABLET BY MOUTH EVERY DAY 04/28/19   Shade Flood, MD  meloxicam (MOBIC) 7.5 MG tablet Take 1 tablet by mouth  2 (two) times daily. 06/16/22   [provider]  ondansetron (ZOFRAN) 8 MG tablet Take 1 tablet (8 mg total) by mouth every 8 (eight) hours as needed for nausea or vomiting. May take 30-60 minutes prior to Temodar administration if nausea/vomiting occurs as needed. 06/26/22   Henreitta Leber, MD  prochlorperazine (COMPAZINE) 10 MG tablet Take 1 tablet (10 mg total) by mouth every 6 (six) hours as needed for nausea or vomiting. 12/30/22   Vaslow, Georgeanna Lea, MD  SUMAtriptan (IMITREX) 50 MG tablet Take 50 mg by mouth every 2 (two) hours as needed for migraine. May repeat in 2 hours if headache persists or recurs.    [provider]  temozolomide (TEMODAR) 250 MG capsule Take 1 capsule (250 mg total) by mouth daily. Take for 5 days on, 23 days off. Repeat every 28 days. May take on an empty stomach to decrease nausea & vomiting. 12/30/22   Vaslow, Georgeanna Lea, MD  topiramate (TOPAMAX) 50 MG tablet TAKE 1 TABLET BY MOUTH TWICE A DAY Patient taking differently: Take 50 mg by mouth 2 (two) times daily. 12/26/19   Shade Flood, MD  traMADol (ULTRAM) 50 MG tablet Take 50 mg by mouth every 8 (eight) hours as needed. 10/13/13   [provider]  traZODone (DESYREL) 50 MG tablet Take 1 tablet (50 mg total) by mouth at bedtime as needed for sleep. 05/05/22  Henreitta Leber, MD      Allergies    Patient has no known allergies.    Review of Systems   Review of Systems  Physical Exam Updated Vital Signs BP 128/77   Pulse 88   Temp 97.7 F (36.5 C) (Oral)   Resp 16   SpO2 98%  Physical Exam Vitals and nursing note reviewed.  Constitutional:      Appearance: She is well-developed. She is not diaphoretic.  HENT:     Head: Normocephalic and atraumatic.     Right Ear: External ear normal.     Left Ear: External ear normal.  Eyes:     General: No scleral icterus.       Right eye: No discharge.        Left eye: No discharge.     Conjunctiva/sclera: Conjunctivae normal.  Neck:      Trachea: No tracheal deviation.     Comments: Cervical collar in place Cardiovascular:     Rate and Rhythm: Normal rate and regular rhythm.  Pulmonary:     Effort: Pulmonary effort is normal. No respiratory distress.     Breath sounds: Normal breath sounds. No stridor. No wheezing or rales.  Abdominal:     General: Bowel sounds are normal. There is no distension.     Palpations: Abdomen is soft.     Tenderness: There is no abdominal tenderness. There is no guarding or rebound.  Musculoskeletal:        General: No tenderness or deformity.     Cervical back: Neck supple.  Skin:    General: Skin is warm and dry.     Findings: No rash.  Neurological:     General: No focal deficit present.     Mental Status: She is alert.     Cranial Nerves: No cranial nerve deficit, dysarthria or facial asymmetry.     Sensory: No sensory deficit.     Motor: No abnormal muscle tone or seizure activity.     Coordination: Coordination normal.     Comments: Normal strength and sensation bilateral upper extremities lower extremities, no cervical thoracic or lumbar spine tenderness  Psychiatric:        Mood and Affect: Mood normal.     ED Results / Procedures / Treatments   Labs (all labs ordered are listed, but only abnormal results are displayed) Labs Reviewed  CBC - Abnormal; Notable for the following components:      Result Value   RBC 3.67 (*)    MCV 101.6 (*)    MCH 34.1 (*)    All other components within normal limits  BASIC METABOLIC PANEL - Abnormal; Notable for the following components:   CO2 18 (*)    BUN 31 (*)    Creatinine, Ser 1.33 (*)    Calcium 8.6 (*)    GFR, Estimated 44 (*)    All other components within normal limits    EKG None  Radiology CT Head Wo Contrast  Result Date: 02/25/2023 CLINICAL DATA:  Head and neck trauma EXAM: CT HEAD WITHOUT CONTRAST CT CERVICAL SPINE WITHOUT CONTRAST TECHNIQUE: Multidetector CT imaging of the head and cervical spine was performed  following the standard protocol without intravenous contrast. Multiplanar CT image reconstructions of the cervical spine were also generated. RADIATION DOSE REDUCTION: This exam was performed according to the departmental dose-optimization program which includes automated exposure control, adjustment of the mA and/or kV according to patient size and/or use of iterative reconstruction technique. COMPARISON:  CT head and CTA head neck 04/09/2022, correlation is also made with 01/22/2023 MRI head FINDINGS: CT HEAD FINDINGS Brain: Hypodensity in the left frontal lobe, which correlates with the resection cavity and associated edema in the left frontal lobe on the most recent MRI. A small left frontal subdural collection underneath the craniotomy flap appears similar to the prior MRI. No acute infarct, hemorrhage, mass, mass effect, or midline shift. No hydrocephalus. Vascular: No hyperdense vessel. Atherosclerotic calcifications in the intracranial carotid and vertebral arteries. Skull: Negative for fracture or focal lesion. Prior left frontal craniotomy. Sinuses/Orbits: No acute finding. Other: The mastoid air cells are well aerated. CT CERVICAL SPINE FINDINGS Alignment: No traumatic listhesis. Reversal of the normal cervical lordosis. Skull base and vertebrae: No acute fracture or suspicious osseous lesion. Soft tissues and spinal canal: No prevertebral fluid or swelling. No visible canal hematoma. Disc levels: Degenerative changes in the cervical spine. No significant spinal canal stenosis. Upper chest: Possible interlobular septal thickening. No pleural effusion. IMPRESSION: 1. Hypodensity in the left frontal lobe, which correlates with the resection cavity and associated edema in the left frontal lobe on the most recent MRI. A small left frontal subdural collection underneath the craniotomy flap appears similar to the prior MRI. 2. No acute intracranial process. 3. No acute fracture or traumatic listhesis in the  cervical spine. Electronically Signed   By: Wiliam Ke M.D.   On: 02/25/2023 19:17   CT Cervical Spine Wo Contrast  Result Date: 02/25/2023 CLINICAL DATA:  Head and neck trauma EXAM: CT HEAD WITHOUT CONTRAST CT CERVICAL SPINE WITHOUT CONTRAST TECHNIQUE: Multidetector CT imaging of the head and cervical spine was performed following the standard protocol without intravenous contrast. Multiplanar CT image reconstructions of the cervical spine were also generated. RADIATION DOSE REDUCTION: This exam was performed according to the departmental dose-optimization program which includes automated exposure control, adjustment of the mA and/or kV according to patient size and/or use of iterative reconstruction technique. COMPARISON:  CT head and CTA head neck 04/09/2022, correlation is also made with 01/22/2023 MRI head FINDINGS: CT HEAD FINDINGS Brain: Hypodensity in the left frontal lobe, which correlates with the resection cavity and associated edema in the left frontal lobe on the most recent MRI. A small left frontal subdural collection underneath the craniotomy flap appears similar to the prior MRI. No acute infarct, hemorrhage, mass, mass effect, or midline shift. No hydrocephalus. Vascular: No hyperdense vessel. Atherosclerotic calcifications in the intracranial carotid and vertebral arteries. Skull: Negative for fracture or focal lesion. Prior left frontal craniotomy. Sinuses/Orbits: No acute finding. Other: The mastoid air cells are well aerated. CT CERVICAL SPINE FINDINGS Alignment: No traumatic listhesis. Reversal of the normal cervical lordosis. Skull base and vertebrae: No acute fracture or suspicious osseous lesion. Soft tissues and spinal canal: No prevertebral fluid or swelling. No visible canal hematoma. Disc levels: Degenerative changes in the cervical spine. No significant spinal canal stenosis. Upper chest: Possible interlobular septal thickening. No pleural effusion. IMPRESSION: 1. Hypodensity in  the left frontal lobe, which correlates with the resection cavity and associated edema in the left frontal lobe on the most recent MRI. A small left frontal subdural collection underneath the craniotomy flap appears similar to the prior MRI. 2. No acute intracranial process. 3. No acute fracture or traumatic listhesis in the cervical spine. Electronically Signed   By: Wiliam Ke M.D.   On: 02/25/2023 19:17    Procedures Procedures    Medications Ordered in ED Medications - No data to display  ED Course/ Medical Decision Making/ A&P Clinical Course as of 02/25/23 2213  Wed Feb 25, 2023  2001 Head CT shows hypodensity in the left frontal lobe, small frontal subdural collection similar to prior MRI, no acute process [JK]  2002 No acute findings on CT scan [JK]  2055 CBC normal [JK]    Clinical Course User Index [JK] Linwood Dibbles, MD                             Medical Decision Making Problems Addressed: Seizure [facility]): acute illness or injury that poses a threat to life or bodily functions  Amount and/or Complexity of Data Reviewed Labs: ordered. Decision-making details documented in ED Course. Radiology: ordered and independent interpretation performed.   Presented to ER for evaluation after a seizure.  Patient does have history of known glioblastoma.  She does have history of seizure disorder.  Patient has not had any recurrent seizures in the ED.  Her labs are unremarkable.  No acute findings noted on CT scan.  Has been compliant with her medications.  Will have her follow-up with her radiation oncologist, continue current seizure medication regimen at this time.  Patient understands to return for any recurrent episodes.  Patient is overall feeling well and is anxious to go home.        Final Clinical Impression(s) / ED Diagnoses Final diagnoses:  Seizure [facility])    Rx / DC Orders ED Discharge Orders     None         Linwood Dibbles, MD 02/25/23 2214

## 2023-02-25 NOTE — ED Notes (Signed)
Shift report received from [facility]

## 2023-03-04 ENCOUNTER — Telehealth: Payer: Self-pay | Admitting: *Deleted

## 2023-03-04 NOTE — Telephone Encounter (Signed)
-----   Message from Henreitta Leber, MD sent at 03/04/2023  3:35 PM EDT ----- Regarding: RE: Recent seizure I reviewed the scan, 8/6 is fine but I can see her next week if she has questions or concerns ----- Message ----- From: Arville Care, RN Sent: 03/04/2023   3:26 PM EDT To: Henreitta Leber, MD Subject: Recent seizure                                 Jennifer [name] called to let us know she had a seizure on 7/3 & was taken to the ED.  (She was surprised we didn't know about this.)  She had labs & a CT which were WNL.  She is asking if you want to see her as planned on 8/6 or if you need to see her sooner.  She says she feels perfectly fine now.  [name]

## 2023-03-04 NOTE — Telephone Encounter (Signed)
PC to patient, informed her Dr Barbaraann Cao reviewed her CT scan & states it is ok for her to wait until her August appointment to come in unless she wants to come sooner.  Patient states she is fine with waiting & will be here as planned in August.

## 2023-03-26 ENCOUNTER — Ambulatory Visit (HOSPITAL_COMMUNITY)
Admission: RE | Admit: 2023-03-26 | Discharge: 2023-03-26 | Disposition: A | Discharge status: Home / Self Care | Payer: Medicare Other | Source: Ambulatory Visit | Attending: Internal Medicine | Admitting: Internal Medicine

## 2023-03-26 DIAGNOSIS — C711 Malignant neoplasm of frontal lobe: Secondary | ICD-10-CM | POA: Diagnosis present

## 2023-03-26 MED ORDER — GADOBUTROL 1 MMOL/ML IV SOLN
7.5000 mL | Freq: Once | INTRAVENOUS | Status: AC | PRN
  Administered 2023-03-26: 7.5 mL via INTRAVENOUS

## 2023-03-31 ENCOUNTER — Other Ambulatory Visit (HOSPITAL_COMMUNITY): Payer: Self-pay

## 2023-03-31 ENCOUNTER — Other Ambulatory Visit: Payer: Self-pay

## 2023-03-31 ENCOUNTER — Inpatient Hospital Stay: Payer: Medicare Other | Admitting: Internal Medicine

## 2023-03-31 ENCOUNTER — Telehealth: Payer: Self-pay | Admitting: Pharmacist

## 2023-03-31 ENCOUNTER — Inpatient Hospital Stay: Payer: Medicare Other | Attending: Internal Medicine

## 2023-03-31 VITALS — BP 151/70 | HR 104 | Temp 98.4°F | Resp 17 | Wt 166.3 lb

## 2023-03-31 DIAGNOSIS — Z79630 Long term (current) use of alkylating agent: Secondary | ICD-10-CM | POA: Diagnosis not present

## 2023-03-31 DIAGNOSIS — R569 Unspecified convulsions: Secondary | ICD-10-CM | POA: Diagnosis not present

## 2023-03-31 DIAGNOSIS — Z79899 Other long term (current) drug therapy: Secondary | ICD-10-CM | POA: Insufficient documentation

## 2023-03-31 DIAGNOSIS — C711 Malignant neoplasm of frontal lobe: Secondary | ICD-10-CM

## 2023-03-31 LAB — CMP (CANCER CENTER ONLY)
ALT: 24 U/L (ref 0–44)
AST: 20 U/L (ref 15–41)
Albumin: 4.2 g/dL (ref 3.5–5.0)
Alkaline Phosphatase: 70 U/L (ref 38–126)
Anion gap: 5 (ref 5–15)
BUN: 26 mg/dL — ABNORMAL HIGH (ref 8–23)
CO2: 25 mmol/L (ref 22–32)
Calcium: 9.4 mg/dL (ref 8.9–10.3)
Chloride: 113 mmol/L — ABNORMAL HIGH (ref 98–111)
Creatinine: 1.29 mg/dL — ABNORMAL HIGH (ref 0.44–1.00)
GFR, Estimated: 46 mL/min — ABNORMAL LOW (ref >60)
Glucose, Bld: 112 mg/dL — ABNORMAL HIGH (ref 70–99)
Potassium: 3.9 mmol/L (ref 3.5–5.1)
Sodium: 143 mmol/L (ref 135–145)
Total Bilirubin: 0.5 mg/dL (ref 0.3–1.2)
Total Protein: 6.6 g/dL (ref 6.5–8.1)

## 2023-03-31 LAB — CBC WITH DIFFERENTIAL (CANCER CENTER ONLY)
Abs Immature Granulocytes: 0.06 10*3/uL (ref 0.00–0.07)
Basophils Absolute: 0 10*3/uL (ref 0.0–0.1)
Basophils Relative: 1 %
Eosinophils Absolute: 0.1 10*3/uL (ref 0.0–0.5)
Eosinophils Relative: 1 %
HCT: 38.3 % (ref 36.0–46.0)
Hemoglobin: 13.7 g/dL (ref 12.0–15.0)
Immature Granulocytes: 1 %
Lymphocytes Relative: 11 %
Lymphs Abs: 0.9 10*3/uL (ref 0.7–4.0)
MCH: 35.3 pg — ABNORMAL HIGH (ref 26.0–34.0)
MCHC: 35.8 g/dL (ref 30.0–36.0)
MCV: 98.7 fL (ref 80.0–100.0)
Monocytes Absolute: 0.5 10*3/uL (ref 0.1–1.0)
Monocytes Relative: 6 %
Neutro Abs: 6.7 10*3/uL (ref 1.7–7.7)
Neutrophils Relative %: 80 %
Platelet Count: 168 10*3/uL (ref 150–400)
RBC: 3.88 MIL/uL (ref 3.87–5.11)
RDW: 12.2 % (ref 11.5–15.5)
WBC Count: 8.2 10*3/uL (ref 4.0–10.5)
nRBC: 0 % (ref 0.0–0.2)

## 2023-03-31 MED ORDER — PROCHLORPERAZINE MALEATE 10 MG PO TABS
10.0000 mg | ORAL_TABLET | Freq: Four times a day (QID) | ORAL | 1 refills | Status: DC | PRN
Start: 2023-03-31 — End: 2023-05-14
  Filled 2023-03-31 (×3): qty 30, 8d supply, fill #0

## 2023-03-31 MED ORDER — DEXAMETHASONE 2 MG PO TABS
2.0000 mg | ORAL_TABLET | Freq: Every day | ORAL | 0 refills | Status: DC
Start: 2023-03-31 — End: 2023-04-20
  Filled 2023-03-31 (×3): qty 5, 5d supply, fill #0

## 2023-03-31 MED ORDER — TEMOZOLOMIDE 250 MG PO CAPS
150.0000 mg/m2/d | ORAL_CAPSULE | Freq: Every day | ORAL | 0 refills | Status: DC
Start: 2023-03-31 — End: 2023-05-14
  Filled 2023-03-31: qty 5, 28d supply, fill #0
  Filled 2023-03-31: qty 5, 5d supply, fill #0
  Filled 2023-03-31: qty 5, 28d supply, fill #0

## 2023-03-31 NOTE — Telephone Encounter (Signed)
Oral Oncology Pharmacist Encounter  Received prescription for Temodar (temozolomide) for the treatment of glioblastoma, planned duration ~6 cycles (patient previously received 6 cycles, last cycle received 12/2022).  CBC w/ Diff and CMP from 03/31/23 assessed, noted patient with Scr of 1.29 mg/dL (CrCl ~25 mL/min) - no renal dose adjustments required. Confirmed with Dr. Barbaraann Cao patient will be on 150 mg/m2 of TMZ due to tolerance with higher dose of 200 mg/m2. Prescription dose and frequency assessed for appropriateness.  Current medication list in Epic reviewed, no relevant/significant DDIs with Temodar identified.  Evaluated chart and no patient barriers to medication adherence noted.   Patient agreement for treatment documented in MD note on 03/31/23.  Prescription has been e-scribed to the [facility] for benefits analysis and approval.  Oral Oncology Clinic will continue to follow for insurance authorization, copayment issues, initial counseling and start date.  Lenord Carbo, PharmD, BCPS, BCOP Hematology/Oncology Clinical Pharmacist [facility] Oral Chemotherapy Navigation Clinics [phone] 03/31/2023 2:48 PM

## 2023-03-31 NOTE — Progress Notes (Signed)
[facility] [address]  [place], [place] [address] [phone]   Interval Evaluation  Date of Service: 03/31/23 [name] Patient MRN: [id number] Patient DOB: August 18, 1956 Provider: Henreitta Leber, MD  Identifying Statement:  Jennifer [name] is a 67 y.o. female with left frontal glioblastoma   Oncologic History: Oncology History  Glioblastoma multiforme of frontal lobe (HCC)  04/11/2022 Surgery   Craniotomy, resection of left frontal mass with Dr. Franky Macho; path is glioblastoma IDHwt.   05/12/2022 -  Chemotherapy   Patient is on Treatment Plan : BRAIN GLIOBLASTOMA Radiation Therapy With Concurrent Temozolomide 75 mg/m2 Daily Followed By Sequential Maintenance Temozolomide x 6-12 cycles       Biomarkers:  MGMT Unknown.  IDH 1/2 Wild type.  EGFR Unknown  TERT Unknown   Interval History: Jennifer [name] presents today for follow up after recent MRI study.  She did have a seizure last month, 2-3 minutes of shaking, resolved and she returned to baseline.  She had been dehydrated and sleep deprived.  No other new neurologic symptoms.  Headaches remain improved.     H+P (05/05/22) Patient presented to neurologic attention in mid-August 2023 with new onset difficulty speaking, confusion noticed by family.  She and her family described sudden onset speech impairment and confusion in the afternoon of the 16th, after having been seen in normal state of health that morning.  CNS imaging demonstrated an enhancing left frontal mass, which was resected by Dr. Franky Macho on 04/11/22.  Following surgery she had some speech difficulty, but improved quickly through rehab admission.  At present, she is fully independent with gait, speech, ADLs.  She is off decadron and Keppra.  Retired, had worked at [employer] in [employer].  Today presents with son and daughter in law.    Medications: Current Outpatient Medications on File Prior to Visit   Medication Sig Dispense Refill   amLODipine (NORVASC) 5 MG tablet TAKE 1 TABLET BY MOUTH EVERY DAY 90 tablet 1   atorvastatin (LIPITOR) 10 MG tablet TAKE 1 TABLET BY MOUTH EVERY DAY (Patient taking differently: Take 10 mg by mouth daily.) 90 tablet 1   busPIRone (BUSPAR) 5 MG tablet TAKE 1 TABLET BY MOUTH THREE TIMES A DAY (Patient taking differently: Take 5 mg by mouth 3 (three) times daily.) 270 tablet 1   dexamethasone (DECADRON) 2 MG tablet Take 1 tablet (2 mg total) by mouth daily. (Patient taking differently: Take 2 mg by mouth daily. Takes when she is taking temodar) 5 tablet 0   fluticasone (FLONASE) 50 MCG/ACT nasal spray SPRAY 2 SPRAYS INTO EACH NOSTRIL EVERY DAY (Patient taking differently: Place 2 sprays into both nostrils daily.) 48 mL 0   losartan (COZAAR) 50 MG tablet TAKE 1 TABLET BY MOUTH EVERY DAY 90 tablet 1   meloxicam (MOBIC) 7.5 MG tablet Take 1 tablet by mouth 2 (two) times daily.     ondansetron (ZOFRAN) 8 MG tablet Take 1 tablet (8 mg total) by mouth every 8 (eight) hours as needed for nausea or vomiting. May take 30-60 minutes prior to Temodar administration if nausea/vomiting occurs as needed. 30 tablet 1   prochlorperazine (COMPAZINE) 10 MG tablet Take 1 tablet (10 mg total) by mouth every 6 (six) hours as needed for nausea or vomiting. 30 tablet 0   SUMAtriptan (IMITREX) 50 MG tablet Take 50 mg by mouth every 2 (two) hours as needed for migraine. May repeat in 2 hours if headache persists  or recurs.     temozolomide (TEMODAR) 250 MG capsule Take 1 capsule (250 mg total) by mouth daily. Take for 5 days on, 23 days off. Repeat every 28 days. May take on an empty stomach to decrease nausea & vomiting. 5 capsule 0   topiramate (TOPAMAX) 50 MG tablet TAKE 1 TABLET BY MOUTH TWICE A DAY (Patient taking differently: Take 50 mg by mouth 2 (two) times daily.) 60 tablet 0   traMADol (ULTRAM) 50 MG tablet Take 50 mg by mouth every 8 (eight) hours as needed.     traZODone (DESYREL) 50  MG tablet Take 1 tablet (50 mg total) by mouth at bedtime as needed for sleep. 30 tablet 2   No current facility-administered medications on file prior to visit.    Allergies: No Known Allergies Past Medical History:  Past Medical History:  Diagnosis Date   Brain cancer (HCC)    Hyperlipidemia    Hypertension    Migraine    Past Surgical History:  Past Surgical History:  Procedure Laterality Date   APPENDECTOMY     APPLICATION OF CRANIAL NAVIGATION N/A 04/11/2022   Procedure: APPLICATION OF CRANIAL NAVIGATION;  Surgeon: Coletta Memos, MD;  Location: [facility] OR;  Service: Neurosurgery;  Laterality: N/A;   BRAIN SURGERY     CRANIOTOMY Left 04/11/2022   Procedure: LEFT FRONTAL CRANIOTOMY FOR RESECTION OF TUMOR WITH [name];  Surgeon: Coletta Memos, MD;  Location: [facility] OR;  Service: Neurosurgery;  Laterality: Left;  RM 21   LAPAROSCOPIC APPENDECTOMY  07/15/2012   Procedure: APPENDECTOMY LAPAROSCOPIC;  Surgeon: Liz Malady, MD;  Location: [facility] OR;  Service: General;  Laterality: N/A;  Laparoscopic Appendectomy   Social History:  Social History   Socioeconomic History   Marital status: Widowed    Spouse name: Not on file   Number of children: 2   Years of education: Not on file   Highest education level: Not on file  Occupational History   Occupation: retail    Comment: [employer] in [place], [place]  Tobacco Use   Smoking status: Never   Smokeless tobacco: Never  Substance and Sexual Activity   Alcohol use: Yes    Comment: 2 drinks once every 2-3 months   Drug use: No   Sexual activity: Not on file  Other Topics Concern   Not on file  Social History Narrative   Marital status: widowed since 2008; husband was bipolar and alcoholic      Children: 2 children (28, 21); son lives with patient.Daughter lives in [place]/autism/does not drive.  Daughter lives with mother-in-law.  One grandpuppy      Lives: with son      Employment: works at [employer]; also works in Estate manager/land agent business.      Tobacco: none      Alcohol: none; husband was an alcoholic.      Drugs:   none      Exercise:   Social Determinants of Health   Financial Resource Strain: Low Risk  (02/26/2022)   Received from [facility]   Overall Financial Resource Strain (CARDIA)  Food Insecurity: No Food Insecurity (02/26/2022)   Received from [facility]   Hunger Vital Sign  Transportation Needs: No Transportation Needs (02/26/2022)   Received from [facility] - Transportation  Physical Activity: Insufficiently Active (02/26/2022)   Received from [facility]   Exercise Vital Sign  Stress: Stress Concern Present (02/26/2022)   Received from [facility]   [employer] of Occupational  Health - Occupational Stress Questionnaire  Social Connections: Socially Isolated (02/26/2022)   Received from [facility]   Social Connection and Isolation Panel [NHANES]  Intimate Partner Violence: Not At Risk (02/26/2022)   Received from [facility] visits prior to 10/25/2022., [facility] visits prior to 10/25/2022.   Humiliation, Afraid, Rape, and Kick questionnaire    Fear of Current or Ex-Partner: No    Emotionally Abused: No    Physically Abused: No    Sexually Abused: No   Family History:  Family History  Adopted: Yes    Review of Systems: Constitutional: Doesn't report fevers, chills or abnormal weight loss Eyes: Doesn't report blurriness of vision Ears, nose, mouth, throat, and face: Doesn't report sore throat Respiratory: Doesn't report cough, dyspnea or wheezes Cardiovascular: Doesn't report palpitation, chest discomfort  Gastrointestinal:  Doesn't report nausea, constipation, diarrhea GU: Doesn't report incontinence Skin: Doesn't report skin rashes Neurological: Per HPI Musculoskeletal: Doesn't report joint pain Behavioral/Psych: Doesn't report anxiety  Physical Exam: Vitals:   03/31/23 1135  BP: (!) 151/70  Pulse: (!) 104   Resp: 17  Temp: 98.4 F (36.9 C)  SpO2: 99%   KPS: 80 ECOG: 1 General: Alert, cooperative, pleasant, in no acute distress Head: Normal EENT: No conjunctival injection or scleral icterus.  Lungs: Resp effort normal Cardiac: Regular rate Abdomen: Non-distended abdomen Skin: No rashes cyanosis or petechiae. Extremities: No clubbing or edema  Neurologic Exam: Mental Status: Awake, alert, attentive to examiner. Oriented to self and environment. Language is fluent with intact comprehension.  Pseudobulbar elements. Cranial Nerves: Visual acuity is grossly normal. Visual fields are full. Extra-ocular movements intact. No ptosis. Face is symmetric Motor: Tone and bulk are normal. Power is full in both arms and legs. Reflexes are symmetric, no pathologic reflexes present.  Sensory: Intact to light touch Gait: Normal.   Labs: I have reviewed the data as listed    Component Value Date/Time   NA 139 02/25/2023 1800   NA 140 10/04/2018 1703   K 3.8 02/25/2023 1800   CL 110 02/25/2023 1800   CO2 18 (L) 02/25/2023 1800   GLUCOSE 83 02/25/2023 1800   BUN 31 (H) 02/25/2023 1800   BUN 24 10/04/2018 1703   CREATININE 1.33 (H) 02/25/2023 1800   CREATININE 1.33 (H) 01/27/2023 1123   CREATININE 1.06 (H) 01/29/2016 1518   CALCIUM 8.6 (L) 02/25/2023 1800   PROT 6.7 01/27/2023 1123   PROT 6.8 10/04/2018 1703   ALBUMIN 4.2 01/27/2023 1123   ALBUMIN 4.6 10/04/2018 1703   AST 19 01/27/2023 1123   ALT 18 01/27/2023 1123   ALKPHOS 62 01/27/2023 1123   BILITOT 0.5 01/27/2023 1123   GFRNONAA 44 (L) 02/25/2023 1800   GFRNONAA 44 (L) 01/27/2023 1123   GFRNONAA 58 (L) 01/29/2016 1518   GFRAA 70 10/04/2018 1703   GFRAA 67 01/29/2016 1518   Lab Results  Component Value Date   WBC 8.2 03/31/2023   NEUTROABS 6.7 03/31/2023   HGB 13.7 03/31/2023   HCT 38.3 03/31/2023   MCV 98.7 03/31/2023   PLT 168 03/31/2023    Imaging:  [facility] Clinician Interpretation: I have personally reviewed the CNS  images as listed.  My interpretation, in the context of the patient's clinical presentation, is progressive disease pending official read  No results found.   Assessment/Plan Glioblastoma multiforme of frontal lobe (HCC)  KAMORIA VITTITOW is clinically stable today after transitioning to observation following 6 cycles of 5-day Temodar.  MRI  demonstrates progressive findings with increase in volume of enhancement within left frontal lobe, official read is pending.  We recommended resuming treatment with cycle #7 Temozolomide 200 mg/m2, on for five days and off for twenty three days in twenty eight day cycles. The patient will have a complete blood count performed on days 21 and 28 of each cycle, and a comprehensive metabolic panel performed on day 28 of each cycle. Labs may need to be performed more often. Zofran will prescribed for home use for nausea/vomiting.   Informed consent was obtained verbally at bedside to proceed with oral chemotherapy.  Chemotherapy should be held for the following:  ANC less than 1,000  Platelets less than 100,000  LFT or creatinine greater than 2x ULN  If clinical concerns/contraindications develop  Given provoked nature of recent seizures, we recommended she continue Topamax 50mg  BID for migraine and seizure prevention.  Can increase to 100/50 if it recurs spontaneously.  May con't Trazodone 50mg  HS for insomnia.  We ask that Jennifer [name] return to clinic in 1 months with MRI brain for evaluation, or sooner as needed.  If further progression is seen, could consider re-resection.  Case will be discussed in CNS tumor board next week.  All questions were answered. The patient knows to call the clinic with any problems, questions or concerns. No barriers to learning were detected.  The total time spent in the encounter was 40 minutes and more than 50% was on counseling and review of test results   Henreitta Leber, MD Medical Director of  Neuro-Oncology [facility] 03/31/23 11:35 AM

## 2023-03-31 NOTE — Telephone Encounter (Signed)
Oral Chemotherapy Pharmacist Encounter  I spoke with patient for review of: Temodar (temozolomide) for the maintenance treatment of glioblastoma multiforme, planned duration 6-12 months of treatment.  Counseled on administration, dosing, side effects, monitoring, drug-food interactions, safe handling, storage, and disposal.  Patient will take Temodar 250mg  capsules, 1 capsule by mouth once daily, may take at bedtime and on an empty stomach to decrease nausea and vomiting.  Patient will take Temodar daily for 5 days on, 23 days off, and repeated.  Temodar re-initiation date: 04/02/23 PM    Adverse effects include but are not limited to: nausea, vomiting, GI upset, rash, and fatigue. Nausea/Vomiting PPX: Patient will take compazine 10mg  tablet and, 1 tablet by mouth 30-60 min prior to Temodar dose to help decrease N/V. She will also take the dexamethasone 2 mg tablet on the 5 days of treatment to help with N/V.   Reviewed importance of keeping a medication schedule and plan for any missed doses. No barriers to medication adherence identified.  Medication reconciliation performed and medication/allergy list updated.  Insurance authorization for Temodar has been obtained. This will ship from the [facility] outpatient pharmacy on 04/01/23 to deliver to patient's home on 04/02/23.  Patient informed the pharmacy will reach out 5-7 days prior to needing next fill of Temodar to coordinate continued medication acquisition to prevent break in therapy.  All questions answered.  [name] voiced understanding and appreciation.   Medication education handout placed in mail for patient. Patient knows to call the office with questions or concerns. Oral Chemotherapy Clinic phone number provided to patient.   Lenord Carbo, PharmD, BCPS Hematology/Oncology Clinical Pharmacist [facility] Oral Chemotherapy Navigation Clinic [phone] 03/31/2023 3:17 PM

## 2023-04-01 ENCOUNTER — Other Ambulatory Visit: Payer: Self-pay

## 2023-04-01 ENCOUNTER — Other Ambulatory Visit (HOSPITAL_COMMUNITY): Payer: Self-pay

## 2023-04-03 ENCOUNTER — Other Ambulatory Visit: Payer: Self-pay

## 2023-04-03 ENCOUNTER — Telehealth: Payer: Self-pay | Admitting: Internal Medicine

## 2023-04-03 NOTE — Telephone Encounter (Signed)
Scheduled per 08/06 los, patient has been called and voicemail was left.

## 2023-04-12 ENCOUNTER — Other Ambulatory Visit: Payer: Self-pay

## 2023-04-12 ENCOUNTER — Emergency Department (HOSPITAL_COMMUNITY)
Admission: EM | Admit: 2023-04-12 | Discharge: 2023-04-12 | Disposition: A | Discharge status: Home / Self Care | Payer: Medicare Other | Attending: Emergency Medicine | Admitting: Emergency Medicine

## 2023-04-12 ENCOUNTER — Emergency Department (HOSPITAL_COMMUNITY): Payer: Medicare Other

## 2023-04-12 DIAGNOSIS — R569 Unspecified convulsions: Secondary | ICD-10-CM | POA: Diagnosis present

## 2023-04-12 LAB — CBG MONITORING, ED: Glucose-Capillary: 131 mg/dL — ABNORMAL HIGH (ref 70–99)

## 2023-04-12 LAB — CBC
HCT: 39.6 % (ref 36.0–46.0)
Hemoglobin: 12.2 g/dL (ref 12.0–15.0)
MCH: 33.7 pg (ref 26.0–34.0)
MCHC: 30.8 g/dL (ref 30.0–36.0)
MCV: 109.4 fL — ABNORMAL HIGH (ref 80.0–100.0)
Platelets: 148 10*3/uL — ABNORMAL LOW (ref 150–400)
RBC: 3.62 MIL/uL — ABNORMAL LOW (ref 3.87–5.11)
RDW: 11.9 % (ref 11.5–15.5)
WBC: 8 10*3/uL (ref 4.0–10.5)
nRBC: 0 % (ref 0.0–0.2)

## 2023-04-12 LAB — BASIC METABOLIC PANEL
Anion gap: 11 (ref 5–15)
BUN: 17 mg/dL (ref 8–23)
CO2: 15 mmol/L — ABNORMAL LOW (ref 22–32)
Calcium: 6.9 mg/dL — ABNORMAL LOW (ref 8.9–10.3)
Chloride: 117 mmol/L — ABNORMAL HIGH (ref 98–111)
Creatinine, Ser: 0.98 mg/dL (ref 0.44–1.00)
GFR, Estimated: >60 mL/min (ref >60)
Glucose, Bld: 104 mg/dL — ABNORMAL HIGH (ref 70–99)
Potassium: 3.2 mmol/L — ABNORMAL LOW (ref 3.5–5.1)
Sodium: 143 mmol/L (ref 135–145)

## 2023-04-12 MED ORDER — POTASSIUM CHLORIDE 20 MEQ PO PACK
40.0000 meq | PACK | Freq: Once | ORAL | Status: AC
  Administered 2023-04-12: 40 meq via ORAL
  Filled 2023-04-12: qty 2

## 2023-04-12 MED ORDER — LEVETIRACETAM 500 MG PO TABS
500.0000 mg | ORAL_TABLET | Freq: Once | ORAL | Status: AC
  Administered 2023-04-12: 500 mg via ORAL
  Filled 2023-04-12: qty 1

## 2023-04-12 MED ORDER — LEVETIRACETAM 500 MG PO TABS
500.0000 mg | ORAL_TABLET | Freq: Two times a day (BID) | ORAL | 0 refills | Status: DC
  Filled 2023-04-12 – 2023-04-13 (×2): qty 30, 15d supply, fill #0

## 2023-04-12 NOTE — ED Provider Notes (Signed)
[place] EMERGENCY DEPARTMENT AT [facility] Provider Note   CSN: [id number] Arrival date & time: 04/12/23  1843     History Chief Complaint  Patient presents with   Seizures    HPI Jennifer [name] is a 67 y.o. female presenting for seizure episode.  First seizure in a year.  History of intracranial mass.  Follows with Dr. Barbaraann Cao of neuro-oncology.  Has been only on Topamax for seizure prophylaxis as she has a longstanding history of headaches and has worked for years.  She has about 1 seizure per year.  She is back to her baseline.  States that shaking episode lasted approximately a minute.  Denies fevers chills nausea vomiting syncope shortness of breath or prodromes.   Patient's recorded medical, surgical, social, medication list and allergies were reviewed in the Snapshot window as part of the initial history.   Review of Systems   Review of Systems  Constitutional:  Negative for chills and fever.  HENT:  Negative for ear pain and sore throat.   Eyes:  Negative for pain and visual disturbance.  Respiratory:  Negative for cough and shortness of breath.   Cardiovascular:  Negative for chest pain and palpitations.  Gastrointestinal:  Negative for abdominal pain and vomiting.  Genitourinary:  Negative for dysuria and hematuria.  Musculoskeletal:  Negative for arthralgias and back pain.  Skin:  Negative for color change and rash.  Neurological:  Positive for seizures. Negative for syncope.  All other systems reviewed and are negative.   Physical Exam Updated Vital Signs BP 126/82 (BP Location: Left Arm)   Pulse 89   Temp 98.3 F (36.8 C) (Oral)   Resp 16   SpO2 100%  Physical Exam Vitals and nursing note reviewed.  Constitutional:      General: She is not in acute distress.    Appearance: She is well-developed.  HENT:     Head: Normocephalic and atraumatic.  Eyes:     Conjunctiva/sclera: Conjunctivae normal.  Cardiovascular:     Rate and Rhythm: Normal  rate and regular rhythm.     Heart sounds: No murmur heard. Pulmonary:     Effort: Pulmonary effort is normal. No respiratory distress.     Breath sounds: Normal breath sounds.  Abdominal:     General: There is no distension.     Palpations: Abdomen is soft.     Tenderness: There is no abdominal tenderness. There is no right CVA tenderness or left CVA tenderness.  Musculoskeletal:        General: No swelling or tenderness. Normal range of motion.     Cervical back: Neck supple.  Skin:    General: Skin is warm and dry.  Neurological:     General: No focal deficit present.     Mental Status: She is alert and oriented to person, place, and time. Mental status is at baseline.     Cranial Nerves: No cranial nerve deficit.      ED Course/ Medical Decision Making/ A&P    Procedures Procedures   Medications Ordered in ED Medications  potassium chloride (KLOR-CON) packet 40 mEq (40 mEq Oral Given 04/12/23 2220)  levETIRAcetam (KEPPRA) tablet 500 mg (500 mg Oral Given 04/12/23 2220)    Medical Decision Making:    [name] is a 67 y.o. female who presented to the ED today with seizure episode detailed above.     Complete initial physical exam performed, notably the patient  was hemodynamically stable no acute distress.  Reviewed and confirmed nursing documentation for past medical history, family history, social history.    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 Other diagnoses were considered including (but not limited to) hemorrhagic conversion, intracranial infection, metabolic disruption. These are considered less likely due to history of present illness and physical exam findings.   This is most consistent with an acute life/limb threatening illness complicated by underlying chronic conditions.  Initial Plan:  CT head to evaluate for  structural intracranial pathology such as hemorrhagic conversion Screening labs including CBC and Metabolic panel to evaluate for infectious or metabolic etiology of disease.  Objective evaluation as below reviewed with plan for close reassessment  Initial Study Results:   Laboratory  All laboratory results reviewed without evidence of clinically relevant pathology.   Exceptions include: Mild hypokalemia repleted in the emergency room Radiology  All images reviewed independently. Agree with radiology report at this time.   CT HEAD WO CONTRAST ( )  Result Date: 04/12/2023 CLINICAL DATA:  Trauma. History of glioblastoma of the frontal lobe. EXAM: CT HEAD WITHOUT CONTRAST TECHNIQUE: Contiguous axial images were obtained from the base of the skull through the vertex without intravenous contrast. RADIATION DOSE REDUCTION: This exam was performed according to the departmental dose-optimization program which includes automated exposure control, adjustment of the mA and/or kV according to patient size and/or use of iterative reconstruction technique. COMPARISON:  Head CT 02/25/2023.  MRI head 03/26/2023. FINDINGS: Brain: Vasogenic edema in the bilateral frontal lobes, left greater than right appear similar to MRI given differences in technique. Left frontal lobe tumor margins are not well delineated on this noncontrast study. There is no acute intracranial hemorrhage, mass effect or midline shift. There is no hydrocephalus. Brain volume is age-appropriate. Vascular: No hyperdense vessel or unexpected calcification. Skull: No acute fractures.  Left frontal craniotomy again seen. Sinuses/Orbits: No acute finding. Other: None. IMPRESSION: 1. No acute intracranial hemorrhage or calvarial fracture. 2. Vasogenic edema in the bilateral frontal lobes appears similar to MRI given differences in technique. Left frontal lobe tumor margins are not well delineated on this noncontrast study. Electronically Signed   By: Darliss Cheney M.D.   On: 04/12/2023 20:13     Consults:  Case discussed with neurology.   Reassessment and Plan:   Discussed with neurology.  Will start patient on Keppra 500 mg twice daily for 30 days with plan for them to follow-up with her normal neurologist in the next 5 days.  Given prolonged serial observations with normal neurologic status no further breakthrough seizures, patient stable for outpatient care management.   Disposition:  I have considered need for hospitalization, however, considering all of the above, I believe this patient is stable for discharge at this time.  Patient/family educated about specific return precautions for given chief complaint and symptoms.  Patient/family educated about follow-up with PCP .     Patient/family expressed understanding of return precautions and need for follow-up. Patient spoken to regarding all imaging and laboratory results and appropriate follow up for these results. All education provided in verbal form with additional information in written form. Time was allowed for answering of patient questions. Patient discharged.    Emergency Department Medication Summary:   Medications  potassium chloride (KLOR-CON) packet 40 mEq (40 mEq Oral Given 04/12/23 2220)  levETIRAcetam (KEPPRA) tablet 500 mg (500 mg Oral Given 04/12/23 2220)  Clinical Impression:  1. Seizure [facility])      Discharge   Final Clinical Impression(s) / ED Diagnoses Final diagnoses:  Seizure [facility])    Rx / DC Orders ED Discharge Orders          Ordered    levETIRAcetam (KEPPRA) 500 MG tablet  2 times daily        04/12/23 2206              Glyn Ade, MD 04/12/23 2250

## 2023-04-12 NOTE — ED Triage Notes (Signed)
Patient BIB [facility] home. Patient was Dx. With Brain tumor. EMS called due to seizure activity after patient unable to move right sided. Once EMS arrived symptoms resolved. Not on any seizure medication. 152/69, 108, 14, 97%ra. CBG 91. 98.3.

## 2023-04-13 ENCOUNTER — Other Ambulatory Visit (HOSPITAL_COMMUNITY): Payer: Self-pay

## 2023-04-13 ENCOUNTER — Telehealth: Payer: Self-pay | Admitting: *Deleted

## 2023-04-13 NOTE — Telephone Encounter (Signed)
Patient called to report visit to ED yesterday for seizure while at home.  Loss of feeling on one side while sitting in her chair at home.  Recalls seizure.  Received Keppra loading dose in ER and sent home with new Rx for Keppra 500 BID to take in addition to her long term medication of Topamax.    Patient had left voice mail initially and was repeative mulitple times on message.    Per Dr Barbaraann Cao ok with new Rx and states that keep MRI as planned.  Speech and energy should get better after postictal phase.

## 2023-04-20 ENCOUNTER — Other Ambulatory Visit: Payer: Self-pay

## 2023-04-20 ENCOUNTER — Telehealth: Payer: Self-pay | Admitting: *Deleted

## 2023-04-20 ENCOUNTER — Other Ambulatory Visit (HOSPITAL_COMMUNITY): Payer: Self-pay

## 2023-04-20 ENCOUNTER — Other Ambulatory Visit: Payer: Self-pay | Admitting: Internal Medicine

## 2023-04-20 ENCOUNTER — Other Ambulatory Visit: Payer: Self-pay | Admitting: *Deleted

## 2023-04-20 DIAGNOSIS — C711 Malignant neoplasm of frontal lobe: Secondary | ICD-10-CM

## 2023-04-20 MED ORDER — DEXAMETHASONE 2 MG PO TABS: 2.0000 mg | ORAL_TABLET | Freq: Every day | ORAL | 0 refills | Status: DC

## 2023-04-20 NOTE — Telephone Encounter (Signed)
Patient will have labs and see provider prior to starting oral chemo.  Appointment already lined up.

## 2023-04-20 NOTE — Telephone Encounter (Signed)
Spoke with Jennifer [name] confirmed patient is not dosing any decadron (only when she is on chemo).  Had none on hand so I ordered a 30 day supply of Decadron 2 mg daily per Dr Liana Gerold order.  She stated understanding.

## 2023-04-20 NOTE — Telephone Encounter (Signed)
Received call from Jennifer [name] (daughter in law).  She states that since Jennifer [name] started the Keppra she has been rapidly declining.  Struggling with ability to walk/lethargy/ forming complete sentences.   They are seeking advice.   Patient is already scheduled to have MRI in 3 days.    Routing to Dr Barbaraann Cao to please advise how to proceed.

## 2023-04-21 ENCOUNTER — Encounter: Payer: Self-pay | Admitting: Internal Medicine

## 2023-04-23 ENCOUNTER — Encounter (HOSPITAL_COMMUNITY): Payer: Self-pay | Admitting: Emergency Medicine

## 2023-04-23 ENCOUNTER — Emergency Department (HOSPITAL_COMMUNITY): Payer: Medicare Other

## 2023-04-23 ENCOUNTER — Ambulatory Visit (HOSPITAL_COMMUNITY): Payer: Medicare Other

## 2023-04-23 ENCOUNTER — Encounter (HOSPITAL_COMMUNITY): Payer: Self-pay

## 2023-04-23 ENCOUNTER — Inpatient Hospital Stay (HOSPITAL_COMMUNITY)
Admission: EM | Admit: 2023-04-23 | Discharge: 2023-04-29 | DRG: 054 | Disposition: A | Discharge status: Home Health | Payer: Medicare Other | Attending: Internal Medicine | Admitting: Internal Medicine

## 2023-04-23 ENCOUNTER — Telehealth: Payer: Self-pay | Admitting: *Deleted

## 2023-04-23 DIAGNOSIS — G40909 Epilepsy, unspecified, not intractable, without status epilepticus: Secondary | ICD-10-CM | POA: Diagnosis present

## 2023-04-23 DIAGNOSIS — E872 Acidosis, unspecified: Secondary | ICD-10-CM | POA: Diagnosis present

## 2023-04-23 DIAGNOSIS — G9341 Metabolic encephalopathy: Secondary | ICD-10-CM | POA: Diagnosis present

## 2023-04-23 DIAGNOSIS — C711 Malignant neoplasm of frontal lobe: Principal | ICD-10-CM | POA: Diagnosis present

## 2023-04-23 DIAGNOSIS — Z66 Do not resuscitate: Secondary | ICD-10-CM | POA: Diagnosis present

## 2023-04-23 DIAGNOSIS — G43909 Migraine, unspecified, not intractable, without status migrainosus: Secondary | ICD-10-CM | POA: Diagnosis present

## 2023-04-23 DIAGNOSIS — G8191 Hemiplegia, unspecified affecting right dominant side: Secondary | ICD-10-CM | POA: Diagnosis present

## 2023-04-23 DIAGNOSIS — E876 Hypokalemia: Secondary | ICD-10-CM | POA: Diagnosis not present

## 2023-04-23 DIAGNOSIS — R4702 Dysphasia: Secondary | ICD-10-CM | POA: Diagnosis present

## 2023-04-23 DIAGNOSIS — Z79630 Long term (current) use of alkylating agent: Secondary | ICD-10-CM | POA: Diagnosis not present

## 2023-04-23 DIAGNOSIS — C719 Malignant neoplasm of brain, unspecified: Secondary | ICD-10-CM

## 2023-04-23 DIAGNOSIS — R531 Weakness: Secondary | ICD-10-CM | POA: Diagnosis not present

## 2023-04-23 DIAGNOSIS — N179 Acute kidney failure, unspecified: Secondary | ICD-10-CM | POA: Diagnosis present

## 2023-04-23 DIAGNOSIS — E785 Hyperlipidemia, unspecified: Secondary | ICD-10-CM | POA: Diagnosis present

## 2023-04-23 DIAGNOSIS — Z79899 Other long term (current) drug therapy: Secondary | ICD-10-CM | POA: Diagnosis not present

## 2023-04-23 DIAGNOSIS — I10 Essential (primary) hypertension: Secondary | ICD-10-CM | POA: Diagnosis present

## 2023-04-23 DIAGNOSIS — R4701 Aphasia: Secondary | ICD-10-CM | POA: Diagnosis present

## 2023-04-23 DIAGNOSIS — G936 Cerebral edema: Secondary | ICD-10-CM | POA: Diagnosis present

## 2023-04-23 DIAGNOSIS — Z791 Long term (current) use of non-steroidal anti-inflammatories (NSAID): Secondary | ICD-10-CM | POA: Diagnosis not present

## 2023-04-23 DIAGNOSIS — R29898 Other symptoms and signs involving the musculoskeletal system: Secondary | ICD-10-CM | POA: Diagnosis not present

## 2023-04-23 DIAGNOSIS — R569 Unspecified convulsions: Secondary | ICD-10-CM | POA: Diagnosis not present

## 2023-04-23 DIAGNOSIS — Z9049 Acquired absence of other specified parts of digestive tract: Secondary | ICD-10-CM

## 2023-04-23 LAB — APTT: aPTT: 28 seconds (ref 24–36)

## 2023-04-23 LAB — CBC WITH DIFFERENTIAL/PLATELET
Abs Immature Granulocytes: 0.04 10*3/uL (ref 0.00–0.07)
Basophils Absolute: 0 10*3/uL (ref 0.0–0.1)
Basophils Relative: 0 %
Eosinophils Absolute: 0 10*3/uL (ref 0.0–0.5)
Eosinophils Relative: 0 %
HCT: 39 % (ref 36.0–46.0)
Hemoglobin: 13.5 g/dL (ref 12.0–15.0)
Immature Granulocytes: 0 %
Lymphocytes Relative: 5 %
Lymphs Abs: 0.5 10*3/uL — ABNORMAL LOW (ref 0.7–4.0)
MCH: 34.1 pg — ABNORMAL HIGH (ref 26.0–34.0)
MCHC: 34.6 g/dL (ref 30.0–36.0)
MCV: 98.5 fL (ref 80.0–100.0)
Monocytes Absolute: 0.3 10*3/uL (ref 0.1–1.0)
Monocytes Relative: 3 %
Neutro Abs: 8.1 10*3/uL — ABNORMAL HIGH (ref 1.7–7.7)
Neutrophils Relative %: 92 %
Platelets: 195 10*3/uL (ref 150–400)
RBC: 3.96 MIL/uL (ref 3.87–5.11)
RDW: 12.1 % (ref 11.5–15.5)
WBC: 9 10*3/uL (ref 4.0–10.5)
nRBC: 0 % (ref 0.0–0.2)

## 2023-04-23 LAB — COMPREHENSIVE METABOLIC PANEL
ALT: 21 U/L (ref 0–44)
AST: 19 U/L (ref 15–41)
Albumin: 3.8 g/dL (ref 3.5–5.0)
Alkaline Phosphatase: 62 U/L (ref 38–126)
Anion gap: 8 (ref 5–15)
BUN: 27 mg/dL — ABNORMAL HIGH (ref 8–23)
CO2: 20 mmol/L — ABNORMAL LOW (ref 22–32)
Calcium: 9 mg/dL (ref 8.9–10.3)
Chloride: 112 mmol/L — ABNORMAL HIGH (ref 98–111)
Creatinine, Ser: 1.01 mg/dL — ABNORMAL HIGH (ref 0.44–1.00)
GFR, Estimated: >60 mL/min (ref >60)
Glucose, Bld: 127 mg/dL — ABNORMAL HIGH (ref 70–99)
Potassium: 4 mmol/L (ref 3.5–5.1)
Sodium: 140 mmol/L (ref 135–145)
Total Bilirubin: 0.5 mg/dL (ref 0.3–1.2)
Total Protein: 6.6 g/dL (ref 6.5–8.1)

## 2023-04-23 LAB — PROTIME-INR
INR: 1 (ref 0.8–1.2)
Prothrombin Time: 12.9 seconds (ref 11.4–15.2)

## 2023-04-23 LAB — RAPID URINE DRUG SCREEN, HOSP PERFORMED
Amphetamines: NOT DETECTED
Barbiturates: NOT DETECTED
Benzodiazepines: NOT DETECTED
Cocaine: NOT DETECTED
Opiates: NOT DETECTED
Tetrahydrocannabinol: NOT DETECTED

## 2023-04-23 LAB — ETHANOL: Alcohol, Ethyl (B): <10 mg/dL (ref <10)

## 2023-04-23 LAB — I-STAT CG4 LACTIC ACID, ED: Lactic Acid, Venous: 1.6 mmol/L (ref 0.5–1.9)

## 2023-04-23 MED ORDER — ATORVASTATIN CALCIUM 10 MG PO TABS
10.0000 mg | ORAL_TABLET | Freq: Every day | ORAL | Status: DC
  Administered 2023-04-24 – 2023-04-29 (×6): 10 mg via ORAL
  Filled 2023-04-23 (×6): qty 1

## 2023-04-23 MED ORDER — ACETAMINOPHEN 325 MG PO TABS
650.0000 mg | ORAL_TABLET | ORAL | Status: DC | PRN
  Filled 2023-04-23: qty 2

## 2023-04-23 MED ORDER — GADOBUTROL 1 MMOL/ML IV SOLN
7.0000 mL | Freq: Once | INTRAVENOUS | Status: AC | PRN
  Administered 2023-04-23: 7 mL via INTRAVENOUS

## 2023-04-23 MED ORDER — ENOXAPARIN SODIUM 40 MG/0.4ML IJ SOSY
40.0000 mg | PREFILLED_SYRINGE | INTRAMUSCULAR | Status: DC
  Administered 2023-04-23 – 2023-04-28 (×6): 40 mg via SUBCUTANEOUS
  Filled 2023-04-23 (×6): qty 0.4

## 2023-04-23 MED ORDER — TOPIRAMATE 25 MG PO TABS
50.0000 mg | ORAL_TABLET | Freq: Two times a day (BID) | ORAL | Status: DC
  Administered 2023-04-23 – 2023-04-29 (×12): 50 mg via ORAL
  Filled 2023-04-23 (×12): qty 2

## 2023-04-23 MED ORDER — ACETAMINOPHEN 160 MG/5ML PO SOLN
650.0000 mg | ORAL | Status: DC | PRN
  Administered 2023-04-24: 650 mg
  Filled 2023-04-23: qty 20.3

## 2023-04-23 MED ORDER — LOSARTAN POTASSIUM 50 MG PO TABS
50.0000 mg | ORAL_TABLET | Freq: Every day | ORAL | Status: DC
  Administered 2023-04-24 – 2023-04-29 (×6): 50 mg via ORAL
  Filled 2023-04-23 (×6): qty 1

## 2023-04-23 MED ORDER — SENNOSIDES-DOCUSATE SODIUM 8.6-50 MG PO TABS
1.0000 | ORAL_TABLET | Freq: Every evening | ORAL | Status: DC | PRN
  Administered 2023-04-26: 1 via ORAL
  Filled 2023-04-23: qty 1

## 2023-04-23 MED ORDER — LEVETIRACETAM 500 MG PO TABS
500.0000 mg | ORAL_TABLET | Freq: Two times a day (BID) | ORAL | Status: DC
  Administered 2023-04-23 – 2023-04-24 (×2): 500 mg via ORAL
  Filled 2023-04-23 (×2): qty 1

## 2023-04-23 MED ORDER — TRAZODONE HCL 50 MG PO TABS: 50.0000 mg | ORAL_TABLET | Freq: Every evening | ORAL | Status: DC | PRN

## 2023-04-23 MED ORDER — AMLODIPINE BESYLATE 5 MG PO TABS
5.0000 mg | ORAL_TABLET | Freq: Every day | ORAL | Status: DC
  Administered 2023-04-24 – 2023-04-29 (×6): 5 mg via ORAL
  Filled 2023-04-23 (×6): qty 1

## 2023-04-23 MED ORDER — TEMOZOLOMIDE 5 MG PO CAPS
250.0000 mg | ORAL_CAPSULE | Freq: Every day | ORAL | Status: DC
  Filled 2023-04-23: qty 3

## 2023-04-23 MED ORDER — TRAZODONE HCL 50 MG PO TABS: 25.0000 mg | ORAL_TABLET | Freq: Every evening | ORAL | Status: DC | PRN

## 2023-04-23 MED ORDER — STROKE: EARLY STAGES OF RECOVERY BOOK
Freq: Once | Status: AC
  Filled 2023-04-23 (×2): qty 1

## 2023-04-23 MED ORDER — ONDANSETRON HCL 4 MG/2ML IJ SOLN: 4.0000 mg | INTRAMUSCULAR | Status: DC | PRN

## 2023-04-23 MED ORDER — DEXAMETHASONE 2 MG PO TABS
2.0000 mg | ORAL_TABLET | Freq: Every day | ORAL | Status: DC
  Filled 2023-04-23: qty 1

## 2023-04-23 MED ORDER — FLUTICASONE PROPIONATE 50 MCG/ACT NA SUSP
2.0000 | Freq: Every day | NASAL | Status: DC
  Administered 2023-04-26 – 2023-04-29 (×4): 2 via NASAL
  Filled 2023-04-23 (×2): qty 16

## 2023-04-23 MED ORDER — DEXAMETHASONE SODIUM PHOSPHATE 10 MG/ML IJ SOLN
10.0000 mg | Freq: Once | INTRAMUSCULAR | Status: AC
  Administered 2023-04-23: 10 mg via INTRAVENOUS
  Filled 2023-04-23: qty 1

## 2023-04-23 MED ORDER — ACETAMINOPHEN 650 MG RE SUPP: 650.0000 mg | RECTAL | Status: DC | PRN

## 2023-04-23 MED ORDER — BUSPIRONE HCL 10 MG PO TABS
5.0000 mg | ORAL_TABLET | Freq: Three times a day (TID) | ORAL | Status: DC
  Administered 2023-04-23 – 2023-04-29 (×17): 5 mg via ORAL
  Filled 2023-04-23 (×17): qty 1

## 2023-04-23 MED ORDER — SUMATRIPTAN SUCCINATE 50 MG PO TABS
50.0000 mg | ORAL_TABLET | ORAL | Status: DC | PRN
  Filled 2023-04-23: qty 1

## 2023-04-23 MED ORDER — ONDANSETRON HCL 4 MG PO TABS: 8.0000 mg | ORAL_TABLET | Freq: Three times a day (TID) | ORAL | Status: DC | PRN

## 2023-04-23 MED ORDER — DEXAMETHASONE SODIUM PHOSPHATE 4 MG/ML IJ SOLN
4.0000 mg | Freq: Two times a day (BID) | INTRAMUSCULAR | Status: DC
  Administered 2023-04-24 – 2023-04-26 (×5): 4 mg via INTRAVENOUS
  Filled 2023-04-23 (×5): qty 1

## 2023-04-23 MED ORDER — SODIUM CHLORIDE 0.9 % IV SOLN: INTRAVENOUS | Status: DC

## 2023-04-23 MED ORDER — PROCHLORPERAZINE MALEATE 10 MG PO TABS: 10.0000 mg | ORAL_TABLET | Freq: Four times a day (QID) | ORAL | Status: DC | PRN

## 2023-04-23 NOTE — H&P (Signed)
[place]   [name]    MR#:  [id number]  DATE OF BIRTH:  Jun 04, 1956  DATE OF ADMISSION:  04/23/2023  PRIMARY CARE PHYSICIAN: Judd Lien, PA-C   Patient is coming from: Home  REQUESTING/REFERRING PHYSICIAN: Chaney Malling, MD  CHIEF COMPLAINT:   Chief Complaint  Patient presents with   Weakness    HISTORY OF PRESENT ILLNESS:  Jennifer [name] is a 67 y.o. 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  She was seen for new onset seizures on 8/18 and was sent home Keppra and follow off with oncology.  Her oncologist ordered brain MRI with and without contrast which has not been completed.  He came to the ER.  Family thought that she was not stable to go to MRI.  She was able to walk out of the department last time.  Over the last couple weeks she has been having weakness and inability to lift the right arm off of the table and today presents with left upper extremity weakness as well.  She has been having dysarthria and expressive dysphasia.  No headache or visual supervision.  No tinnitus or vertigo.  No presyncope or syncope. No fever or chills.  No chest pain or palpitations.  No cough or wheezing or dyspnea.  No bleeding diathesis.  ED Course: When she came to the ER vital signs were within normal.  Labs revealed a BUN of 27 and creatinine of 1 with CT 20 and otherwise unremarkable CMP.  CBC within normal.  Next Kesty was 1.6.  Chloride profile is normal.  Alcohol levels). EKG as reviewed by me : EKG showed normal sinus rhythm with a rate of 83 with PVCs, supraventricular and ventricular, short PR interval with sinus pauses. Imaging: Noncontrast head CT scan revealed the following: 1. Continued vasogenic edema within the bilateral frontal lobes, left  greater than right, slightly more pronounced than prior study. Tumor margins within the left frontal lobe are poorly delineated on unenhanced CT. 2. No acute infarct or hemorrhage. Brain MRI with and without contrast revealed the following: Interval increase in size of contrast-enhancing tumor centered in the left frontal lobe, as well as the surrounding T2/FLAIR hyperintense signal abnormality which now extends posteriorly to the level of the postcentral gyrus. There is also new 3 mm rightward midline shift. The patient was given 10 mg of IV Decadron after neurology consultation over the phone with Dr. Selina Cooley.  She will be admitted to a progressive care bed at [facility] hospital for further evaluation and management.  PAST MEDICAL HISTORY:   Past Medical History:  Diagnosis Date   Brain cancer (HCC)    Hyperlipidemia    Hypertension    Migraine     PAST SURGICAL HISTORY:   Past Surgical History:  Procedure Laterality Date   APPENDECTOMY     APPLICATION OF CRANIAL NAVIGATION N/A 04/11/2022   Procedure: APPLICATION OF CRANIAL NAVIGATION;  Surgeon: Coletta Memos, MD;  Location: [facility] OR;  Service: Neurosurgery;  Laterality: N/A;   BRAIN SURGERY     CRANIOTOMY Left 04/11/2022   Procedure: LEFT FRONTAL CRANIOTOMY FOR RESECTION OF TUMOR WITH [name];  Surgeon: Coletta Memos, MD;  Location: [facility] OR;  Service: Neurosurgery;  Laterality: Left;  RM 21   LAPAROSCOPIC APPENDECTOMY  07/15/2012   Procedure: APPENDECTOMY LAPAROSCOPIC;  Surgeon:  Liz Malady, MD;  Location: [facility] OR;  Service: General;  Laterality: N/A;  Laparoscopic Appendectomy    SOCIAL HISTORY:   Social History   Tobacco Use   Smoking status: Never   Smokeless tobacco: Never  Substance Use Topics   Alcohol use: Yes    Comment: 2 drinks once every 2-3 months    FAMILY HISTORY:   Family History  Adopted: Yes    DRUG ALLERGIES:  No Known Allergies  REVIEW OF SYSTEMS:   ROS As per history of present illness. All  pertinent systems were reviewed above. Constitutional, HEENT, cardiovascular, respiratory, GI, GU, musculoskeletal, neuro, psychiatric, endocrine, integumentary and hematologic systems were reviewed and are otherwise negative/unremarkable except for positive findings mentioned above in the HPI.   MEDICATIONS AT HOME:   Prior to Admission medications   Medication Sig Start Date End Date Taking? Authorizing Provider  amLODipine (NORVASC) 5 MG tablet TAKE 1 TABLET BY MOUTH EVERY DAY Patient taking differently: Take 5 mg by mouth daily. 06/06/19  Yes Shade Flood, MD  atorvastatin (LIPITOR) 10 MG tablet TAKE 1 TABLET BY MOUTH EVERY DAY Patient taking differently: Take 10 mg by mouth daily. 11/10/19  Yes Shade Flood, MD  busPIRone (BUSPAR) 5 MG tablet TAKE 1 TABLET BY MOUTH THREE TIMES A DAY Patient taking differently: Take 5 mg by mouth 3 (three) times daily. 08/16/19  Yes Shade Flood, MD  dexamethasone (DECADRON) 2 MG tablet Take 1 tablet (2 mg total) by mouth daily. 04/20/23  Yes Vaslow, Georgeanna Lea, MD  levETIRAcetam (KEPPRA) 500 MG tablet Take 1 tablet (500 mg total) by mouth 2 (two) times daily. 04/12/23  Yes Countryman, Almeta Monas, MD  losartan (COZAAR) 50 MG tablet TAKE 1 TABLET BY MOUTH EVERY DAY Patient taking differently: Take 50 mg by mouth daily. 04/28/19  Yes Shade Flood, MD  meloxicam (MOBIC) 7.5 MG tablet Take 7.5 mg by mouth daily. 06/16/22  Yes [provider]  NEO-SYNEPHRINE COLD/ALLRGY EXT 1 % nasal spray Place 1 drop into both nostrils every 6 (six) hours as needed for congestion.   Yes [provider]  ondansetron (ZOFRAN) 8 MG tablet Take 1 tablet (8 mg total) by mouth every 8 (eight) hours as needed for nausea or vomiting. May take 30-60 minutes prior to Temodar administration if nausea/vomiting occurs as needed. 06/26/22  Yes Vaslow, Georgeanna Lea, MD  prochlorperazine (COMPAZINE) 10 MG tablet Take 1 tablet (10 mg total) by mouth every 6 (six) hours as  needed for nausea or vomiting. 03/31/23  Yes Vaslow, Georgeanna Lea, MD  SUMAtriptan (IMITREX) 50 MG tablet Take 50 mg by mouth every 2 (two) hours as needed for migraine. May repeat in 2 hours if headache persists or recurs.   Yes [provider]  temozolomide (TEMODAR) 250 MG capsule Take 1 capsule (250 mg total) by mouth daily. Take for 5 days on, 23 days off. Repeat every 28 days. May take on an empty stomach to decrease nausea & vomiting. 03/31/23  Yes Vaslow, Georgeanna Lea, MD  topiramate (TOPAMAX) 50 MG tablet TAKE 1 TABLET BY MOUTH TWICE A DAY Patient taking differently: Take 50 mg by mouth 2 (two) times daily. 12/26/19  Yes Shade Flood, MD  traZODone (DESYREL) 50 MG tablet Take 1 tablet (50 mg total) by mouth at bedtime as needed for sleep. 05/05/22  Yes Vaslow, Georgeanna Lea, MD  fluticasone (FLONASE) 50 MCG/ACT nasal spray SPRAY 2 SPRAYS INTO EACH NOSTRIL EVERY DAY Patient not taking: Reported on  04/23/2023 09/21/19   Shade Flood, MD  traMADol (ULTRAM) 50 MG tablet Take 50 mg by mouth every 8 (eight) hours as needed (for pain). 10/13/13   [provider]      VITAL SIGNS:  Blood pressure 126/80, pulse 84, temperature 98.4 F (36.9 C), temperature source Oral, resp. rate 18, SpO2 95%.  PHYSICAL EXAMINATION:  Physical Exam  GENERAL:  67 y.o.-year-old Caucasian female patient lying in the bed with no acute distress.  EYES: Pupils equal, round, reactive to light and accommodation. No scleral icterus. Extraocular muscles intact.  HEENT: Head atraumatic, normocephalic. Oropharynx and nasopharynx clear.  NECK:  Supple, no jugular venous distention. No thyroid enlargement, no tenderness.  LUNGS: Normal breath sounds bilaterally, no wheezing, rales,rhonchi or crepitation. No use of accessory muscles of respiration.  CARDIOVASCULAR: Regular rate and rhythm, S1, S2 normal. No murmurs, rubs, or gallops.  ABDOMEN: Soft, nondistended, nontender. Bowel sounds present. No organomegaly or  mass.  EXTREMITIES: No pedal edema, cyanosis, or clubbing.  NEUROLOGIC: Cranial nerves II through XII are intact except for mild dysarthria and expressive dysphasia. Muscle strength 4/5 on the left upper extremity and 2/5 in both lower extremities and 0-1/5 in the right upper extremity.  Sensation intact. Gait not checked.  PSYCHIATRIC: The patient is alert and oriented x 3.  Normal affect and good eye contact. SKIN: No obvious rash, lesion, or ulcer.   LABORATORY PANEL:   CBC Recent Labs  Lab 04/23/23 1323  WBC 9.0  HGB 13.5  HCT 39.0  PLT 195   ------------------------------------------------------------------------------------------------------------------  Chemistries  Recent Labs  Lab 04/23/23 1323  NA 140  K 4.0  CL 112*  CO2 20*  GLUCOSE 127*  BUN 27*  CREATININE 1.01*  CALCIUM 9.0  AST 19  ALT 21  ALKPHOS 62  BILITOT 0.5   ------------------------------------------------------------------------------------------------------------------  Cardiac Enzymes No results for input(s): "TROPONINI" in the last 168 hours. ------------------------------------------------------------------------------------------------------------------  RADIOLOGY:  MR BRAIN W WO CONTRAST  Result Date: 04/23/2023 CLINICAL DATA:  Brain/CNS neoplasm, monitor worsening weakness EXAM: MRI HEAD WITHOUT AND WITH CONTRAST TECHNIQUE: Multiplanar, multiecho pulse sequences of the brain and surrounding structures were obtained without and with intravenous contrast. CONTRAST:  7mL GADAVIST GADOBUTROL 1 MMOL/ML IV SOLN COMPARISON:  Brain MR 03/26/23 FINDINGS: Brain: Negative for an acute infarct. No hemorrhage. No hydrocephalus. No extra-axial fluid collection. Compared to prior exam there is new 3 mm rightward midline shift. Postsurgical changes from a left frontal craniotomy with a subjacent resection cavity redemonstrated. Compared to prior exam there is marked interval increase in the degree of T2/FLAIR  hyperintense signal abnormality surrounding the contrast-enhanced tumor in the left frontal lobe. T2/FLAIR hyperintense signal abnormality now extends posteriorly to the level of the postcentral gyrus. T2/FLAIR hyperintense signal abnormality also extends to involve a large portion of the opercular region. The contrast-enhancing portion of the tumor has also increased in size, best seen on the sagittal post contrast-enhanced T1 weighted see series, now measuring up to 4.8 x 2.8 cm, previously 4.1 x 2.4 cm (series 20, image 15). On the axial post contrast-enhanced sequence the increased in size is best seen along the posterior most aspect, where the contrast-enhancing tumor now measures 2.0 x 1.8 cm, previously 1.3 x 0.8 cm. Vascular: Normal flow voids. Skull and upper cervical spine: Normal marrow signal. Sinuses/Orbits: No middle ear or mastoid effusion. Paranasal sinuses are clear. Orbits are unremarkable. Other: None. IMPRESSION: Interval increase in size of contrast-enhancing tumor centered in the left frontal lobe, as well  as the surrounding T2/FLAIR hyperintense signal abnormality which now extends posteriorly to the level of the postcentral gyrus. There is also new 3 mm rightward midline shift. Electronically Signed   By: Lorenza Cambridge M.D.   On: 04/23/2023 19:03   CT Head Wo Contrast  Result Date: 04/23/2023 CLINICAL DATA:  Left-sided weakness, history of glioblastoma EXAM: CT HEAD WITHOUT CONTRAST TECHNIQUE: Contiguous axial images were obtained from the base of the skull through the vertex without intravenous contrast. RADIATION DOSE REDUCTION: This exam was performed according to the departmental dose-optimization program which includes automated exposure control, adjustment of the mA and/or kV according to patient size and/or use of iterative reconstruction technique. COMPARISON:  04/12/2023 FINDINGS: Brain: Vasogenic edema of the bilateral frontal lobes is again identified, left greater than right,  slightly more pronounced than prior study. The tumor margins within the left frontal lobe seen on prior MRI are difficult to delineate on this unenhanced CT. No evidence of acute infarct or hemorrhage. Lateral ventricles and remaining midline structures are unremarkable. No acute extra-axial fluid collections. No mass effect. Vascular: No hyperdense vessel or unexpected calcification. Skull: Prior left frontal craniotomy.  No acute bony abnormality. Sinuses/Orbits: No acute finding. Other: None. IMPRESSION: 1. Continued vasogenic edema within the bilateral frontal lobes, left greater than right, slightly more pronounced than prior study. Tumor margins within the left frontal lobe are poorly delineated on unenhanced CT. 2. No acute infarct or hemorrhage. Electronically Signed   By: Sharlet Salina M.D.   On: 04/23/2023 15:32      IMPRESSION AND PLAN:  Assessment and Plan: * Left-sided weakness - The patient also has associated right-sided hemiplegia.  This is like secondary to enlarging glioblastoma multiforme size, pressure effect and cerebral edema. - She will be admitted to a progressive unit bed at [facility]. - We will follow neurochecks every 4 hours for 24 hours. - Will obtain PT/OT and ST consults. - Neurology consult will be obtained. - I notified Dr. Amada Jupiter about the patient.  The patient also follows with her neuro oncologist Dr. Barbaraann Cao.  Will defer neurosurgery evaluation to the neurologist at [facility]. - We will continue IV Decadron. - We will continue her Temodar.  Seizure disorder [facility]) - This is likely secondary to her GBM. - We will continue Keppra.  Migraine - We will continue Imitrex and Topamax.  Essential hypertension - We will continue her antihypertensive therapy.       DVT prophylaxis: Lovenox. Advanced Care Planning:  Code Status: The patient is DNR only. Family Communication:  The plan of care was discussed in details with the patient (and family). I  answered all questions. The patient agreed to proceed with the above mentioned plan. Further management will depend upon hospital course. Disposition Plan: Back to previous home environment Consults called: Neurology All the records are reviewed and case discussed with ED provider.  Status is: Inpatient    At the time of the admission, it appears that the appropriate admission status for this patient is inpatient.  This is judged to be reasonable and necessary in order to provide the required intensity of service to ensure the patient's safety given the presenting symptoms, physical exam findings and initial radiographic and laboratory data in the context of comorbid conditions.  The patient requires inpatient status due to high intensity of service, high risk of further deterioration and high frequency of surveillance required.  I certify that at the time of admission, it is my clinical judgment that the patient  will require inpatient hospital care extending more than 2 midnights.                            Dispo: The patient is from: Home              Anticipated d/c is to: Home              Patient currently is not medically stable to d/c.              Difficult to place patient: No  Hannah Beat M.D on 04/23/2023 at 8:57 PM  [facility]   From 7 PM-7 AM, contact night-coverage [email or web address]  CC: Primary care physician; Judd Lien, PA-C

## 2023-04-23 NOTE — Assessment & Plan Note (Signed)
-  We will continue her antihypertensive therapy 

## 2023-04-23 NOTE — ED Triage Notes (Signed)
Pt BIBA from home for increasing left sided weakness. Pt has full R side deficits from brain cx. Supposed to have MRI here today at 4p. Family also concerned they cannot provide care at home any more. A&Ox4, mentation at baseline. No complaints, denies pain.  128/80 HR 90 94% RA CBG 126

## 2023-04-23 NOTE — ED Provider Notes (Signed)
  Physical Exam  BP 126/80 (BP Location: Left Arm)   Pulse 84   Temp 98.4 F (36.9 C) (Oral)   Resp 18   SpO2 95%   Physical Exam  Procedures  Procedures  ED Course / MDM    Medical Decision Making Care assumed at 3 PM.  Patient is here with right-sided paralysis.  CT showed worsening vasogenic edema.  MRI is pending at signout.  Dr. Selina Cooley from neurology was consulted.  7:35 PM MRI resulted and showed increasing size of the tumor.  I discussed case with Dr. Selina Cooley from neurology again.  She recommend Decadron and transferred to [facility] for evaluation by neurology.  She states that neurology may get neurosurgery involved tomorrow. Hospitalist to admit   CRITICAL CARE Performed by: Richardean Canal   Total critical care time: 30 minutes  Critical care time was exclusive of separately billable procedures and treating other patients.  Critical care was necessary to treat or prevent imminent or life-threatening deterioration.  Critical care was time spent personally by me on the following activities: development of treatment plan with patient and/or surrogate as well as nursing, discussions with consultants, evaluation of patient's response to treatment, examination of patient, obtaining history from patient or surrogate, ordering and performing treatments and interventions, ordering and review of laboratory studies, ordering and review of radiographic studies, pulse oximetry and re-evaluation of patient's condition.   Problems Addressed: Glioblastoma multiforme (HCC): acute illness or injury Weakness of right lower extremity: acute illness or injury Weakness of right upper extremity: acute illness or injury  Amount and/or Complexity of Data Reviewed Labs: ordered. Decision-making details documented in ED Course. Radiology: ordered and independent interpretation performed. Decision-making details documented in ED Course.  Risk Prescription drug management. Decision regarding  hospitalization.          Charlynne Pander, MD 04/23/23 [id number]

## 2023-04-23 NOTE — Assessment & Plan Note (Signed)
-   We will continue Imitrex and Topamax.

## 2023-04-23 NOTE — Assessment & Plan Note (Signed)
-   This is likely secondary to her GBM. - We will continue Keppra.

## 2023-04-23 NOTE — ED Provider Notes (Signed)
[place] EMERGENCY DEPARTMENT AT [facility] Provider Note   CSN: [id number] Arrival date & time: 04/23/23  1256     History  Chief Complaint  Patient presents with   Weakness    Jennifer [name] is a 67 y.o. female.  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  Patient was seen on 04/12/2023 for new seizure disorder.  Patient was sent home with Keppra and follow-up recommendations with oncology.  Oncology ordered an outpatient MRI of brain with and without contrast has not been completed at this time.  The family states on 04/12/23 the patient not have focalized shaking but did have right upper extremity weakness that completely resolved prior to discharge.  Patient was able to walk out of the department.  They state over the last 2 weeks patient has severe notes including inability to lift the right arm off the table.  Patient also presenting today with left upper extremity weakness.  They states the symptoms started most severely on Monday approx 2 days ago.  Denies any falls or head trauma..  The history is provided by the patient. No language interpreter was used.  Weakness Associated symptoms: no abdominal pain, no arthralgias, no chest pain, no cough, no dysuria, no fever, no seizures, no shortness of breath and no vomiting        Home Medications Prior to Admission medications   Medication Sig Start Date End Date Taking? Authorizing Provider  amLODipine (NORVASC) 5 MG tablet TAKE 1 TABLET BY MOUTH EVERY DAY 06/06/19   Shade Flood, MD  atorvastatin (LIPITOR) 10 MG tablet TAKE 1 TABLET BY MOUTH EVERY DAY Patient taking differently: Take 10 mg by mouth daily. 11/10/19   Shade Flood, MD  busPIRone (BUSPAR) 5 MG tablet TAKE 1 TABLET BY MOUTH THREE TIMES A DAY Patient taking differently: Take  5 mg by mouth 3 (three) times daily. 08/16/19   Shade Flood, MD  dexamethasone (DECADRON) 2 MG tablet Take 1 tablet (2 mg total) by mouth daily. 04/20/23   Vaslow, Georgeanna Lea, MD  fluticasone (FLONASE) 50 MCG/ACT nasal spray SPRAY 2 SPRAYS INTO EACH NOSTRIL EVERY DAY Patient taking differently: Place 2 sprays into both nostrils daily. 09/21/19   Shade Flood, MD  levETIRAcetam (KEPPRA) 500 MG tablet Take 1 tablet (500 mg total) by mouth 2 (two) times daily. 04/12/23   Glyn Ade, MD  losartan (COZAAR) 50 MG tablet TAKE 1 TABLET BY MOUTH EVERY DAY 04/28/19   Shade Flood, MD  meloxicam (MOBIC) 7.5 MG tablet Take 1 tablet by mouth 2 (two) times daily. 06/16/22   [provider]  ondansetron (ZOFRAN) 8 MG tablet Take 1 tablet (8 mg total) by mouth every 8 (eight) hours as needed for nausea or vomiting. May take 30-60 minutes prior to Temodar administration if nausea/vomiting occurs as needed. 06/26/22   Henreitta Leber, MD  prochlorperazine (COMPAZINE) 10 MG tablet Take 1 tablet (10 mg total) by mouth every 6 (six) hours as needed for nausea or vomiting. 03/31/23   Henreitta Leber, MD  SUMAtriptan (IMITREX) 50 MG tablet Take 50 mg by mouth every 2 (two) hours as needed for migraine. May repeat in 2 hours if headache persists or recurs.    [provider]  temozolomide (TEMODAR) 250 MG capsule Take 1 capsule (250 mg total) by mouth  daily. Take for 5 days on, 23 days off. Repeat every 28 days. May take on an empty stomach to decrease nausea & vomiting. 03/31/23   Henreitta Leber, MD  topiramate (TOPAMAX) 50 MG tablet TAKE 1 TABLET BY MOUTH TWICE A DAY Patient taking differently: Take 50 mg by mouth 2 (two) times daily. 12/26/19   Shade Flood, MD  traMADol (ULTRAM) 50 MG tablet Take 50 mg by mouth every 8 (eight) hours as needed. 10/13/13   [provider]  traZODone (DESYREL) 50 MG tablet Take 1 tablet (50 mg total) by mouth at bedtime as needed for sleep.  05/05/22   Henreitta Leber, MD      Allergies    Patient has no known allergies.    Review of Systems   Review of Systems  Constitutional:  Negative for chills and fever.  HENT:  Negative for ear pain and sore throat.   Eyes:  Negative for pain and visual disturbance.  Respiratory:  Negative for cough and shortness of breath.   Cardiovascular:  Negative for chest pain and palpitations.  Gastrointestinal:  Negative for abdominal pain and vomiting.  Genitourinary:  Negative for dysuria and hematuria.  Musculoskeletal:  Negative for arthralgias and back pain.  Skin:  Negative for color change and rash.  Neurological:  Positive for weakness. Negative for seizures and syncope.  All other systems reviewed and are negative.   Physical Exam Updated Vital Signs BP (!) 149/79   Pulse 90   Temp 98.9 F (37.2 C) (Oral)   Resp 16   SpO2 96%  Physical Exam Vitals and nursing note reviewed.  Constitutional:      General: She is not in acute distress.    Appearance: She is well-developed.  HENT:     Head: Normocephalic and atraumatic.  Eyes:     Conjunctiva/sclera: Conjunctivae normal.  Cardiovascular:     Rate and Rhythm: Normal rate and regular rhythm.     Heart sounds: No murmur heard. Pulmonary:     Effort: Pulmonary effort is normal. No respiratory distress.     Breath sounds: Normal breath sounds.  Abdominal:     Palpations: Abdomen is soft.     Tenderness: There is no abdominal tenderness.  Musculoskeletal:        General: No swelling.     Cervical back: Neck supple.  Skin:    General: Skin is warm and dry.     Capillary Refill: Capillary refill takes less than 2 seconds.  Neurological:     Mental Status: She is alert and oriented to person, place, and time.     GCS: GCS eye subscore is 4. GCS verbal subscore is 5. GCS motor subscore is 6.     Cranial Nerves: Cranial nerves 2-12 are intact.     Sensory: Sensation is intact.     Motor: Weakness present.      Coordination: Coordination is intact.     Comments: Right upper extremity and right lower extremity weakness.  Patient unable to lift the arm or leg off the bed.  Sensation intact.  Psychiatric:        Mood and Affect: Mood normal.     ED Results / Procedures / Treatments   Labs (all labs ordered are listed, but only abnormal results are displayed) Labs Reviewed  COMPREHENSIVE METABOLIC PANEL - Abnormal; Notable for the following components:      Result Value   Chloride 112 (*)    CO2 20 (*)  Glucose, Bld 127 (*)    BUN 27 (*)    Creatinine, Ser 1.01 (*)    All other components within normal limits  CBC WITH DIFFERENTIAL/PLATELET - Abnormal; Notable for the following components:   MCH 34.1 (*)    Neutro Abs 8.1 (*)    Lymphs Abs 0.5 (*)    All other components within normal limits  PROTIME-INR  APTT  ETHANOL  RAPID URINE DRUG SCREEN, HOSP PERFORMED  I-STAT CG4 LACTIC ACID, ED  I-STAT CG4 LACTIC ACID, ED    EKG None  Radiology CT Head Wo Contrast  Result Date: 04/23/2023 CLINICAL DATA:  Left-sided weakness, history of glioblastoma EXAM: CT HEAD WITHOUT CONTRAST TECHNIQUE: Contiguous axial images were obtained from the base of the skull through the vertex without intravenous contrast. RADIATION DOSE REDUCTION: This exam was performed according to the departmental dose-optimization program which includes automated exposure control, adjustment of the mA and/or kV according to patient size and/or use of iterative reconstruction technique. COMPARISON:  04/12/2023 FINDINGS: Brain: Vasogenic edema of the bilateral frontal lobes is again identified, left greater than right, slightly more pronounced than prior study. The tumor margins within the left frontal lobe seen on prior MRI are difficult to delineate on this unenhanced CT. No evidence of acute infarct or hemorrhage. Lateral ventricles and remaining midline structures are unremarkable. No acute extra-axial fluid collections. No  mass effect. Vascular: No hyperdense vessel or unexpected calcification. Skull: Prior left frontal craniotomy.  No acute bony abnormality. Sinuses/Orbits: No acute finding. Other: None. IMPRESSION: 1. Continued vasogenic edema within the bilateral frontal lobes, left greater than right, slightly more pronounced than prior study. Tumor margins within the left frontal lobe are poorly delineated on unenhanced CT. 2. No acute infarct or hemorrhage. Electronically Signed   By: Sharlet Salina M.D.   On: 04/23/2023 15:32    Procedures Procedures    Medications Ordered in ED Medications  dexamethasone (DECADRON) injection 10 mg (has no administration in time range)    ED Course/ Medical Decision Making/ A&P                                 Medical Decision Making Amount and/or Complexity of Data Reviewed Labs: ordered. Radiology: ordered.   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  Patient unable to lift right arm or leg off the bed.  Last known normal 2 days ago.  Patient outside of the stroke activation but will still obtain imaging.  CT head pending.  MRI pending.    Laboratory studies are stable.  Neurology on call consulted and will be following patient.  Request call after MRI complete.  Recommending admission to [facility] if patient positive for CVA.  Recommending admission to [facility] long if no CVA and symptoms more related to glioblastoma.  CT head stable at this time.  MRI still pending at shift change.  Patient signed out to oncoming provider Dr. Silverio Lay.           Final Clinical Impression(s) / ED Diagnoses Final diagnoses:  Glioblastoma multiforme (HCC)  Weakness of right upper extremity  Weakness of right lower extremity    Rx / DC Orders ED Discharge Orders     None  Edwin Dada P, DO 04/23/23 [phone]

## 2023-04-23 NOTE — Assessment & Plan Note (Signed)
-   The patient also has associated right-sided hemiplegia.  This is like secondary to enlarging glioblastoma multiforme size, pressure effect and cerebral edema. - She will be admitted to a progressive unit bed at [facility]. - We will follow neurochecks every 4 hours for 24 hours. - Will obtain PT/OT and ST consults. - Neurology consult will be obtained. - I notified Dr. Amada Jupiter about the patient.  The patient also follows with her neuro oncologist Dr. Barbaraann Cao.  Will defer neurosurgery evaluation to the neurologist at [facility]. - We will continue IV Decadron. - We will continue her Temodar.

## 2023-04-23 NOTE — Telephone Encounter (Signed)
Family states Jennifer [name] is due for a scan today, but she has no feeling on her right side. They are having trouble getting her around and afraid that she is going to fall/ get hurt. They are going to call 911 to take her to the ED. Dr Barbaraann Cao notified

## 2023-04-24 ENCOUNTER — Inpatient Hospital Stay (HOSPITAL_COMMUNITY): Payer: Medicare Other

## 2023-04-24 ENCOUNTER — Other Ambulatory Visit (HOSPITAL_COMMUNITY): Payer: Self-pay

## 2023-04-24 DIAGNOSIS — C711 Malignant neoplasm of frontal lobe: Secondary | ICD-10-CM | POA: Diagnosis not present

## 2023-04-24 DIAGNOSIS — R531 Weakness: Secondary | ICD-10-CM | POA: Diagnosis not present

## 2023-04-24 DIAGNOSIS — G40909 Epilepsy, unspecified, not intractable, without status epilepticus: Secondary | ICD-10-CM | POA: Diagnosis not present

## 2023-04-24 LAB — HEMOGLOBIN A1C
Hgb A1c MFr Bld: 5.3 % (ref 4.8–5.6)
Mean Plasma Glucose: 105.41 mg/dL

## 2023-04-24 LAB — LIPID PANEL
Cholesterol: 167 mg/dL (ref 0–200)
Cholesterol: 183 mg/dL (ref 0–200)
HDL: 46 mg/dL (ref >40)
HDL: 45 mg/dL (ref >40)
LDL Cholesterol: 95 mg/dL (ref 0–99)
LDL Cholesterol: 107 mg/dL — ABNORMAL HIGH (ref 0–99)
Total CHOL/HDL Ratio: 3.6 ratio
Total CHOL/HDL Ratio: 4.1 RATIO
Triglycerides: 130 mg/dL (ref <150)
Triglycerides: 154 mg/dL — ABNORMAL HIGH (ref <150)
VLDL: 26 mg/dL (ref 0–40)
VLDL: 31 mg/dL (ref 0–40)

## 2023-04-24 LAB — MRSA NEXT GEN BY PCR, NASAL: MRSA by PCR Next Gen: NOT DETECTED

## 2023-04-24 LAB — HIV ANTIBODY (ROUTINE TESTING W REFLEX): HIV Screen 4th Generation wRfx: NONREACTIVE

## 2023-04-24 MED ORDER — LACOSAMIDE 50 MG PO TABS
50.0000 mg | ORAL_TABLET | Freq: Two times a day (BID) | ORAL | Status: DC
  Administered 2023-04-24 – 2023-04-29 (×10): 50 mg via ORAL
  Filled 2023-04-24 (×10): qty 1

## 2023-04-24 MED ORDER — SODIUM CHLORIDE 0.9 % IV SOLN
200.0000 mg | Freq: Once | INTRAVENOUS | Status: AC
  Administered 2023-04-24: 200 mg via INTRAVENOUS
  Filled 2023-04-24: qty 20

## 2023-04-24 NOTE — Evaluation (Signed)
Physical Therapy Evaluation Patient Details Name: Jennifer [name] MRN: [id number] DOB: 04-15-1956 Today's Date: 04/24/2023  History of Present Illness  Patient is a 67 y.o. female with medical history significant for essential hypertension, dyslipidemia, migraine and glioblastoma multiforme status post frontal lobe craniotomy and resection, presenting to the emergency room with acute onset of right-sided weakness. MRI of brain  increase in size of contrast-enhancing tumor centered in the left frontal lobe, as well as the surrounding T2/FLAIR hyperintense signal abnormality which now extends posteriorly to the level of the postcentral gyrus  Clinical Impression  Patient is agreeable to PT evaluation. She has difficulty with word finding with inappropriate laughing throughout session. She is able to follow single step commands consistently with occasional verbal cues. She report she is independent with mobility at baseline without assistive device and lives with other family members.  The patient had no active movement in RUE. Significant weakness noted in RLE. The patient required physical assistance for bed mobility. Poor sitting balance initially progressing to fair with increased sitting time. Assistance required for standing, performed x 5 bouts with poor standing tolerance and decreased acceptance on RLE while standing. Unable to walk at this time. Recommend to continue PT to maximize independence and decrease caregiver burden. Anticipate the need for intensive therapy based on current functional status, pending patient and family goals.     If plan is discharge home, recommend the following: Two people to help with walking and/or transfers;A lot of help with bathing/dressing/bathroom;Assist for transportation;Help with stairs or ramp for entrance;Supervision due to cognitive status;Assistance with cooking/housework   Can travel by private vehicle        Equipment Recommendations  (to be  determined at next level of care)  Recommendations for Other Services  Rehab consult    Functional Status Assessment Patient has had a recent decline in their functional status and demonstrates the ability to make significant improvements in function in a reasonable and predictable amount of time.     Precautions / Restrictions Precautions Precautions: Fall Restrictions Weight Bearing Restrictions: No      Mobility  Bed Mobility Overal bed mobility: Needs Assistance Bed Mobility: Supine to Sit, Sit to Supine     Supine to sit: Mod assist Sit to supine: Mod assist   General bed mobility comments: assistance for right hemibody. cues for technique    Transfers Overall transfer level: Needs assistance Equipment used: 1 person hand held assist Transfers: Sit to/from Stand Sit to Stand: Mod assist, Max assist           General transfer comment: verbal cues for technique. decreased weight acceptance on RLE with left lateral lean with standing attempts. sit to stand performed x 5 bouts    Ambulation/Gait             Pre-gait activities: faciliation for R side weight shifting, cues for posture. standing tolerance less than 20 seconds General Gait Details: difficulty with weight shifting to right with limited weight acceptance on RLE despite faciliation. unable to advance walking at this time  Careers information officer     Tilt Bed    Modified Rankin (Stroke Patients Only)       Balance Overall balance assessment: Needs assistance Sitting-balance support: Feet supported Sitting balance-Leahy Scale:  (poor initially progressing to fair) Sitting balance - Comments: initial left side lean as patient attempting to reach the bed rail for support. she is able to eventually maintain  midline sitting balance without external support and no UE support   Standing balance support: Single extremity supported Standing balance-Leahy Scale: Poor Standing  balance comment: external support required.                             Pertinent Vitals/Pain Pain Assessment Pain Assessment: No/denies pain    Home Living Family/patient expects to be discharged to:: Private residence Living Arrangements: Children Available Help at Discharge: Family Type of Home: House Home Access: Stairs to enter Entrance Stairs-Rails: Doctor, general practice of Steps: 3   Home Layout: One level Home Equipment: Agricultural consultant (2 wheels);Cane - single point;Wheelchair - manual (per patient report (no family in room to confirm))      Prior Function Prior Level of Function : Independent/Modified Independent             Mobility Comments: patient reports she is independent at baseline without assistive device, does not drive ADLs Comments: patient reports independent at baseline, does not drive     Extremity/Trunk Assessment   Upper Extremity Assessment Upper Extremity Assessment: Right hand dominant;RUE deficits/detail (LUE WFL for tasks assessed) RUE Deficits / Details: no active movement noted RUE Sensation:  (difficult to assess with aphasia. she reports sensation is intact) RUE Coordination: decreased fine motor;decreased gross motor    Lower Extremity Assessment Lower Extremity Assessment: RLE deficits/detail;LLE deficits/detail RLE Deficits / Details: knee extension 2/5, dorsiflexion 1/5, plantarflexion 0/5, hip flexion 1/5 RLE Sensation:  (difficut to assess with aphasia, she reports sensation is intact) RLE Coordination: decreased fine motor;decreased gross motor LLE Deficits / Details: WFL for tasks assessed       Communication   Communication Communication: Difficulty communicating thoughts/reduced clarity of speech Cueing Techniques: Verbal cues  Cognition Arousal: Alert Behavior During Therapy:  (intermittent unappropriate laughing) Overall Cognitive Status: Impaired/Different from baseline Area of Impairment:  Following commands, Safety/judgement, Problem solving                       Following Commands: Follows one step commands consistently, Follows multi-step commands inconsistently Safety/Judgement: Decreased awareness of safety, Decreased awareness of deficits   Problem Solving: Slow processing, Requires verbal cues General Comments: patient is able to follow single step commands consistently with occasional verbal cues. she has ddifficulty with word finding and often laughs when she is unable to find the word        General Comments General comments (skin integrity, edema, etc.): heart rate 100's-110's with sitting activity    Exercises     Assessment/Plan    PT Assessment Patient needs continued PT services  PT Problem List Decreased range of motion;Decreased strength;Decreased activity tolerance;Decreased mobility;Decreased balance;Decreased safety awareness;Decreased coordination;Impaired tone;Impaired sensation       PT Treatment Interventions DME instruction;Gait training;Stair training;Functional mobility training;Therapeutic activities;Therapeutic exercise;Neuromuscular re-education;Balance training;Cognitive remediation;Patient/family education;Wheelchair mobility training    PT Goals (Current goals can be found in the Care Plan section)  Acute Rehab PT Goals Patient Stated Goal: to feel better PT Goal Formulation: With patient Time For Goal Achievement: 05/08/23 Potential to Achieve Goals: Good    Frequency Min 1X/week     Co-evaluation               AM-PAC PT "6 Clicks" Mobility  Outcome Measure Help needed turning from your back to your side while in a flat bed without using bedrails?: A Lot Help needed moving from lying on your back to sitting on  the side of a flat bed without using bedrails?: A Lot Help needed moving to and from a bed to a chair (including a wheelchair)?: A Lot Help needed standing up from a chair using your arms (e.g.,  wheelchair or bedside chair)?: A Lot Help needed to walk in hospital room?: Total Help needed climbing 3-5 steps with a railing? : Total 6 Click Score: 10    End of Session Equipment Utilized During Treatment: Gait belt Activity Tolerance: Patient tolerated treatment well;Patient limited by fatigue Patient left: in bed;with call bell/phone within reach;with bed alarm set Nurse Communication: Mobility status PT Visit Diagnosis: Difficulty in walking, not elsewhere classified (R26.2);Other abnormalities of gait and mobility (R26.89)    Time: 6045-4098 PT Time Calculation (min) (ACUTE ONLY): 29 min   Charges:   PT Evaluation $PT Eval Moderate Complexity: 1 Mod PT Treatments $Therapeutic Activity: 8-22 mins PT General Charges $$ ACUTE PT VISIT: 1 Visit         Donna Bernard, PT, MPT   Ina Homes 04/24/2023, 10:48 AM

## 2023-04-24 NOTE — Consult Note (Signed)
[facility] Neuro-Oncology Consult Note  Patient Care Team: Barrington Ellison as PCP - General (Physician Assistant)  CHIEF COMPLAINTS/PURPOSE OF CONSULTATION:  Glioblastoma Seizures  HISTORY OF PRESENTING ILLNESS:  Jennifer [name] 67 y.o. female presented with several days of severe right sided weakness, difficulty speaking.  She had experienced a seizure the week prior, which had resulted in same symptoms post-ictally, but transiently.  At present she has little use of the right arm, some elements of history are provided by family.  Recently completed MRI study for review today.  MEDICAL HISTORY:  Past Medical History:  Diagnosis Date   Brain cancer (HCC)    Hyperlipidemia    Hypertension    Migraine     SURGICAL HISTORY: Past Surgical History:  Procedure Laterality Date   APPENDECTOMY     APPLICATION OF CRANIAL NAVIGATION N/A 04/11/2022   Procedure: APPLICATION OF CRANIAL NAVIGATION;  Surgeon: Coletta Memos, MD;  Location: [facility] OR;  Service: Neurosurgery;  Laterality: N/A;   BRAIN SURGERY     CRANIOTOMY Left 04/11/2022   Procedure: LEFT FRONTAL CRANIOTOMY FOR RESECTION OF TUMOR WITH [name];  Surgeon: Coletta Memos, MD;  Location: [facility] OR;  Service: Neurosurgery;  Laterality: Left;  RM 21   LAPAROSCOPIC APPENDECTOMY  07/15/2012   Procedure: APPENDECTOMY LAPAROSCOPIC;  Surgeon: Liz Malady, MD;  Location: [facility] OR;  Service: General;  Laterality: N/A;  Laparoscopic Appendectomy    SOCIAL HISTORY: Social History   Socioeconomic History   Marital status: Widowed    Spouse name: Not on file   Number of children: 2   Years of education: Not on file   Highest education level: Not on file  Occupational History   Occupation: retail    Comment: [employer] in [place], [place]  Tobacco Use   Smoking status: Never   Smokeless tobacco: Never  Substance and Sexual Activity   Alcohol use: Yes    Comment: 2 drinks once every 2-3 months   Drug use: No   Sexual  activity: Not on file  Other Topics Concern   Not on file  Social History Narrative   Marital status: widowed since 2008; husband was bipolar and alcoholic      Children: 2 children (28, 72); son lives with patient.Daughter lives in [place]/autism/does not drive.  Daughter lives with mother-in-law.  One grandpuppy      Lives: with son      Employment: works at [employer]; also works in Corporate investment banker business.      Tobacco: none      Alcohol: none; husband was an alcoholic.      Drugs:   none      Exercise:   Social Determinants of Health   Financial Resource Strain: Low Risk  (02/26/2022)   Received from [facility]   Overall Financial Resource Strain (CARDIA)  Food Insecurity: No Food Insecurity (02/26/2022)   Received from [facility]   Hunger Vital Sign  Transportation Needs: No Transportation Needs (02/26/2022)   Received from [facility] - Transportation  Physical Activity: Insufficiently Active (02/26/2022)   Received from [facility]   Exercise Vital Sign  Stress: Stress Concern Present (02/26/2022)   Received from [facility]   [employer] [facility] - Occupational Stress Questionnaire  Social Connections: Socially Isolated (02/26/2022)   Received from [facility]   Social Connection and Isolation Panel [NHANES]  Intimate Partner Violence: Not At Risk (02/26/2022)   Received from [facility] visits  prior to 10/25/2022., [facility] visits prior to 10/25/2022.   Humiliation, Afraid, Rape, and Kick questionnaire    Fear of Current or Ex-Partner: No    Emotionally Abused: No    Physically Abused: No    Sexually Abused: No    FAMILY HISTORY: Family History  Adopted: Yes    ALLERGIES:  has No Known Allergies.  MEDICATIONS:  Current Facility-Administered Medications  Medication Dose Route Frequency Provider Last Rate Last Admin   0.9 %  sodium chloride infusion   Intravenous Continuous  Mansy, Jan A, MD 100 mL/hr at 04/24/23 1012 New Bag at 04/24/23 1012   acetaminophen (TYLENOL) tablet 650 mg  650 mg Oral Q4H PRN Mansy, Jan A, MD       Or   acetaminophen (TYLENOL) 160 MG/5ML solution 650 mg  650 mg Per Tube Q4H PRN Mansy, Vernetta Honey, MD       Or   acetaminophen (TYLENOL) suppository 650 mg  650 mg Rectal Q4H PRN Mansy, Jan A, MD       amLODipine (NORVASC) tablet 5 mg  5 mg Oral Daily Mansy, Jan A, MD   5 mg at 04/24/23 1013   atorvastatin (LIPITOR) tablet 10 mg  10 mg Oral Daily Mansy, Jan A, MD   10 mg at 04/24/23 1012   busPIRone (BUSPAR) tablet 5 mg  5 mg Oral TID Mansy, Jan A, MD   5 mg at 04/24/23 1013   dexamethasone (DECADRON) injection 4 mg  4 mg Intravenous Q12H Mansy, Jan A, MD   4 mg at 04/24/23 0616   enoxaparin (LOVENOX) injection 40 mg  40 mg Subcutaneous Q24H Mansy, Jan A, MD   40 mg at 04/23/23 2120   fluticasone (FLONASE) 50 MCG/ACT nasal spray 2 spray  2 spray Each Nare Daily Mansy, Jan A, MD       lacosamide (VIMPAT) 200 mg in sodium chloride 0.9 % 25 mL IVPB  200 mg Intravenous Once Erick Blinks, MD       lacosamide (VIMPAT) tablet 50 mg  50 mg Oral BID Erick Blinks, MD       losartan (COZAAR) tablet 50 mg  50 mg Oral Daily Mansy, Jan A, MD   50 mg at 04/24/23 1012   ondansetron [facility]) injection 4 mg  4 mg Intravenous Q4H PRN Mansy, Jan A, MD       ondansetron [facility]) tablet 8 mg  8 mg Oral Q8H PRN Mansy, Jan A, MD       prochlorperazine (COMPAZINE) tablet 10 mg  10 mg Oral Q6H PRN Mansy, Jan A, MD       senna-docusate (Senokot-S) tablet 1 tablet  1 tablet Oral QHS PRN Mansy, Vernetta Honey, MD       SUMAtriptan (IMITREX) tablet 50 mg  50 mg Oral Q2H PRN Mansy, Vernetta Honey, MD       [START ON 04/29/2023] temozolomide (TEMODAR) capsule 255 mg  255 mg Oral Daily Mansy, Jan A, MD       topiramate (TOPAMAX) tablet 50 mg  50 mg Oral BID Mansy, Jan A, MD   50 mg at 04/24/23 1012   traZODone (DESYREL) tablet 50 mg  50 mg Oral QHS PRN Mansy, Vernetta Honey, MD        REVIEW OF  SYSTEMS:   Constitutional: Denies fevers, chills or abnormal weight loss Eyes: Denies blurriness of vision Ears, nose, mouth, throat, and face: Denies mucositis or sore throat Respiratory: Denies cough, dyspnea or wheezes Cardiovascular:  Denies palpitation, chest discomfort or lower extremity swelling Gastrointestinal:  Denies nausea, constipation, diarrhea GU: Denies dysuria or incontinence Skin: Denies abnormal skin rashes Neurological: Per HPI Musculoskeletal: Denies joint pain, back or neck discomfort. No decrease in ROM Behavioral/Psych: Denies anxiety, disturbance in thought content, and mood instability   PHYSICAL EXAMINATION: Vitals:   04/24/23 0956 04/24/23 1153  BP: (!) 138/59 129/67  Pulse: 98 76  Resp: 17 16  Temp: 98.1 F (36.7 C) 98.2 F (36.8 C)  SpO2: 98% 98%   KPS: 60. General: Alert, cooperative, pleasant, in no acute distress Head: Normal EENT: No conjunctival injection or scleral icterus. Oral mucosa moist Lungs: Resp effort normal Cardiac: Regular rate and rhythm Abdomen: Soft, non-distended abdomen Skin: No rashes cyanosis or petechiae. Extremities: No clubbing or edema  NEUROLOGIC EXAM: Mental Status: Awake, alert, attentive to examiner. Oriented to self and environment. Language impaired with regards to fluency and comprehension.  Cranial Nerves: Visual acuity is grossly normal. Visual fields are full. Extra-ocular movements intact. No ptosis. Face is symmetric, tongue midline. Motor: Tone and bulk are normal. Power 1/5 in right arm and leg. Reflexes are symmetric, no pathologic reflexes present. Intact finger to nose bilaterally Sensory: Intact to light touch and temperature Gait: Non ambulatory   LABORATORY DATA:  I have reviewed the data as listed Lab Results  Component Value Date   WBC 9.0 04/23/2023   HGB 13.5 04/23/2023   HCT 39.0 04/23/2023   MCV 98.5 04/23/2023   PLT 195 04/23/2023   Recent Labs    01/27/23 1123 02/25/23 1800  03/31/23 1113 04/12/23 1911 04/23/23 1323  NA 143   < > 143 143 140  K 4.1   < > 3.9 3.2* 4.0  CL 112*   < > 113* 117* 112*  CO2 25   < > 25 15* 20*  GLUCOSE 100*   < > 112* 104* 127*  BUN 25*   < > 26* 17 27*  CREATININE 1.33*   < > 1.29* 0.98 1.01*  CALCIUM 9.4   < > 9.4 6.9* 9.0  GFRNONAA 44*   < > 46* >60 >60  PROT 6.7  --  6.6  --  6.6  ALBUMIN 4.2  --  4.2  --  3.8  AST 19  --  20  --  19  ALT 18  --  24  --  21  ALKPHOS 62  --  70  --  62  BILITOT 0.5  --  0.5  --  0.5   < > = values in this interval not displayed.    RADIOGRAPHIC STUDIES: I have personally reviewed the radiological images as listed and agreed with the findings in the report. MR BRAIN W WO CONTRAST  Result Date: 04/23/2023 CLINICAL DATA:  Brain/CNS neoplasm, monitor worsening weakness EXAM: MRI HEAD WITHOUT AND WITH CONTRAST TECHNIQUE: Multiplanar, multiecho pulse sequences of the brain and surrounding structures were obtained without and with intravenous contrast. CONTRAST:  7mL GADAVIST GADOBUTROL 1 MMOL/ML IV SOLN COMPARISON:  Brain MR 03/26/23 FINDINGS: Brain: Negative for an acute infarct. No hemorrhage. No hydrocephalus. No extra-axial fluid collection. Compared to prior exam there is new 3 mm rightward midline shift. Postsurgical changes from a left frontal craniotomy with a subjacent resection cavity redemonstrated. Compared to prior exam there is marked interval increase in the degree of T2/FLAIR hyperintense signal abnormality surrounding the contrast-enhanced tumor in the left frontal lobe. T2/FLAIR hyperintense signal abnormality now extends posteriorly to the level of the postcentral  gyrus. T2/FLAIR hyperintense signal abnormality also extends to involve a large portion of the opercular region. The contrast-enhancing portion of the tumor has also increased in size, best seen on the sagittal post contrast-enhanced T1 weighted see series, now measuring up to 4.8 x 2.8 cm, previously 4.1 x 2.4 cm (series 20,  image 15). On the axial post contrast-enhanced sequence the increased in size is best seen along the posterior most aspect, where the contrast-enhancing tumor now measures 2.0 x 1.8 cm, previously 1.3 x 0.8 cm. Vascular: Normal flow voids. Skull and upper cervical spine: Normal marrow signal. Sinuses/Orbits: No middle ear or mastoid effusion. Paranasal sinuses are clear. Orbits are unremarkable. Other: None. IMPRESSION: Interval increase in size of contrast-enhancing tumor centered in the left frontal lobe, as well as the surrounding T2/FLAIR hyperintense signal abnormality which now extends posteriorly to the level of the postcentral gyrus. There is also new 3 mm rightward midline shift. Electronically Signed   By: Lorenza Cambridge M.D.   On: 04/23/2023 19:03   CT Head Wo Contrast  Result Date: 04/23/2023 CLINICAL DATA:  Left-sided weakness, history of glioblastoma EXAM: CT HEAD WITHOUT CONTRAST TECHNIQUE: Contiguous axial images were obtained from the base of the skull through the vertex without intravenous contrast. RADIATION DOSE REDUCTION: This exam was performed according to the departmental dose-optimization program which includes automated exposure control, adjustment of the mA and/or kV according to patient size and/or use of iterative reconstruction technique. COMPARISON:  04/12/2023 FINDINGS: Brain: Vasogenic edema of the bilateral frontal lobes is again identified, left greater than right, slightly more pronounced than prior study. The tumor margins within the left frontal lobe seen on prior MRI are difficult to delineate on this unenhanced CT. No evidence of acute infarct or hemorrhage. Lateral ventricles and remaining midline structures are unremarkable. No acute extra-axial fluid collections. No mass effect. Vascular: No hyperdense vessel or unexpected calcification. Skull: Prior left frontal craniotomy.  No acute bony abnormality. Sinuses/Orbits: No acute finding. Other: None. IMPRESSION: 1.  Continued vasogenic edema within the bilateral frontal lobes, left greater than right, slightly more pronounced than prior study. Tumor margins within the left frontal lobe are poorly delineated on unenhanced CT. 2. No acute infarct or hemorrhage. Electronically Signed   By: Sharlet Salina M.D.   On: 04/23/2023 15:32   CT HEAD WO CONTRAST ( )  Result Date: 04/12/2023 CLINICAL DATA:  Trauma. History of glioblastoma of the frontal lobe. EXAM: CT HEAD WITHOUT CONTRAST TECHNIQUE: Contiguous axial images were obtained from the base of the skull through the vertex without intravenous contrast. RADIATION DOSE REDUCTION: This exam was performed according to the departmental dose-optimization program which includes automated exposure control, adjustment of the mA and/or kV according to patient size and/or use of iterative reconstruction technique. COMPARISON:  Head CT 02/25/2023.  MRI head 03/26/2023. FINDINGS: Brain: Vasogenic edema in the bilateral frontal lobes, left greater than right appear similar to MRI given differences in technique. Left frontal lobe tumor margins are not well delineated on this noncontrast study. There is no acute intracranial hemorrhage, mass effect or midline shift. There is no hydrocephalus. Brain volume is age-appropriate. Vascular: No hyperdense vessel or unexpected calcification. Skull: No acute fractures.  Left frontal craniotomy again seen. Sinuses/Orbits: No acute finding. Other: None. IMPRESSION: 1. No acute intracranial hemorrhage or calvarial fracture. 2. Vasogenic edema in the bilateral frontal lobes appears similar to MRI given differences in technique. Left frontal lobe tumor margins are not well delineated on this noncontrast study. Electronically Signed  By: Darliss Cheney M.D.   On: 04/12/2023 20:13   MR BRAIN W WO CONTRAST  Result Date: 04/05/2023 CLINICAL DATA:  Glioblastoma of the frontal lobe. Assess treatment response. EXAM: MRI HEAD WITHOUT AND WITH CONTRAST  TECHNIQUE: Multiplanar, multiecho pulse sequences of the brain and surrounding structures were obtained without and with intravenous contrast. CONTRAST:  7.37mL GADAVIST GADOBUTROL 1 MMOL/ML IV SOLN COMPARISON:  01/22/2023 FINDINGS: Brain: No acute infarct, mass effect or extra-axial collection. Chronic blood products at the left frontal lobe tumor site. Unchanged area of hyperintense T2-weighted signal within the left frontal lobe. Increased size of the area of abnormal contrast enhancement, which now extends further posteriorly. On sagittal images the area measures 4.1 x 2.4 cm. Vascular: Major flow voids are preserved. Skull and upper cervical spine: Remote left anterior craniotomy Sinuses/Orbits:No paranasal sinus fluid levels or advanced mucosal thickening. No mastoid or middle ear effusion. Normal orbits. IMPRESSION: 1. Increased size of the area of enhancing tumor, now extending posteriorly and measuring 4.1 x 2.4 cm. 2. Unchanged area of hyperintense T2-weighted signal within the left frontal lobe. Electronically Signed   By: Deatra Robinson M.D.   On: 04/05/2023 22:28    ASSESSMENT & PLAN:  Left Frontal Glioblastoma Focal Seizures  Jennifer [name] presents with clinical syndrome localizing to the left frontal lobe.  Etiology is suspected post-ictal Todd's plus accompanying cerebral edema.  Strongly suspect she was having subclinical seizures at home given rapid progression of symptoms, and lack of other reason to lead to this degree of edema.  The tumor itself has progressed very mildly over the past 3 months, and would not itself lead to present symptom burden.  She was KPS 80-90 in clinic 1 month ago with almost identical enhancing volume.   Agree with aggressive corticosteroids, can use decadron 4mg  q6 while inpatient.  Agree with Vimpat over Keppra at this time as well, Topamax can be continued as prior.  Vimpat could be increased to 100mg  BID if needed.  For progressive tumor, re-resection  would be preferred pathway forward.  I discussed case briefly with Dr. Franky Macho, he will take a look at the scans in detail and assess her surgical candidacy.  Surgery could also lead to relief of seizures, potentially.    If focal deficits do not improve in 1-2 days, will need long term EEG to assess for subclinical seizures.  Appreciate neurohospitalist team involvement here as well.  All questions were answered. The patient knows to call the clinic with any problems, questions or concerns.  The total time spent in the encounter was 60 minutes and more than 50% was on counseling and review of test results     Henreitta Leber, MD 04/24/2023 12:25 PM

## 2023-04-24 NOTE — TOC Benefit Eligibility Note (Signed)
Patient Product/process development scientist completed.    The patient is insured through [facility]. Patient has Medicare and is not eligible for a copay card, but may be able to apply for patient assistance, if available.    Ran test claim for lacosamide (Vimpat) 50 mg and the current 30 day co-pay is $121.20 due to a deductible.   This test claim was processed through [facility]- copay amounts may vary at other pharmacies due to pharmacy/plan contracts, or as the patient moves through the different stages of their insurance plan.     Roland Earl, CPHT Pharmacy Technician III Certified Patient Advocate [facility] Patient Advocate Team Direct Number: [phone]  Fax: [phone]

## 2023-04-24 NOTE — Progress Notes (Signed)
? ?  Inpatient Rehab Admissions Coordinator : ? ?Per therapy recommendations, patient was screened for CIR candidacy by Barbara Boyette RN MSN.  At this time patient appears to be a potential candidate for CIR. I will place a rehab consult per protocol for full assessment. Please call me with any questions. ? ?Barbara Boyette RN MSN ?Admissions Coordinator ?[phone] ?  ?

## 2023-04-24 NOTE — Progress Notes (Signed)
Inpatient Rehab Admissions Coordinator:    I met with pt. And daughter in law to discuss potential CIR admit. They are interested once medical course is complete. Family can rotate to provide 24/7 support. Pt. May require re-resection of brain tumor, pending NSGY consult, will follow up and pursue admit once surgery is either ruled out or procedure is complete.   Megan Salon, MS, CCC-SLP Rehab Admissions Coordinator  [phone] (celll) [phone] (office)

## 2023-04-24 NOTE — ED Notes (Signed)
Son Ercel Stroh given a call and made aware that mom was transported to [facility]. Pt verified that it was ok to give son an update.

## 2023-04-24 NOTE — Plan of Care (Signed)

## 2023-04-24 NOTE — Progress Notes (Addendum)
HOSPITALIST ROUNDING NOTE Jennifer [name] LKG:[id number]  DOB: 1955-10-30  DOA: 04/23/2023  PCP: Irving Copas H, PA-C  04/24/2023,4:26 PM   LOS: 1 day      Code Status: Full   From: Home  current Dispo: Unclear     67 year old home dwelling female HTN HLD migraines GBM status postresection 04/17/2023 Had new onset seizures 8/18 and was sent home follow-up with oncology Presented with right-sided weakness mental status changes BUN/creatinine 27/1 Patient given Decadron neurology consulted send progressive bed  Plan  R-sided weakness in the setting of cerebral edema from GBM Defer to oncology and neurology and neuro-oncology Keppra changed to Vimpat after loading dosed at 50 twice daily, continuing Topamax 50 twice daily Continue saline 100 cc/H, continues on Decadron 4 IV every 12 Continues on Temodar as per neuro-oncology  AKI on admission Repeat labs in a.m.-follow-up as below  Migraine Continue sumatriptan 50 twice daily as needed migraine as well as Topamax  HTN continue losartan 50 daily, amlodipine 5 daily  DVT prophylaxis: Lovenox at this time  Status is: Inpatient Remains inpatient appropriate because:   Requires further management    Subjective: Inappropriately laughing cannot answer orienting questions I had to rely on daughter-in-law at the bedside  Objective + exam Vitals:   04/24/23 0500 04/24/23 0654 04/24/23 0956 04/24/23 1153  BP: 127/65 138/80 (!) 138/59 129/67  Pulse: 67 78 98 76  Resp:  16 17 16   Temp:  97.6 F (36.4 C) 98.1 F (36.7 C) 98.2 F (36.8 C)  TempSrc:  Oral Oral Oral  SpO2: 95% 100% 98% 98%   There were no vitals filed for this visit.  Examination: Mild deficits on right side with 1-2/5 power right arm and leg reflexes symmetric finger-nose-finger on left intact Chest clear S1-S2 no murmur RRR Abdomen soft obese nontender no rebound Psych labile   Data Reviewed: reviewed   CBC    Component Value Date/Time   WBC 9.0 04/23/2023  1323   RBC 3.96 04/23/2023 1323   HGB 13.5 04/23/2023 1323   HGB 13.7 03/31/2023 1113   HGB 14.2 03/15/2018 1123   HCT 39.0 04/23/2023 1323   HCT 44.0 03/15/2018 1123   PLT 195 04/23/2023 1323   PLT 168 03/31/2023 1113   PLT 346 03/15/2018 1123   MCV 98.5 04/23/2023 1323   MCV 94 03/15/2018 1123   MCH 34.1 (H) 04/23/2023 1323   MCHC 34.6 04/23/2023 1323   RDW 12.1 04/23/2023 1323   RDW 13.5 03/15/2018 1123   LYMPHSABS 0.5 (L) 04/23/2023 1323   LYMPHSABS 1.7 03/15/2018 1123   MONOABS 0.3 04/23/2023 1323   EOSABS 0.0 04/23/2023 1323   EOSABS 0.2 03/15/2018 1123   BASOSABS 0.0 04/23/2023 1323   BASOSABS 0.0 03/15/2018 1123      Latest Ref Rng & Units 04/23/2023    1:23 PM 04/12/2023    7:11 PM 03/31/2023   11:13 AM  CMP  Glucose 70 - 99 mg/dL 664  403  474   BUN 8 - 23 mg/dL 27  17  26    Creatinine 0.44 - 1.00 mg/dL 2.59  5.63  8.75   Sodium 135 - 145 mmol/L 140  143  143   Potassium 3.5 - 5.1 mmol/L 4.0  3.2  3.9   Chloride 98 - 111 mmol/L 112  117  113   CO2 22 - 32 mmol/L 20  15  25    Calcium 8.9 - 10.3 mg/dL 9.0  6.9  9.4   Total Protein  6.5 - 8.1 g/dL 6.6   6.6   Total Bilirubin 0.3 - 1.2 mg/dL 0.5   0.5   Alkaline Phos 38 - 126 U/L 62   70   AST 15 - 41 U/L 19   20   ALT 0 - 44 U/L 21   24      Scheduled Meds:  amLODipine  5 mg Oral Daily   atorvastatin  10 mg Oral Daily   busPIRone  5 mg Oral TID   dexamethasone (DECADRON) injection  4 mg Intravenous Q12H   enoxaparin (LOVENOX) injection  40 mg Subcutaneous Q24H   fluticasone  2 spray Each Nare Daily   lacosamide  50 mg Oral BID   losartan  50 mg Oral Daily   [START ON 04/29/2023] temozolomide  255 mg Oral Daily   topiramate  50 mg Oral BID   Continuous Infusions:  sodium chloride 100 mL/hr at 04/24/23 1012    Time 24  Rhetta Mura, MD  [facility]

## 2023-04-24 NOTE — ED Notes (Signed)
[facility] called for transport. 

## 2023-04-24 NOTE — Consult Note (Signed)
NEUROLOGY CONSULTATION NOTE   Date of service: April 24, 2023 [name] MRN:  [id number] DOB:  Oct 13, 1955 Reason for consult: "acute R sided weakness" Requesting Provider: Rhetta Mura, MD _ _ _   _ __   _ __ _ _  __ __   _ __   __ _  History of Present Illness  Jennifer [name] is a 67 y.o. female with PMH significant for HLD, HTN, migraine, GBM s/p resection who presents with R sided weakness that has progressively worsened over the last couple of weeks.  Recently evaluated in the ED on 8/18 for a first time seizure and discharged on keppra. Walked out of the ED and was able to get in the car herself.  Gradually declined since. Per family, over the last 3 days, in a wheelchair with profound RUE and RLE weakness. Feels LUE is weak too.  She has mild to moderate aphasia and non fluent but able to tell me that "movements" have stopped in her RLE since she was started on Keppra.  She had MRI brain with and without contrast which demonstrated progression of her known left frontal lobe GMB, now extending posteriorly.    ROS   Constitutional Denies weight loss, fever and chills.   HEENT Denies changes in vision and hearing.   Respiratory Denies SOB and cough.   CV Denies palpitations and CP   GI Denies abdominal pain, nausea, vomiting and diarrhea.   GU Denies dysuria and urinary frequency.   MSK Denies myalgia and joint pain.   Skin Denies rash and pruritus.   Neurological Denies headache and syncope.   Psychiatric Denies recent changes in mood. Denies anxiety and depression.    Past History   Past Medical History:  Diagnosis Date   Brain cancer (HCC)    Hyperlipidemia    Hypertension    Migraine    Past Surgical History:  Procedure Laterality Date   APPENDECTOMY     APPLICATION OF CRANIAL NAVIGATION N/A 04/11/2022   Procedure: APPLICATION OF CRANIAL NAVIGATION;  Surgeon: Coletta Memos, MD;  Location: [facility];  Service: Neurosurgery;  Laterality: N/A;    BRAIN SURGERY     CRANIOTOMY Left 04/11/2022   Procedure: LEFT FRONTAL CRANIOTOMY FOR RESECTION OF TUMOR WITH [name];  Surgeon: Coletta Memos, MD;  Location: [facility];  Service: Neurosurgery;  Laterality: Left;  RM 21   LAPAROSCOPIC APPENDECTOMY  07/15/2012   Procedure: APPENDECTOMY LAPAROSCOPIC;  Surgeon: Liz Malady, MD;  Location: [facility];  Service: General;  Laterality: N/A;  Laparoscopic Appendectomy   Family History  Adopted: Yes   Social History   Socioeconomic History   Marital status: Widowed    Spouse name: Not on file   Number of children: 2   Years of education: Not on file   Highest education level: Not on file  Occupational History   Occupation: retail    Comment: [employer] in [place], [place]  Tobacco Use   Smoking status: Never   Smokeless tobacco: Never  Substance and Sexual Activity   Alcohol use: Yes    Comment: 2 drinks once every 2-3 months   Drug use: No   Sexual activity: Not on file  Other Topics Concern   Not on file  Social History Narrative   Marital status: widowed since 2008; husband was bipolar and alcoholic      Children: 2 children (28, 52); son lives with patient.Daughter lives in [place]/autism/does not drive.  Daughter lives with mother-in-law.  One  grandpuppy      Lives: with son      Employment: works at [employer]; also works in Corporate investment banker business.      Tobacco: none      Alcohol: none; husband was an alcoholic.      Drugs:   none      Exercise:   Social Determinants of Health   Financial Resource Strain: Low Risk  (02/26/2022)   Received from [facility]   Overall Financial Resource Strain (CARDIA)  Food Insecurity: No Food Insecurity (02/26/2022)   Received from [facility]   Hunger Vital Sign  Transportation Needs: No Transportation Needs (02/26/2022)   Received from [facility] - Transportation  Physical Activity: Insufficiently Active (02/26/2022)   Received from [facility]   Exercise Vital Sign   Stress: Stress Concern Present (02/26/2022)   Received from [facility]   [employer] of Occupational Health - Occupational Stress Questionnaire  Social Connections: Socially Isolated (02/26/2022)   Received from [facility]   Social Connection and Isolation Panel [NHANES]   No Known Allergies  Medications   Medications Prior to Admission  Medication Sig Dispense Refill Last Dose   amLODipine (NORVASC) 5 MG tablet TAKE 1 TABLET BY MOUTH EVERY DAY (Patient taking differently: Take 5 mg by mouth daily.) 90 tablet 1 04/23/2023   atorvastatin (LIPITOR) 10 MG tablet TAKE 1 TABLET BY MOUTH EVERY DAY (Patient taking differently: Take 10 mg by mouth daily.) 90 tablet 1 04/23/2023   busPIRone (BUSPAR) 5 MG tablet TAKE 1 TABLET BY MOUTH THREE TIMES A DAY (Patient taking differently: Take 5 mg by mouth 3 (three) times daily.) 270 tablet 1 04/23/2023 at am   dexamethasone (DECADRON) 2 MG tablet Take 1 tablet (2 mg total) by mouth daily. 30 tablet 0 04/23/2023 at am   levETIRAcetam (KEPPRA) 500 MG tablet Take 1 tablet (500 mg total) by mouth 2 (two) times daily. 30 tablet 0 04/23/2023 at am   losartan (COZAAR) 50 MG tablet TAKE 1 TABLET BY MOUTH EVERY DAY (Patient taking differently: Take 50 mg by mouth daily.) 90 tablet 1 04/23/2023 at am   meloxicam (MOBIC) 7.5 MG tablet Take 7.5 mg by mouth daily.   04/23/2023   NEO-SYNEPHRINE COLD/ALLRGY EXT 1 % nasal spray Place 1 drop into both nostrils every 6 (six) hours as needed for congestion.   Past Week   ondansetron (ZOFRAN) 8 MG tablet Take 1 tablet (8 mg total) by mouth every 8 (eight) hours as needed for nausea or vomiting. May take 30-60 minutes prior to Temodar administration if nausea/vomiting occurs as needed. 30 tablet 1 unk   prochlorperazine (COMPAZINE) 10 MG tablet Take 1 tablet (10 mg total) by mouth every 6 (six) hours as needed for nausea or vomiting. 30 tablet 1 unk   SUMAtriptan (IMITREX) 50 MG tablet Take 50 mg by mouth every 2 (two) hours  as needed for migraine. May repeat in 2 hours if headache persists or recurs.   unk   temozolomide (TEMODAR) 250 MG capsule Take 1 capsule (250 mg total) by mouth daily. Take for 5 days on, 23 days off. Repeat every 28 days. May take on an empty stomach to decrease nausea & vomiting. 5 capsule 0 Past Month at SEE note   topiramate (TOPAMAX) 50 MG tablet TAKE 1 TABLET BY MOUTH TWICE A DAY (Patient taking differently: Take 50 mg by mouth 2 (two) times daily.) 60 tablet 0 04/23/2023   traZODone (DESYREL) 50 MG tablet Take  1 tablet (50 mg total) by mouth at bedtime as needed for sleep. 30 tablet 2 unk   fluticasone (FLONASE) 50 MCG/ACT nasal spray SPRAY 2 SPRAYS INTO EACH NOSTRIL EVERY DAY (Patient not taking: Reported on 04/23/2023) 48 mL 0 Not Taking   traMADol (ULTRAM) 50 MG tablet Take 50 mg by mouth every 8 (eight) hours as needed (for pain).   unk     Vitals   Vitals:   04/24/23 0400 04/24/23 0500 04/24/23 0654 04/24/23 0956  BP: 130/83 127/65 138/80 (!) 138/59  Pulse: 71 67 78 98  Resp: 16  16 17   Temp:   97.6 F (36.4 C) 98.1 F (36.7 C)  TempSrc:   Oral Oral  SpO2: 98% 95% 100% 98%     There is no height or weight on file to calculate BMI.  Physical Exam   General: Laying comfortably in bed; in no acute distress.  HENT: Normal oropharynx and mucosa. Normal external appearance of ears and nose.  Neck: Supple, no pain or tenderness  CV: No JVD. No peripheral edema.  Pulmonary: Symmetric Chest rise. Normal respiratory effort.  Abdomen: Soft to touch, non-tender.  Ext: No cyanosis, edema, or deformity  Skin: No rash. Normal palpation of skin.   Musculoskeletal: Normal digits and nails by inspection. No clubbing.   Neurologic Examination  Mental status/Cognition: Alert, oriented to self, place, but not to month. Oriented to year, poor attention.  Speech/language: non fluent, makes paraphasic errors and takes several pauses whil speaking, dysarthric, comprehension intact to simple  commands intermittently, object naming intact, repetition intact. Cranial nerves:   CN II Pupils equal and reactive to light, no VF deficits    CN III,IV,VI EOM intact, no gaze preference or deviation, no nystagmus    CN V normal sensation in V1, V2, and V3 segments bilaterally    CN VII no asymmetry, no nasolabial fold flattening    CN VIII normal hearing to speech    CN IX & X normal palatal elevation, no uvular deviation    CN XI 5/5 head turn and 5/5 shoulder shrug bilaterally    CN XII midline tongue protrusion    Motor:  Muscle bulk: normal, tone flaccid in RUE Mvmt Root Nerve  Muscle Right Left Comments  SA C5/6 Ax Deltoid 0 5   EF C5/6 Mc Biceps 0 5   EE C6/7/8 Rad Triceps 0 5   WF C6/7 Med FCR     WE C7/8 PIN ECU     F Ab C8/T1 U ADM/FDI 0 5   HF L1/2/3 Fem Illopsoas 4 4+   KE L2/3/4 Fem Quad 1 5   DF L4/5 D Peron Tib Ant 1 5   PF S1/2 Tibial Grc/Sol 1 5    Sensation:  Light touch Decreased in RUE and RLE.   Pin prick    Temperature    Vibration   Proprioception    Coordination/Complex Motor:  - Finger to Nose intact on the left - Heel to shin unable to do. - Rapid alternating movement unable to do with RUE - Gait: deferred for patient safety.  Labs   CBC:  Recent Labs  Lab 04/23/23 1323  WBC 9.0  NEUTROABS 8.1*  HGB 13.5  HCT 39.0  MCV 98.5  PLT 195    Basic Metabolic Panel:  Lab Results  Component Value Date   NA 140 04/23/2023   K 4.0 04/23/2023   CO2 20 (L) 04/23/2023   GLUCOSE 127 (H) 04/23/2023  BUN 27 (H) 04/23/2023   CREATININE 1.01 (H) 04/23/2023   CALCIUM 9.0 04/23/2023   GFRNONAA >60 04/23/2023   GFRAA 70 10/04/2018   Lipid Panel:  Lab Results  Component Value Date   LDLCALC 95 04/23/2023   HgbA1c:  Lab Results  Component Value Date   HGBA1C 5.3 04/23/2023   Urine Drug Screen:     Component Value Date/Time   LABOPIA NONE DETECTED 04/23/2023 2155   COCAINSCRNUR NONE DETECTED 04/23/2023 2155   LABBENZ NONE DETECTED  04/23/2023 2155   AMPHETMU NONE DETECTED 04/23/2023 2155   THCU NONE DETECTED 04/23/2023 2155   LABBARB NONE DETECTED 04/23/2023 2155    Alcohol Level     Component Value Date/Time   ETH <10 04/23/2023 1620    MRI Brain(Personally reviewed): Interval increase in size of contrast-enhancing tumor centered in the left frontal lobe, as well as the surrounding T2/FLAIR hyperintense signal abnormality which now extends posteriorly to the level of the postcentral gyrus. There is also new 3 mm rightward midline shift.  cEEG:  pending  Impression   KIAUNA TULP is a 67 y.o. female with PMH significant for with PMH significant for HLD, HTN, migraine, GBM s/p resection who presents with R sided weakness that has progressively worsened over the last 10 days, much more worse over last 3 days. Her neurologic examination is notable for RUE plegic and RLE weakness.  Twitching movements on the right stopped since she was started on Keppra. Son worried that she has worsened since being on Keppra and requesting alternative agent.  Recommendations  - Stop Keppra. - Load with Vimpat 200mg  IV once, followed by Vimpat 50mg  BID - LTM EEG to evaluate for any subclinical seizures that could be contributing to her profound weakness. - agree with steroids. - agree with Neurosurg and Neuro-onc consults. ______________________________________________________________________   Thank you for the opportunity to take part in the care of this patient. If you have any further questions, please contact the neurology consultation attending.  Signed,  Erick Blinks [facility] _ _ _   _ __   _ __ _ _  __ __   _ __   __ _

## 2023-04-24 NOTE — Progress Notes (Signed)
LTM EEG hooked up and running - no initial skin breakdown - push button tested - [facility].  

## 2023-04-24 NOTE — Evaluation (Signed)
Speech Language Pathology Evaluation Patient Details Name: Jennifer [name] MRN: [id number] DOB: 1956/03/30 Today's Date: 04/24/2023 Time: 0912-0935 SLP Time Calculation (min) (ACUTE ONLY): 23 min  Problem List:  Patient Active Problem List   Diagnosis Date Noted   Acute right hemiparesis (HCC) 04/23/2023   Left-sided weakness 04/23/2023   Seizure disorder (HCC) 04/23/2023   Migraine 04/23/2023   Focal seizures (HCC) 03/31/2023   Glioblastoma multiforme of frontal lobe (HCC) 04/09/2022   Essential hypertension 06/13/2015   Bilateral chronic knee pain 10/14/2013   Chronic bilateral low back pain without sciatica 11/01/2012   S/P laparoscopic appendectomy 07/28/2012   Migraines 09/24/2011   Hyperlipidemia 09/24/2011   Past Medical History:  Past Medical History:  Diagnosis Date   Brain cancer (HCC)    Hyperlipidemia    Hypertension    Migraine    Past Surgical History:  Past Surgical History:  Procedure Laterality Date   APPENDECTOMY     APPLICATION OF CRANIAL NAVIGATION N/A 04/11/2022   Procedure: APPLICATION OF CRANIAL NAVIGATION;  Surgeon: Coletta Memos, MD;  Location: [facility];  Service: Neurosurgery;  Laterality: N/A;   BRAIN SURGERY     CRANIOTOMY Left 04/11/2022   Procedure: LEFT FRONTAL CRANIOTOMY FOR RESECTION OF TUMOR WITH [name];  Surgeon: Coletta Memos, MD;  Location: [facility];  Service: Neurosurgery;  Laterality: Left;  RM 21   LAPAROSCOPIC APPENDECTOMY  07/15/2012   Procedure: APPENDECTOMY LAPAROSCOPIC;  Surgeon: Liz Malady, MD;  Location: [facility];  Service: General;  Laterality: N/A;  Laparoscopic Appendectomy   HPI:  Jennifer [name] is a 67 y.o. 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  She was seen for new onset seizures on 8/18 and was sent home Keppra  and follow off with oncology.  Her oncologist ordered brain MRI with and without contrast which has not been completed. She has been having dysarthria and expressive dysphasia. MRI Interval increase in size of contrast-enhancing tumor centered in the left frontal lobe, as well as the surrounding T2/FLAIR hyperintense signal abnormality which now extends posteriorly to the level of the postcentral gyrus.   Assessment / Plan / Recommendation Clinical Impression  Pt's son states his mom has been having word finding difficulties for awhile. She lives with her daughter who is autistic and son states daughter helps to manage meds and son helps with finances. She exhibits moderate cognitive-linguistic deficits. She conversed with therapist with mildly decreased semantics and dysnomia and could only state 2 animals in one minute. On the SLUMS she scored a 17/30 with impairments in problem solving, awareness, and memory. Her processing of information was delayed at times and her pragmatics decreased (sometimes laughing inappropriately). Her speech is 100% intelligible. Pt could benefit from continued ST on acute and on inpatient rehab unit for communication and cognition.    SLP Assessment  SLP Recommendation/Assessment: Patient needs continued Speech Lanaguage Pathology Services SLP Visit Diagnosis: Cognitive communication deficit (R41.841)    Recommendations for follow up therapy are one component of a multi-disciplinary discharge planning process, led by the attending physician.  Recommendations may be updated based on patient status, additional functional criteria and insurance authorization.    Follow Up Recommendations  Acute inpatient rehab (3hours/day)    Assistance Recommended at Discharge  Frequent or constant Supervision/Assistance  Functional Status Assessment Patient has had a recent decline in their functional status  and demonstrates the ability to make significant improvements in function in a  reasonable and predictable amount of time.  Frequency and Duration min 2x/week  2 weeks      SLP Evaluation Cognition  Overall Cognitive Status: Impaired/Different from baseline Arousal/Alertness: Awake/alert Orientation Level: Oriented to person;Oriented to place;Oriented to time Year: 2024 Month: August Day of Week: Correct Attention: Sustained Sustained Attention: Appears intact Memory: Impaired Memory Impairment:  (3/5) Awareness: Impaired Awareness Impairment: Intellectual impairment;Emergent impairment;Anticipatory impairment Problem Solving: Impaired Problem Solving Impairment: Functional basic Safety/Judgment: Impaired       Comprehension  Auditory Comprehension Overall Auditory Comprehension: Appears within functional limits for tasks assessed Visual Recognition/Discrimination Discrimination: Not tested Reading Comprehension Reading Status: Not tested    Expression Expression Primary Mode of Expression: Verbal Verbal Expression Overall Verbal Expression: Impaired Level of Generative/Spontaneous Verbalization: Sentence Repetition:  (NT) Naming: Impairment Divergent:  (named 2 animals) Pragmatics: Impairment Impairments: Other (comment) (laughs inappropriately at times) Written Expression Dominant Hand: Right Written Expression: Not tested   Oral / Motor  Oral Motor/Sensory Function Overall Oral Motor/Sensory Function: Within functional limits Motor Speech Overall Motor Speech: Appears within functional limits for tasks assessed Respiration: Within functional limits Phonation: Normal Resonance: Within functional limits Articulation: Within functional limitis Intelligibility: Intelligible Motor Planning: Witnin functional limits            [name] 04/24/2023, 10:00 AM

## 2023-04-25 DIAGNOSIS — R531 Weakness: Secondary | ICD-10-CM | POA: Diagnosis not present

## 2023-04-25 DIAGNOSIS — R569 Unspecified convulsions: Secondary | ICD-10-CM | POA: Diagnosis not present

## 2023-04-25 LAB — BASIC METABOLIC PANEL
Anion gap: 7 (ref 5–15)
BUN: 24 mg/dL — ABNORMAL HIGH (ref 8–23)
CO2: 19 mmol/L — ABNORMAL LOW (ref 22–32)
Calcium: 8.8 mg/dL — ABNORMAL LOW (ref 8.9–10.3)
Chloride: 113 mmol/L — ABNORMAL HIGH (ref 98–111)
Creatinine, Ser: 1.35 mg/dL — ABNORMAL HIGH (ref 0.44–1.00)
GFR, Estimated: 43 mL/min — ABNORMAL LOW (ref >60)
Glucose, Bld: 123 mg/dL — ABNORMAL HIGH (ref 70–99)
Potassium: 3.7 mmol/L (ref 3.5–5.1)
Sodium: 139 mmol/L (ref 135–145)

## 2023-04-25 MED ORDER — STERILE WATER FOR INJECTION IV SOLN
INTRAVENOUS | Status: DC
  Filled 2023-04-25 (×3): qty 1000

## 2023-04-25 NOTE — Evaluation (Signed)
Occupational Therapy Evaluation Patient Details Name: Jennifer [name] MRN: [id number] DOB: 27-Feb-1956 Today's Date: 04/25/2023   History of Present Illness Patient is a 67 y.o. female admitted 8/29 for right sided weakness. 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   Clinical Impression   PTA, pt lived with daughter and was independent in ADL and IADL; does not drive. Upon eval, pt eager to mobilize. Pt presenting with RUE flaccidity, RLE weakness, decreased awareness of R side of body, decr sensation, balance, safety, awareness, impulsivity, and expressive communication. Pt performing STS transfers with stedy with min A and max cues for safety during placement of stedy and for awareness of R side during mobility. Pt standing in stedy at sink for oral care and washing face with OT facilitating weightbearing on surface with RUE at sink with min A for balance and total A for placement of RUE. Due to significant change in functional status and excellent family support, recommending intensive multidisciplinary rehabilitation >3 hours/day to optimize safety and independence in ADL.        If plan is discharge home, recommend the following: A lot of help with walking and/or transfers;Two people to help with walking and/or transfers;A lot of help with bathing/dressing/bathroom;Assistance with cooking/housework;Assistance with feeding;Direct supervision/assist for medications management;Direct supervision/assist for financial management;Assist for transportation;Help with stairs or ramp for entrance    Functional Status Assessment  Patient has had a recent decline in their functional status and demonstrates the ability to make significant improvements in function in a reasonable and predictable amount  of time.  Equipment Recommendations  Other (comment) (defer)    Recommendations for Other Services Rehab consult     Precautions / Restrictions Precautions Precautions: Fall Restrictions Weight Bearing Restrictions: No      Mobility Bed Mobility Overal bed mobility: Needs Assistance Bed Mobility: Sit to Supine       Sit to supine: Min assist   General bed mobility comments: to manage RUE and bring RLE into bed    Transfers Overall transfer level: Needs assistance Equipment used: Ambulation equipment used Transfers: Sit to/from Stand Sit to Stand: Min assist           General transfer comment: Min A up with stedy      Balance Overall balance assessment: Needs assistance Sitting-balance support: Feet supported, No upper extremity supported Sitting balance-Leahy Scale: Fair     Standing balance support: Single extremity supported Standing balance-Leahy Scale: Poor                             ADL either performed or assessed with clinical judgement   ADL Overall ADL's : Needs assistance/impaired Eating/Feeding: Minimal assistance;Sitting   Grooming: Moderate assistance;Oral care;Standing Grooming Details (indicate cue type and reason): mod A for toothbrush prep, etc. Min A for standing balance to wash face and control sink. Standing in stedy with OT facilitating weightbearing through RUE Upper Body Bathing: Moderate assistance   Lower Body Bathing: Moderate assistance   Upper Body Dressing : Moderate assistance   Lower Body Dressing: Maximal assistance   Toilet Transfer: Minimal assistance Toilet Transfer Details (indicate cue type and reason): up with stedy         Functional mobility during ADLs: Minimal assistance General ADL Comments: up with stedy this session with +1 min A;  constant cues due to impulsivity and max decr safety as well as decr awareness of R arm and leg (max cues to attempt to bring RLE onto stedy platform)      Vision Baseline Vision/History: 1 Wears glasses Ability to See in Adequate Light: 0 Adequate Patient Visual Report: No change from baseline Vision Assessment?: No apparent visual deficits     Perception Perception: Impaired Preception Impairment Details: Inattention/Neglect Perception-Other Comments: decr attention to R side of body   Praxis         Pertinent Vitals/Pain Pain Assessment Pain Assessment: Faces Faces Pain Scale: No hurt Pain Intervention(s): Monitored during session     Extremity/Trunk Assessment Upper Extremity Assessment Upper Extremity Assessment: Right hand dominant;RUE deficits/detail RUE Deficits / Details: no active movement noted RUE Sensation:  (Pt reports she can feel OT touching her; inconsistency with R/ L report due to aphasia. Does have significantly decreased awareness of location and safety of RUE) RUE Coordination: decreased fine motor;decreased gross motor   Lower Extremity Assessment Lower Extremity Assessment: Defer to PT evaluation       Communication Communication Communication: Difficulty communicating thoughts/reduced clarity of speech Cueing Techniques: Verbal cues   Cognition Arousal: Alert Behavior During Therapy: Lability, Impulsive Overall Cognitive Status: Impaired/Different from baseline Area of Impairment: Following commands, Safety/judgement, Problem solving                       Following Commands: Follows one step commands consistently, Follows multi-step commands inconsistently Safety/Judgement: Decreased awareness of safety, Decreased awareness of deficits   Problem Solving: Slow processing, Requires verbal cues, Requires tactile cues General Comments: patient is able to follow single step commands consistently with occasional verbal cues. she has difficulty with word finding and often laughs when she is unable to find the word. Tactile cues for mobility and safety due to impulsivity     General  Comments       Exercises     Shoulder Instructions      Home Living Family/patient expects to be discharged to:: Private residence Living Arrangements: Children Available Help at Discharge: Family Type of Home: House Home Access: Stairs to enter Secretary/administrator of Steps: 3 Entrance Stairs-Rails: Right;Left Home Layout: One level     Bathroom Shower/Tub: Producer, television/film/video: Handicapped height     Home Equipment: Agricultural consultant (2 wheels);Cane - single point;Wheelchair - manual      Lives With: Daughter    Prior Functioning/Environment Prior Level of Function : Independent/Modified Independent             Mobility Comments: patient reports she is independent at baseline without assistive device, does not drive ADLs Comments: patient reports independent at baseline, does not drive        OT Problem List: Decreased strength;Decreased activity tolerance;Impaired balance (sitting and/or standing);Decreased range of motion;Impaired vision/perception;Decreased coordination;Decreased cognition;Decreased safety awareness;Decreased knowledge of use of DME or AE;Decreased knowledge of precautions;Impaired sensation;Impaired UE functional use      OT Treatment/Interventions: Self-care/ADL training;Therapeutic exercise;DME and/or AE instruction;Balance training;Patient/family education;Therapeutic activities;Cognitive remediation/compensation    OT Goals(Current goals can be found in the care plan section) Acute Rehab OT Goals Patient Stated Goal: get better OT Goal Formulation: With patient Time For Goal Achievement: 05/09/23 Potential to Achieve Goals: Good  OT Frequency: Min 2X/week    Co-evaluation              AM-PAC OT "6 Clicks" Daily Activity     Outcome Measure Help  from another person eating meals?: A Little Help from another person taking care of personal grooming?: A Lot Help from another person toileting, which includes using  toliet, bedpan, or urinal?: A Lot Help from another person bathing (including washing, rinsing, drying)?: A Lot Help from another person to put on and taking off regular upper body clothing?: A Lot Help from another person to put on and taking off regular lower body clothing?: A Lot 6 Click Score: 13   End of Session Equipment Utilized During Treatment: Gait belt;Rolling walker (2 wheels) Nurse Communication: Mobility status  Activity Tolerance: Patient tolerated treatment well Patient left: in bed;with call bell/phone within reach;with bed alarm set;with family/visitor present;with nursing/sitter in room  OT Visit Diagnosis: Unsteadiness on feet (R26.81);Muscle weakness (generalized) (M62.81);Other symptoms and signs involving cognitive function;Cognitive communication deficit (R41.841);Hemiplegia and hemiparesis Hemiplegia - Right/Left: Right Hemiplegia - dominant/non-dominant: Dominant                Time: 9629-5284 OT Time Calculation (min): 35 min Charges:  OT General Charges $OT Visit: 1 Visit OT Evaluation $OT Eval Moderate Complexity: 1 Mod OT Treatments $Self Care/Home Management : 8-22 mins  Tyler Deis, OTR/L [facility] Acute Rehabilitation Office: [phone]   Myrla Halsted 04/25/2023, 5:46 PM

## 2023-04-25 NOTE — Plan of Care (Signed)

## 2023-04-25 NOTE — Progress Notes (Signed)
NEUROLOGY CONSULTATION PROGRESS NOTE   Date of service: April 25, 2023 [name] MRN:  [id number] DOB:  04-Jul-1956  Brief HPI  Jennifer [name] is a 67 y.o. female with PMH significant for with PMH significant for HLD, HTN, migraine, GBM s/p resection who presents with R sided weakness that has progressively worsened over the last 10 days, much more worse over last 3 days. Her neurologic examination is notable for RUE plegic and RLE weakness.   Interval Hx   Neuro exam unchanged from yesterday. No seizures on LTM overnight. Tolerating Vimpat without any issues.  Vitals   Vitals:   04/24/23 2025 04/25/23 0513 04/25/23 0847 04/25/23 1211  BP: 134/69 130/65 (!) 144/79 125/84  Pulse: 97 97 88 94  Resp: 16  16 18   Temp: (!) 97.5 F (36.4 C) 98 F (36.7 C) 98.3 F (36.8 C) 97.9 F (36.6 C)  TempSrc: Oral Oral Oral Oral  SpO2: 98% 98% 98% 100%     There is no height or weight on file to calculate BMI.  Physical Exam   General: Laying comfortably in bed; in no acute distress.  HENT: Normal oropharynx and mucosa. Normal external appearance of ears and nose.  Neck: Supple, no pain or tenderness  CV: No JVD. No peripheral edema.  Pulmonary: Symmetric Chest rise. Normal respiratory effort.  Abdomen: Soft to touch, non-tender.  Ext: No cyanosis, edema, or deformity  Skin: No rash. Normal palpation of skin.   Musculoskeletal: Normal digits and nails by inspection. No clubbing.  Neurologic Examination  Mental status/Cognition: Alert, oriented to self, place, but not to month. Oriented to year, poor attention.  Speech/language: non fluent, makes paraphasic errors and takes several pauses whil speaking, dysarthric, comprehension intact to simple commands intermittently, object naming intact, repetition intact. Inappropriate laughter at times may be from steroids. Cranial nerves:   CN II Pupils equal and reactive to light, no VF deficits    CN III,IV,VI EOM intact, no  gaze preference or deviation, no nystagmus    CN V normal sensation in V1, V2, and V3 segments bilaterally    CN VII no asymmetry, no nasolabial fold flattening    CN VIII normal hearing to speech    CN IX & X normal palatal elevation, no uvular deviation    CN XI 5/5 head turn and 5/5 shoulder shrug bilaterally    CN XII midline tongue protrusion     Motor:  Muscle bulk: normal, tone flaccid in RUE Mvmt Root Nerve  Muscle Right Left Comments  SA C5/6 Ax Deltoid 0 5    EF C5/6 Mc Biceps 0 5    EE C6/7/8 Rad Triceps 0 5    WF C6/7 Med FCR        WE C7/8 PIN ECU        F Ab C8/T1 U ADM/FDI 0 5    HF L1/2/3 Fem Illopsoas 4 4+    KE L2/3/4 Fem Quad 1 5    DF L4/5 D Peron Tib Ant 1 5    PF S1/2 Tibial Grc/Sol 1 5      Sensation:  Light touch Decreased in RUE and RLE.   Pin prick     Temperature     Vibration    Proprioception      Coordination/Complex Motor:  - Finger to Nose intact on the left - Heel to shin unable to do. - Rapid alternating movement unable to do with RUE - Gait: deferred for patient  safety.   Labs   Basic Metabolic Panel:  Lab Results  Component Value Date   NA 139 04/25/2023   K 3.7 04/25/2023   CO2 19 (L) 04/25/2023   GLUCOSE 123 (H) 04/25/2023   BUN 24 (H) 04/25/2023   CREATININE 1.35 (H) 04/25/2023   CALCIUM 8.8 (L) 04/25/2023   GFRNONAA 43 (L) 04/25/2023   GFRAA 70 10/04/2018   HbA1c:  Lab Results  Component Value Date   HGBA1C 5.3 04/23/2023   LDL:  Lab Results  Component Value Date   LDLCALC 107 (H) 04/24/2023   Urine Drug Screen:     Component Value Date/Time   LABOPIA NONE DETECTED 04/23/2023 2155   COCAINSCRNUR NONE DETECTED 04/23/2023 2155   LABBENZ NONE DETECTED 04/23/2023 2155   AMPHETMU NONE DETECTED 04/23/2023 2155   THCU NONE DETECTED 04/23/2023 2155   LABBARB NONE DETECTED 04/23/2023 2155    Alcohol Level     Component Value Date/Time   ETH <10 04/23/2023 1620   No results found for: "PHENYTOIN", "ZONISAMIDE",  "LAMOTRIGINE", "LEVETIRACETA" No results found for: "PHENYTOIN", "PHENOBARB", "VALPROATE", "CBMZ"  Imaging and Diagnostic studies   MRI Brain(Personally reviewed): Interval increase in size of contrast-enhancing tumor centered in the left frontal lobe, as well as the surrounding T2/FLAIR hyperintense signal abnormality which now extends posteriorly to the level of the postcentral gyrus. There is also new 3 mm rightward midline shift.  LTM EEG(8/30-8/31): This study is suggestive of cortical dysfunction arising from left hemisphere likely secondary to underlying tumor. Additionally there is cortical dysfunction in left centro-temporal region consistent with prior craniotomy. No seizures or definite epileptiform discharges were seen throughout the recording.  Impression   Jennifer [name] is a 67 y.o. female with PMH significant for with PMH significant for HLD, HTN, migraine, GBM s/p resection who presents with R sided weakness that has progressively worsened over the last 10 days, much more worse over last 3 days. Her neurologic examination is notable for RUE plegic and RLE weakness.  MRI brain with ?progression of tumor althou mild. Put on LTM EEG with no seizures overnight. Her exam is unchanged today. If her weakness was post ictal, would expect her to improve.  Recommendations  - continue Vimpat 50 BID - no seizures on LTM. Will leave her on LTM overnight. ______________________________________________________________________  Plan discussed with patient and her son at bedside and with Dr. Mahala Menghini later in person.  Thank you for the opportunity to take part in the care of this patient. If you have any further questions, please contact the neurology consultation attending.  Signed,  Erick Blinks [facility]

## 2023-04-25 NOTE — Progress Notes (Addendum)
67 yo F with GBM s/p resection by Dr. 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  Dr. Franky Macho informed me he plans on seeing the patient tomorrow to discuss treatment options and place formal consult.  Currently, patient is resting comfortably in bed with EEG leads on, speech grossly fluent.

## 2023-04-25 NOTE — Progress Notes (Signed)
Physical Therapy Treatment Patient Details Name: Jennifer [name] MRN: [id number] DOB: [birth year] Today's Date: 04/25/2023   History of Present Illness Patient is a 67 y.o. female admitted 8/29 for right sided weakness. 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    PT Comments  Tolerated well, eager to get OOB. Assisted with min A for bed mobility, mod A +2 for sit to stand transfer and assist with hygiene/pericare due to wet clothes and bed linens. Mod A + for stand pivot transfer towards left with Rt knee block due to buckling, RUE supported. Standing balance and weight shift training. Reviewed LE exercises in chair. Son present and supportive. Patient will continue to benefit from skilled physical therapy services to further improve independence with functional mobility.     If plan is discharge home, recommend the following: Two people to help with walking and/or transfers;A lot of help with bathing/dressing/bathroom;Assist for transportation;Help with stairs or ramp for entrance;Supervision due to cognitive status;Assistance with cooking/housework     Equipment Recommendations   (to be determined at next level of care)    Recommendations for Other Services Rehab consult     Precautions / Restrictions Precautions Precautions: Fall Restrictions Weight Bearing Restrictions: No     Mobility  Bed Mobility Overal bed mobility: Needs Assistance Bed Mobility: Supine to Sit     Supine to sit: Min assist     General bed mobility comments: Min assist for trunk support to rise to EOB with VC for sequencing and management of lines/leads.    Transfers Overall transfer level: Needs assistance Equipment used: 2 person hand held assist Transfers: Sit to/from Stand, Bed to  chair/wheelchair/BSC Sit to Stand: Mod assist, +2 physical assistance, +2 safety/equipment Stand pivot transfers: Mod assist, +2 physical assistance, +2 safety/equipment         General transfer comment: Mod assist +2 for boost and balance with Rt knee block and RUE support. Performed from bed several times. Progressed with pivot transfer towards Lt, with Mod A +2 for balance and facilitation of turn. Cues throughout. Rt knee buckles, and needs supported with WB.    Ambulation/Gait             Pre-gait activities: Weight shifting General Gait Details: Rt knee buckling. gait limited by EEG hook-up.   Stairs             Wheelchair Mobility     Tilt Bed    Modified Rankin (Stroke Patients Only)       Balance Overall balance assessment: Needs assistance Sitting-balance support: Feet supported, No upper extremity supported Sitting balance-Leahy Scale: Fair     Standing balance support: Single extremity supported Standing balance-Leahy Scale: Poor Standing balance comment: external support required.                            Cognition Arousal: Alert Behavior During Therapy: Lability, Impulsive Overall Cognitive Status: Impaired/Different from baseline Area of Impairment: Following commands, Safety/judgement, Problem solving                       Following Commands: Follows one step commands consistently, Follows multi-step commands inconsistently Safety/Judgement: Decreased awareness of safety, Decreased awareness of deficits   Problem Solving: Slow processing, Requires verbal cues, Requires tactile cues General Comments: patient is able  to follow single step commands consistently with occasional verbal cues. she has ddifficulty with word finding and often laughs when she is unable to find the word. Tactile cues for mobility        Exercises General Exercises - Lower Extremity Ankle Circles/Pumps: AROM, Both, 10 reps, Seated Quad  Sets: AROM, Strengthening, Both, 10 reps, Seated    General Comments General comments (skin integrity, edema, etc.): Assisted with peri-care, incontinent of urine vs pure-wick not turned on; assisted with hygiene and changed out of wet clothes. RN notified.      Pertinent Vitals/Pain Pain Assessment Pain Assessment: Faces Faces Pain Scale: Hurts little more Pain Location: back Pain Descriptors / Indicators: Aching Pain Intervention(s): Monitored during session, Repositioned, Limited activity within patient's tolerance    Home Living                          Prior Function            PT Goals (current goals can now be found in the care plan section) Acute Rehab PT Goals Patient Stated Goal: to feel better PT Goal Formulation: With patient Time For Goal Achievement: 05/08/23 Potential to Achieve Goals: Good Progress towards PT goals: Progressing toward goals    Frequency    Min 1X/week      PT Plan      Co-evaluation              AM-PAC PT "6 Clicks" Mobility   Outcome Measure  Help needed turning from your back to your side while in a flat bed without using bedrails?: A Lot Help needed moving from lying on your back to sitting on the side of a flat bed without using bedrails?: A Lot Help needed moving to and from a bed to a chair (including a wheelchair)?: A Lot Help needed standing up from a chair using your arms (e.g., wheelchair or bedside chair)?: A Lot Help needed to walk in hospital room?: Total Help needed climbing 3-5 steps with a railing? : Total 6 Click Score: 10    End of Session Equipment Utilized During Treatment: Gait belt Activity Tolerance: Patient tolerated treatment well Patient left: with call bell/phone within reach;in chair;with chair alarm set;with nursing/sitter in room Nurse Communication: Mobility status;Other (comment) (needs standard bed. air mattress in room, no inflated.) PT Visit Diagnosis: Difficulty in walking,  not elsewhere classified (R26.2);Other abnormalities of gait and mobility (R26.89)     Time: 4098-1191 PT Time Calculation (min) (ACUTE ONLY): 29 min  Charges:    $Therapeutic Activity: 23-37 mins PT General Charges $$ ACUTE PT VISIT: 1 Visit                     Kathlyn [place], PT, DPT [facility]  Rehabilitation Services Physical Therapist Office: [phone] Website: [email or web address]    Berton Mount 04/25/2023, 1:48 PM

## 2023-04-25 NOTE — Progress Notes (Addendum)
HOSPITALIST ROUNDING NOTE Jennifer [name] ZOX:[id number]  DOB: [birth year]  DOA: 04/23/2023  PCP: Irving Copas H, PA-C  04/25/2023,10:53 AM   LOS: 2 days      Code Status: Full   From: Home  current Dispo: Unclear     67 year old home dwelling female HTN HLD migraines GBM status postresection 04/17/2023 Had new onset seizures 8/18 and was sent home follow-up with oncology Presented with right-sided weakness mental status changes BUN/creatinine 27/1 Patient given Decadron neurology consulted send progressive bed  Plan  R-sided weakness in the setting of cerebral edema from GBM Defer to oncology and neurology and neuro-oncology Neurosurgery will formally discuss planning with paitent/family tomorrow Now on Vimpat after loading dosed at 50 twice daily, continuing Topamax 50 twice daily [ no more keppra] continues on Decadron 4 IV every 12 Continues on Temodar as per neuro-oncology  AKI on admission Mild met acidosis Start IV bicarb as worsening acidosis/Cr Repeat periodic labs  Migraine Continue sumatriptan 50 twice daily as needed migraine as well as Topamax  HTN continue losartan 50 daily, amlodipine 5 daily  Called son Gabriel Rung [phone] and updated  DVT prophylaxis: Lovenox at this time  Status is: Inpatient Remains inpatient appropriate because:   Requires further management    Subjective:  Cannot tell me the year is 24 and can tell me that it is "the last day of the month but cannot name the month Having some higher cognitive processing issues  Otherwise is very pleasant  Objective + exam Vitals:   04/24/23 1644 04/24/23 2025 04/25/23 0513 04/25/23 0847  BP: 125/85 134/69 130/65 (!) 144/79  Pulse: 86 97 97 88  Resp: 18 16  16   Temp: 97.8 F (36.6 C) (!) 97.5 F (36.4 C) 98 F (36.7 C) 98.3 F (36.8 C)  TempSrc: Oral Oral Oral Oral  SpO2: 100% 98% 98% 98%   There were no vitals filed for this visit.  Examination:  Patient on continuous EEG leads Smile  symmetric Power diminished 3/5 right upper extremity 3/5 lower extremity--reflexes deferred Abdomen soft S1-S2 no murmur Telemetry = PVCs  Data Reviewed: reviewed   CBC    Component Value Date/Time   WBC 9.0 04/23/2023 1323   RBC 3.96 04/23/2023 1323   HGB 13.5 04/23/2023 1323   HGB 13.7 03/31/2023 1113   HGB 14.2 03/15/2018 1123   HCT 39.0 04/23/2023 1323   HCT 44.0 03/15/2018 1123   PLT 195 04/23/2023 1323   PLT 168 03/31/2023 1113   PLT 346 03/15/2018 1123   MCV 98.5 04/23/2023 1323   MCV 94 03/15/2018 1123   MCH 34.1 (H) 04/23/2023 1323   MCHC 34.6 04/23/2023 1323   RDW 12.1 04/23/2023 1323   RDW 13.5 03/15/2018 1123   LYMPHSABS 0.5 (L) 04/23/2023 1323   LYMPHSABS 1.7 03/15/2018 1123   MONOABS 0.3 04/23/2023 1323   EOSABS 0.0 04/23/2023 1323   EOSABS 0.2 03/15/2018 1123   BASOSABS 0.0 04/23/2023 1323   BASOSABS 0.0 03/15/2018 1123      Latest Ref Rng & Units 04/25/2023    5:07 AM 04/23/2023    1:23 PM 04/12/2023    7:11 PM  CMP  Glucose 70 - 99 mg/dL 829  562  130   BUN 8 - 23 mg/dL 24  27  17    Creatinine 0.44 - 1.00 mg/dL 8.65  7.84  6.96   Sodium 135 - 145 mmol/L 139  140  143   Potassium 3.5 - 5.1 mmol/L 3.7  4.0  3.2  Chloride 98 - 111 mmol/L 113  112  117   CO2 22 - 32 mmol/L 19  20  15    Calcium 8.9 - 10.3 mg/dL 8.8  9.0  6.9   Total Protein 6.5 - 8.1 g/dL  6.6    Total Bilirubin 0.3 - 1.2 mg/dL  0.5    Alkaline Phos 38 - 126 U/L  62    AST 15 - 41 U/L  19    ALT 0 - 44 U/L  21       Scheduled Meds:  amLODipine  5 mg Oral Daily   atorvastatin  10 mg Oral Daily   busPIRone  5 mg Oral TID   dexamethasone (DECADRON) injection  4 mg Intravenous Q12H   enoxaparin (LOVENOX) injection  40 mg Subcutaneous Q24H   fluticasone  2 spray Each Nare Daily   lacosamide  50 mg Oral BID   losartan  50 mg Oral Daily   [START ON 04/29/2023] temozolomide  255 mg Oral Daily   topiramate  50 mg Oral BID   Continuous Infusions:  sodium bicarbonate 150 mEq in  sterile water 1,150 mL infusion 100 mL/hr at 04/25/23 8295    Time 24  Rhetta Mura, MD  [facility]

## 2023-04-25 NOTE — Progress Notes (Signed)
EEG maint complete.  ?

## 2023-04-25 NOTE — Procedures (Signed)
,  [name]  MRN: [id number]  Epilepsy Attending: Charlsie Quest  Referring Physician/Provider: Erick Blinks, MD  Duration: 04/24/2023 1455 to 04/25/2023 1455  Patient history: 67 y.o. female with PMH significant for with PMH significant for HLD, HTN, migraine, GBM s/p resection who presents with R sided weakness that has progressively worsened over the last 10 days, much more worse over last 3 days. Her neurologic examination is notable for RUE plegic and RLE weakness. EEG to evaluate for seizure  Level of alertness: Awake, asleep  AEDs during EEG study: TPM  Technical aspects: This EEG study was done with scalp electrodes positioned according to the 10-20 International system of electrode placement. Electrical activity was reviewed with band pass filter of 1-70Hz , sensitivity of 7 uV/mm, display speed of 43mm/sec with a 60Hz  notched filter applied as appropriate. EEG data were recorded continuously and digitally stored.  Video monitoring was available and reviewed as appropriate.  Description: The posterior dominant rhythm consists of 8 Hz activity of moderate voltage (25-35 uV) seen predominantly in posterior head regions, asymmetric ( left<right)  and reactive to eye opening and eye closing.  Sleep was characterized by vertex waves, sleep spindles (12 to 14 Hz), maximal frontocentral region. EEG showed continuous polymorphic 3 to 6 Hz theta-delta slowing in left hemisphere with overriding sharply contoured activity in left centro-temporal region consistent with breach artifact. Hyperventilation and photic stimulation were not performed.     ABNORMALITY - Continuous slow, left hemisphere - Breach artifact, left centro-temporal region  - Background asymmetry, left<right  IMPRESSION: This study is suggestive of cortical dysfunction arising from left hemisphere likely secondary to underlying tumor. Additionally there is cortical dysfunction in left centro-temporal region  consistent with prior craniotomy. No seizures or definite epileptiform discharges were seen throughout the recording.  Jennifer [name]

## 2023-04-26 DIAGNOSIS — R531 Weakness: Secondary | ICD-10-CM | POA: Diagnosis not present

## 2023-04-26 DIAGNOSIS — R569 Unspecified convulsions: Secondary | ICD-10-CM | POA: Diagnosis not present

## 2023-04-26 LAB — COMPREHENSIVE METABOLIC PANEL
ALT: 24 U/L (ref 0–44)
AST: 17 U/L (ref 15–41)
Albumin: 3.1 g/dL — ABNORMAL LOW (ref 3.5–5.0)
Alkaline Phosphatase: 49 U/L (ref 38–126)
Anion gap: 10 (ref 5–15)
BUN: 23 mg/dL (ref 8–23)
CO2: 23 mmol/L (ref 22–32)
Calcium: 8.7 mg/dL — ABNORMAL LOW (ref 8.9–10.3)
Chloride: 105 mmol/L (ref 98–111)
Creatinine, Ser: 1.02 mg/dL — ABNORMAL HIGH (ref 0.44–1.00)
GFR, Estimated: >60 mL/min (ref >60)
Glucose, Bld: 125 mg/dL — ABNORMAL HIGH (ref 70–99)
Potassium: 3.3 mmol/L — ABNORMAL LOW (ref 3.5–5.1)
Sodium: 138 mmol/L (ref 135–145)
Total Bilirubin: 0.6 mg/dL (ref 0.3–1.2)
Total Protein: 5.3 g/dL — ABNORMAL LOW (ref 6.5–8.1)

## 2023-04-26 LAB — CBC WITH DIFFERENTIAL/PLATELET
Abs Immature Granulocytes: 0.09 10*3/uL — ABNORMAL HIGH (ref 0.00–0.07)
Basophils Absolute: 0 10*3/uL (ref 0.0–0.1)
Basophils Relative: 0 %
Eosinophils Absolute: 0 10*3/uL (ref 0.0–0.5)
Eosinophils Relative: 0 %
HCT: 37.3 % (ref 36.0–46.0)
Hemoglobin: 13.2 g/dL (ref 12.0–15.0)
Immature Granulocytes: 1 %
Lymphocytes Relative: 12 %
Lymphs Abs: 1.1 10*3/uL (ref 0.7–4.0)
MCH: 34.9 pg — ABNORMAL HIGH (ref 26.0–34.0)
MCHC: 35.4 g/dL (ref 30.0–36.0)
MCV: 98.7 fL (ref 80.0–100.0)
Monocytes Absolute: 0.7 10*3/uL (ref 0.1–1.0)
Monocytes Relative: 7 %
Neutro Abs: 6.9 10*3/uL (ref 1.7–7.7)
Neutrophils Relative %: 80 %
Platelets: 151 10*3/uL (ref 150–400)
RBC: 3.78 MIL/uL — ABNORMAL LOW (ref 3.87–5.11)
RDW: 12.1 % (ref 11.5–15.5)
WBC: 8.8 10*3/uL (ref 4.0–10.5)
nRBC: 0 % (ref 0.0–0.2)

## 2023-04-26 MED ORDER — DEXAMETHASONE 2 MG PO TABS
2.0000 mg | ORAL_TABLET | Freq: Four times a day (QID) | ORAL | Status: DC
  Administered 2023-04-26 – 2023-04-29 (×13): 2 mg via ORAL
  Filled 2023-04-26 (×13): qty 1

## 2023-04-26 MED ORDER — POTASSIUM CHLORIDE CRYS ER 20 MEQ PO TBCR
40.0000 meq | EXTENDED_RELEASE_TABLET | Freq: Every day | ORAL | Status: DC
  Administered 2023-04-26 – 2023-04-29 (×4): 40 meq via ORAL
  Filled 2023-04-26 (×4): qty 2

## 2023-04-26 NOTE — Plan of Care (Signed)

## 2023-04-26 NOTE — Procedures (Signed)
[name]  MRN: [id number]  Epilepsy Attending: Charlsie Quest  Referring Physician/Provider: Erick Blinks, MD  Duration: 04/25/2023 1455 to 04/26/2023 1139   Patient history: 67 y.o. female with PMH significant for with PMH significant for HLD, HTN, migraine, GBM s/p resection who presents with R sided weakness that has progressively worsened over the last 10 days, much more worse over last 3 days. Her neurologic examination is notable for RUE plegic and RLE weakness. EEG to evaluate for seizure   Level of alertness: Awake, asleep   AEDs during EEG study: TPM   Technical aspects: This EEG study was done with scalp electrodes positioned according to the 10-20 International system of electrode placement. Electrical activity was reviewed with band pass filter of 1-70Hz , sensitivity of 7 uV/mm, display speed of 60mm/sec with a 60Hz  notched filter applied as appropriate. EEG data were recorded continuously and digitally stored.  Video monitoring was available and reviewed as appropriate.   Description: The posterior dominant rhythm consists of 8 Hz activity of moderate voltage (25-35 uV) seen predominantly in posterior head regions, asymmetric ( left<right)  and reactive to eye opening and eye closing.  Sleep was characterized by vertex waves, sleep spindles (12 to 14 Hz), maximal frontocentral region. EEG showed continuous polymorphic 3 to 6 Hz theta-delta slowing in left hemisphere with overriding sharply contoured activity in left centro-temporal region consistent with breach artifact. Hyperventilation and photic stimulation were not performed.      ABNORMALITY - Continuous slow, left hemisphere - Breach artifact, left centro-temporal region  - Background asymmetry, left<right   IMPRESSION: This study is suggestive of cortical dysfunction arising from left hemisphere likely secondary to underlying tumor. Additionally there is cortical dysfunction in left centro-temporal  region consistent with prior craniotomy. No seizures or definite epileptiform discharges were seen throughout the recording.   Deanglo Hissong Annabelle Harman

## 2023-04-26 NOTE — Progress Notes (Signed)
NEUROLOGY CONSULTATION PROGRESS NOTE   Date of service: April 26, 2023 [name] MRN:  [id number] DOB:  [birth year]  Brief HPI  AVAA DITOMASO is a 67 y.o. female with PMH significant for with PMH significant for HLD, HTN, migraine, GBM s/p resection who presents with R sided weakness that has progressively worsened over the last 10 days, much more worse over last 3 days. Her neurologic examination is notable for RUE plegic and RLE weakness.   Interval Hx   Neuro exam improved. No seizures on LTM.  Vitals   Vitals:   04/26/23 0030 04/26/23 0744 04/26/23 1239 04/26/23 1716  BP: 110/64 139/71 123/83 129/71  Pulse: 76 83 97 82  Resp: 18 18 19 19   Temp: 98.1 F (36.7 C) 98.2 F (36.8 C) 99.1 F (37.3 C) 98.5 F (36.9 C)  TempSrc: Axillary Oral Oral Oral  SpO2: 98% 98% 97% 98%     There is no height or weight on file to calculate BMI.  Physical Exam   General: Laying comfortably in bed; in no acute distress.  HENT: Normal oropharynx and mucosa. Normal external appearance of ears and nose.  Neck: Supple, no pain or tenderness  CV: No JVD. No peripheral edema.  Pulmonary: Symmetric Chest rise. Normal respiratory effort.  Abdomen: Soft to touch, non-tender.  Ext: No cyanosis, edema, or deformity  Skin: No rash. Normal palpation of skin.   Musculoskeletal: Normal digits and nails by inspection. No clubbing.  Neurologic Examination  Mental status/Cognition: Alert, oriented to self, place, but not to month. Oriented to year, poor attention.  Speech/language: non fluent, makes paraphasic errors and takes several pauses whil speaking, dysarthric, comprehension intact to simple commands intermittently, object naming intact, repetition intact. Inappropriate laughter at times may be from steroids. Cranial nerves:   CN II Pupils equal and reactive to light, no VF deficits    CN III,IV,VI EOM intact, no gaze preference or deviation, no nystagmus    CN V normal  sensation in V1, V2, and V3 segments bilaterally    CN VII no asymmetry, no nasolabial fold flattening    CN VIII normal hearing to speech    CN IX & X normal palatal elevation, no uvular deviation    CN XI 5/5 head turn and 5/5 shoulder shrug bilaterally    CN XII midline tongue protrusion     Motor:  Muscle bulk: normal, tone flaccid in RUE Mvmt Root Nerve  Muscle Right Left Comments  SA C5/6 Ax Deltoid 3 5    EF C5/6 Mc Biceps 4 5    EE C6/7/8 Rad Triceps 4 5    WF C6/7 Med FCR        WE C7/8 PIN ECU        F Ab C8/T1 U ADM/FDI 4 5    HF L1/2/3 Fem Illopsoas 4 4+    KE L2/3/4 Fem Quad 3 5    DF L4/5 D Peron Tib Ant 4 5    PF S1/2 Tibial Grc/Sol 4 5      Sensation:  Light touch Intact throughout   Pin prick     Temperature     Vibration    Proprioception      Coordination/Complex Motor:  - Finger to Nose intact on the left - Heel to shin unable to do. - Rapid alternating movement slowed in RUE. - Gait: deferred for patient safety.   Labs   Basic Metabolic Panel:  Lab Results  Component Value  Date   NA 138 04/26/2023   K 3.3 (L) 04/26/2023   CO2 23 04/26/2023   GLUCOSE 125 (H) 04/26/2023   BUN 23 04/26/2023   CREATININE 1.02 (H) 04/26/2023   CALCIUM 8.7 (L) 04/26/2023   GFRNONAA >60 04/26/2023   GFRAA 70 10/04/2018   HbA1c:  Lab Results  Component Value Date   HGBA1C 5.3 04/23/2023   LDL:  Lab Results  Component Value Date   LDLCALC 107 (H) 04/24/2023   Urine Drug Screen:     Component Value Date/Time   LABOPIA NONE DETECTED 04/23/2023 2155   COCAINSCRNUR NONE DETECTED 04/23/2023 2155   LABBENZ NONE DETECTED 04/23/2023 2155   AMPHETMU NONE DETECTED 04/23/2023 2155   THCU NONE DETECTED 04/23/2023 2155   LABBARB NONE DETECTED 04/23/2023 2155    Alcohol Level     Component Value Date/Time   ETH <10 04/23/2023 1620   No results found for: "PHENYTOIN", "ZONISAMIDE", "LAMOTRIGINE", "LEVETIRACETA" No results found for: "PHENYTOIN", "PHENOBARB",  "VALPROATE", "CBMZ"  Imaging and Diagnostic studies   MRI Brain(Personally reviewed): Interval increase in size of contrast-enhancing tumor centered in the left frontal lobe, as well as the surrounding T2/FLAIR hyperintense signal abnormality which now extends posteriorly to the level of the postcentral gyrus. There is also new 3 mm rightward midline shift.  LTM EEG(8/31-9/1): This study is suggestive of cortical dysfunction arising from left hemisphere likely secondary to underlying tumor. Additionally there is cortical dysfunction in left centro-temporal region consistent with prior craniotomy. No seizures or definite epileptiform discharges were seen throughout the recording.   Impression   Jennifer [name] is a 67 y.o. female with PMH significant for with PMH significant for HLD, HTN, migraine, GBM s/p resection who presents with R sided weakness that has progressively worsened over the last 10 days, much more worse over last 3 days. Her neurologic examination is notable for RUE and RLE weakness. This seems to have significantly improved over the last day. MRI brain with ?progression of tumor althou mild. LTM EEG is negative for seizures so far.  Improvement in R sided weakness could be due to potential post ictal todds or improvement in edema due to steroids.  Recommendations  - continue Vimpat 50 BID - discontinue LTM. - PT and OT - management of tumor progression and edema per Neuro onc and Neurosurgery. ______________________________________________________________________  Plan discussed with patient and her daughter in law at bedside and with Dr. Mahala Menghini later in person.  Thank you for the opportunity to take part in the care of this patient. If you have any further questions, please contact the neurology consultation attending.  Signed,  Erick Blinks [facility]

## 2023-04-26 NOTE — Progress Notes (Signed)
Patient readmitted with right-sided weakness likely secondary to recent seizure.  Patient is status post prior left frontal craniotomy and debulking of glioblastoma.  Follow-up MRI scan with extensive left frontal edema with 3 cm recurrent glioblastoma.  Continue antiepileptic medications and steroids.  Dr. Franky Macho to reevaluate

## 2023-04-26 NOTE — Progress Notes (Signed)
LTM EEG discontinued - no skin breakdown at unhook.   

## 2023-04-26 NOTE — Progress Notes (Signed)
HOSPITALIST ROUNDING NOTE Jennifer [name] QMV:[id number]  DOB: March 11, 1956  DOA: 04/23/2023  PCP: Irving Copas H, PA-C  04/26/2023,11:21 AM   LOS: 3 days      Code Status: Full   From: Home  current Dispo: Unclear     67 year old home dwelling female HTN HLD migraines GBM status postresection 04/16/2022 Sees neuro oncologist Dr. Barbaraann Cao is on concurrent Temodar with maintenance Temodar as well Was seen in clinic by Dr. Barbaraann Cao 03/31/2023 last-had completed 6 cycles of Temodar but had progression of disease apparently on MR Presented to [facility] 8/29 with full right-sided deficit CT showed vasogenic edema MRI showed increasing size of tumor--- telemetry-neurology evaluated the patient and recommended transfer to [facility] Presented with right-sided weakness mental status changes 8/30 neurology and neuro-oncology consulted--neurosurgery consulted 8/31 neurosurgery seen patient awaiting formal planning BUN/creatinine 27/1 Patient given Decadron neurology consulted sent to progressive bed  Plan  R-sided weakness in the setting of cerebral edema from GBM Neurosurgery/Neuro-oncology to decide final plan--await insight from Dr. Franky Macho, whom I have spoken with at length with on 9/1, and he will be by to discuss scenario, options with patient and family--much appreciated Vimpat per Neurology 50 twice daily, continuing Topamax 50 twice daily [ no more keppra] continues on Decadron 4 IV every 12 Continues on Temodar as per neuro-oncology  AKI on admission Mild met acidosis improved  Migraine Continue sumatriptan 50 twice daily as needed migraine as well as Topamax  HTN continue losartan 50 daily, amlodipine 5 daily  D/w d-I-l- at [facility]  DVT prophylaxis: Lovenox at this time  Status is: Inpatient Remains inpatient appropriate because:   Requires further management   Subjective:  Happy lady, no distress no fever ROM improved to R side overall-Memory improved, not confused  but some Anomia  Objective + exam Vitals:   04/25/23 1526 04/25/23 2035 04/26/23 0030 04/26/23 0744  BP: 134/78 124/65 110/64 139/71  Pulse: (!) 109 89 76 83  Resp: 18 18 18 18   Temp: 98 F (36.7 C) 98.2 F (36.8 C) 98.1 F (36.7 C) 98.2 F (36.8 C)  TempSrc: Oral Oral Axillary Oral  SpO2: 97% 95% 98% 98%   There were no vitals filed for this visit.  Examination:  Some mild twisting of mouth, tongue protrudes relatively midline Shoulder shrug deficient on right Grade 3-4 power right upper extremity reflexes are slightly brisk in brachioradialis and brachialis Can lift leg on right side about 30 degrees off bed Plantars are downgoing bilaterally  Data Reviewed: reviewed   CBC    Component Value Date/Time   WBC 8.8 04/26/2023 0449   RBC 3.78 (L) 04/26/2023 0449   HGB 13.2 04/26/2023 0449   HGB 13.7 03/31/2023 1113   HGB 14.2 03/15/2018 1123   HCT 37.3 04/26/2023 0449   HCT 44.0 03/15/2018 1123   PLT 151 04/26/2023 0449   PLT 168 03/31/2023 1113   PLT 346 03/15/2018 1123   MCV 98.7 04/26/2023 0449   MCV 94 03/15/2018 1123   MCH 34.9 (H) 04/26/2023 0449   MCHC 35.4 04/26/2023 0449   RDW 12.1 04/26/2023 0449   RDW 13.5 03/15/2018 1123   LYMPHSABS 1.1 04/26/2023 0449   LYMPHSABS 1.7 03/15/2018 1123   MONOABS 0.7 04/26/2023 0449   EOSABS 0.0 04/26/2023 0449   EOSABS 0.2 03/15/2018 1123   BASOSABS 0.0 04/26/2023 0449   BASOSABS 0.0 03/15/2018 1123      Latest Ref Rng & Units 04/26/2023    4:49 AM 04/25/2023  5:07 AM 04/23/2023    1:23 PM  CMP  Glucose 70 - 99 mg/dL 469  629  528   BUN 8 - 23 mg/dL 23  24  27    Creatinine 0.44 - 1.00 mg/dL 4.13  2.44  0.10   Sodium 135 - 145 mmol/L 138  139  140   Potassium 3.5 - 5.1 mmol/L 3.3  3.7  4.0   Chloride 98 - 111 mmol/L 105  113  112   CO2 22 - 32 mmol/L 23  19  20    Calcium 8.9 - 10.3 mg/dL 8.7  8.8  9.0   Total Protein 6.5 - 8.1 g/dL 5.3   6.6   Total Bilirubin 0.3 - 1.2 mg/dL 0.6   0.5   Alkaline Phos 38 - 126  U/L 49   62   AST 15 - 41 U/L 17   19   ALT 0 - 44 U/L 24   21     Scheduled Meds:  amLODipine  5 mg Oral Daily   atorvastatin  10 mg Oral Daily   busPIRone  5 mg Oral TID   dexamethasone (DECADRON) injection  4 mg Intravenous Q12H   enoxaparin (LOVENOX) injection  40 mg Subcutaneous Q24H   fluticasone  2 spray Each Nare Daily   lacosamide  50 mg Oral BID   losartan  50 mg Oral Daily   potassium chloride  40 mEq Oral Daily   [START ON 04/29/2023] temozolomide  255 mg Oral Daily   topiramate  50 mg Oral BID   Continuous Infusions:  sodium bicarbonate 150 mEq in sterile water 1,150 mL infusion 100 mL/hr at 04/26/23 0908    Time 15  Rhetta Mura, MD  [facility]

## 2023-04-27 DIAGNOSIS — C719 Malignant neoplasm of brain, unspecified: Secondary | ICD-10-CM | POA: Diagnosis not present

## 2023-04-27 DIAGNOSIS — R531 Weakness: Secondary | ICD-10-CM | POA: Diagnosis not present

## 2023-04-27 NOTE — Progress Notes (Signed)
No new issues or problems.  Patient continues to regain some strength and function on her right side.  Minimal headache.  Patient with recurrent left frontal glioblastoma.  Dr. Franky Macho to evaluate scan and treatment options tomorrow and discussed with patient.  Continue efforts at therapy and mobilization.  Currently I do not think that she is functioning well enough for discharge home.

## 2023-04-27 NOTE — Progress Notes (Addendum)
NEUROLOGY CONSULTATION PROGRESS NOTE   Date of service: April 27, 2023 Patient Name: KLEE BREIDING MRN:  [id number] DOB:  1955/09/15  Brief HPI  MORRIS LEITAO is a 67 y.o. female with PMH significant for with PMH significant for HLD, HTN, migraine, GBM s/p resection who presents with R sided weakness that has progressively worsened over the last 10 days, much more worse over last 3 days. Her neurologic examination is notable for RUE plegic and RLE weakness.   Interval Hx   No issues reported overnight.  Reports improvement in mentation as well as right-sided strength  Vitals   Vitals:   04/26/23 1946 04/27/23 0004 04/27/23 0447 04/27/23 0854  BP: 109/72 107/60 120/60 123/69  Pulse: 94 82 77 93  Resp: 16 18 18 20   Temp: 98.2 F (36.8 C) 98.4 F (36.9 C) 98.2 F (36.8 C) 98.3 F (36.8 C)  TempSrc: Oral Oral Oral Oral  SpO2: 95% 96% 98% 96%     There is no height or weight on file to calculate BMI.  Physical Exam  General: Awake alert in no distress HEENT: Normocephalic atraumatic Lungs: Clear Cardiovascular: Regular rhythm Abdomen nondistended nontender Neurological exam She is awake alert oriented x 3 No dysarthria No aphasia Cranial nerves: Pupils are equal round react light, extraocular was intact, visual fields full, face appears grossly symmetric at rest and on smiling, auditory acuity intact, normal palate elevation.  Tongue midline. Motor examination with 4 -/5 strength in the right upper extremity.  4+/5 in the right lower extremity. Sensation intact without extinction Coordination examination with no gross dysmetria on the left.  Difficult to perform on the right due to the weakness   Labs   Basic Metabolic Panel:  Lab Results  Component Value Date   NA 138 04/26/2023   K 3.3 (L) 04/26/2023   CO2 23 04/26/2023   GLUCOSE 125 (H) 04/26/2023   BUN 23 04/26/2023   CREATININE 1.02 (H) 04/26/2023   CALCIUM 8.7 (L) 04/26/2023   GFRNONAA >60 04/26/2023    GFRAA 70 10/04/2018   HbA1c:  Lab Results  Component Value Date   HGBA1C 5.3 04/23/2023   LDL:  Lab Results  Component Value Date   LDLCALC 107 (H) 04/24/2023   Urine Drug Screen:     Component Value Date/Time   LABOPIA NONE DETECTED 04/23/2023 2155   COCAINSCRNUR NONE DETECTED 04/23/2023 2155   LABBENZ NONE DETECTED 04/23/2023 2155   AMPHETMU NONE DETECTED 04/23/2023 2155   THCU NONE DETECTED 04/23/2023 2155   LABBARB NONE DETECTED 04/23/2023 2155    Alcohol Level     Component Value Date/Time   ETH <10 04/23/2023 1620   No results found for: "PHENYTOIN", "ZONISAMIDE", "LAMOTRIGINE", "LEVETIRACETA" No results found for: "PHENYTOIN", "PHENOBARB", "VALPROATE", "CBMZ"  Imaging and Diagnostic studies   MRI Brain(Personally reviewed): Interval increase in the size of contrast-enhancing tumor in the left frontal lobe.  Also there is increase in the FLAIR signal abnormality which involves quite large portion of the left hemisphere.   LTM EEG(8/31-9/1): This study is suggestive of cortical dysfunction arising from left hemisphere likely secondary to underlying tumor. Additionally there is cortical dysfunction in left centro-temporal region consistent with prior craniotomy. No seizures or definite epileptiform discharges were seen throughout the recording.   Impression   TAMAIA BENKE is a 67 y.o. female with PMH significant for with PMH significant for HLD, HTN, migraine, GBM s/p resection who presents with R sided weakness that has progressively worsened over the last  10 days, much more worse over last 3 days. Her neurologic examination is notable for RUE and RLE weakness. This seems to have significantly improved over the last couple of days. MR brain with concern for interval increase in the contrast-enhancing lesion. LTM EEG negative   Improving exam is reassuring-likely related to better control of seizures as well as steroids.   Recommendations  - continue Vimpat 50  BID - discontinue LTM. - PT and OT - management of tumor progression and edema per Neuro onc and Neurosurgery-awaiting decision on surgical recommendations from surgery team-Dr. Franky Macho. -Follow-up in 4 to 6 weeks with Dr. Barbaraann Cao  Inpatient neurology will be available as needed. Please call with questions Plan was discussed with Dr. Mahala Menghini.  Plan was also discussed with the patient and son at bedside. -- Milon Dikes, MD Neurologist [facility] Pager: [phone]

## 2023-04-27 NOTE — Progress Notes (Signed)
Physical Therapy Treatment Patient Details Name: Jennifer [name] MRN: [id number] DOB: 1955/12/31 Today's Date: 04/27/2023   History of Present Illness Patient is a 67 y.o. female admitted 8/29 for right sided weakness. 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    PT Comments  Showing some improvement with functional strength, ambulating short distance with min assist, RW for support. Still having difficulty with RLE advancement and intermittent buckling. Min assist with bed mobility and transfers for sequencing and balance. Bowel incontinence. Reduced awareness, impulsive, anomia, labile, but very pleasant and eager to pariticpate with PT efforts. Son present and very supportive. Patient will continue to benefit from skilled physical therapy services to further improve independence with functional mobility. Patient will benefit from intensive inpatient follow up therapy, >3 hours/day.    If plan is discharge home, recommend the following: A lot of help with bathing/dressing/bathroom;Assist for transportation;Help with stairs or ramp for entrance;Supervision due to cognitive status;Assistance with cooking/housework;A lot of help with walking and/or transfers;Direct supervision/assist for medications management;Direct supervision/assist for financial management   Can travel by private vehicle        Equipment Recommendations   (to be determined at next level of care)    Recommendations for Other Services Rehab consult     Precautions / Restrictions Precautions Precautions: Fall Restrictions Weight Bearing Restrictions: No     Mobility  Bed Mobility Overal bed mobility: Needs Assistance Bed Mobility: Supine to Sit     Supine to sit: Min assist     General bed mobility comments:  Min assist to scoot towards EOB, using LUE well to pull through but difficulty with using RUE and RLE to sequence to EOB.    Transfers Overall transfer level: Needs assistance Equipment used: Rolling walker (2 wheels) Transfers: Sit to/from Stand Sit to Stand: Min assist           General transfer comment: Min assist for balance, good power to boost up today. Practiced several times from bed. Cues for hand placement, RW for support. Mild instability upon rising with RLE unsteady.    Ambulation/Gait Ambulation/Gait assistance: Min assist Gait Distance (Feet): 25 Feet Assistive device: Rolling walker (2 wheels) Gait Pattern/deviations: Step-to pattern, Decreased step length - right, Decreased stance time - right, Decreased stride length, Decreased dorsiflexion - right, Knees buckling Gait velocity: decr Gait velocity interpretation: <1.31 ft/sec, indicative of household ambulator   General Gait Details: Min assist for balance and RLE advancement with intermittent assist controlling RW. Fair grip of RUE on walker but cues to correct at times. Difficulty with advancement of RLE into RW, drags/neglects without frequent cues.   Stairs             Wheelchair Mobility     Tilt Bed    Modified Rankin (Stroke Patients Only)       Balance Overall balance assessment: Needs assistance Sitting-balance support: Feet supported, No upper extremity supported Sitting balance-Leahy Scale: Fair Sitting balance - Comments: Progressed to CGA at EOB   Standing balance support: Single extremity supported Standing balance-Leahy Scale: Poor Standing balance comment: external support required.                            Cognition Arousal: Alert Behavior During Therapy: Lability, Impulsive Overall Cognitive Status: Impaired/Different from baseline Area of Impairment: Following  commands, Safety/judgement, Problem solving                       Following Commands:  Follows one step commands consistently, Follows multi-step commands inconsistently Safety/Judgement: Decreased awareness of safety, Decreased awareness of deficits   Problem Solving: Slow processing, Requires verbal cues, Requires tactile cues General Comments: Anomia; follows simple commands but quickly forgets; reduced awareness; impulsive        Exercises General Exercises - Lower Extremity Ankle Circles/Pumps: AROM, Both, 10 reps, Seated Quad Sets: AROM, Strengthening, Both, 10 reps, Seated Long Arc Quad: Strengthening, Both, 10 reps, Seated    General Comments General comments (skin integrity, edema, etc.): Bowel incontinence in bed. Assisted with peri-care in standing. RLE edematous      Pertinent Vitals/Pain Pain Assessment Pain Assessment: No/denies pain    Home Living                          Prior Function            PT Goals (current goals can now be found in the care plan section) Acute Rehab PT Goals Patient Stated Goal: to feel better PT Goal Formulation: With patient Time For Goal Achievement: 05/08/23 Potential to Achieve Goals: Good Progress towards PT goals: Progressing toward goals    Frequency    Min 1X/week      PT Plan      Co-evaluation              AM-PAC PT "6 Clicks" Mobility   Outcome Measure  Help needed turning from your back to your side while in a flat bed without using bedrails?: A Little Help needed moving from lying on your back to sitting on the side of a flat bed without using bedrails?: A Little Help needed moving to and from a bed to a chair (including a wheelchair)?: A Lot Help needed standing up from a chair using your arms (e.g., wheelchair or bedside chair)?: A Lot Help needed to walk in hospital room?: A Lot Help needed climbing 3-5 steps with a railing? : Total 6 Click Score: 13    End of Session Equipment Utilized During Treatment: Gait belt Activity Tolerance: Patient tolerated treatment  well Patient left: with call bell/phone within reach;in chair;with chair alarm set;with family/visitor present Nurse Communication: Mobility status;Other (comment) (Pure-wick soiled and removed. RLE edematous) PT Visit Diagnosis: Difficulty in walking, not elsewhere classified (R26.2);Other abnormalities of gait and mobility (R26.89)     Time: 1001-1029 PT Time Calculation (min) (ACUTE ONLY): 28 min  Charges:    $Gait Training: 8-22 mins $Therapeutic Activity: 8-22 mins PT General Charges $$ ACUTE PT VISIT: 1 Visit                     Kathlyn Sacramento, PT, DPT [facility]  Rehabilitation Services Physical Therapist Office: [phone] Website: [email or web address]    Berton Mount 04/27/2023, 12:07 PM

## 2023-04-27 NOTE — Progress Notes (Signed)
Inpatient Rehab Admissions Coordinator:    CIR following. Pt. To be seen by Dr. Franky Macho tomorrow to discuss possible surgical needs. I will follow up after that. Pt. Does not feel that she could manage at home at current levels, so she will need rehab even if she does not require surgery.   Megan Salon, MS, CCC-SLP Rehab Admissions Coordinator  [phone] (celll) [phone] (office)

## 2023-04-27 NOTE — Progress Notes (Signed)
HOSPITALIST ROUNDING NOTE Jennifer [name] WUJ:[id number]  DOB: [birth year]  DOA: 04/23/2023  PCP: Irving Copas H, PA-C  04/27/2023,1:29 PM   LOS: 4 days      Code Status: Full   From: Home  current Dispo: Unclear     67 year old home dwelling female HTN HLD migraines GBM status postresection 04/16/2022 Sees neuro oncologist Dr. Barbaraann Cao is on concurrent Temodar with maintenance Temodar as well Was seen in clinic by Dr. Barbaraann Cao 03/31/2023 last-had completed 6 cycles of Temodar but had progression of disease apparently on MR Presented to [facility] 8/29 with full right-sided deficit CT showed vasogenic edema MRI showed increasing size of tumor--- telemetry-neurology evaluated the patient and recommended transfer to [facility] Presented with right-sided weakness mental status changes 8/30 neurology and neuro-oncology consulted--neurosurgery consulted 8/31 neurosurgery seen patient awaiting formal planning BUN/creatinine 27/1 Patient given Decadron neurology consulted sent to progressive bed  Plan  R-sided weakness in the setting of cerebral edema from GBM Neurosurgery/Neuro-oncology have seen the patient patient is on Vimpat per Neurology 50 twice daily, continuing Topamax 50 twice daily [ no more keppra] continues on Decadron 4 IV every 12 Continues on Temodar as per neuro-oncology Discussed over several days with various neurosurgeons-Dr. Franky Macho aware of patient-?  Surgery +/- no surgery as patient's functional deficits have improved?? Therapy has seen the patient and feels she would benefit from CIR at discharge and we will attempt to place her there  AKI on admission Mild met acidosis Improved Mild hypokalemia-give potassium 40 daily and recheck labs  Migraine Continue sumatriptan 50 twice daily as needed migraine as well as Topamax  HTN continue losartan 50 daily, amlodipine 5 daily  Discussed in person with the son at the bedside and explained course of events and continue to await  final disposition planning as per neurosurgeon  DVT prophylaxis: Lovenox at this time  Status is: Inpatient Remains inpatient appropriate because:   Stabilizing requires CIR input and placement likely eventually   Subjective:  Most of her deficits have improved She is sitting in chair today quite happy She is asking "when can I go home"  Objective + exam Vitals:   04/27/23 0004 04/27/23 0447 04/27/23 0854 04/27/23 1142  BP: 107/60 120/60 123/69 129/82  Pulse: 82 77 93 (!) 104  Resp: 18 18 20 17   Temp: 98.4 F (36.9 C) 98.2 F (36.8 C) 98.3 F (36.8 C) 98.1 F (36.7 C)  TempSrc: Oral Oral Oral Oral  SpO2: 96% 98% 96% 98%   There were no vitals filed for this visit.  Examination:  Some mild twisting of mouth, tongue protrudes relatively midline 3/4 power right side Chest is clear S1-S2 no murmur Did not assess gait normal reflexes today  Data Reviewed: reviewed   CBC    Component Value Date/Time   WBC 8.8 04/26/2023 0449   RBC 3.78 (L) 04/26/2023 0449   HGB 13.2 04/26/2023 0449   HGB 13.7 03/31/2023 1113   HGB 14.2 03/15/2018 1123   HCT 37.3 04/26/2023 0449   HCT 44.0 03/15/2018 1123   PLT 151 04/26/2023 0449   PLT 168 03/31/2023 1113   PLT 346 03/15/2018 1123   MCV 98.7 04/26/2023 0449   MCV 94 03/15/2018 1123   MCH 34.9 (H) 04/26/2023 0449   MCHC 35.4 04/26/2023 0449   RDW 12.1 04/26/2023 0449   RDW 13.5 03/15/2018 1123   LYMPHSABS 1.1 04/26/2023 0449   LYMPHSABS 1.7 03/15/2018 1123   MONOABS 0.7 04/26/2023 0449   EOSABS 0.0  04/26/2023 0449   EOSABS 0.2 03/15/2018 1123   BASOSABS 0.0 04/26/2023 0449   BASOSABS 0.0 03/15/2018 1123      Latest Ref Rng & Units 04/26/2023    4:49 AM 04/25/2023    5:07 AM 04/23/2023    1:23 PM  CMP  Glucose 70 - 99 mg/dL 161  096  045   BUN 8 - 23 mg/dL 23  24  27    Creatinine 0.44 - 1.00 mg/dL 4.09  8.11  9.14   Sodium 135 - 145 mmol/L 138  139  140   Potassium 3.5 - 5.1 mmol/L 3.3  3.7  4.0   Chloride 98 - 111  mmol/L 105  113  112   CO2 22 - 32 mmol/L 23  19  20    Calcium 8.9 - 10.3 mg/dL 8.7  8.8  9.0   Total Protein 6.5 - 8.1 g/dL 5.3   6.6   Total Bilirubin 0.3 - 1.2 mg/dL 0.6   0.5   Alkaline Phos 38 - 126 U/L 49   62   AST 15 - 41 U/L 17   19   ALT 0 - 44 U/L 24   21     Scheduled Meds:  amLODipine  5 mg Oral Daily   atorvastatin  10 mg Oral Daily   busPIRone  5 mg Oral TID   dexamethasone  2 mg Oral Q6H   enoxaparin (LOVENOX) injection  40 mg Subcutaneous Q24H   fluticasone  2 spray Each Nare Daily   lacosamide  50 mg Oral BID   losartan  50 mg Oral Daily   potassium chloride  40 mEq Oral Daily   [START ON 04/29/2023] temozolomide  255 mg Oral Daily   topiramate  50 mg Oral BID   Continuous Infusions:    Time 25  Rhetta Mura, MD  [facility]

## 2023-04-28 ENCOUNTER — Inpatient Hospital Stay: Payer: Medicare Other

## 2023-04-28 ENCOUNTER — Inpatient Hospital Stay: Payer: Medicare Other | Admitting: Internal Medicine

## 2023-04-28 ENCOUNTER — Other Ambulatory Visit (HOSPITAL_COMMUNITY): Payer: Self-pay

## 2023-04-28 DIAGNOSIS — R531 Weakness: Secondary | ICD-10-CM | POA: Diagnosis not present

## 2023-04-28 LAB — BASIC METABOLIC PANEL
Anion gap: 12 (ref 5–15)
BUN: 27 mg/dL — ABNORMAL HIGH (ref 8–23)
CO2: 21 mmol/L — ABNORMAL LOW (ref 22–32)
Calcium: 9.4 mg/dL (ref 8.9–10.3)
Chloride: 104 mmol/L (ref 98–111)
Creatinine, Ser: 0.98 mg/dL (ref 0.44–1.00)
GFR, Estimated: >60 mL/min (ref >60)
Glucose, Bld: 143 mg/dL — ABNORMAL HIGH (ref 70–99)
Potassium: 3.7 mmol/L (ref 3.5–5.1)
Sodium: 137 mmol/L (ref 135–145)

## 2023-04-28 MED ORDER — DEXAMETHASONE 2 MG PO TABS: 2.0000 mg | ORAL_TABLET | Freq: Four times a day (QID) | ORAL | 0 refills | Status: DC

## 2023-04-28 MED ORDER — LACOSAMIDE 50 MG PO TABS: 50.0000 mg | ORAL_TABLET | Freq: Two times a day (BID) | ORAL | 0 refills | Status: DC

## 2023-04-28 NOTE — Progress Notes (Signed)
HOSPITALIST ROUNDING NOTE SHANNE KAVANAGH RUE:[id number]  DOB: 1956-07-13  DOA: 04/23/2023  PCP: Irving Copas H, PA-C  04/28/2023,9:24 AM   LOS: 5 days      Code Status: Full   From: Home  current Dispo: Unclear     67 year old home dwelling female HTN HLD migraines GBM status postresection 04/16/2022 Sees neuro oncologist Dr. Barbaraann Cao is on concurrent Temodar with maintenance Temodar as well Was seen in clinic by Dr. Barbaraann Cao 03/31/2023 last-had completed 6 cycles of Temodar but had progression of disease apparently on MR Presented to [facility] 8/29 with full right-sided deficit CT showed vasogenic edema MRI showed increasing size of tumor--- telemetry-neurology evaluated the patient and recommended transfer to [facility] Presented with right-sided weakness mental status changes 8/30 neurology and neuro-oncology consulted--neurosurgery consulted 8/31 neurosurgery seen patient awaiting formal planning BUN/creatinine 27/1 Patient given Decadron neurology consulted sent to progressive bed  Plan  R-sided weakness in the setting of cerebral edema from GBM Await Dr. Franky Macho input --surgery +/- no surgery as patient's functional deficits have improved?? Vimpat 50 twice daily Topamax 50 twice daily Decadron 2 mg every 6 until Dr. Franky Macho determines otherwise Therapy has seen the patient and feels she would benefit from CIR at discharge and we will attempt to place her there  AKI on admission Mild met acidosis Improved Resolved-no further lab checks  Migraine Continue sumatriptan 50 twice daily as needed migraine as well as Topamax  HTN continue losartan 50 daily, amlodipine 5 daily  Final disposition planning per neurosurgeon  DVT prophylaxis: Lovenox at this time  Status is: Inpatient Remains inpatient appropriate because:   Stabilizing requires CIR input and placement likely eventually   Subjective:  Moving upper and lower extremities on the right side well now-deficits have  improved son at bedside  Objective + exam Vitals:   04/27/23 2100 04/28/23 0003 04/28/23 0500 04/28/23 0831  BP: 117/76 108/71 110/69 125/79  Pulse: 81 77 66 97  Resp: 16 16 18 20   Temp: 98.5 F (36.9 C) 98.1 F (36.7 C) 98 F (36.7 C)   TempSrc: Oral Oral Oral   SpO2: 97% 98% 99% 97%   There were no vitals filed for this visit.  Examination:  5/5 power no facial twisting Chest clear S1-S2 no murmur  Data Reviewed: reviewed   CBC    Component Value Date/Time   WBC 8.8 04/26/2023 0449   RBC 3.78 (L) 04/26/2023 0449   HGB 13.2 04/26/2023 0449   HGB 13.7 03/31/2023 1113   HGB 14.2 03/15/2018 1123   HCT 37.3 04/26/2023 0449   HCT 44.0 03/15/2018 1123   PLT 151 04/26/2023 0449   PLT 168 03/31/2023 1113   PLT 346 03/15/2018 1123   MCV 98.7 04/26/2023 0449   MCV 94 03/15/2018 1123   MCH 34.9 (H) 04/26/2023 0449   MCHC 35.4 04/26/2023 0449   RDW 12.1 04/26/2023 0449   RDW 13.5 03/15/2018 1123   LYMPHSABS 1.1 04/26/2023 0449   LYMPHSABS 1.7 03/15/2018 1123   MONOABS 0.7 04/26/2023 0449   EOSABS 0.0 04/26/2023 0449   EOSABS 0.2 03/15/2018 1123   BASOSABS 0.0 04/26/2023 0449   BASOSABS 0.0 03/15/2018 1123      Latest Ref Rng & Units 04/28/2023    7:40 AM 04/26/2023    4:49 AM 04/25/2023    5:07 AM  CMP  Glucose 70 - 99 mg/dL 147  829  562   BUN 8 - 23 mg/dL 27  23  24  Creatinine 0.44 - 1.00 mg/dL 0.86  5.78  4.69   Sodium 135 - 145 mmol/L 137  138  139   Potassium 3.5 - 5.1 mmol/L 3.7  3.3  3.7   Chloride 98 - 111 mmol/L 104  105  113   CO2 22 - 32 mmol/L 21  23  19    Calcium 8.9 - 10.3 mg/dL 9.4  8.7  8.8   Total Protein 6.5 - 8.1 g/dL  5.3    Total Bilirubin 0.3 - 1.2 mg/dL  0.6    Alkaline Phos 38 - 126 U/L  49    AST 15 - 41 U/L  17    ALT 0 - 44 U/L  24      Scheduled Meds:  amLODipine  5 mg Oral Daily   atorvastatin  10 mg Oral Daily   busPIRone  5 mg Oral TID   dexamethasone  2 mg Oral Q6H   enoxaparin (LOVENOX) injection  40 mg Subcutaneous Q24H    fluticasone  2 spray Each Nare Daily   lacosamide  50 mg Oral BID   losartan  50 mg Oral Daily   potassium chloride  40 mEq Oral Daily   [START ON 04/29/2023] temozolomide  255 mg Oral Daily   topiramate  50 mg Oral BID   Continuous Infusions:    Time 25  Rhetta Mura, MD  [facility]

## 2023-04-28 NOTE — Progress Notes (Signed)
Patient ID: Jennifer [name], female   DOB: [birth year], 67 y.o.   MRN: [id number] BP 115/71 (BP Location: Left Arm)   Pulse 85   Temp 98.1 F (36.7 C) (Oral)   Resp 18   SpO2 99%  Patient seen and examined. Will arrange for OR and redo craniotomy Believe we can use gliadel for this. Remains weak on right side.  By no means emergent as she is awake, oriented x 4, plegic on right side.  Headed to rehab, and my office will make arrangements.

## 2023-04-28 NOTE — H&P (View-Only) (Signed)
Patient ID: Jennifer [name], female   DOB: [birth year], 67 y.o.   MRN: [id number] BP 115/71 (BP Location: Left Arm)   Pulse 85   Temp 98.1 F (36.7 C) (Oral)   Resp 18   SpO2 99%  Patient seen and examined. Will arrange for OR and redo craniotomy Believe we can use gliadel for this. Remains weak on right side.  By no means emergent as she is awake, oriented x 4, plegic on right side.  Headed to rehab, and my office will make arrangements.

## 2023-04-28 NOTE — Progress Notes (Signed)
Speech Language Pathology Treatment: Cognitive-Linquistic  Patient Details Name: PREYA STONEBURG MRN: [id number] DOB: April 23, 1956 Today's Date: 04/28/2023 Time: 8841-6606 SLP Time Calculation (min) (ACUTE ONLY): 17 min  Assessment / Plan / Recommendation Clinical Impression  Pt seen for cognitive-communicative intervention with son present. Pt described what she did this morning to therapist in full sentences. She had several semantical errors in which she was able to correct given min prompts.  She read directions and answered questions with min cues needed to see information on the right side of the page. Pt was cued to use her left hand to scan over to the right and encouraged to use this as a strategy when reading. Pt able to locate information on menu with accurate calculations. She had more difficulty identifying errors on a pill box schematic and needed mild-moderate cueing to complete. Recommend continued therapy and would benefit from inpatient rehab.   HPI HPI: RAEAH STOUDER is a 67 y.o. 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  She was seen for new onset seizures on 8/18 and was sent home Keppra and follow off with oncology.  Her oncologist ordered brain MRI with and without contrast which has not been completed. She has been having dysarthria and expressive dysphasia. MRI Interval increase in size of contrast-enhancing tumor centered in the left frontal lobe, as well as the surrounding T2/FLAIR hyperintense signal abnormality which now extends posteriorly to the level of the postcentral gyrus.      SLP Plan  Continue with current plan of care      Recommendations for follow up therapy are one component of a multi-disciplinary discharge planning process, led by the attending  physician.  Recommendations may be updated based on patient status, additional functional criteria and insurance authorization.    Recommendations                   Rehab consult Oral care BID   Frequent or constant Supervision/Assistance Cognitive communication deficit (T01.601)     Continue with current plan of care     [name]  04/28/2023, 9:49 AM

## 2023-04-28 NOTE — TOC Initial Note (Signed)
Transition of Care [facility]) - Initial/Assessment Note    Patient Details  Name: Jennifer [name] MRN: [id number] Date of Birth: 12/20/1955  Transition of [facility]) CM/SW Contact:    Kermit Balo, RN Phone Number: 04/28/2023, 4:06 PM  Clinical Narrative:                 Plan is for home with family until she has her surgery and then see if she qualifies for CIR. CM has updated her daughter in law.  Home health arranged with [facility]. Information on the AVS.  Walker/ BSC ordered through [employer] and will be delivered to the room. Family will transport home when medically ready.   Expected Discharge Plan: Home w Home Health Services Barriers to Discharge: Continued Medical Work up   Patient Goals and CMS Choice   CMS Medicare.gov Compare Post Acute Care list provided to:: Patient Represenative (must comment) Choice offered to / list presented to : Adult Children      Expected Discharge Plan and Services   Discharge Planning Services: CM Consult Post Acute Care Choice: Durable Medical Equipment, Home Health Living arrangements for the past 2 months: Single Family Home                 DME Arranged: Bedside commode, Walker rolling DME Agency: [employer] Date DME Agency Contacted: 04/28/23   Representative spoke with at DME Agency: Zack HH Arranged: PT, OT, Nurse's Aide          Prior Living Arrangements/Services Living arrangements for the past 2 months: Single Family Home Lives with:: Adult Children Patient language and need for interpreter reviewed:: Yes Do you feel safe going back to the place where you live?: Yes        Care giver support system in place?: Yes (comment)   Criminal Activity/Legal Involvement Pertinent to Current Situation/Hospitalization: No - Comment as needed  Activities of Daily Living      Permission Sought/Granted                  Emotional Assessment Appearance:: Appears stated age         Psych Involvement: No  (comment)  Admission diagnosis:  Glioblastoma multiforme (HCC) [C71.9] Weakness of right upper extremity [R29.898] Acute right hemiparesis (HCC) [G81.91] Weakness of right lower extremity [R29.898] Patient Active Problem List   Diagnosis Date Noted   Acute right hemiparesis (HCC) 04/23/2023   Left-sided weakness 04/23/2023   Seizure disorder (HCC) 04/23/2023   Migraine 04/23/2023   Focal seizures (HCC) 03/31/2023   Glioblastoma multiforme of frontal lobe (HCC) 04/09/2022   Essential hypertension 06/13/2015   Bilateral chronic knee pain 10/14/2013   Chronic bilateral low back pain without sciatica 11/01/2012   S/P laparoscopic appendectomy 07/28/2012   Migraines 09/24/2011   Hyperlipidemia 09/24/2011   PCP:  Judd Lien, PA-C Pharmacy:   [facility] [address], [place] - [address] [place] [address] Phone: [phone] Fax: [phone]     Social Determinants of Health (SDOH) Social History: SDOH Screenings   Food Insecurity: No Food Insecurity (02/26/2022)   Received from [facility]  Transportation Needs: No Transportation Needs (02/26/2022)   Received from [facility]  Utilities: Not At Risk (02/26/2022)   Received from [facility] visits prior to 10/25/2022., [facility] visits prior to 10/25/2022.  Depression (PHQ2-9): Low Risk  (03/10/2019)  Financial Resource Strain: Low Risk  (02/26/2022)   [facility]  Physical Activity: Insufficiently Active (02/26/2022)   Received from [facility]  Social Connections: Socially Isolated (02/26/2022)   Received from [facility]  Stress: Stress Concern Present (02/26/2022)   Received from [facility]  Tobacco Use: Low Risk  (04/23/2023)   SDOH Interventions:     Readmission Risk Interventions     No data to display

## 2023-04-28 NOTE — Plan of Care (Signed)
  Problem: Health Behavior/Discharge Planning: Goal: Ability to manage health-related needs will improve Outcome: Progressing   

## 2023-04-28 NOTE — Progress Notes (Signed)
Physical Therapy Treatment Patient Details Name: Jennifer [name] MRN: [id number] DOB: 11/08/1955 Today's Date: 04/28/2023   History of Present Illness Patient is a 67 y.o. female admitted 8/29 for right sided weakness. 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    PT Comments  Patient is agreeable to PT session. She is seated in the chair and eager to demonstrate active movement in RUE and requesting to walk. The patient continues to require cues for improved gait kinematics, including increasing step length of RLE. Increased assistance required with dual tasking during ambulation with decreased coordination and loss of balance.  She also required increased assistance with backwards steps with loss of balance posteriorly. Educated patient on importance for rest breaks with activity.  Daughter in law at the bedside confirms family desire for patient to go to rehab at discharge. Recommend to continue PT to maximize independence and decrease caregiver burden. Anticipate patient could benefit from intensity of >3 hours of therapy daily after this hospital stay.    If plan is discharge home, recommend the following: A lot of help with bathing/dressing/bathroom;Assist for transportation;Help with stairs or ramp for entrance;Supervision due to cognitive status;Assistance with cooking/housework;A lot of help with walking and/or transfers;Direct supervision/assist for medications management;Direct supervision/assist for financial management   Can travel by private vehicle        Equipment Recommendations   (to be determined at next level of care)    Recommendations for Other Services Rehab consult     Precautions / Restrictions Precautions Precautions: Fall Restrictions Weight Bearing Restrictions:  No     Mobility  Bed Mobility               General bed mobility comments: not assessed as patient sitting up on arrival and post session    Transfers Overall transfer level: Needs assistance Equipment used: Rolling walker (2 wheels) Transfers: Sit to/from Stand Sit to Stand: Min assist           General transfer comment: verbal cues for left hand placement, cues for safety.    Ambulation/Gait Ambulation/Gait assistance: Min assist, Mod assist Gait Distance (Feet): 75 Feet Assistive device: Rolling walker (2 wheels) Gait Pattern/deviations: Step-to pattern, Decreased step length - right, Decreased stance time - right, Decreased stride length, Decreased dorsiflexion - right, Knees buckling Gait velocity: decreased     General Gait Details: steadying assistance provided with moderate cues for increasing step length of RLE. mild loss of balance with dual tasking of looking to find her room number and direction change. patient required increased assistance with backwards steps and has loss of balance posteriorly.   Stairs             Wheelchair Mobility     Tilt Bed    Modified Rankin (Stroke Patients Only)       Balance Overall balance assessment: Needs assistance Sitting-balance support: Feet supported, No upper extremity supported Sitting balance-Leahy Scale: Good     Standing balance support: Single extremity supported Standing balance-Leahy Scale: Poor Standing balance comment: external support required                            Cognition Arousal: Alert Behavior During Therapy: Lability, Impulsive Overall Cognitive Status: Impaired/Different from baseline Area of Impairment: Safety/judgement, Problem solving  Following Commands: Follows one step commands consistently, Follows multi-step commands inconsistently Safety/Judgement: Decreased awareness of safety, Decreased awareness of deficits    Problem Solving: Slow processing, Requires verbal cues, Requires tactile cues General Comments: patient following simple commands consistently.        Exercises General Exercises - Lower Extremity Ankle Circles/Pumps: AROM, Strengthening, Right, 10 reps, Seated Long Arc Quad: AROM, Strengthening, Right, 5 reps, Seated (lightly graded resistance) Hip Flexion/Marching: AROM, Strengthening, 5 reps, Seated    General Comments General comments (skin integrity, edema, etc.): significant improvement in RUE strength compared to evaluation.      Pertinent Vitals/Pain Pain Assessment Pain Assessment: No/denies pain    Home Living                          Prior Function            PT Goals (current goals can now be found in the care plan section) Acute Rehab PT Goals Patient Stated Goal: to go home PT Goal Formulation: With patient Time For Goal Achievement: 05/08/23 Potential to Achieve Goals: Good Progress towards PT goals: Progressing toward goals    Frequency    Min 1X/week      PT Plan      Co-evaluation              AM-PAC PT "6 Clicks" Mobility   Outcome Measure  Help needed turning from your back to your side while in a flat bed without using bedrails?: A Little Help needed moving from lying on your back to sitting on the side of a flat bed without using bedrails?: A Little Help needed moving to and from a bed to a chair (including a wheelchair)?: A Lot Help needed standing up from a chair using your arms (e.g., wheelchair or bedside chair)?: A Lot Help needed to walk in hospital room?: A Lot Help needed climbing 3-5 steps with a railing? : Total 6 Click Score: 13    End of Session Equipment Utilized During Treatment: Gait belt Activity Tolerance: Patient tolerated treatment well Patient left: in chair;with call bell/phone within reach;with chair alarm set;with family/visitor present   PT Visit Diagnosis: Difficulty in walking, not  elsewhere classified (R26.2);Other abnormalities of gait and mobility (R26.89)     Time: 6213-0865 PT Time Calculation (min) (ACUTE ONLY): 22 min  Charges:    $Gait Training: 8-22 mins PT General Charges $$ ACUTE PT VISIT: 1 Visit                     Donna Bernard, PT, MPT    Ina Homes 04/28/2023, 2:03 PM

## 2023-04-28 NOTE — Progress Notes (Signed)
Occupational Therapy Treatment Patient Details Name: Jennifer [name] MRN: [id number] DOB: 05-Oct-1955 Today's Date: 04/28/2023   History of present illness Patient is a 67 y.o. female admitted 8/29 for right sided weakness. 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   OT comments  Pt making excellent progress towards OT goals and eager to participate. Pt with noted improvements of R UE function though deficits in shoulder/triceps strength, inattention and proprioception remain. Pt able to mobilize to/from sink with RW and Min A (cues for RLE proprioception). Pt requires Min A for ADLs standing at sink today with good involvement of RUE to assist with tasks. Continue to feel pt would progress quickly with intensive rehab services at [place].      If plan is discharge home, recommend the following:  A lot of help with bathing/dressing/bathroom;Assistance with cooking/housework;Direct supervision/assist for medications management;Direct supervision/assist for financial management;Assist for transportation;Help with stairs or ramp for entrance;A little help with walking and/or transfers   Equipment Recommendations  Other (comment) (TBD pending progress)    Recommendations for Other Services Rehab consult    Precautions / Restrictions Precautions Precautions: Fall Restrictions Weight Bearing Restrictions: No       Mobility Bed Mobility Overal bed mobility: Needs Assistance Bed Mobility: Supine to Sit     Supine to sit: Supervision, HOB elevated          Transfers Overall transfer level: Needs assistance Equipment used: Rolling walker (2 wheels) Transfers: Sit to/from Stand Sit to Stand: Min assist                 Balance Overall balance assessment: Needs  assistance Sitting-balance support: Feet supported, No upper extremity supported Sitting balance-Leahy Scale: Good     Standing balance support: Single extremity supported Standing balance-Leahy Scale: Fair                             ADL either performed or assessed with clinical judgement   ADL Overall ADL's : Needs assistance/impaired     Grooming: Contact guard assist;Minimal assistance;Standing;Oral care;Wash/dry face;Wash/dry hands;Brushing hair Grooming Details (indicate cue type and reason): CGA for balance. Min A to reach to pull paper towel from dispenser with RUE. used L hand to brush teeth but using R hand to assist opening containers and manipulating washcloth. cues/assist to reposition R hand on sink to faciliate WB             Lower Body Dressing: Sitting/lateral leans;Sit to/from stand;Moderate assistance;Minimal assistance Lower Body Dressing Details (indicate cue type and reason): able to bring LE to self to pull up socks. will need increased assist in standing             Functional mobility during ADLs: Minimal assistance;Rolling walker (2 wheels);Cueing for sequencing;Cueing for safety      Extremity/Trunk Assessment Upper Extremity Assessment Upper Extremity Assessment: Right hand dominant;RUE deficits/detail RUE Deficits / Details: much improved R UE function. able to squeeze fist, move wrist and elbow WFL. shoulder weakness noted, actively to 80*. triceps strength worse than biceps at 3/5. decreased awareness of UE positioning during ADLs at sink with wrist turned excessively, etc with cues to correct RUE Sensation: decreased proprioception RUE Coordination: decreased fine motor;decreased gross motor   Lower Extremity Assessment Lower Extremity Assessment: Defer to PT evaluation  Vision   Vision Assessment?: No apparent visual deficits   Perception Perception Perception: Impaired Preception Impairment Details:  Inattention/Neglect Perception-Other Comments: R inattention   Praxis      Cognition Arousal: Alert Behavior During Therapy: Lability, Impulsive Overall Cognitive Status: Impaired/Different from baseline Area of Impairment: Safety/judgement, Problem solving                         Safety/Judgement: Decreased awareness of safety, Decreased awareness of deficits   Problem Solving: Slow processing, Requires verbal cues, Requires tactile cues General Comments: very pleasant, follows commands consistently though does need safety cues for awareness of RLE during mobility and use of RUE/sequencing UE during tasks.        Exercises Exercises: Other exercises Other Exercises Other Exercises: scapular elevation, depression, protraction, retraction Other Exercises: self ROM R shoulder flexion (with Min A)    Shoulder Instructions       General Comments      Pertinent Vitals/ Pain       Pain Assessment Pain Assessment: No/denies pain  Home Living                                          Prior Functioning/Environment              Frequency  Min 1X/week        Progress Toward Goals  OT Goals(current goals can now be found in the care plan section)  Progress towards OT goals: Progressing toward goals  Acute Rehab OT Goals Patient Stated Goal: be able to go home, do whatever i can for myself OT Goal Formulation: With patient Time For Goal Achievement: 05/09/23 Potential to Achieve Goals: Good ADL Goals Pt Will Perform Grooming: standing;with min assist Pt Will Perform Upper Body Dressing: with min assist;sitting Pt Will Perform Lower Body Dressing: with mod assist;sit to/from stand Pt/caregiver will Perform Home Exercise Program: Right Upper extremity;Increased ROM;With written HEP provided Additional ADL Goal #1: Pt will follow one step commands with incresed time and min cues for safety during ADL or mobility.  Plan       Co-evaluation                 AM-PAC OT "6 Clicks" Daily Activity     Outcome Measure   Help from another person eating meals?: A Little Help from another person taking care of personal grooming?: A Little Help from another person toileting, which includes using toliet, bedpan, or urinal?: A Lot Help from another person bathing (including washing, rinsing, drying)?: A Lot Help from another person to put on and taking off regular upper body clothing?: A Little Help from another person to put on and taking off regular lower body clothing?: A Lot 6 Click Score: 15    End of Session Equipment Utilized During Treatment: Gait belt;Rolling walker (2 wheels)  OT Visit Diagnosis: Unsteadiness on feet (R26.81);Muscle weakness (generalized) (M62.81);Other symptoms and signs involving cognitive function;Cognitive communication deficit (R41.841);Hemiplegia and hemiparesis Hemiplegia - Right/Left: Right Hemiplegia - dominant/non-dominant: Dominant   Activity Tolerance Patient tolerated treatment well   Patient Left in chair;with call bell/phone within reach;with chair alarm set   Nurse Communication Mobility status        Time: 4098-1191 OT Time Calculation (min): 27 min  Charges: OT General Charges $OT Visit: 1 Visit OT Treatments $Self Care/Home Management : 23-37 mins  Bradd Canary, OTR/L Acute Rehab Services Office: [phone]   Lorre Munroe 04/28/2023, 8:57 AM

## 2023-04-28 NOTE — Discharge Summary (Signed)
Physician Discharge Summary  THIRZA TYSZKA UVO:[id number] DOB: [birth year] DOA: 04/23/2023  PCP: Judd Lien, PA-C  Admit date: 04/23/2023 Discharge date: 04/28/2023  Time spent: 36 minutes  Recommendations for Outpatient Follow-up:  Patient discharging home with home health Neurosurgeon Dr. Franky Macho will communicate surgical plan with patient and family Continues on new medication of Vimpat 50 twice daily, Decadron every 6 2 mg Needs outpatient Chem-12 CBC 1 week  Discharge Diagnoses:  MAIN problem for hospitalization   Vasogenic edema causing hemiplegic leg symptoms and altered mental status/metabolic encephalopathy on admission now resolving AKI on admission mild metabolic acidosis Migraines  Please see below for itemized issues addressed in HOpsital- refer to other progress notes for clarity if needed  Discharge Condition: Fair  Diet recommendation: Heart healthy  There were no vitals filed for this visit.  History of present illness:  67 year old home dwelling female HTN HLD migraines GBM status postresection 04/16/2022 Sees neuro oncologist Dr. Barbaraann Cao is on concurrent Temodar with maintenance Temodar as well Was seen in clinic by Dr. Barbaraann Cao 03/31/2023 last-had completed 6 cycles of Temodar but had progression of disease apparently on MR Presented to [facility] 8/29 with full right-sided deficit CT showed vasogenic edema MRI showed increasing size of tumor--- telemetry-neurology evaluated the patient and recommended transfer to [facility] Presented with right-sided weakness mental status changes 8/30 neurology and neuro-oncology consulted--neurosurgery consulted 8/31 neurosurgery seen patient awaiting formal planning BUN/creatinine 27/1 Patient given Decadron neurology consulted sent to progressive bed   Plan   R-sided weakness in the setting of cerebral edema from GBM Outpatient follow-up with Dr. Sueanne Margarita office regarding surgical timing Vimpat 50 twice daily  Topamax 50 twice daily Decadron 2 mg every 6 until Dr. Franky Macho determines otherwise Patient go home with home health return for surgery and likely need CIR subsequently   AKI on admission Mild met acidosis Improved  Migraine Continue sumatriptan 50 twice daily as needed migraine as well as Topamax   HTN continue losartan 50 daily, amlodipine 5 daily  : Vitals:   04/28/23 1200 04/28/23 1520  BP: 115/71 113/71  Pulse: 85 93  Resp: 18 18  Temp: 98.1 F (36.7 C) 98.6 F (37 C)  SpO2: 99% 98%     General Exam on discharge  Awake alert coherent smiling happy Mild hemiparesis right upper extremity right lower extremity was ambulating more-with therapy did require increased assistance with dual tasking was able to walk however 75 feet Abdomen is soft no rebound S1-S2 no murmur  Discharge Instructions   Discharge Instructions     Diet - low sodium heart healthy   Complete by: As directed    Discharge instructions   Complete by: As directed    Dr. Franky Macho as well as Dr. Barbaraann Cao are directing your care and we will call you about steroid dosing, recurrent surgery, other planning Please contact their offices if there are issues with pain weakness etc. We will order home health for you   Increase activity slowly   Complete by: As directed       Allergies as of 04/28/2023   No Known Allergies      Medication List     STOP taking these medications    levETIRAcetam 500 MG tablet Commonly known as: Keppra   meloxicam 7.5 MG tablet Commonly known as: MOBIC   Neo-Synephrine Cold/Allrgy Ext 1 % nasal spray Generic drug: phenylephrine   traMADol 50 MG tablet Commonly known as: ULTRAM       TAKE these  medications    amLODipine 5 MG tablet Commonly known as: NORVASC TAKE 1 TABLET BY MOUTH EVERY DAY   atorvastatin 10 MG tablet Commonly known as: LIPITOR TAKE 1 TABLET BY MOUTH EVERY DAY   busPIRone 5 MG tablet Commonly known as: BUSPAR TAKE 1 TABLET BY MOUTH  THREE TIMES A DAY   dexamethasone 2 MG tablet Commonly known as: DECADRON Take 1 tablet (2 mg total) by mouth every 6 (six) hours. What changed: when to take this   fluticasone 50 MCG/ACT nasal spray Commonly known as: FLONASE SPRAY 2 SPRAYS INTO EACH NOSTRIL EVERY DAY   lacosamide 50 MG Tabs tablet Commonly known as: VIMPAT Take 1 tablet (50 mg total) by mouth 2 (two) times daily.   losartan 50 MG tablet Commonly known as: COZAAR TAKE 1 TABLET BY MOUTH EVERY DAY   ondansetron 8 MG tablet Commonly known as: Zofran Take 1 tablet (8 mg total) by mouth every 8 (eight) hours as needed for nausea or vomiting. May take 30-60 minutes prior to Temodar administration if nausea/vomiting occurs as needed.   prochlorperazine 10 MG tablet Commonly known as: COMPAZINE Take 1 tablet (10 mg total) by mouth every 6 (six) hours as needed for nausea or vomiting.   SUMAtriptan 50 MG tablet Commonly known as: IMITREX Take 50 mg by mouth every 2 (two) hours as needed for migraine. May repeat in 2 hours if headache persists or recurs.   temozolomide 250 MG capsule Commonly known as: TEMODAR Take 1 capsule (250 mg total) by mouth daily. Take for 5 days on, 23 days off. Repeat every 28 days. May take on an empty stomach to decrease nausea & vomiting.   topiramate 50 MG tablet Commonly known as: TOPAMAX TAKE 1 TABLET BY MOUTH TWICE A DAY   traZODone 50 MG tablet Commonly known as: DESYREL Take 1 tablet (50 mg total) by mouth at bedtime as needed for sleep.               Durable Medical Equipment  (From admission, onward)           Start     Ordered   04/28/23 1519  For home use only DME Bedside commode  Once       Question:  Patient needs a bedside commode to treat with the following condition  Answer:  Weakness   04/28/23 1518   04/28/23 1518  For home use only DME Walker rolling  Once       Question Answer Comment  Walker: With 5 Inch Wheels   Patient needs a walker to treat  with the following condition Weakness      04/28/23 1518           No Known Allergies  Follow-up Information     [facility] Follow up.   Specialty: Home Health Services Why: The home health agency will contact you for the first home visit. Contact information: [address] [place] [address] [phone]         [facility] up.   Contact information: [place] [address] [phone]                 The results of significant diagnostics from this hospitalization (including imaging, microbiology, ancillary and laboratory) are listed below for reference.    Significant Diagnostic Studies: Overnight EEG with video  Result Date: 04/25/2023 Charlsie Quest, MD     04/26/2023  9:24 AM ,Patient Name:  MCKINSLEY GONCE MRN: [id number] Epilepsy Attending: Charlsie Quest Referring Physician/Provider: Erick Blinks, MD Duration: 04/24/2023 1455 to 04/25/2023 1455 Patient history: 67 y.o. female with PMH significant for with PMH significant for HLD, HTN, migraine, GBM s/p resection who presents with R sided weakness that has progressively worsened over the last 10 days, much more worse over last 3 days. Her neurologic examination is notable for RUE plegic and RLE weakness. EEG to evaluate for seizure Level of alertness: Awake, asleep AEDs during EEG study: TPM Technical aspects: This EEG study was done with scalp electrodes positioned according to the 10-20 International system of electrode placement. Electrical activity was reviewed with band pass filter of 1-70Hz , sensitivity of 7 uV/mm, display speed of 76mm/sec with a 60Hz  notched filter applied as appropriate. EEG data were recorded continuously and digitally stored.  Video monitoring was available and reviewed as appropriate. Description: The posterior dominant rhythm consists of 8 Hz activity of moderate voltage (25-35 uV) seen predominantly in posterior head regions,  asymmetric ( left<right)  and reactive to eye opening and eye closing.  Sleep was characterized by vertex waves, sleep spindles (12 to 14 Hz), maximal frontocentral region. EEG showed continuous polymorphic 3 to 6 Hz theta-delta slowing in left hemisphere with overriding sharply contoured activity in left centro-temporal region consistent with breach artifact. Hyperventilation and photic stimulation were not performed.   ABNORMALITY - Continuous slow, left hemisphere - Breach artifact, left centro-temporal region - Background asymmetry, left<right IMPRESSION: This study is suggestive of cortical dysfunction arising from left hemisphere likely secondary to underlying tumor. Additionally there is cortical dysfunction in left centro-temporal region consistent with prior craniotomy. No seizures or definite epileptiform discharges were seen throughout the recording. Charlsie Quest   MR BRAIN W WO CONTRAST  Result Date: 04/23/2023 CLINICAL DATA:  Brain/CNS neoplasm, monitor worsening weakness EXAM: MRI HEAD WITHOUT AND WITH CONTRAST TECHNIQUE: Multiplanar, multiecho pulse sequences of the brain and surrounding structures were obtained without and with intravenous contrast. CONTRAST:  7mL GADAVIST GADOBUTROL 1 MMOL/ML IV SOLN COMPARISON:  Brain MR 03/26/23 FINDINGS: Brain: Negative for an acute infarct. No hemorrhage. No hydrocephalus. No extra-axial fluid collection. Compared to prior exam there is new 3 mm rightward midline shift. Postsurgical changes from a left frontal craniotomy with a subjacent resection cavity redemonstrated. Compared to prior exam there is marked interval increase in the degree of T2/FLAIR hyperintense signal abnormality surrounding the contrast-enhanced tumor in the left frontal lobe. T2/FLAIR hyperintense signal abnormality now extends posteriorly to the level of the postcentral gyrus. T2/FLAIR hyperintense signal abnormality also extends to involve a large portion of the opercular region.  The contrast-enhancing portion of the tumor has also increased in size, best seen on the sagittal post contrast-enhanced T1 weighted see series, now measuring up to 4.8 x 2.8 cm, previously 4.1 x 2.4 cm (series 20, image 15). On the axial post contrast-enhanced sequence the increased in size is best seen along the posterior most aspect, where the contrast-enhancing tumor now measures 2.0 x 1.8 cm, previously 1.3 x 0.8 cm. Vascular: Normal flow voids. Skull and upper cervical spine: Normal marrow signal. Sinuses/Orbits: No middle ear or mastoid effusion. Paranasal sinuses are clear. Orbits are unremarkable. Other: None. IMPRESSION: Interval increase in size of contrast-enhancing tumor centered in the left frontal lobe, as well as the surrounding T2/FLAIR hyperintense signal abnormality which now extends posteriorly to the level of the postcentral gyrus. There is also new 3 mm rightward midline shift. Electronically Signed   By: Sandria Senter  Celine Mans M.D.   On: 04/23/2023 19:03   CT Head Wo Contrast  Result Date: 04/23/2023 CLINICAL DATA:  Left-sided weakness, history of glioblastoma EXAM: CT HEAD WITHOUT CONTRAST TECHNIQUE: Contiguous axial images were obtained from the base of the skull through the vertex without intravenous contrast. RADIATION DOSE REDUCTION: This exam was performed according to the departmental dose-optimization program which includes automated exposure control, adjustment of the mA and/or kV according to patient size and/or use of iterative reconstruction technique. COMPARISON:  04/12/2023 FINDINGS: Brain: Vasogenic edema of the bilateral frontal lobes is again identified, left greater than right, slightly more pronounced than prior study. The tumor margins within the left frontal lobe seen on prior MRI are difficult to delineate on this unenhanced CT. No evidence of acute infarct or hemorrhage. Lateral ventricles and remaining midline structures are unremarkable. No acute extra-axial fluid  collections. No mass effect. Vascular: No hyperdense vessel or unexpected calcification. Skull: Prior left frontal craniotomy.  No acute bony abnormality. Sinuses/Orbits: No acute finding. Other: None. IMPRESSION: 1. Continued vasogenic edema within the bilateral frontal lobes, left greater than right, slightly more pronounced than prior study. Tumor margins within the left frontal lobe are poorly delineated on unenhanced CT. 2. No acute infarct or hemorrhage. Electronically Signed   By: Sharlet Salina M.D.   On: 04/23/2023 15:32   CT HEAD WO CONTRAST ( )  Result Date: 04/12/2023 CLINICAL DATA:  Trauma. History of glioblastoma of the frontal lobe. EXAM: CT HEAD WITHOUT CONTRAST TECHNIQUE: Contiguous axial images were obtained from the base of the skull through the vertex without intravenous contrast. RADIATION DOSE REDUCTION: This exam was performed according to the departmental dose-optimization program which includes automated exposure control, adjustment of the mA and/or kV according to patient size and/or use of iterative reconstruction technique. COMPARISON:  Head CT 02/25/2023.  MRI head 03/26/2023. FINDINGS: Brain: Vasogenic edema in the bilateral frontal lobes, left greater than right appear similar to MRI given differences in technique. Left frontal lobe tumor margins are not well delineated on this noncontrast study. There is no acute intracranial hemorrhage, mass effect or midline shift. There is no hydrocephalus. Brain volume is age-appropriate. Vascular: No hyperdense vessel or unexpected calcification. Skull: No acute fractures.  Left frontal craniotomy again seen. Sinuses/Orbits: No acute finding. Other: None. IMPRESSION: 1. No acute intracranial hemorrhage or calvarial fracture. 2. Vasogenic edema in the bilateral frontal lobes appears similar to MRI given differences in technique. Left frontal lobe tumor margins are not well delineated on this noncontrast study. Electronically Signed   By: Darliss Cheney M.D.   On: 04/12/2023 20:13    Microbiology: Recent Results (from the past 240 hour(s))  MRSA Next Gen by PCR, Nasal     Status: None   Collection Time: 04/24/23 10:24 AM   Specimen: Nasal Mucosa; Nasal Swab  Result Value Ref Range Status   MRSA by PCR Next Gen NOT DETECTED NOT DETECTED Final    Comment: (NOTE) The GeneXpert MRSA Assay (FDA approved for NASAL specimens only), is one component of a comprehensive MRSA colonization surveillance program. It is not intended to diagnose MRSA infection nor to guide or monitor treatment for MRSA infections. Test performance is not FDA approved in patients less than 49 years old. Performed at [facility], [address] [place], [place] [address]      Labs: Basic Metabolic Panel: Recent Labs  Lab 04/23/23 1323 04/25/23 0507 04/26/23 0449 04/28/23 0740  NA 140 139 138 137  K 4.0 3.7 3.3*  3.7  CL 112* 113* 105 104  CO2 20* 19* 23 21*  GLUCOSE 127* 123* 125* 143*  BUN 27* 24* 23 27*  CREATININE 1.01* 1.35* 1.02* 0.98  CALCIUM 9.0 8.8* 8.7* 9.4   Liver Function Tests: Recent Labs  Lab 04/23/23 1323 04/26/23 0449  AST 19 17  ALT 21 24  ALKPHOS 62 49  BILITOT 0.5 0.6  PROT 6.6 5.3*  ALBUMIN 3.8 3.1*   No results for input(s): "LIPASE", "AMYLASE" in the last 168 hours. No results for input(s): "AMMONIA" in the last 168 hours. CBC: Recent Labs  Lab 04/23/23 1323 04/26/23 0449  WBC 9.0 8.8  NEUTROABS 8.1* 6.9  HGB 13.5 13.2  HCT 39.0 37.3  MCV 98.5 98.7  PLT 195 151   Cardiac Enzymes: No results for input(s): "CKTOTAL", "CKMB", "CKMBINDEX", "TROPONINI" in the last 168 hours. BNP: BNP (last 3 results) No results for input(s): "BNP" in the last 8760 hours.  ProBNP (last 3 results) No results for input(s): "PROBNP" in the last 8760 hours.  CBG: No results for input(s): "GLUCAP" in the last 168 hours.     Signed:  Rhetta Mura MD   [facility] 04/28/2023, 5:50 PM

## 2023-04-28 NOTE — Progress Notes (Signed)
Inpatient Rehab Admissions Coordinator:    CIR following. Awaiting decision on whether or not Pt. Is to require surgery. I will follow up once treatment plan is clear.   Megan Salon, MS, CCC-SLP Rehab Admissions Coordinator  [phone] (celll) [phone] (office)

## 2023-04-28 NOTE — Progress Notes (Signed)
    Durable Medical Equipment  (From admission, onward)           Start     Ordered   04/28/23 1519  For home use only DME Bedside commode  Once       Question:  Patient needs a bedside commode to treat with the following condition  Answer:  Weakness   04/28/23 1518   04/28/23 1518  For home use only DME Walker rolling  Once       Question Answer Comment  Walker: With 5 Inch Wheels   Patient needs a walker to treat with the following condition Weakness      04/28/23 1518            BSC note:  The patient is confined to one level of the home environment and there is no toilet on the that level of the home.

## 2023-04-29 ENCOUNTER — Other Ambulatory Visit (HOSPITAL_COMMUNITY): Payer: Self-pay

## 2023-04-29 DIAGNOSIS — R29898 Other symptoms and signs involving the musculoskeletal system: Secondary | ICD-10-CM

## 2023-04-29 DIAGNOSIS — C719 Malignant neoplasm of brain, unspecified: Secondary | ICD-10-CM | POA: Diagnosis not present

## 2023-04-29 DIAGNOSIS — R531 Weakness: Secondary | ICD-10-CM | POA: Diagnosis not present

## 2023-04-29 MED ORDER — DEXAMETHASONE 2 MG PO TABS
2.0000 mg | ORAL_TABLET | Freq: Four times a day (QID) | ORAL | 0 refills | Status: DC
  Filled 2023-04-29 – 2023-05-09 (×2): qty 60, 15d supply, fill #0

## 2023-04-29 MED ORDER — LACOSAMIDE 50 MG PO TABS
50.0000 mg | ORAL_TABLET | Freq: Two times a day (BID) | ORAL | 0 refills | Status: DC
  Filled 2023-04-29: qty 60, 30d supply, fill #0

## 2023-04-29 NOTE — Progress Notes (Signed)
Inpatient Rehab Admissions Coordinator:    Pt. To go home as she awaits surgery next week.  If therapy feels she needs CIR post-op they can refer Pt. To CIR again at that point. CIR will sign off.   Megan Salon, MS, CCC-SLP Rehab Admissions Coordinator  [phone] (celll) [phone] (office)

## 2023-04-29 NOTE — TOC Transition Note (Signed)
Transition of Care [facility]) - CM/SW Discharge Note   Patient Details  Name: Jennifer [name] MRN: [id number] Date of Birth: 01-25-1956  [facility] CM/SW Contact:  Kermit Balo, RN Phone Number: 04/29/2023, 11:18 AM   Clinical Narrative:    Pt is discharging home with home health services through [facility]. Information on the AVS.  DME for home was delivered to the room  per [facility].  Son to provide transportation home.    Final next level of care: Home w Home Health Services Barriers to Discharge: No Barriers Identified   Patient Goals and CMS Choice CMS Medicare.gov Compare Post Acute Care list provided to:: Patient Represenative (must comment) Choice offered to / list presented to : Adult Children  Discharge Placement                         Discharge Plan and Services Additional resources added to the After Visit Summary for     Discharge Planning Services: CM Consult Post Acute Care Choice: Durable Medical Equipment, Home Health          DME Arranged: Bedside commode, Walker rolling DME Agency: [facility] Date DME Agency Contacted: 04/28/23   Representative spoke with at DME Agency: [facility] HH Arranged: PT, OT, Nurse's Aide HH Agency: [facility] Date [facility] Agency Contacted: 04/29/23   Representative spoke with at [facility] Agency: Kandee Keen  Social Determinants of Health (SDOH) Interventions SDOH Screenings   Food Insecurity: No Food Insecurity (02/26/2022)   Received from [facility]  Transportation Needs: No Transportation Needs (02/26/2022)   Received from [facility]  Utilities: Not At Risk (02/26/2022)   [facility] from [facility] visits prior to 10/25/2022., [facility] visits prior to 10/25/2022.  Depression (PHQ2-9): Low Risk  (03/10/2019)  Financial Resource Strain: Low Risk  (02/26/2022)   Received from [facility]  Physical Activity: Insufficiently Active (02/26/2022)   Received from [facility]   Social Connections: Socially Isolated (02/26/2022)   Received from [facility]  Stress: Stress Concern Present (02/26/2022)   Received from [facility]  Tobacco Use: Low Risk  (04/23/2023)     Readmission Risk Interventions     No data to display

## 2023-04-29 NOTE — Progress Notes (Signed)
Confirmed DC instructions was received. RN reviewed the DC instructions with pt. Earlier. pt and her son both confirmed they understood. Pt equipment was retrieved in son took home 04-28-2023. Pt wheeled down by this RN. TOC meds will be retrieved by this RN in route.

## 2023-04-29 NOTE — Discharge Summary (Signed)
Physician Discharge Summary  Jennifer [name]:[id number] DOB: [birth year] DOA: 04/23/2023  PCP: Judd Lien, PA-C  Admit date: 04/23/2023 Discharge date: 04/29/2023  Time spent: 36 minutes  Recommendations for Outpatient Follow-up:  Patient discharging home with home health Neurosurgeon Dr. Franky Macho will communicate surgical plan with patient and family Continues on new medication of Vimpat 50 twice daily, Decadron every 6 2 mg Needs outpatient Chem-12 CBC 1 week  Discharge Diagnoses:  MAIN problem for hospitalization   Vasogenic edema causing hemiplegic leg symptoms and altered mental status/metabolic encephalopathy on admission now resolving AKI on admission mild metabolic acidosis Migraines  Please see below for itemized issues addressed in HOpsital- refer to other progress notes for clarity if needed  Discharge Condition: Fair  Diet recommendation: Heart healthy  There were no vitals filed for this visit.  History of present illness:  67 year old home dwelling female HTN HLD migraines GBM status postresection 04/16/2022 Sees neuro oncologist Dr. Barbaraann Cao is on concurrent Temodar with maintenance Temodar as well Was seen in clinic by Dr. Barbaraann Cao 03/31/2023 last-had completed 6 cycles of Temodar but had progression of disease apparently on MR Presented to [facility] 8/29 with full right-sided deficit CT showed vasogenic edema MRI showed increasing size of tumor--- telemetry-neurology evaluated the patient and recommended transfer to [facility] Presented with right-sided weakness mental status changes 8/30 neurology and neuro-oncology consulted--neurosurgery consulted 8/31 neurosurgery seen patient awaiting formal planning BUN/creatinine 27/1 Patient given Decadron neurology consulted sent to progressive bed   Plan   R-sided weakness in the setting of cerebral edema from GBM Outpatient follow-up with Dr. Sueanne Margarita office regarding surgical timing Vimpat 50 twice daily  Topamax 50 twice daily Decadron 2 mg every 6 until Dr. Franky Macho determines otherwise Patient go home with home health return for surgery and likely need CIR subsequently   AKI on admission Mild met acidosis Improved  Migraine Continue sumatriptan 50 twice daily as needed migraine as well as Topamax   HTN continue losartan 50 daily, amlodipine 5 daily  : Vitals:   04/29/23 0825 04/29/23 1151  BP: 111/79 115/81  Pulse: 93 88  Resp:  18  Temp: 98.8 F (37.1 C) 98.7 F (37.1 C)  SpO2: 98% 100%     General Exam on discharge  Awake alert coherent smiling happy Mild hemiparesis right upper extremity right lower extremity was ambulating more-with therapy did require increased assistance with dual tasking was able to walk however 75 feet Abdomen is soft no rebound S1-S2 no murmur  Discharge Instructions   Discharge Instructions     Diet - low sodium heart healthy   Complete by: As directed    Discharge instructions   Complete by: As directed    Dr. Franky Macho as well as Dr. Barbaraann Cao are directing your care and we will call you about steroid dosing, recurrent surgery, other planning Please contact their offices if there are issues with pain weakness etc. We will order home health for you   Increase activity slowly   Complete by: As directed       Allergies as of 04/29/2023   No Known Allergies      Medication List     STOP taking these medications    levETIRAcetam 500 MG tablet Commonly known as: Keppra   meloxicam 7.5 MG tablet Commonly known as: MOBIC   Neo-Synephrine Cold/Allrgy Ext 1 % nasal spray Generic drug: phenylephrine   traMADol 50 MG tablet Commonly known as: ULTRAM       TAKE these  medications    amLODipine 5 MG tablet Commonly known as: NORVASC TAKE 1 TABLET BY MOUTH EVERY DAY   atorvastatin 10 MG tablet Commonly known as: LIPITOR TAKE 1 TABLET BY MOUTH EVERY DAY   busPIRone 5 MG tablet Commonly known as: BUSPAR TAKE 1 TABLET BY MOUTH  THREE TIMES A DAY   dexamethasone 2 MG tablet Commonly known as: DECADRON Take 1 tablet (2 mg total) by mouth every 6 (six) hours. What changed: when to take this   fluticasone 50 MCG/ACT nasal spray Commonly known as: FLONASE SPRAY 2 SPRAYS INTO EACH NOSTRIL EVERY DAY   lacosamide 50 MG Tabs tablet Commonly known as: VIMPAT Take 1 tablet (50 mg total) by mouth 2 (two) times daily.   losartan 50 MG tablet Commonly known as: COZAAR TAKE 1 TABLET BY MOUTH EVERY DAY   ondansetron 8 MG tablet Commonly known as: Zofran Take 1 tablet (8 mg total) by mouth every 8 (eight) hours as needed for nausea or vomiting. May take 30-60 minutes prior to Temodar administration if nausea/vomiting occurs as needed.   prochlorperazine 10 MG tablet Commonly known as: COMPAZINE Take 1 tablet (10 mg total) by mouth every 6 (six) hours as needed for nausea or vomiting.   SUMAtriptan 50 MG tablet Commonly known as: IMITREX Take 50 mg by mouth every 2 (two) hours as needed for migraine. May repeat in 2 hours if headache persists or recurs.   temozolomide 250 MG capsule Commonly known as: TEMODAR Take 1 capsule (250 mg total) by mouth daily. Take for 5 days on, 23 days off. Repeat every 28 days. May take on an empty stomach to decrease nausea & vomiting.   topiramate 50 MG tablet Commonly known as: TOPAMAX TAKE 1 TABLET BY MOUTH TWICE A DAY   traZODone 50 MG tablet Commonly known as: DESYREL Take 1 tablet (50 mg total) by mouth at bedtime as needed for sleep.       No Known Allergies  Follow-up Information     [facility] Follow up.   Specialty: Home Health Services Why: The home health agency will contact you for the first home visit. Contact information: [address] [place] [address] [phone]         [facility] Follow up.   Contact information: [place] [address] [phone]                 The results of  significant diagnostics from this hospitalization (including imaging, microbiology, ancillary and laboratory) are listed below for reference.    Significant Diagnostic Studies: Overnight EEG with video  Result Date: 04/25/2023 Charlsie Quest, MD     04/26/2023  9:24 AM ,[name] MRN: [id number] Epilepsy Attending: Charlsie Quest Referring Physician/Provider: Erick Blinks, MD Duration: 04/24/2023 1455 to 04/25/2023 1455 Patient history: 67 y.o. female with PMH significant for with PMH significant for HLD, HTN, migraine, GBM s/p resection who presents with R sided weakness that has progressively worsened over the last 10 days, much more worse over last 3 days. Her neurologic examination is notable for RUE plegic and RLE weakness. EEG to evaluate for seizure Level of alertness: Awake, asleep AEDs during EEG study: TPM Technical aspects: This EEG study was done with scalp electrodes positioned according to the 10-20 International system of electrode placement. Electrical activity was reviewed with band pass filter of 1-70Hz , sensitivity of 7 uV/mm, display speed of 24mm/sec with a 60Hz  notched filter  applied as appropriate. EEG data were recorded continuously and digitally stored.  Video monitoring was available and reviewed as appropriate. Description: The posterior dominant rhythm consists of 8 Hz activity of moderate voltage (25-35 uV) seen predominantly in posterior head regions, asymmetric ( left<right)  and reactive to eye opening and eye closing.  Sleep was characterized by vertex waves, sleep spindles (12 to 14 Hz), maximal frontocentral region. EEG showed continuous polymorphic 3 to 6 Hz theta-delta slowing in left hemisphere with overriding sharply contoured activity in left centro-temporal region consistent with breach artifact. Hyperventilation and photic stimulation were not performed.   ABNORMALITY - Continuous slow, left hemisphere - Breach artifact, left centro-temporal  region - Background asymmetry, left<right IMPRESSION: This study is suggestive of cortical dysfunction arising from left hemisphere likely secondary to underlying tumor. Additionally there is cortical dysfunction in left centro-temporal region consistent with prior craniotomy. No seizures or definite epileptiform discharges were seen throughout the recording. Charlsie Quest   MR BRAIN W WO CONTRAST  Result Date: 04/23/2023 CLINICAL DATA:  Brain/CNS neoplasm, monitor worsening weakness EXAM: MRI HEAD WITHOUT AND WITH CONTRAST TECHNIQUE: Multiplanar, multiecho pulse sequences of the brain and surrounding structures were obtained without and with intravenous contrast. CONTRAST:  7mL GADAVIST GADOBUTROL 1 MMOL/ML IV SOLN COMPARISON:  Brain MR 03/26/23 FINDINGS: Brain: Negative for an acute infarct. No hemorrhage. No hydrocephalus. No extra-axial fluid collection. Compared to prior exam there is new 3 mm rightward midline shift. Postsurgical changes from a left frontal craniotomy with a subjacent resection cavity redemonstrated. Compared to prior exam there is marked interval increase in the degree of T2/FLAIR hyperintense signal abnormality surrounding the contrast-enhanced tumor in the left frontal lobe. T2/FLAIR hyperintense signal abnormality now extends posteriorly to the level of the postcentral gyrus. T2/FLAIR hyperintense signal abnormality also extends to involve a large portion of the opercular region. The contrast-enhancing portion of the tumor has also increased in size, best seen on the sagittal post contrast-enhanced T1 weighted see series, now measuring up to 4.8 x 2.8 cm, previously 4.1 x 2.4 cm (series 20, image 15). On the axial post contrast-enhanced sequence the increased in size is best seen along the posterior most aspect, where the contrast-enhancing tumor now measures 2.0 x 1.8 cm, previously 1.3 x 0.8 cm. Vascular: Normal flow voids. Skull and upper cervical spine: Normal marrow signal.  Sinuses/Orbits: No middle ear or mastoid effusion. Paranasal sinuses are clear. Orbits are unremarkable. Other: None. IMPRESSION: Interval increase in size of contrast-enhancing tumor centered in the left frontal lobe, as well as the surrounding T2/FLAIR hyperintense signal abnormality which now extends posteriorly to the level of the postcentral gyrus. There is also new 3 mm rightward midline shift. Electronically Signed   By: Lorenza Cambridge M.D.   On: 04/23/2023 19:03   CT Head Wo Contrast  Result Date: 04/23/2023 CLINICAL DATA:  Left-sided weakness, history of glioblastoma EXAM: CT HEAD WITHOUT CONTRAST TECHNIQUE: Contiguous axial images were obtained from the base of the skull through the vertex without intravenous contrast. RADIATION DOSE REDUCTION: This exam was performed according to the departmental dose-optimization program which includes automated exposure control, adjustment of the mA and/or kV according to patient size and/or use of iterative reconstruction technique. COMPARISON:  04/12/2023 FINDINGS: Brain: Vasogenic edema of the bilateral frontal lobes is again identified, left greater than right, slightly more pronounced than prior study. The tumor margins within the left frontal lobe seen on prior MRI are difficult to delineate on this unenhanced CT. No evidence of acute  infarct or hemorrhage. Lateral ventricles and remaining midline structures are unremarkable. No acute extra-axial fluid collections. No mass effect. Vascular: No hyperdense vessel or unexpected calcification. Skull: Prior left frontal craniotomy.  No acute bony abnormality. Sinuses/Orbits: No acute finding. Other: None. IMPRESSION: 1. Continued vasogenic edema within the bilateral frontal lobes, left greater than right, slightly more pronounced than prior study. Tumor margins within the left frontal lobe are poorly delineated on unenhanced CT. 2. No acute infarct or hemorrhage. Electronically Signed   By: Sharlet Salina M.D.   On:  04/23/2023 15:32   CT HEAD WO CONTRAST ( )  Result Date: 04/12/2023 CLINICAL DATA:  Trauma. History of glioblastoma of the frontal lobe. EXAM: CT HEAD WITHOUT CONTRAST TECHNIQUE: Contiguous axial images were obtained from the base of the skull through the vertex without intravenous contrast. RADIATION DOSE REDUCTION: This exam was performed according to the departmental dose-optimization program which includes automated exposure control, adjustment of the mA and/or kV according to patient size and/or use of iterative reconstruction technique. COMPARISON:  Head CT 02/25/2023.  MRI head 03/26/2023. FINDINGS: Brain: Vasogenic edema in the bilateral frontal lobes, left greater than right appear similar to MRI given differences in technique. Left frontal lobe tumor margins are not well delineated on this noncontrast study. There is no acute intracranial hemorrhage, mass effect or midline shift. There is no hydrocephalus. Brain volume is age-appropriate. Vascular: No hyperdense vessel or unexpected calcification. Skull: No acute fractures.  Left frontal craniotomy again seen. Sinuses/Orbits: No acute finding. Other: None. IMPRESSION: 1. No acute intracranial hemorrhage or calvarial fracture. 2. Vasogenic edema in the bilateral frontal lobes appears similar to MRI given differences in technique. Left frontal lobe tumor margins are not well delineated on this noncontrast study. Electronically Signed   By: Darliss Cheney M.D.   On: 04/12/2023 20:13    Microbiology: Recent Results (from the past 240 hour(s))  MRSA Next Gen by PCR, Nasal     Status: None   Collection Time: 04/24/23 10:24 AM   Specimen: Nasal Mucosa; Nasal Swab  Result Value Ref Range Status   MRSA by PCR Next Gen NOT DETECTED NOT DETECTED Final    Comment: (NOTE) The GeneXpert MRSA Assay (FDA approved for NASAL specimens only), is one component of a comprehensive MRSA colonization surveillance program. It is not intended to diagnose MRSA  infection nor to guide or monitor treatment for MRSA infections. Test performance is not FDA approved in patients less than 54 years old. Performed at [facility], [address], [place], [place] [address]      Labs: Basic Metabolic Panel: Recent Labs  Lab 04/23/23 1323 04/25/23 0507 04/26/23 0449 04/28/23 0740  NA 140 139 138 137  K 4.0 3.7 3.3* 3.7  CL 112* 113* 105 104  CO2 20* 19* 23 21*  GLUCOSE 127* 123* 125* 143*  BUN 27* 24* 23 27*  CREATININE 1.01* 1.35* 1.02* 0.98  CALCIUM 9.0 8.8* 8.7* 9.4   Liver Function Tests: Recent Labs  Lab 04/23/23 1323 04/26/23 0449  AST 19 17  ALT 21 24  ALKPHOS 62 49  BILITOT 0.5 0.6  PROT 6.6 5.3*  ALBUMIN 3.8 3.1*   No results for input(s): "LIPASE", "AMYLASE" in the last 168 hours. No results for input(s): "AMMONIA" in the last 168 hours. CBC: Recent Labs  Lab 04/23/23 1323 04/26/23 0449  WBC 9.0 8.8  NEUTROABS 8.1* 6.9  HGB 13.5 13.2  HCT 39.0 37.3  MCV 98.5 98.7  PLT 195 151  Cardiac Enzymes: No results for input(s): "CKTOTAL", "CKMB", "CKMBINDEX", "TROPONINI" in the last 168 hours. BNP: BNP (last 3 results) No results for input(s): "BNP" in the last 8760 hours.  ProBNP (last 3 results) No results for input(s): "PROBNP" in the last 8760 hours.  CBG: No results for input(s): "GLUCAP" in the last 168 hours.     Signed:  Rickey Barbara MD   [facility] 04/29/2023, 7:19 PM

## 2023-04-29 NOTE — Progress Notes (Signed)
Physical Therapy Treatment Patient Details Name: Jennifer [name] MRN: [id number] DOB: 1955-08-28 Today's Date: 04/29/2023   History of Present Illness Patient is a 67 y.o. female admitted 8/29 for right sided weakness. 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    PT Comments  Patient eager to participate with PT session and reports she will now be going home to await anticipated surgery next week. Family education provided with patient and son in preparation for discharge home. Gait training progressed in hallway with improved independence and gait pattern with limiting external distractions and providing single step commands. Car transfer techniques discussed. Patient completed stair training per son request with contact guard assistance provided and frequent cues for sequencing. If patient is discharging home, recommend to limit external distractions during mobility, provide single step cues, and to have supervision/assistance with mobility for safety. Patient is making progress towards meeting goals and PT will continue to follow.     If plan is discharge home, recommend the following: A lot of help with bathing/dressing/bathroom;Assist for transportation;Help with stairs or ramp for entrance;Supervision due to cognitive status;Assistance with cooking/housework;A lot of help with walking and/or transfers;Direct supervision/assist for medications management;Direct supervision/assist for financial management   Can travel by private vehicle        Equipment Recommendations  None recommended by PT    Recommendations for Other Services Rehab consult     Precautions / Restrictions Precautions Precautions: Fall Restrictions Weight Bearing Restrictions: No     Mobility  Bed  Mobility Overal bed mobility: Needs Assistance Bed Mobility: Supine to Sit       Sit to supine: Contact guard assist, Used rails, HOB elevated   General bed mobility comments: increased time required    Transfers Overall transfer level: Needs assistance Equipment used: Rolling walker (2 wheels) Transfers: Sit to/from Stand Sit to Stand: Contact guard assist           General transfer comment: verbal cues for hand placement with each standing bout with minimal carry over of instruction. patient and son educated on car transfer techniques as well    Ambulation/Gait Ambulation/Gait assistance: Min Chemical engineer (Feet): 50 Feet Assistive device: Rolling walker (2 wheels) Gait Pattern/deviations: Step-through pattern, Decreased step length - right, Decreased stance time - right, Drifts right/left Gait velocity: decreased     General Gait Details: patient has staggering to the right x 1 bout with turning the rolling walker. frequent cues for increased step length on the right side. balance is improved with single step commands and limiting distractions. son present throughout session and education provided on limiting distractions, etc.   Stairs Stairs: Yes Stairs assistance: Contact guard assist Stair Management: One rail Right, One rail Left, Step to pattern, Forwards Number of Stairs: 2 General stair comments: patient went up/down 2 steps with CGA for safety. maximal verbal cues for proper sequencing to faciliate independence and safety. patient does better with single step commands and limiting external distractions   Wheelchair Mobility     Tilt Bed    Modified Rankin (Stroke Patients Only)       Balance Overall balance assessment: Needs assistance Sitting-balance support: Feet supported, No upper extremity supported Sitting balance-Leahy Scale: Good     Standing balance support: Single extremity supported Standing balance-Leahy Scale: Poor Standing  balance comment: external support required. dynamic balance worsens with  external distractions                            Cognition Arousal: Alert Behavior During Therapy: Lability, Impulsive Overall Cognitive Status: Impaired/Different from baseline Area of Impairment: Safety/judgement, Problem solving                       Following Commands: Follows one step commands consistently, Follows multi-step commands inconsistently Safety/Judgement: Decreased awareness of safety, Decreased awareness of deficits   Problem Solving: Slow processing, Requires verbal cues, Requires tactile cues General Comments: easily distracted, cues for attention to tasks        Exercises      General Comments        Pertinent Vitals/Pain Pain Assessment Pain Assessment: No/denies pain    Home Living                          Prior Function            PT Goals (current goals can now be found in the care plan section) Acute Rehab PT Goals Patient Stated Goal: to go home PT Goal Formulation: With patient Time For Goal Achievement: 05/08/23 Potential to Achieve Goals: Good Progress towards PT goals: Progressing toward goals    Frequency    Min 1X/week      PT Plan      Co-evaluation              AM-PAC PT "6 Clicks" Mobility   Outcome Measure  Help needed turning from your back to your side while in a flat bed without using bedrails?: A Little Help needed moving from lying on your back to sitting on the side of a flat bed without using bedrails?: A Little Help needed moving to and from a bed to a chair (including a wheelchair)?: A Lot Help needed standing up from a chair using your arms (e.g., wheelchair or bedside chair)?: A Lot Help needed to walk in hospital room?: A Lot Help needed climbing 3-5 steps with a railing? : Total 6 Click Score: 13    End of Session Equipment Utilized During Treatment: Gait belt Activity Tolerance: Patient  tolerated treatment well Patient left: in chair;with call bell/phone within reach;with chair alarm set;with family/visitor present   PT Visit Diagnosis: Difficulty in walking, not elsewhere classified (R26.2);Other abnormalities of gait and mobility (R26.89)     Time: 4782-9562 PT Time Calculation (min) (ACUTE ONLY): 28 min  Charges:    $Gait Training: 8-22 mins $Therapeutic Activity: 8-22 mins PT General Charges $$ ACUTE PT VISIT: 1 Visit                    Donna Bernard, PT, MPT    [facility] 04/29/2023, 9:26 AM

## 2023-04-30 ENCOUNTER — Other Ambulatory Visit: Payer: Self-pay | Admitting: Neurosurgery

## 2023-05-01 ENCOUNTER — Other Ambulatory Visit: Payer: Self-pay

## 2023-05-01 ENCOUNTER — Other Ambulatory Visit (HOSPITAL_COMMUNITY): Payer: Self-pay | Admitting: Neurosurgery

## 2023-05-01 DIAGNOSIS — D496 Neoplasm of unspecified behavior of brain: Secondary | ICD-10-CM

## 2023-05-04 ENCOUNTER — Ambulatory Visit (HOSPITAL_COMMUNITY)
Admission: RE | Admit: 2023-05-04 | Discharge: 2023-05-04 | Disposition: A | Discharge status: Home / Self Care | Payer: Medicare Other | Source: Ambulatory Visit | Attending: Neurosurgery | Admitting: Neurosurgery

## 2023-05-04 DIAGNOSIS — D496 Neoplasm of unspecified behavior of brain: Secondary | ICD-10-CM | POA: Insufficient documentation

## 2023-05-04 MED ORDER — GADOBUTROL 1 MMOL/ML IV SOLN
8.0000 mL | Freq: Once | INTRAVENOUS | Status: AC | PRN
  Administered 2023-05-04: 8 mL via INTRAVENOUS

## 2023-05-05 ENCOUNTER — Encounter (HOSPITAL_COMMUNITY): Payer: Self-pay | Admitting: Neurosurgery

## 2023-05-05 ENCOUNTER — Other Ambulatory Visit: Payer: Self-pay

## 2023-05-05 NOTE — Progress Notes (Signed)
PCP - Meghan Valaria Good, PA-C Cardiologist - none Oncology - Dr Elissa Hefty  Chest x-ray - n/a EKG - 04/23/23 Stress Test - n/a ECHO - n/a Cardiac Cath - n/a  ICD Pacemaker/Loop - n/a  Sleep Study -  n/a CPAP - none  Diabetes Type - n/a  ERAS: Clear liquids til 12:30 PM DOS  Anesthesia review: Yes  STOP now taking any Aspirin (unless otherwise instructed by your surgeon), Aleve, Naproxen, Ibuprofen, Motrin, Advil, Goody's, BC's, all herbal medications, fish oil, and all vitamins.   Coronavirus Screening Do you have any of the following symptoms:  Cough yes/no: No Fever (>100.70F)  yes/no: No Runny nose yes/no: No Sore throat yes/no: No Difficulty breathing/shortness of breath  yes/no: No  Have you traveled in the last 14 days and where? yes/no: No  Patient verbalized understanding of instructions that were given via phone.

## 2023-05-06 ENCOUNTER — Encounter (HOSPITAL_COMMUNITY)
Admission: RE | Disposition: A | Discharge status: Home / Self Care | Payer: Self-pay | Source: Home / Self Care | Attending: Neurosurgery

## 2023-05-06 ENCOUNTER — Inpatient Hospital Stay (HOSPITAL_COMMUNITY): Payer: Medicare Other | Admitting: Physician Assistant

## 2023-05-06 ENCOUNTER — Inpatient Hospital Stay (HOSPITAL_COMMUNITY)
Admission: RE | Admit: 2023-05-06 | Discharge: 2023-05-08 | DRG: 023 | Disposition: A | Discharge status: Home / Self Care | Payer: Medicare Other | Source: Ambulatory Visit | Attending: Neurosurgery | Admitting: Neurosurgery

## 2023-05-06 ENCOUNTER — Encounter (HOSPITAL_COMMUNITY): Payer: Self-pay | Admitting: Neurosurgery

## 2023-05-06 ENCOUNTER — Other Ambulatory Visit: Payer: Self-pay

## 2023-05-06 DIAGNOSIS — C719 Malignant neoplasm of brain, unspecified: Secondary | ICD-10-CM | POA: Diagnosis present

## 2023-05-06 DIAGNOSIS — C711 Malignant neoplasm of frontal lobe: Secondary | ICD-10-CM | POA: Diagnosis present

## 2023-05-06 DIAGNOSIS — Z9049 Acquired absence of other specified parts of digestive tract: Secondary | ICD-10-CM | POA: Diagnosis not present

## 2023-05-06 DIAGNOSIS — G43909 Migraine, unspecified, not intractable, without status migrainosus: Secondary | ICD-10-CM | POA: Diagnosis present

## 2023-05-06 DIAGNOSIS — R569 Unspecified convulsions: Secondary | ICD-10-CM | POA: Diagnosis present

## 2023-05-06 DIAGNOSIS — I10 Essential (primary) hypertension: Secondary | ICD-10-CM | POA: Diagnosis present

## 2023-05-06 DIAGNOSIS — E785 Hyperlipidemia, unspecified: Secondary | ICD-10-CM

## 2023-05-06 DIAGNOSIS — M797 Fibromyalgia: Secondary | ICD-10-CM | POA: Diagnosis present

## 2023-05-06 DIAGNOSIS — Z9889 Other specified postprocedural states: Principal | ICD-10-CM

## 2023-05-06 DIAGNOSIS — Z780 Asymptomatic menopausal state: Secondary | ICD-10-CM | POA: Diagnosis not present

## 2023-05-06 HISTORY — DX: Unspecified convulsions: R56.9

## 2023-05-06 HISTORY — PX: CRANIOTOMY: SHX93

## 2023-05-06 HISTORY — PX: APPLICATION OF CRANIAL NAVIGATION: SHX6578

## 2023-05-06 HISTORY — DX: Fibromyalgia: M79.7

## 2023-05-06 LAB — POCT I-STAT 7, (LYTES, BLD GAS, ICA,H+H)
Acid-base deficit: 6 mmol/L — ABNORMAL HIGH (ref 0.0–2.0)
Acid-base deficit: 6 mmol/L — ABNORMAL HIGH (ref 0.0–2.0)
Bicarbonate: 18 mmol/L — ABNORMAL LOW (ref 20.0–28.0)
Bicarbonate: 17.7 mmol/L — ABNORMAL LOW (ref 20.0–28.0)
Calcium, Ion: 1.13 mmol/L — ABNORMAL LOW (ref 1.15–1.40)
Calcium, Ion: 1.14 mmol/L — ABNORMAL LOW (ref 1.15–1.40)
HCT: 32 % — ABNORMAL LOW (ref 36.0–46.0)
HCT: 32 % — ABNORMAL LOW (ref 36.0–46.0)
Hemoglobin: 10.9 g/dL — ABNORMAL LOW (ref 12.0–15.0)
Hemoglobin: 10.9 g/dL — ABNORMAL LOW (ref 12.0–15.0)
O2 Saturation: 100 %
O2 Saturation: 100 %
Patient temperature: 35.4
Potassium: 3.7 mmol/L (ref 3.5–5.1)
Potassium: 3.8 mmol/L (ref 3.5–5.1)
Sodium: 138 mmol/L (ref 135–145)
Sodium: 137 mmol/L (ref 135–145)
TCO2: 19 mmol/L — ABNORMAL LOW (ref 22–32)
TCO2: 19 mmol/L — ABNORMAL LOW (ref 22–32)
pCO2 arterial: 31.4 mmHg — ABNORMAL LOW (ref 32–48)
pCO2 arterial: 26.6 mmHg — ABNORMAL LOW (ref 32–48)
pH, Arterial: 7.367 (ref 7.35–7.45)
pH, Arterial: 7.426 (ref 7.35–7.45)
pO2, Arterial: 239 mmHg — ABNORMAL HIGH (ref 83–108)
pO2, Arterial: 211 mmHg — ABNORMAL HIGH (ref 83–108)

## 2023-05-06 LAB — CBC
HCT: 36.7 % (ref 36.0–46.0)
Hemoglobin: 12.5 g/dL (ref 12.0–15.0)
MCH: 33.5 pg (ref 26.0–34.0)
MCHC: 34.1 g/dL (ref 30.0–36.0)
MCV: 98.4 fL (ref 80.0–100.0)
Platelets: 152 10*3/uL (ref 150–400)
RBC: 3.73 MIL/uL — ABNORMAL LOW (ref 3.87–5.11)
RDW: 12.4 % (ref 11.5–15.5)
WBC: 20.8 10*3/uL — ABNORMAL HIGH (ref 4.0–10.5)
nRBC: 0 % (ref 0.0–0.2)

## 2023-05-06 LAB — CREATININE, SERUM
Creatinine, Ser: 1.08 mg/dL — ABNORMAL HIGH (ref 0.44–1.00)
GFR, Estimated: 57 mL/min — ABNORMAL LOW (ref >60)

## 2023-05-06 LAB — TYPE AND SCREEN
ABO/RH(D): O POS
Antibody Screen: NEGATIVE

## 2023-05-06 LAB — MRSA NEXT GEN BY PCR, NASAL: MRSA by PCR Next Gen: NOT DETECTED

## 2023-05-06 SURGERY — CRANIOTOMY TUMOR EXCISION
Anesthesia: General | Site: Head | Laterality: Left

## 2023-05-06 MED ORDER — ACETAMINOPHEN 10 MG/ML IV SOLN
INTRAVENOUS | Status: AC
  Filled 2023-05-06: qty 100

## 2023-05-06 MED ORDER — DEXAMETHASONE 4 MG PO TABS: 4.0000 mg | ORAL_TABLET | Freq: Three times a day (TID) | ORAL | Status: DC

## 2023-05-06 MED ORDER — DEXAMETHASONE 4 MG PO TABS
4.0000 mg | ORAL_TABLET | Freq: Four times a day (QID) | ORAL | Status: AC
  Administered 2023-05-07 – 2023-05-08 (×4): 4 mg via ORAL
  Filled 2023-05-06 (×4): qty 1

## 2023-05-06 MED ORDER — CARMUSTINE IN POLIFEPROSAN 7.7 MG IL WAFR
WAFER | Status: DC | PRN
  Administered 2023-05-06: 5 via TOPICAL

## 2023-05-06 MED ORDER — BACITRACIN ZINC 500 UNIT/GM EX OINT
TOPICAL_OINTMENT | CUTANEOUS | Status: DC | PRN
  Administered 2023-05-06: 1 via TOPICAL

## 2023-05-06 MED ORDER — SUGAMMADEX SODIUM 200 MG/2ML IV SOLN
INTRAVENOUS | Status: DC | PRN
  Administered 2023-05-06: 300 mg via INTRAVENOUS

## 2023-05-06 MED ORDER — CHLORHEXIDINE GLUCONATE CLOTH 2 % EX PADS
6.0000 | MEDICATED_PAD | Freq: Every day | CUTANEOUS | Status: DC
  Administered 2023-05-06 – 2023-05-08 (×3): 6 via TOPICAL

## 2023-05-06 MED ORDER — FENTANYL CITRATE (PF) 250 MCG/5ML IJ SOLN
INTRAMUSCULAR | Status: AC
  Filled 2023-05-06: qty 5

## 2023-05-06 MED ORDER — BUSPIRONE HCL 10 MG PO TABS
5.0000 mg | ORAL_TABLET | Freq: Three times a day (TID) | ORAL | Status: DC
  Administered 2023-05-06 – 2023-05-08 (×7): 5 mg via ORAL
  Filled 2023-05-06 (×7): qty 1

## 2023-05-06 MED ORDER — DEXAMETHASONE SODIUM PHOSPHATE 10 MG/ML IJ SOLN
INTRAMUSCULAR | Status: AC
  Filled 2023-05-06: qty 1

## 2023-05-06 MED ORDER — LIDOCAINE-EPINEPHRINE 0.5 %-1:200000 IJ SOLN
INTRAMUSCULAR | Status: AC
  Filled 2023-05-06: qty 50

## 2023-05-06 MED ORDER — LABETALOL HCL 5 MG/ML IV SOLN
INTRAVENOUS | Status: AC
  Filled 2023-05-06: qty 4

## 2023-05-06 MED ORDER — ROCURONIUM BROMIDE 10 MG/ML (PF) SYRINGE
PREFILLED_SYRINGE | INTRAVENOUS | Status: AC
  Filled 2023-05-06: qty 10

## 2023-05-06 MED ORDER — DEXAMETHASONE SODIUM PHOSPHATE 10 MG/ML IJ SOLN
INTRAMUSCULAR | Status: DC | PRN
  Administered 2023-05-06: 10 mg via INTRAVENOUS

## 2023-05-06 MED ORDER — BACITRACIN ZINC 500 UNIT/GM EX OINT
TOPICAL_OINTMENT | CUTANEOUS | Status: AC
  Filled 2023-05-06: qty 28.35

## 2023-05-06 MED ORDER — ONDANSETRON HCL 4 MG PO TABS: 8.0000 mg | ORAL_TABLET | Freq: Three times a day (TID) | ORAL | Status: DC | PRN

## 2023-05-06 MED ORDER — FENTANYL CITRATE (PF) 100 MCG/2ML IJ SOLN
INTRAMUSCULAR | Status: AC
  Filled 2023-05-06: qty 2

## 2023-05-06 MED ORDER — PROPOFOL 10 MG/ML IV BOLUS
INTRAVENOUS | Status: AC
  Filled 2023-05-06: qty 20

## 2023-05-06 MED ORDER — AMLODIPINE BESYLATE 5 MG PO TABS
5.0000 mg | ORAL_TABLET | Freq: Every day | ORAL | Status: DC
  Administered 2023-05-08: 5 mg via ORAL
  Filled 2023-05-06 (×2): qty 1

## 2023-05-06 MED ORDER — THROMBIN 5000 UNITS EX SOLR
CUTANEOUS | Status: AC
  Filled 2023-05-06: qty 5000

## 2023-05-06 MED ORDER — LOSARTAN POTASSIUM 50 MG PO TABS
50.0000 mg | ORAL_TABLET | Freq: Every day | ORAL | Status: DC
  Administered 2023-05-08: 50 mg via ORAL
  Filled 2023-05-06 (×2): qty 1

## 2023-05-06 MED ORDER — POTASSIUM CHLORIDE IN NACL 20-0.9 MEQ/L-% IV SOLN
INTRAVENOUS | Status: DC
  Filled 2023-05-06: qty 1000

## 2023-05-06 MED ORDER — EPHEDRINE SULFATE-NACL 50-0.9 MG/10ML-% IV SOSY
PREFILLED_SYRINGE | INTRAVENOUS | Status: DC | PRN
  Administered 2023-05-06: 5 mg via INTRAVENOUS

## 2023-05-06 MED ORDER — SENNA 8.6 MG PO TABS
1.0000 | ORAL_TABLET | Freq: Two times a day (BID) | ORAL | Status: DC
  Administered 2023-05-06 – 2023-05-08 (×2): 8.6 mg via ORAL
  Filled 2023-05-06 (×5): qty 1

## 2023-05-06 MED ORDER — SODIUM CHLORIDE 0.9 % IV SOLN: INTRAVENOUS | Status: DC

## 2023-05-06 MED ORDER — MIDAZOLAM HCL 2 MG/2ML IJ SOLN
INTRAMUSCULAR | Status: AC
  Filled 2023-05-06: qty 2

## 2023-05-06 MED ORDER — THROMBIN 20000 UNITS EX SOLR: CUTANEOUS | Status: DC | PRN

## 2023-05-06 MED ORDER — ROCURONIUM BROMIDE 10 MG/ML (PF) SYRINGE
PREFILLED_SYRINGE | INTRAVENOUS | Status: DC | PRN
  Administered 2023-05-06: 20 mg via INTRAVENOUS
  Administered 2023-05-06: 30 mg via INTRAVENOUS
  Administered 2023-05-06: 70 mg via INTRAVENOUS
  Administered 2023-05-06: 10 mg via INTRAVENOUS

## 2023-05-06 MED ORDER — CHLORHEXIDINE GLUCONATE 0.12 % MT SOLN: 15.0000 mL | Freq: Once | OROMUCOSAL | Status: AC

## 2023-05-06 MED ORDER — FENTANYL CITRATE (PF) 250 MCG/5ML IJ SOLN
INTRAMUSCULAR | Status: DC | PRN
  Administered 2023-05-06 (×2): 50 ug via INTRAVENOUS

## 2023-05-06 MED ORDER — ATORVASTATIN CALCIUM 10 MG PO TABS
10.0000 mg | ORAL_TABLET | Freq: Every day | ORAL | Status: DC
  Administered 2023-05-07 – 2023-05-08 (×2): 10 mg via ORAL
  Filled 2023-05-06 (×2): qty 1

## 2023-05-06 MED ORDER — ONDANSETRON HCL 4 MG/2ML IJ SOLN
INTRAMUSCULAR | Status: AC
  Filled 2023-05-06: qty 2

## 2023-05-06 MED ORDER — CHLORHEXIDINE GLUCONATE 0.12 % MT SOLN
OROMUCOSAL | Status: AC
  Administered 2023-05-06: 15 mL via OROMUCOSAL
  Filled 2023-05-06: qty 15

## 2023-05-06 MED ORDER — ACETAMINOPHEN 650 MG RE SUPP: 650.0000 mg | RECTAL | Status: DC | PRN

## 2023-05-06 MED ORDER — ONDANSETRON HCL 4 MG/2ML IJ SOLN
INTRAMUSCULAR | Status: DC | PRN
  Administered 2023-05-06: 4 mg via INTRAVENOUS

## 2023-05-06 MED ORDER — TRAZODONE HCL 50 MG PO TABS: 50.0000 mg | ORAL_TABLET | Freq: Every evening | ORAL | Status: DC | PRN

## 2023-05-06 MED ORDER — ACETAMINOPHEN 10 MG/ML IV SOLN
INTRAVENOUS | Status: DC | PRN
  Administered 2023-05-06: 1000 mg via INTRAVENOUS

## 2023-05-06 MED ORDER — LABETALOL HCL 5 MG/ML IV SOLN: 10.0000 mg | INTRAVENOUS | Status: DC | PRN

## 2023-05-06 MED ORDER — ACETAMINOPHEN 325 MG PO TABS
650.0000 mg | ORAL_TABLET | ORAL | Status: DC | PRN
  Administered 2023-05-07 (×3): 325 mg via ORAL
  Filled 2023-05-06 (×3): qty 2

## 2023-05-06 MED ORDER — DEXAMETHASONE 4 MG PO TABS
6.0000 mg | ORAL_TABLET | Freq: Four times a day (QID) | ORAL | Status: AC
  Administered 2023-05-06 – 2023-05-07 (×4): 6 mg via ORAL
  Filled 2023-05-06 (×4): qty 4

## 2023-05-06 MED ORDER — ORAL CARE MOUTH RINSE: 15.0000 mL | OROMUCOSAL | Status: DC | PRN

## 2023-05-06 MED ORDER — THROMBIN 20000 UNITS EX SOLR
CUTANEOUS | Status: AC
  Filled 2023-05-06: qty 20000

## 2023-05-06 MED ORDER — PHENYLEPHRINE HCL-NACL 20-0.9 MG/250ML-% IV SOLN
INTRAVENOUS | Status: DC | PRN
  Administered 2023-05-06: 20 ug/min via INTRAVENOUS

## 2023-05-06 MED ORDER — HEPARIN SODIUM (PORCINE) 5000 UNIT/ML IJ SOLN
5000.0000 [IU] | Freq: Three times a day (TID) | INTRAMUSCULAR | Status: DC
  Administered 2023-05-06 – 2023-05-08 (×7): 5000 [IU] via SUBCUTANEOUS
  Filled 2023-05-06 (×7): qty 1

## 2023-05-06 MED ORDER — LIDOCAINE 2% (20 MG/ML) 5 ML SYRINGE
INTRAMUSCULAR | Status: AC
  Filled 2023-05-06: qty 5

## 2023-05-06 MED ORDER — CEFAZOLIN SODIUM-DEXTROSE 2-3 GM-%(50ML) IV SOLR
INTRAVENOUS | Status: DC | PRN
Start: 2023-05-06 — End: 2023-05-06
  Administered 2023-05-06: 2 g via INTRAVENOUS

## 2023-05-06 MED ORDER — SODIUM CHLORIDE 0.9 % IV SOLN
0.1500 ug/kg/min | INTRAVENOUS | Status: DC
  Administered 2023-05-06: .1 ug/kg/min via INTRAVENOUS
  Filled 2023-05-06 (×3): qty 2000

## 2023-05-06 MED ORDER — ALBUMIN HUMAN 5 % IV SOLN: INTRAVENOUS | Status: DC | PRN

## 2023-05-06 MED ORDER — LABETALOL HCL 5 MG/ML IV SOLN
INTRAVENOUS | Status: DC | PRN
Start: 2023-05-06 — End: 2023-05-06
  Administered 2023-05-06: 5 mg via INTRAVENOUS

## 2023-05-06 MED ORDER — HYDROCODONE-ACETAMINOPHEN 5-325 MG PO TABS
1.0000 | ORAL_TABLET | ORAL | Status: DC | PRN
  Administered 2023-05-06 – 2023-05-07 (×4): 1 via ORAL
  Filled 2023-05-06 (×4): qty 1

## 2023-05-06 MED ORDER — NALOXONE HCL 0.4 MG/ML IJ SOLN: 0.0800 mg | INTRAMUSCULAR | Status: DC | PRN

## 2023-05-06 MED ORDER — PROPOFOL 10 MG/ML IV BOLUS
INTRAVENOUS | Status: DC | PRN
  Administered 2023-05-06: 50 mg via INTRAVENOUS
  Administered 2023-05-06: 150 mg via INTRAVENOUS

## 2023-05-06 MED ORDER — SUMATRIPTAN SUCCINATE 50 MG PO TABS: 50.0000 mg | ORAL_TABLET | ORAL | Status: DC | PRN

## 2023-05-06 MED ORDER — FENTANYL CITRATE (PF) 100 MCG/2ML IJ SOLN
25.0000 ug | INTRAMUSCULAR | Status: DC | PRN
  Administered 2023-05-06 (×4): 25 ug via INTRAVENOUS

## 2023-05-06 MED ORDER — 0.9 % SODIUM CHLORIDE (POUR BTL) OPTIME
TOPICAL | Status: DC | PRN
  Administered 2023-05-06: 1000 mL

## 2023-05-06 MED ORDER — CEFAZOLIN SODIUM 1 G IJ SOLR
INTRAMUSCULAR | Status: AC
  Filled 2023-05-06: qty 60

## 2023-05-06 MED ORDER — TOPIRAMATE 25 MG PO TABS
50.0000 mg | ORAL_TABLET | Freq: Two times a day (BID) | ORAL | Status: DC
  Administered 2023-05-06 – 2023-05-08 (×5): 50 mg via ORAL
  Filled 2023-05-06 (×5): qty 2

## 2023-05-06 MED ORDER — LIDOCAINE-EPINEPHRINE 0.5 %-1:200000 IJ SOLN
INTRAMUSCULAR | Status: DC | PRN
  Administered 2023-05-06: 10 mL

## 2023-05-06 MED ORDER — PANTOPRAZOLE SODIUM 40 MG IV SOLR
40.0000 mg | Freq: Every day | INTRAVENOUS | Status: DC
  Administered 2023-05-06: 40 mg via INTRAVENOUS
  Filled 2023-05-06: qty 10

## 2023-05-06 MED ORDER — MORPHINE SULFATE (PF) 2 MG/ML IV SOLN
1.0000 mg | INTRAVENOUS | Status: DC | PRN
  Administered 2023-05-07 (×2): 1 mg via INTRAVENOUS
  Filled 2023-05-06 (×2): qty 1

## 2023-05-06 MED ORDER — PROCHLORPERAZINE MALEATE 10 MG PO TABS: 10.0000 mg | ORAL_TABLET | Freq: Four times a day (QID) | ORAL | Status: DC | PRN

## 2023-05-06 MED ORDER — SODIUM CHLORIDE 0.9 % IV SOLN
INTRAVENOUS | Status: DC | PRN
Start: 2023-05-06 — End: 2023-05-06

## 2023-05-06 MED ORDER — LIDOCAINE 2% (20 MG/ML) 5 ML SYRINGE
INTRAMUSCULAR | Status: DC | PRN
  Administered 2023-05-06: 60 mg via INTRAVENOUS

## 2023-05-06 MED ORDER — LACOSAMIDE 50 MG PO TABS
50.0000 mg | ORAL_TABLET | Freq: Two times a day (BID) | ORAL | Status: DC
  Administered 2023-05-06 – 2023-05-08 (×5): 50 mg via ORAL
  Filled 2023-05-06 (×5): qty 1

## 2023-05-06 SURGICAL SUPPLY — 87 items
APL SKNCLS STERI-STRIP NONHPOA (GAUZE/BANDAGES/DRESSINGS)
BAG COUNTER SPONGE SURGICOUNT (BAG) ×2 IMPLANT
BAG SPNG CNTER NS LX DISP (BAG) ×2
BENZOIN TINCTURE PRP APPL 2/3 (GAUZE/BANDAGES/DRESSINGS) IMPLANT
BLADE CLIPPER SURG (BLADE) ×2 IMPLANT
BLADE SAW GIGLI 16 STRL (MISCELLANEOUS) IMPLANT
BLADE SURG 15 STRL LF DISP TIS (BLADE) IMPLANT
BLADE SURG 15 STRL SS (BLADE)
BLADE ULTRA TIP 2M (BLADE) IMPLANT
BNDG CMPR 75X41 PLY HI ABS (GAUZE/BANDAGES/DRESSINGS) ×2
BNDG GAUZE DERMACEA FLUFF 4 (GAUZE/BANDAGES/DRESSINGS) IMPLANT
BNDG GZE DERMACEA 4 6PLY (GAUZE/BANDAGES/DRESSINGS) ×2
BNDG STRETCH 4X75 STRL LF (GAUZE/BANDAGES/DRESSINGS) IMPLANT
BUR ACORN 6.0 PRECISION (BURR) ×2 IMPLANT
BUR MATCHSTICK NEURO 3.0 LAGG (BURR) IMPLANT
BUR SPIRAL ROUTER 2.3 (BUR) IMPLANT
CANISTER SUCT 3000ML PPV (MISCELLANEOUS) ×2 IMPLANT
CASSETTE SUCT IRRIG SONOPET IQ (MISCELLANEOUS) IMPLANT
CATH VENTRIC 35X38 W/TROCAR LG (CATHETERS) IMPLANT
CLIP TI MEDIUM 6 (CLIP) IMPLANT
CNTNR URN SCR LID CUP LEK RST (MISCELLANEOUS) ×2 IMPLANT
CONT SPEC 4OZ STRL OR WHT (MISCELLANEOUS) ×2
COVERAGE SUPPORT O-ARM STEALTH (MISCELLANEOUS) ×2 IMPLANT
DRAIN RELI 100 BL SUC LF ST (DRAIN)
DRAIN SUBARACHNOID (WOUND CARE) IMPLANT
DRAPE CAMERA VIDEO/LASER (DRAPES) IMPLANT
DRAPE MICROSCOPE SLANT 54X150 (MISCELLANEOUS) IMPLANT
DRAPE NEUROLOGICAL W/INCISE (DRAPES) ×2 IMPLANT
DRAPE ORTHO SPLIT 77X108 STRL (DRAPES)
DRAPE SURG 17X23 STRL (DRAPES) IMPLANT
DRAPE SURG ORHT 6 SPLT 77X108 (DRAPES) IMPLANT
DRAPE WARM FLUID 44X44 (DRAPES) ×2 IMPLANT
DURAPREP 6ML APPLICATOR 50/CS (WOUND CARE) ×2 IMPLANT
ELECT REM PT RETURN 9FT ADLT (ELECTROSURGICAL) ×2 IMPLANT
ELECTRODE REM PT RTRN 9FT ADLT (ELECTROSURGICAL) ×2 IMPLANT
EVACUATOR 1/8 PVC DRAIN (DRAIN) IMPLANT
EVACUATOR SILICONE 100CC (DRAIN) IMPLANT
FEE COVERAGE SUPPORT O-ARM (MISCELLANEOUS) ×2 IMPLANT
FORCEPS BIPOLAR SPETZLER 8 1.0 (NEUROSURGERY SUPPLIES) IMPLANT
GAUZE 4X4 16PLY ~~LOC~~+RFID DBL (SPONGE) IMPLANT
GAUZE SPONGE 4X4 12PLY STRL (GAUZE/BANDAGES/DRESSINGS) ×2 IMPLANT
GAUZE SPONGE 4X4 12PLY STRL LF (GAUZE/BANDAGES/DRESSINGS) IMPLANT
GLOVE ECLIPSE 6.5 STRL STRAW (GLOVE) ×4 IMPLANT
GLOVE EXAM NITRILE XL STR (GLOVE) IMPLANT
GOWN STRL REUS W/ TWL LRG LVL3 (GOWN DISPOSABLE) ×4 IMPLANT
GOWN STRL REUS W/ TWL XL LVL3 (GOWN DISPOSABLE) IMPLANT
GOWN STRL REUS W/TWL 2XL LVL3 (GOWN DISPOSABLE) IMPLANT
GOWN STRL REUS W/TWL LRG LVL3 (GOWN DISPOSABLE) ×4
GOWN STRL REUS W/TWL XL LVL3 (GOWN DISPOSABLE)
HEMOSTAT SURGICEL 2X14 (HEMOSTASIS) IMPLANT
HOOK DURA 1/2IN (MISCELLANEOUS) ×2 IMPLANT
KIT BASIN OR (CUSTOM PROCEDURE TRAY) ×2 IMPLANT
KIT DRAIN CSF ACCUDRAIN (MISCELLANEOUS) IMPLANT
KIT TURNOVER KIT B (KITS) ×2 IMPLANT
MARKER SPHERE PSV REFLC 13MM (MARKER) ×6 IMPLANT
NDL HYPO 25X1 1.5 SAFETY (NEEDLE) ×2 IMPLANT
NDL SPNL 18GX3.5 QUINCKE PK (NEEDLE) IMPLANT
NEEDLE HYPO 25X1 1.5 SAFETY (NEEDLE) ×2 IMPLANT
NEEDLE SPNL 18GX3.5 QUINCKE PK (NEEDLE) IMPLANT
NS IRRIG 1000ML POUR BTL (IV SOLUTION) ×6 IMPLANT
PACK CRANIOTOMY CUSTOM (CUSTOM PROCEDURE TRAY) ×2 IMPLANT
PATTIES SURGICAL .25X.25 (GAUZE/BANDAGES/DRESSINGS) IMPLANT
PATTIES SURGICAL .5 X.5 (GAUZE/BANDAGES/DRESSINGS) IMPLANT
PATTIES SURGICAL .5 X3 (DISPOSABLE) IMPLANT
PATTIES SURGICAL 1/4 X 3 (GAUZE/BANDAGES/DRESSINGS) IMPLANT
PATTIES SURGICAL 1X1 (DISPOSABLE) IMPLANT
SCREW UNIII AXS SD 1.5X4 (Screw) IMPLANT
SPECIMEN JAR SMALL (MISCELLANEOUS) IMPLANT
SPIKE FLUID TRANSFER (MISCELLANEOUS) ×2 IMPLANT
SPONGE NEURO XRAY DETECT 1X3 (DISPOSABLE) IMPLANT
SPONGE SURGIFOAM ABS GEL 100 (HEMOSTASIS) ×2 IMPLANT
STAPLER VISISTAT 35W (STAPLE) ×2 IMPLANT
SUT ETHILON 3 0 FSL (SUTURE) IMPLANT
SUT ETHILON 3 0 PS 1 (SUTURE) IMPLANT
SUT NURALON 4 0 TR CR/8 (SUTURE) ×6 IMPLANT
SUT SILK 0 TIES 10X30 (SUTURE) IMPLANT
SUT STEEL 0 (SUTURE)
SUT STEEL 0 18XMFL TIE 17 (SUTURE) IMPLANT
SUT VIC AB 2-0 CT2 18 VCP726D (SUTURE) ×4 IMPLANT
TIP TISSUE SONOPET IQ STD 12 (TIP) IMPLANT
TOWEL GREEN STERILE (TOWEL DISPOSABLE) ×2 IMPLANT
TOWEL GREEN STERILE FF (TOWEL DISPOSABLE) ×2 IMPLANT
TRAY FOLEY MTR SLVR 16FR STAT (SET/KITS/TRAYS/PACK) ×2 IMPLANT
TUBE CONNECTING 12X1/4 (SUCTIONS) ×2 IMPLANT
TUBING FEATHERFLOW (TUBING) IMPLANT
UNDERPAD 30X36 HEAVY ABSORB (UNDERPADS AND DIAPERS) ×2 IMPLANT
WATER STERILE IRR 1000ML POUR (IV SOLUTION) ×2 IMPLANT

## 2023-05-06 NOTE — Anesthesia Postprocedure Evaluation (Signed)
Anesthesia Post Note  Patient: Jennifer [name]  Procedure(s) Performed: REDO LEFT CRANIOTOMY FOR TUMOR WITH STEALTH (Left: Head) APPLICATION OF CRANIAL NAVIGATION (Left)     Patient location during evaluation: PACU Anesthesia Type: General Level of consciousness: awake and alert Pain management: pain level controlled Vital Signs Assessment: post-procedure vital signs reviewed and stable Respiratory status: spontaneous breathing, nonlabored ventilation, respiratory function stable and patient connected to nasal cannula oxygen Cardiovascular status: blood pressure returned to baseline and stable Postop Assessment: no apparent nausea or vomiting Anesthetic complications: no  There were no known notable events for this encounter.  Last Vitals:  Vitals:   05/06/23 2030 05/06/23 2045  BP: (!) 142/70 134/82  Pulse: 87 85  Resp: 20 17  Temp: 36.9 C   SpO2: 99% 98%    Last Pain:  Vitals:   05/06/23 2045  PainSc: 4                  Reagen Haberman,W. EDMOND

## 2023-05-06 NOTE — Progress Notes (Addendum)
MD at bedside.  This RN notified MD that the cuff pressure is reading 155/84.  A Line reading 180's systolic  but positional. MD ok with using cuff pressures on patient.  Will continue to monitor and notify for further changes.   MD wants 150-160's systolic.

## 2023-05-06 NOTE — Anesthesia Procedure Notes (Signed)
Arterial Line Insertion Start/End9/06/2023 4:00 PM, 05/06/2023 4:05 PM Performed by: Lowella Curb, MD, anesthesiologist  Patient location: Pre-op. Preanesthetic checklist: patient identified, IV checked, site marked, risks and benefits discussed, surgical consent, monitors and equipment checked, pre-op evaluation, timeout performed and anesthesia consent Lidocaine 1% used for infiltration Left, radial was placed Catheter size: 20 G Hand hygiene performed  and maximum sterile barriers used   Attempts: 1 Procedure performed without using ultrasound guided technique. Following insertion, dressing applied. Post procedure assessment: normal and unchanged  Post procedure complications: unsuccessful attempts. Patient tolerated the procedure well with no immediate complications.

## 2023-05-06 NOTE — Interval H&P Note (Signed)
History and Physical Interval Note:  05/06/2023 2:39 PM  Jennifer [name]  has presented today for surgery, with the diagnosis of Brain Tumor.  The various methods of treatment have been discussed with the patient and family. After consideration of risks, benefits and other options for treatment, the patient has consented to  Procedure(s): Redo Craniotomy for Tumor w/Stealth (Left) APPLICATION OF CRANIAL NAVIGATION (Left) as a surgical intervention.  The patient's history has been reviewed, patient examined, no change in status, stable for surgery.  I have reviewed the patient's chart and labs.  Questions were answered to the patient's satisfaction.   No Known Allergies Past Medical History:  Diagnosis Date   Brain cancer (HCC)    Fibromyalgia    Hyperlipidemia    Hypertension    Migraine    Seizures (HCC)    Past Surgical History:  Procedure Laterality Date   APPLICATION OF CRANIAL NAVIGATION N/A 04/11/2022   Procedure: APPLICATION OF CRANIAL NAVIGATION;  Surgeon: Coletta Memos, MD;  Location: [facility] OR;  Service: Neurosurgery;  Laterality: N/A;   COLONOSCOPY     CRANIOTOMY Left 04/11/2022   Procedure: LEFT FRONTAL CRANIOTOMY FOR RESECTION OF TUMOR WITH [name];  Surgeon: Coletta Memos, MD;  Location: [facility] OR;  Service: Neurosurgery;  Laterality: Left;  RM 21   LAPAROSCOPIC APPENDECTOMY  07/15/2012   Procedure: APPENDECTOMY LAPAROSCOPIC;  Surgeon: Liz Malady, MD;  Location: [facility] OR;  Service: General;  Laterality: N/A;  Laparoscopic Appendectomy   Family History  Adopted: Yes   Social History   Socioeconomic History   Marital status: Widowed    Spouse name: Not on file   Number of children: 2   Years of education: Not on file   Highest education level: Not on file  Occupational History   Occupation: retail    Comment: [employer] in [place], [place]  Tobacco Use   Smoking status: Never   Smokeless tobacco: Never  Vaping Use   Vaping status: Never Used  Substance and Sexual  Activity   Alcohol use: Not Currently    Comment: 2 drinks once every 2-3 months   Drug use: No   Sexual activity: Not Currently    Birth control/protection: Post-menopausal  Other Topics Concern   Not on file  Social History Narrative   Marital status: widowed since 2008; husband was bipolar and alcoholic      Children: 2 children (28, 33); son lives with patient.Daughter lives in [place]/autism/does not drive.  Daughter lives with mother-in-law.  One grandpuppy      Lives: with son      Employment: works at [employer]; also works in Corporate investment banker business.      Tobacco: none      Alcohol: none; husband was an alcoholic.      Drugs:   none      Exercise:   Social Determinants of Health   Financial Resource Strain: Low Risk  (02/26/2022)   Received from [facility]   Overall Financial Resource Strain (CARDIA)  Food Insecurity: No Food Insecurity (02/26/2022)   Received from [facility]   Hunger Vital Sign  Transportation Needs: No Transportation Needs (02/26/2022)   Received from [facility] - Transportation  Physical Activity: Insufficiently Active (02/26/2022)   Received from [facility]   Exercise Vital Sign  Stress: Stress Concern Present (02/26/2022)   Received from [facility]   [employer] [facility] Occupational [facility] - Occupational Stress Questionnaire  Social Connections: Socially Isolated (02/26/2022)   Received from [facility]   Social Connection and Isolation Panel [NHANES]  Intimate Partner Violence: Not At Risk (02/26/2022)   Received from [facility] visits prior to 10/25/2022., [facility] visits prior to 10/25/2022.   Humiliation, Afraid, Rape, and Kick questionnaire    Fear of Current or Ex-Partner: No    Emotionally Abused: No    Physically Abused: No    Sexually Abused: No   MR BRAIN W WO CONTRAST  Result Date: 05/04/2023 CLINICAL DATA:  Follow-up frontal lobe glioblastoma. Assess treatment  response. EXAM: MRI HEAD WITHOUT AND WITH CONTRAST TECHNIQUE: Multiplanar, multiecho pulse sequences of the brain and surrounding structures were obtained without and with intravenous contrast. CONTRAST:  8mL GADAVIST GADOBUTROL 1 MMOL/ML IV SOLN COMPARISON:  04/23/2023.  03/26/2023.  01/22/2023. FINDINGS: Brain: Since the immediate prior exam, there is slight further increase in tumor enhancement at the posterior frontal vertex. Precise measurement is difficult because of the irregular nature and difference in slice position but the volume of enhancing tissue may be increased 10% since the immediate prior exam. Mass-effect and vasogenic edema throughout the region or similar, with left-to-right shift of 3 mm in the midline. No new satellite areas of enhancement are evident. No posterior fossa abnormality. Ventricular size is stable. Vascular: Major vessels at the base of the brain show flow. Skull and upper cervical spine: Craniotomy findings on the left. Otherwise negative. Sinuses/Orbits: Clear/normal Other: None IMPRESSION: Since the immediate prior study, there is slight further increase in tumor enhancement at the posterior frontal vertex. Precise measurement is difficult but the volume of enhancing tissue may be 10% increased since the immediate prior exam. Mass-effect and vasogenic edema are similar, with left-to-right shift of 3 mm in the midline. No new satellite areas of enhancement. Electronically Signed   By: Paulina Fusi M.D.   On: 05/04/2023 14:40   Overnight EEG with video  Result Date: 04/25/2023 Charlsie Quest, MD     04/26/2023  9:24 AM ,[name] MRN: [id number] Epilepsy Attending: Charlsie Quest Referring Physician/Provider: Erick Blinks, MD Duration: 04/24/2023 1455 to 04/25/2023 1455 Patient history: 67 y.o. female with PMH significant for with PMH significant for HLD, HTN, migraine, GBM s/p resection who presents with R sided weakness that has progressively  worsened over the last 10 days, much more worse over last 3 days. Her neurologic examination is notable for RUE plegic and RLE weakness. EEG to evaluate for seizure Level of alertness: Awake, asleep AEDs during EEG study: TPM Technical aspects: This EEG study was done with scalp electrodes positioned according to the 10-20 International system of electrode placement. Electrical activity was reviewed with band pass filter of 1-70Hz , sensitivity of 7 uV/mm, display speed of 61mm/sec with a 60Hz  notched filter applied as appropriate. EEG data were recorded continuously and digitally stored.  Video monitoring was available and reviewed as appropriate. Description: The posterior dominant rhythm consists of 8 Hz activity of moderate voltage (25-35 uV) seen predominantly in posterior head regions, asymmetric ( left<right)  and reactive to eye opening and eye closing.  Sleep was characterized by vertex waves, sleep spindles (12 to 14 Hz), maximal frontocentral region. EEG showed continuous polymorphic 3 to 6 Hz theta-delta slowing in left hemisphere with overriding sharply contoured activity in left centro-temporal region consistent with breach artifact. Hyperventilation and photic stimulation were not performed.   ABNORMALITY - Continuous slow, left hemisphere - Breach artifact, left centro-temporal region - Background asymmetry, left<right IMPRESSION: This study is  suggestive of cortical dysfunction arising from left hemisphere likely secondary to underlying tumor. Additionally there is cortical dysfunction in left centro-temporal region consistent with prior craniotomy. No seizures or definite epileptiform discharges were seen throughout the recording. Charlsie Quest   MR BRAIN W WO CONTRAST  Result Date: 04/23/2023 CLINICAL DATA:  Brain/CNS neoplasm, monitor worsening weakness EXAM: MRI HEAD WITHOUT AND WITH CONTRAST TECHNIQUE: Multiplanar, multiecho pulse sequences of the brain and surrounding structures were  obtained without and with intravenous contrast. CONTRAST:  7mL GADAVIST GADOBUTROL 1 MMOL/ML IV SOLN COMPARISON:  Brain MR 03/26/23 FINDINGS: Brain: Negative for an acute infarct. No hemorrhage. No hydrocephalus. No extra-axial fluid collection. Compared to prior exam there is new 3 mm rightward midline shift. Postsurgical changes from a left frontal craniotomy with a subjacent resection cavity redemonstrated. Compared to prior exam there is marked interval increase in the degree of T2/FLAIR hyperintense signal abnormality surrounding the contrast-enhanced tumor in the left frontal lobe. T2/FLAIR hyperintense signal abnormality now extends posteriorly to the level of the postcentral gyrus. T2/FLAIR hyperintense signal abnormality also extends to involve a large portion of the opercular region. The contrast-enhancing portion of the tumor has also increased in size, best seen on the sagittal post contrast-enhanced T1 weighted see series, now measuring up to 4.8 x 2.8 cm, previously 4.1 x 2.4 cm (series 20, image 15). On the axial post contrast-enhanced sequence the increased in size is best seen along the posterior most aspect, where the contrast-enhancing tumor now measures 2.0 x 1.8 cm, previously 1.3 x 0.8 cm. Vascular: Normal flow voids. Skull and upper cervical spine: Normal marrow signal. Sinuses/Orbits: No middle ear or mastoid effusion. Paranasal sinuses are clear. Orbits are unremarkable. Other: None. IMPRESSION: Interval increase in size of contrast-enhancing tumor centered in the left frontal lobe, as well as the surrounding T2/FLAIR hyperintense signal abnormality which now extends posteriorly to the level of the postcentral gyrus. There is also new 3 mm rightward midline shift. Electronically Signed   By: Lorenza Cambridge M.D.   On: 04/23/2023 19:03   CT Head Wo Contrast  Result Date: 04/23/2023 CLINICAL DATA:  Left-sided weakness, history of glioblastoma EXAM: CT HEAD WITHOUT CONTRAST TECHNIQUE:  Contiguous axial images were obtained from the base of the skull through the vertex without intravenous contrast. RADIATION DOSE REDUCTION: This exam was performed according to the departmental dose-optimization program which includes automated exposure control, adjustment of the mA and/or kV according to patient size and/or use of iterative reconstruction technique. COMPARISON:  04/12/2023 FINDINGS: Brain: Vasogenic edema of the bilateral frontal lobes is again identified, left greater than right, slightly more pronounced than prior study. The tumor margins within the left frontal lobe seen on prior MRI are difficult to delineate on this unenhanced CT. No evidence of acute infarct or hemorrhage. Lateral ventricles and remaining midline structures are unremarkable. No acute extra-axial fluid collections. No mass effect. Vascular: No hyperdense vessel or unexpected calcification. Skull: Prior left frontal craniotomy.  No acute bony abnormality. Sinuses/Orbits: No acute finding. Other: None. IMPRESSION: 1. Continued vasogenic edema within the bilateral frontal lobes, left greater than right, slightly more pronounced than prior study. Tumor margins within the left frontal lobe are poorly delineated on unenhanced CT. 2. No acute infarct or hemorrhage. Electronically Signed   By: Sharlet Salina M.D.   On: 04/23/2023 15:32   CT HEAD WO CONTRAST ( )  Result Date: 04/12/2023 CLINICAL DATA:  Trauma. History of glioblastoma of the frontal lobe. EXAM: CT HEAD WITHOUT CONTRAST TECHNIQUE: Contiguous  axial images were obtained from the base of the skull through the vertex without intravenous contrast. RADIATION DOSE REDUCTION: This exam was performed according to the departmental dose-optimization program which includes automated exposure control, adjustment of the mA and/or kV according to patient size and/or use of iterative reconstruction technique. COMPARISON:  Head CT 02/25/2023.  MRI head 03/26/2023. FINDINGS: Brain:  Vasogenic edema in the bilateral frontal lobes, left greater than right appear similar to MRI given differences in technique. Left frontal lobe tumor margins are not well delineated on this noncontrast study. There is no acute intracranial hemorrhage, mass effect or midline shift. There is no hydrocephalus. Brain volume is age-appropriate. Vascular: No hyperdense vessel or unexpected calcification. Skull: No acute fractures.  Left frontal craniotomy again seen. Sinuses/Orbits: No acute finding. Other: None. IMPRESSION: 1. No acute intracranial hemorrhage or calvarial fracture. 2. Vasogenic edema in the bilateral frontal lobes appears similar to MRI given differences in technique. Left frontal lobe tumor margins are not well delineated on this noncontrast study. Electronically Signed   By: Darliss Cheney M.D.   On: 04/12/2023 20:13   Alert and oriented x 4, speech is halting, word finding difficulties Perrl, full eom Tongue and uvula midline Normal strength on left, plegic on right side Will move all extremities OR for redo craniotomy due to recurrent Glioblastoma Multiforme. Will perform with navigation, and gliadel.  Risks including but not limited to bleeding, infection, stroke, coma, death, paralysis, inability to remove tumor, brain damage, speech difficulties , greater weakness, and other risks were discussed and she wishes to proceed.   Coletta Memos

## 2023-05-06 NOTE — Transfer of Care (Signed)
Immediate Anesthesia Transfer of Care Note  Patient: Jennifer [name]  Procedure(s) Performed: REDO LEFT CRANIOTOMY FOR TUMOR WITH STEALTH (Left: Head) APPLICATION OF CRANIAL NAVIGATION (Left)  Patient Location: PACU  Anesthesia Type:General  Level of Consciousness: awake and alert   Airway & Oxygen Therapy: Patient connected to nasal cannula oxygen  Post-op Assessment: Report given to RN, Post -op Vital signs reviewed and stable, and Patient moving all extremities X 4  Post vital signs: Reviewed and stable  Last Vitals:  Vitals Value Taken Time  BP 171/81   Temp    Pulse 92 05/06/23 1906  Resp 12   SpO2 96 % 05/06/23 1906  Vitals shown include unfiled device data.  Last Pain:  Vitals:   05/06/23 1335  PainSc: 0-No pain      Patients Stated Pain Goal: 0 (05/06/23 1335)  Complications: There were no known notable events for this encounter.

## 2023-05-06 NOTE — Anesthesia Procedure Notes (Addendum)
Procedure Name: Intubation Date/Time: 05/06/2023 3:56 PM  Performed by: Sharyn Dross, CRNAPre-anesthesia Checklist: Patient identified, Emergency Drugs available, Suction available and Patient being monitored Patient Re-evaluated:Patient Re-evaluated prior to induction Oxygen Delivery Method: Circle system utilized Preoxygenation: Pre-oxygenation with 100% oxygen Induction Type: IV induction Ventilation: Mask ventilation without difficulty and Oral airway inserted - appropriate to patient size Laryngoscope Size: Mac and 4 Grade View: Grade I Tube type: Oral Tube size: 7.0 mm Number of attempts: 1 Airway Equipment and Method: Stylet and Oral airway Placement Confirmation: ETT inserted through vocal cords under direct vision, positive ETCO2 and breath sounds checked- equal and bilateral Secured at: 22 cm Tube secured with: Tape Dental Injury: Teeth and Oropharynx as per pre-operative assessment  Comments: Intubation performed by Dorna Leitz.

## 2023-05-06 NOTE — Op Note (Signed)
05/06/2023  7:21 PM  PATIENT:  Jennifer [name]  67 y.o. 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 It was felt a re resection was in order considering her very good status overall. PRE-OPERATIVE DIAGNOSIS:  Brain Tumor  POST-OPERATIVE DIAGNOSIS:  Brain Tumor  PROCEDURE:  Procedure(s): REDO LEFT CRANIOTOMY FOR TUMOR WITH STEALTH APPLICATION OF CRANIAL NAVIGATION Placement of gliadel wafers, 5 SURGEON: Surgeon(s): Coletta Memos, MD  ASSISTANTS:none  ANESTHESIA:   general  EBL:  No intake/output data recorded.  BLOOD ADMINISTERED:none  CELL SAVER GIVEN:not used  COUNT:per nursing  DRAINS: Urinary Catheter (Foley)   SPECIMEN:  Source of Specimen:  left frontal lobe  DICTATION: Jennifer [name] was taken to the operating room, intubated, and placed under a general anesthetic without difficulty. She was positioned supine on the OR bed. Once adequate anesthesia was obtained I placed her head in a three pin [place] head holder. I secured the head holder to the bed with an adapter. I placed the stealth navigation attachment to the head holder. I localized her head to the stealth platform. With preoperative planning, a trajectory was chosen and the adequacy of the existing incision and craniotomy was deemed sufficient to remove the tumor. I prepped the scalp in a sterile manner. I draped her in a sterile fashion. I opened the incision with a 10 blade and reflected the scalp rostrally. I used fish hooks to hold it the flap in place. I exposed the craniotomy flap, removed the skull side screws from the plates, then removed the bone flap. I used navigation to check location and adequacy of the flap and remained satisfied with my exposure.  I opened the dura and exposed the brain. With navigation I identified the main bulk of tumor and used the cavitron to remove it. I achieved what I thought were tumor free borders, though undoubtedly were not given the pathology of  the tumor. I did send samples to the pathologist. I placed 5 gliadel wafers in the resection bed. I approximated the dura with suture. I secured the bone flap with new skull side screws and hole locations. I closed the scalp in layers just in the region of the temporalis, and in a single layer elsewhere as the scalp was exceedingly thin. I applied a sterile dressing. She was extubated and taken to the PACU.    PLAN OF CARE: Admit to inpatient   PATIENT DISPOSITION:  PACU - hemodynamically stable.   Delay start of Pharmacological VTE agent (>24hrs) due to surgical blood loss or risk of bleeding:  no

## 2023-05-06 NOTE — Progress Notes (Signed)
Updated [facility] patient having occasional PVC's.  No new orders.

## 2023-05-06 NOTE — Anesthesia Preprocedure Evaluation (Signed)
Anesthesia Evaluation  Patient identified by MRN, date of birth, ID band Patient awake    Reviewed: Allergy & Precautions, NPO status , Patient's Chart, lab work & pertinent test results  Airway Mallampati: II  TM Distance: >3 FB Neck ROM: Full    Dental  (+) Dental Advisory Given, Teeth Intact   Pulmonary neg pulmonary ROS   Pulmonary exam normal breath sounds clear to auscultation       Cardiovascular hypertension, Pt. on medications  Rhythm:Regular Rate:Normal     Neuro/Psych  Headaches, Seizures -,  Left frontal brain tumor  negative psych ROS   GI/Hepatic negative GI ROS, Neg liver ROS,,,  Endo/Other  negative endocrine ROS    Renal/GU negative Renal ROS  negative genitourinary   Musculoskeletal  (+)  Fibromyalgia -  Abdominal   Peds  Hematology negative hematology ROS (+)   Anesthesia Other Findings   Reproductive/Obstetrics                              Anesthesia Physical Anesthesia Plan  ASA: 3  Anesthesia Plan: General   Post-op Pain Management: Ofirmev IV (intra-op)*   Induction: Intravenous  PONV Risk Score and Plan: 3 and Ondansetron, Dexamethasone, Midazolam and Treatment may vary due to age or medical condition  Airway Management Planned: Oral ETT  Additional Equipment: Arterial line  Intra-op Plan:   Post-operative Plan: Possible Post-op intubation/ventilation  Informed Consent: I have reviewed the patients History and Physical, chart, labs and discussed the procedure including the risks, benefits and alternatives for the proposed anesthesia with the patient or authorized representative who has indicated his/her understanding and acceptance.     Dental advisory given  Plan Discussed with: CRNA  Anesthesia Plan Comments: (Remi gtt 2 x PIV)         Anesthesia Quick Evaluation

## 2023-05-07 ENCOUNTER — Encounter (HOSPITAL_COMMUNITY): Payer: Self-pay | Admitting: Neurosurgery

## 2023-05-07 ENCOUNTER — Inpatient Hospital Stay (HOSPITAL_COMMUNITY): Payer: Medicare Other

## 2023-05-07 MED ORDER — GADOBUTROL 1 MMOL/ML IV SOLN
7.0000 mL | Freq: Once | INTRAVENOUS | Status: AC | PRN
  Administered 2023-05-07: 7 mL via INTRAVENOUS

## 2023-05-07 MED ORDER — PANTOPRAZOLE SODIUM 40 MG PO TBEC
40.0000 mg | DELAYED_RELEASE_TABLET | Freq: Every day | ORAL | Status: DC
  Administered 2023-05-07 – 2023-05-08 (×2): 40 mg via ORAL
  Filled 2023-05-07 (×2): qty 1

## 2023-05-07 NOTE — Evaluation (Signed)
Speech Language Pathology Evaluation Patient Details Name: Jennifer [name] MRN: [id number] DOB: [birth year] Today's Date: 05/07/2023 Time: 0865-7846 SLP Time Calculation (min) (ACUTE ONLY): 21 min  Problem List:  Patient Active Problem List   Diagnosis Date Noted   Status post craniotomy 05/06/2023   Brain tumor, glioma (HCC) 05/06/2023   Acute right hemiparesis (HCC) 04/23/2023   Left-sided weakness 04/23/2023   Seizure disorder (HCC) 04/23/2023   Migraine 04/23/2023   Focal seizures (HCC) 03/31/2023   Glioblastoma multiforme of frontal lobe (HCC) 04/09/2022   Essential hypertension 06/13/2015   Bilateral chronic knee pain 10/14/2013   Chronic bilateral low back pain without sciatica 11/01/2012   S/P laparoscopic appendectomy 07/28/2012   Migraines 09/24/2011   Hyperlipidemia 09/24/2011   Past Medical History:  Past Medical History:  Diagnosis Date   Brain cancer (HCC)    Fibromyalgia    Hyperlipidemia    Hypertension    Migraine    Seizures (HCC)    Past Surgical History:  Past Surgical History:  Procedure Laterality Date   APPLICATION OF CRANIAL NAVIGATION N/A 04/11/2022   Procedure: APPLICATION OF CRANIAL NAVIGATION;  Surgeon: Coletta Memos, MD;  Location: [facility] OR;  Service: Neurosurgery;  Laterality: N/A;   COLONOSCOPY     CRANIOTOMY Left 04/11/2022   Procedure: LEFT FRONTAL CRANIOTOMY FOR RESECTION OF TUMOR WITH [name];  Surgeon: Coletta Memos, MD;  Location: [facility] OR;  Service: Neurosurgery;  Laterality: Left;  RM 21   LAPAROSCOPIC APPENDECTOMY  07/15/2012   Procedure: APPENDECTOMY LAPAROSCOPIC;  Surgeon: Liz Malady, MD;  Location: [facility] OR;  Service: General;  Laterality: N/A;  Laparoscopic Appendectomy   HPI:  Jennifer [name] is a 67 yo female admitted 9/11 for resection of recurrent Glioblastoma Multiforme in L frontal lobe. Recently admitted 8/29-9/3 with progressive R sided weakness after seizure 8/18. Seen by SLP 8/30 with score of 17/30 on the SLUMS  characterized by deficits related to problem solving, awareness, and memory. Pt was seen by OP SLP following resection of brain tumor 03/2022 with goals targeting expressive and receptive aphasia, pragmatics (pseudobulbar affect and perseveration), as well as executive functioning (increased processing time). PMH includes 03/2022 glioblastoma multiforme resection, HTN, HLD, migraine, fibromyalgia, seizure   Assessment / Plan / Recommendation Clinical Impression  No family present at the time of this evaluation to confirm pt's baseline, although she endorses acute changes with cognitive linguistic function that she is unable to elaborate on due to expressive aphasia. She reports she lives with her daughter who provides assistance. Suspect pt is experiecing acute on chronic cognitive changes as she was unable to complete the SLUMS today and previously scored 17/30 during prior admission 8/30. Portions of the SLUMS and Bedside Western Aphasia Battery were administered today with noted deficits related to memory, awareness, problem solving, and auditory comprehension. Pt 100% accurate with confrontation naming task, although was only able to name one animal in a one minute divergent naming activity. 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 Pt frequently perseverative throughout session, requiring cueing to return to target task. If presentation this date is representative of acute on chronic cognitive deficits, feel that she may benefit from ongoing [facility] SLP upon d/c to ease caregiver burden and provide compensatory strategies for cognitive linguistic function. Will continue to follow.    SLP Assessment  SLP Recommendation/Assessment: Patient needs continued Speech Lanaguage Pathology Services SLP Visit Diagnosis: Cognitive communication deficit (R41.841)  Recommendations for  follow up therapy are one component of a multi-disciplinary discharge planning process, led by the attending physician.  Recommendations may be updated based on patient status, additional functional criteria and insurance authorization.    Follow Up Recommendations  Home health SLP    Assistance Recommended at Discharge  Frequent or constant Supervision/Assistance  Functional Status Assessment Patient has had a recent decline in their functional status and demonstrates the ability to make significant improvements in function in a reasonable and predictable amount of time.  Frequency and Duration min 1 x/week  1 week      SLP Evaluation Cognition  Overall Cognitive Status: History of cognitive impairments - at baseline Arousal/Alertness: Awake/alert Orientation Level: Oriented X4 Attention: Sustained Sustained Attention: Appears intact Memory: Impaired Memory Impairment: Storage deficit;Retrieval deficit Awareness: Impaired Awareness Impairment: Intellectual impairment Problem Solving: Impaired Problem Solving Impairment: Verbal basic       Comprehension  Auditory Comprehension Overall Auditory Comprehension: Impaired Yes/No Questions: Impaired Complex Questions: 50-74% accurate Commands: Impaired Two Step Basic Commands: 50-74% accurate Multistep Basic Commands: 25-49% accurate    Expression Expression Primary Mode of Expression: Verbal Verbal Expression Overall Verbal Expression: Impaired Repetition: No impairment Naming: Impairment Confrontation: Within functional limits Divergent: 0-24% accurate Pragmatics: Impairment Impairments: Other (comment) (large emotional responses, laughs inappropriately, child-like affect) Interfering Components: Premorbid deficit Written Expression Dominant Hand: Right   Oral / Motor  Oral Motor/Sensory Function Overall Oral Motor/Sensory Function: Within functional limits Motor Speech Overall Motor Speech: Appears within functional  limits for tasks assessed            Gwynneth Aliment, M.A., CF-SLP Speech Language Pathology, Acute Rehabilitation Services  Secure Chat preferred [phone]  05/07/2023, 5:08 PM

## 2023-05-07 NOTE — Progress Notes (Signed)
Patient ID: Jennifer [name], female   DOB: [birth year], 67 y.o.   MRN: [id number] BP 128/68   Pulse 66   Temp (!) 97.3 F (36.3 C) (Oral)   Resp 18   Ht 5\' 3"  (1.6 m)   Wt 75.3 kg   SpO2 99%   BMI 29.41 kg/m  Alert and oriented x 4 Speech is clear, non fluent Following all commands MRI shows most of enhancing tissue removed. Doing well post op

## 2023-05-07 NOTE — Evaluation (Signed)
Physical Therapy Evaluation Patient Details Name: Jennifer [name] MRN: [id number] DOB: 04-Apr-1956 Today's Date: 05/07/2023  History of Present Illness  67 y.o. 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 PMhx: admission 04/23/23 for progressive right sided weakness after Sz 04/12/23. 03/2022 gliobastoma multiforme resection, HTN, HLD, migraine, fibromyalgia, seizure.  Clinical Impression  Pt pleasant, giggling and excited to be OOB. Pt with decline in function the last month and having assist of son at home as well as daughter. Daughter with autism and has been able to call 911 for pt and provide assist with simple meals. Son lives 5 min away but has been staying and helping consistently for the last month. Pt with expressive aphasia, some impulsivity and decreased RUE strength but improved from last admission. LB strength WFL and balance improved from prior admission. Pt currently needing supervision for safety with mobility. Pt with decreased balance and functional mobility who will benefit from acute therapy to maximize mobility and safety as well as continued HHPT. Daughter in law present throughout and discussed home modifications and safety concerns with questions answered.         If plan is discharge home, recommend the following: Assist for transportation;Help with stairs or ramp for entrance;Supervision due to cognitive status;Assistance with cooking/housework;Direct supervision/assist for medications management;Direct supervision/assist for financial management;A little help with walking and/or transfers;A little help with bathing/dressing/bathroom   Can travel by private vehicle        Equipment Recommendations None recommended by PT  Recommendations for Other Services  OT consult;Speech consult    Functional Status Assessment Patient has had a recent decline in their functional status and demonstrates the ability to make significant  improvements in function in a reasonable and predictable amount of time.     Precautions / Restrictions Precautions Precautions: Fall      Mobility  Bed Mobility Overal bed mobility: Needs Assistance Bed Mobility: Supine to Sit     Supine to sit: Supervision     General bed mobility comments: HOB flat, supervision for safety    Transfers Overall transfer level: Needs assistance   Transfers: Sit to/from Stand Sit to Stand: Supervision           General transfer comment: pt able to stand from bed and chair without assist    Ambulation/Gait Ambulation/Gait assistance: Supervision Gait Distance (Feet): 300 Feet Assistive device: Rolling walker (2 wheels), None Gait Pattern/deviations: Step-through pattern   Gait velocity interpretation: >2.62 ft/sec, indicative of community ambulatory   General Gait Details: pt initially walked 300' with use of RW with min cues for direction, pt picking up RW at times. 2nd trial 150' without AD with pt with steady gait and able to locate room without even looking at room numbers  Stairs            Wheelchair Mobility     Tilt Bed    Modified Rankin (Stroke Patients Only)       Balance Overall balance assessment: Mild deficits observed, not formally tested   Sitting balance-Leahy Scale: Normal       Standing balance-Leahy Scale: Good Standing balance comment: pt able to Rhomberg stance with eyes closed 30 sec. SLS bil LE 5 sec without assist, walk in hall unsupported                             Pertinent Vitals/Pain Pain Assessment Pain Assessment: No/denies pain  Home Living Family/patient expects to be discharged to:: Private residence Living Arrangements: Children Available Help at Discharge: Family;Available 24 hours/day Type of Home: House Home Access: Stairs to enter Entrance Stairs-Rails: Doctor, general practice of Steps: 3   Home Layout: One level Home Equipment: Clinical biochemist (2 wheels);Cane - single point;Transport chair      Prior Function Prior Level of Function : Needs assist             Mobility Comments: pt was independent until 8/18 with sz and progressive weakness, doesn't drive. Has been using RW and needing assist for the last month ADLs Comments: independent at baseline, assist for showering and meals for the last month. Son coming over to assist with medication, meals, finances     Extremity/Trunk Assessment   Upper Extremity Assessment Upper Extremity Assessment: RUE deficits/detail RUE Deficits / Details: 3+/5 strength with ROM WFL    Lower Extremity Assessment Lower Extremity Assessment: Overall WFL for tasks assessed    Cervical / Trunk Assessment Cervical / Trunk Assessment: Normal  Communication   Communication Cueing Techniques: Verbal cues  Cognition Arousal: Alert Behavior During Therapy: Impulsive Overall Cognitive Status: Difficult to assess Area of Impairment: Safety/judgement, Problem solving, Orientation                       Following Commands: Follows one step commands consistently Safety/Judgement: Decreased awareness of safety     General Comments: can be impulsive with expressive aphasia unable to consistently provide answers. Could not state birthdate but when asked current month stated "1957", with cues able to provide correct month        General Comments      Exercises     Assessment/Plan    PT Assessment Patient needs continued PT services  PT Problem List Decreased activity tolerance;Decreased mobility;Decreased balance;Decreased safety awareness;Decreased cognition       PT Treatment Interventions Gait training;Stair training;Functional mobility training;Therapeutic activities;Therapeutic exercise;Neuromuscular re-education;Balance training;Cognitive remediation;Patient/family education    PT Goals (Current goals can be found in the Care Plan section)  Acute Rehab PT  Goals Patient Stated Goal: to go home PT Goal Formulation: With patient/family Time For Goal Achievement: 05/21/23 Potential to Achieve Goals: Good    Frequency Min 1X/week     Co-evaluation               AM-PAC PT "6 Clicks" Mobility  Outcome Measure Help needed turning from your back to your side while in a flat bed without using bedrails?: None Help needed moving from lying on your back to sitting on the side of a flat bed without using bedrails?: None Help needed moving to and from a bed to a chair (including a wheelchair)?: A Little Help needed standing up from a chair using your arms (e.g., wheelchair or bedside chair)?: A Little Help needed to walk in hospital room?: A Little Help needed climbing 3-5 steps with a railing? : A Little 6 Click Score: 20    End of Session Equipment Utilized During Treatment: Gait belt Activity Tolerance: Patient tolerated treatment well Patient left: in chair;with call bell/phone within reach;with chair alarm set;with family/visitor present Nurse Communication: Mobility status PT Visit Diagnosis: Other abnormalities of gait and mobility (R26.89)    Time: 1610-9604 PT Time Calculation (min) (ACUTE ONLY): 29 min   Charges:   PT Evaluation $PT Eval Moderate Complexity: 1 Mod PT Treatments $Gait Training: 8-22 mins PT General Charges $$ ACUTE PT VISIT: 1 Visit  Merryl Hacker, PT Acute Rehabilitation Services Office: [phone]   Cristine Polio 05/07/2023, 12:26 PM

## 2023-05-08 ENCOUNTER — Other Ambulatory Visit: Payer: Self-pay

## 2023-05-08 MED ORDER — HYDROCODONE-ACETAMINOPHEN 5-325 MG PO TABS: 1.0000 | ORAL_TABLET | Freq: Four times a day (QID) | ORAL | 0 refills | Status: AC | PRN

## 2023-05-08 NOTE — TOC Initial Note (Signed)
Transition of Care [facility]) - Initial/Assessment Note    Patient Details  Name: Jennifer [name] MRN: [id number] Date of Birth: [birth year]  [facility]) CM/SW Contact:    Kermit Balo, RN Phone Number: 05/08/2023, 10:48 AM  Clinical Narrative:                 Pt readmitted for craniotomy that was scheduled.  Pt is active with [facility] for home health services from last admission. Family wants to continue with their services. CM has updated [facility] of admission and resumption orders.  Pt has needed DME at home.  Family is providing needed supervision/ support at home. Family will transport home when discharged.   Expected Discharge Plan: Home w Home Health Services Barriers to Discharge: Continued Medical Work up   Patient Goals and CMS Choice   CMS Medicare.gov Compare Post Acute Care list provided to:: Patient Represenative (must comment) Choice offered to / list presented to : Adult Children      Expected Discharge Plan and Services   Discharge Planning Services: CM Consult Post Acute Care Choice: Home Health Living arrangements for the past 2 months: Single Family Home                           HH Arranged: PT, OT, Nurse's Aide, Speech Therapy HH Agency: [facility] Date [facility] Agency Contacted: 05/08/23   Representative spoke with at [facility] Agency: Kandee Keen  Prior Living Arrangements/Services Living arrangements for the past 2 months: Single Family Home Lives with:: Adult Children Patient language and need for interpreter reviewed:: Yes Do you feel safe going back to the place where you live?: Yes        Care giver support system in place?: Yes (comment) Current home services: DME (walker/ [facility]) Criminal Activity/Legal Involvement Pertinent to Current Situation/Hospitalization: No - Comment as needed  Activities of Daily Living Home Assistive Devices/Equipment: Eyeglasses ADL Screening (condition at time of admission) Patient's cognitive ability  adequate to safely complete daily activities?: Yes Is the patient deaf or have difficulty hearing?: No Does the patient have difficulty seeing, even when wearing glasses/contacts?: No Does the patient have difficulty concentrating, remembering, or making decisions?: No Patient able to express need for assistance with ADLs?: Yes Does the patient have difficulty dressing or bathing?: No Independently performs ADLs?: Yes (appropriate for developmental age) Does the patient have difficulty walking or climbing stairs?: No Weakness of Legs: None Weakness of Arms/Hands: None  Permission Sought/Granted                  Emotional Assessment Appearance:: Appears stated age Attitude/Demeanor/Rapport: Engaged Affect (typically observed): Accepting Orientation: : Oriented to Self, Oriented to Place   Psych Involvement: No (comment)  Admission diagnosis:  Status post craniotomy [Z98.890] Brain tumor, glioma [facility]) [C71.9] Patient Active Problem List   Diagnosis Date Noted   Status post craniotomy 05/06/2023   Brain tumor, glioma (HCC) 05/06/2023   Acute right hemiparesis (HCC) 04/23/2023   Left-sided weakness 04/23/2023   Seizure disorder (HCC) 04/23/2023   Migraine 04/23/2023   Focal seizures (HCC) 03/31/2023   Glioblastoma multiforme of frontal lobe (HCC) 04/09/2022   Essential hypertension 06/13/2015   Bilateral chronic knee pain 10/14/2013   Chronic bilateral low back pain without sciatica 11/01/2012   S/P laparoscopic appendectomy 07/28/2012   Migraines 09/24/2011   Hyperlipidemia 09/24/2011   PCP:  Judd Lien, PA-C Pharmacy:   [facility] [phone] -  [place], [place] - [address] [place] [address] Phone: [phone] Fax: [phone]  [facility] [address] [place] [address] Phone: [phone] Fax: [phone]     Social Determinants of Health (SDOH) Social History: SDOH  Screenings   Food Insecurity: No Food Insecurity (02/26/2022)   Received from [facility]  Transportation Needs: No Transportation Needs (02/26/2022)   Received from [facility]  Utilities: Not At Risk (02/26/2022)   Received from [facility] visits prior to 10/25/2022., [facility] visits prior to 10/25/2022.  Depression (PHQ2-9): Low Risk  (03/10/2019)  Financial Resource Strain: Low Risk  (02/26/2022)   Received from [facility]  Physical Activity: Insufficiently Active (02/26/2022)   Received from [facility]  Social Connections: Socially Isolated (02/26/2022)   Received from [facility]  Stress: Stress Concern Present (02/26/2022)   Received from [facility]  Tobacco Use: Low Risk  (05/06/2023)   SDOH Interventions:     Readmission Risk Interventions     No data to display

## 2023-05-08 NOTE — Care Management Important Message (Signed)
Important Message  Patient Details  Name: Jennifer [name] MRN: [id number] Date of Birth: 12/07/1955   Medicare Important Message Given:  Yes     Mardene Sayer 05/08/2023, 3:12 PM

## 2023-05-08 NOTE — Discharge Instructions (Signed)
Craniotomy Care After Please read the instructions outlined below and refer to this sheet in the next few weeks. These discharge instructions provide you with general information on caring for yourself after you leave the hospital. Your surgeon may also give you specific instructions. While your treatment has been planned according to the most current medical practices available, unavoidable complications occasionally occur. If you have any problems or questions after discharge, please call your surgeon. Although there are many types of brain surgery, recovery following craniotomy (surgical opening of the skull) is much the same for each. However, recovery depends on many factors. These include the type and severity of brain injury and the type of surgery. It also depends on any nervous system function problems (neurological deficits) before surgery. If the craniotomy was done for cancer, chemotherapy and radiation could follow. You could be in the hospital from 5 days to a couple weeks. This depends on the type of surgery, findings, and whether there are complications. HOME CARE INSTRUCTIONS   It is not unusual to hear a clicking noise after a craniotomy, the plates and screws used to attach the bone flap can sometimes cause this. It is a normal occurrence if this does happen  Do not drive for 10 days after the operation  Your scalp may feel spongy for a while, because of fluid under it. This will gradually get better. Occasionally, the surgeon will not replace the bone that was removed to access the brain. If there is a bony defect, the surgeon will ask you to wear a helmet for protection. This is a discussion you should have with your surgeon prior to leaving the hospital (discharge).  Numbness may persist in some areas of your scalp.  Take all medications as directed. Sometimes steroids to control swelling are prescribed. Anticonvulsants to prevent seizures may also be given. Do not use alcohol,  other drugs, or medications unless your surgeon says it is OK.  Keep the wound dry and clean. The wound may be washed gently with soap and water. Then, you may gently blot or dab it dry, without rubbing. Do not take baths, use swimming pools or hot tubs for 10 days, or as instructed by your caregiver. It is best to wait to see you surgeon at your first postoperative visit, and to get directions at that time.  Only take over-the-counter or prescription medicines for pain, discomfort, or fever as directed by your caregiver.  You may continue your normal diet, as directed.  Walking is OK for exercise. Wait at least 3 months before you return to mild, non-contact sports or as your surgeon suggests. Contact sports should be avoided for at least 1 year, unless your surgeon says it is OK.  If you are prescribed steroids, take them exactly as prescribed. If you start having a decrease in nervous system functions (neurological deficits) and headaches as the dose of steroids is reduced, tell your surgeon right away.  When the anticonvulsant prescription is finished you no longer need to take it. SEEK IMMEDIATE MEDICAL CARE IF:   You develop nausea, vomiting, severe headaches, confusion, or you have a seizure.  You develop chest pain, a stiff neck, or difficulty breathing.  There is redness, swelling, or increasing pain in the wound or pin insertion sites.  You have an increase in swelling or bruising around the eyes.  There is drainage or pus coming from the wound.  You have an oral temperature above 102 F (38.9 C), not controlled by medicine.  You notice a foul smell coming from the wound or dressing.  The wound breaks open (edges not staying together) after the stitches have been removed.  You develop dizziness or fainting while standing.  You develop a rash.  You develop any reaction or side effects to the medications given. Document Released: 11/11/2005 Document Revised: 11/03/2011  Document Reviewed: 08/20/2009 [facility] Patient Information 2013 [place], [place].

## 2023-05-08 NOTE — Evaluation (Signed)
Occupational Therapy Evaluation Patient Details Name: Jennifer [name] MRN: [id number] DOB: November 24, 1955 Today's Date: 05/08/2023   History of Present Illness 67 y.o. 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 PMhx: admission 04/23/23 for progressive right sided weakness after Sz 04/12/23. 03/2022 gliobastoma multiforme resection, HTN, HLD, migraine, fibromyalgia, seizure.   Clinical Impression   Patient is s/p craniotomy surgery resulting in functional limitations due to the deficits listed below (see OT problem list). Pt will requires (A) for medication management and safety upon d/c at this time. Pt could be a great candidate to progress quickly to outpatient neuro based treatment.  Patient will benefit from skilled OT acutely to increase independence and safety with ADLS to allow discharge HHOT at this time.        If plan is discharge home, recommend the following: A little help with walking and/or transfers;A little help with bathing/dressing/bathroom;Direct supervision/assist for medications management;Direct supervision/assist for financial management    Functional Status Assessment  Patient has had a recent decline in their functional status and demonstrates the ability to make significant improvements in function in a reasonable and predictable amount of time.  Equipment Recommendations  None recommended by OT    Recommendations for Other Services Speech consult     Precautions / Restrictions Precautions Precautions: Fall Restrictions Weight Bearing Restrictions: No      Mobility Bed Mobility Overal bed mobility: Modified Independent                  Transfers Overall transfer level: Needs assistance   Transfers: Sit to/from Stand Sit to Stand: Supervision                  Balance Overall balance assessment: Mild deficits observed, not formally tested                                          ADL either performed or assessed with clinical judgement   ADL Overall ADL's : Needs assistance/impaired     Grooming: Contact guard assist                   Toilet Transfer: Contact guard assist   Toileting- Clothing Manipulation and Hygiene: Contact guard assist     Tub/Shower Transfer Details (indicate cue type and reason): educated on seated shower until balance improves. Answering family questions. educated on occlusion test in standing prior to attends in shower to determine readiness Functional mobility during ADLs: Contact guard assist       Vision Baseline Vision/History: 1 Wears glasses Ability to See in Adequate Light: 0 Adequate Additional Comments: pt able to visually scan and locate objects. pt provided two basins of personal items and could locate the tooth brush and tooth paste     Perception Perception: Impaired   Perception-Other Comments: R decreased attention compare d to L   Praxis         Pertinent Vitals/Pain Pain Assessment Pain Assessment: No/denies pain     Extremity/Trunk Assessment Upper Extremity Assessment Upper Extremity Assessment: Overall WFL for tasks assessed   Lower Extremity Assessment Lower Extremity Assessment: Defer to PT evaluation;Overall [facility] for tasks assessed   Cervical / Trunk Assessment Cervical / Trunk Assessment: Normal   Communication Communication Communication: Difficulty communicating thoughts/reduced clarity of speech Cueing Techniques: Verbal cues;Visual cues   Cognition Arousal: Alert Behavior During Therapy: Impulsive  Overall Cognitive Status: Impaired/Different from baseline Area of Impairment: Safety/judgement, Problem solving, Memory                     Memory: Decreased short-term memory Following Commands: Follows one step commands consistently Safety/Judgement: Decreased awareness of safety   Problem Solving: Slow processing, Requires verbal cues, Requires tactile  cues General Comments: pt demonstrates word retrieval deficits and increased accuracy with visual attention of an actual object -- pt reports that "people" have been doing her pillbox but unable to find the word to define "whom" pt unable to use the word "blue berry" but when asked "is it a strawberry or blue berry" immediately give the correct name     General Comments  dressing in place and noted to have mild L eye edema compared to the R eye    Exercises     Shoulder Instructions      Home Living Family/patient expects to be discharged to:: Private residence Living Arrangements: Children Available Help at Discharge: Family;Available 24 hours/day Type of Home: House Home Access: Stairs to enter [employer] of Steps: 3 Entrance Stairs-Rails: Right;Left Home Layout: One level     Bathroom Shower/Tub: Producer, television/film/video: Handicapped height Bathroom Accessibility: No How Accessible: Accessible via walker Home Equipment: Agricultural consultant (2 wheels);Cane - single point;Transport chair   Additional Comments: lives with daughter Lurena Joiner whom has dx of austim. Lurena Joiner has been bringing medication to the patient from a presetup pillbox and only uses the microwave per family rules for safety. Daughter in law present reports that the patietn has been managing the pillbox until this time  Lives With: Daughter    Prior Functioning/Environment Prior Level of Function : Needs assist             Mobility Comments: pt was independent until 8/18 with sz and progressive weakness, doesn't drive. Has been using RW and needing assist for the last month ADLs Comments: independent at baseline, assist for showering and meals for the last month. Son coming over to assist with medication, meals, finances        OT Problem List: Decreased activity tolerance;Impaired balance (sitting and/or standing);Decreased cognition;Decreased safety awareness;Decreased knowledge of use of  DME or AE;Decreased knowledge of precautions      OT Treatment/Interventions: Self-care/ADL training;Energy conservation;DME and/or AE instruction;Therapeutic activities;Cognitive remediation/compensation;Balance training;Patient/family education    OT Goals(Current goals can be found in the care plan section) Acute Rehab OT Goals Patient Stated Goal: to go home. pt states ill be all better sunday. Three days OT Goal Formulation: With patient Time For Goal Achievement: 05/22/23 Potential to Achieve Goals: Good  OT Frequency: Min 1X/week    Co-evaluation              AM-PAC OT "6 Clicks" Daily Activity     Outcome Measure Help from another person eating meals?: A Little Help from another person taking care of personal grooming?: A Little Help from another person toileting, which includes using toliet, bedpan, or urinal?: A Little Help from another person bathing (including washing, rinsing, drying)?: A Little Help from another person to put on and taking off regular upper body clothing?: A Little Help from another person to put on and taking off regular lower body clothing?: A Little 6 Click Score: 18   End of Session Nurse Communication: Mobility status;Precautions  Activity Tolerance: Patient tolerated treatment well Patient left: in bed;with call bell/phone within reach;with chair alarm set;with family/visitor present  OT  Visit Diagnosis: Unsteadiness on feet (R26.81);Muscle weakness (generalized) (M62.81);Other symptoms and signs involving cognitive function;Cognitive communication deficit (R41.841)                Time: 4098-1191 OT Time Calculation (min): 25 min Charges:  OT General Charges $OT Visit: 1 Visit OT Evaluation $OT Eval Moderate Complexity: 1 Mod   Brynn, OTR/L  Acute Rehabilitation Services Office: [phone] .   Mateo Flow 05/08/2023, 11:29 AM

## 2023-05-08 NOTE — Discharge Summary (Signed)
Physician Discharge Summary  Patient ID: Jennifer [name] MRN: [id number] DOB/AGE: 03-Jul-1956 67 y.o.  Admit date: 05/06/2023 Discharge date: 05/08/2023  Admission Diagnoses:glioblastoma multiforme left frontal lobe  Discharge Diagnoses:  Principal Problem:   Status post craniotomy Active Problems:   Brain tumor, glioma [facility])   Discharged Condition: good  Hospital Course: Jennifer [name] was admitted and taken to the operating room for a redo craniotomy, tumor resection, and gliadel wafer placement. Post op she is at her baseline, moving all extremities and very cheerful. Wound is clean and dry.   Treatments: surgery: left frontal craniotomy for tumor resection  Discharge Exam: Blood pressure (!) 117/51, pulse 97, temperature 98.6 F (37 C), temperature source Oral, resp. rate 18, height 5\' 3"  (1.6 m), weight 75.3 kg, SpO2 96%. General appearance: alert, cooperative, appears stated age, and no distress  Disposition: Discharge disposition: 01-Home or Self Care      Brain Tumor  Allergies as of 05/08/2023   No Known Allergies      Medication List     TAKE these medications    amLODipine 5 MG tablet Commonly known as: NORVASC TAKE 1 TABLET BY MOUTH EVERY DAY   atorvastatin 10 MG tablet Commonly known as: LIPITOR TAKE 1 TABLET BY MOUTH EVERY DAY   busPIRone 5 MG tablet Commonly known as: BUSPAR TAKE 1 TABLET BY MOUTH THREE TIMES A DAY   dexamethasone 2 MG tablet Commonly known as: DECADRON Take 1 tablet (2 mg total) by mouth every 6 (six) hours.   fluticasone 50 MCG/ACT nasal spray Commonly known as: FLONASE SPRAY 2 SPRAYS INTO EACH NOSTRIL EVERY DAY   HYDROcodone-acetaminophen 5-325 MG tablet Commonly known as: NORCO/VICODIN Take 1 tablet by mouth every 6 (six) hours as needed for up to 8 days for moderate pain.   lacosamide 50 MG Tabs tablet Commonly known as: VIMPAT Take 1 tablet (50 mg total) by mouth 2 (two) times daily.   losartan 50 MG  tablet Commonly known as: COZAAR TAKE 1 TABLET BY MOUTH EVERY DAY   ondansetron 8 MG tablet Commonly known as: Zofran Take 1 tablet (8 mg total) by mouth every 8 (eight) hours as needed for nausea or vomiting. May take 30-60 minutes prior to Temodar administration if nausea/vomiting occurs as needed.   prochlorperazine 10 MG tablet Commonly known as: COMPAZINE Take 1 tablet (10 mg total) by mouth every 6 (six) hours as needed for nausea or vomiting.   SUMAtriptan 50 MG tablet Commonly known as: IMITREX Take 50 mg by mouth every 2 (two) hours as needed for migraine. May repeat in 2 hours if headache persists or recurs.   temozolomide 250 MG capsule Commonly known as: TEMODAR Take 1 capsule (250 mg total) by mouth daily. Take for 5 days on, 23 days off. Repeat every 28 days. May take on an empty stomach to decrease nausea & vomiting.   topiramate 50 MG tablet Commonly known as: TOPAMAX TAKE 1 TABLET BY MOUTH TWICE A DAY   traZODone 50 MG tablet Commonly known as: DESYREL Take 1 tablet (50 mg total) by mouth at bedtime as needed for sleep.        Follow-up Information     Coletta Memos, MD Follow up.   Specialty: Neurosurgery Why: call the office to confirm the appointment for staple removal Contact information: [address] [place] [address] [phone]                 Signed: Coletta Memos 05/08/2023, 10:45  PM

## 2023-05-09 ENCOUNTER — Other Ambulatory Visit (HOSPITAL_COMMUNITY): Payer: Self-pay

## 2023-05-11 LAB — SURGICAL PATHOLOGY

## 2023-05-14 ENCOUNTER — Other Ambulatory Visit: Payer: Self-pay

## 2023-05-14 ENCOUNTER — Other Ambulatory Visit (HOSPITAL_COMMUNITY): Payer: Self-pay

## 2023-05-14 ENCOUNTER — Inpatient Hospital Stay: Payer: Medicare Other | Attending: Internal Medicine | Admitting: Internal Medicine

## 2023-05-14 VITALS — BP 122/62 | HR 97 | Temp 97.9°F | Resp 20 | Wt 162.8 lb

## 2023-05-14 DIAGNOSIS — Z79899 Other long term (current) drug therapy: Secondary | ICD-10-CM | POA: Diagnosis not present

## 2023-05-14 DIAGNOSIS — Z9889 Other specified postprocedural states: Secondary | ICD-10-CM | POA: Diagnosis not present

## 2023-05-14 DIAGNOSIS — G4700 Insomnia, unspecified: Secondary | ICD-10-CM | POA: Insufficient documentation

## 2023-05-14 DIAGNOSIS — R569 Unspecified convulsions: Secondary | ICD-10-CM | POA: Diagnosis not present

## 2023-05-14 DIAGNOSIS — C711 Malignant neoplasm of frontal lobe: Secondary | ICD-10-CM | POA: Diagnosis present

## 2023-05-14 MED ORDER — DEXAMETHASONE 1 MG PO TABS: ORAL_TABLET | ORAL | 0 refills | Status: AC

## 2023-05-14 MED ORDER — TEMOZOLOMIDE 250 MG PO CAPS
150.0000 mg/m2/d | ORAL_CAPSULE | Freq: Every day | ORAL | 0 refills | Status: DC
  Filled 2023-05-14: qty 5, 28d supply, fill #0
  Filled 2023-05-14: qty 5, 5d supply, fill #0

## 2023-05-14 MED ORDER — PROCHLORPERAZINE MALEATE 10 MG PO TABS: 10.0000 mg | ORAL_TABLET | Freq: Four times a day (QID) | ORAL | 1 refills | Status: DC | PRN

## 2023-05-14 MED ORDER — LACOSAMIDE 50 MG PO TABS: 50.0000 mg | ORAL_TABLET | Freq: Two times a day (BID) | ORAL | 2 refills | Status: DC

## 2023-05-14 NOTE — Progress Notes (Signed)
[facility] [address]  [place], [place] [address] [phone]   Interval Evaluation  Date of Service: 05/14/23 [name] Patient MRN: [id number] Patient DOB: 03-Dec-1955 Provider: Henreitta Leber, MD  Identifying Statement:  Jennifer [name] is a 67 y.o. female with left frontal glioblastoma   Oncologic History: Oncology History  Glioblastoma multiforme of frontal lobe (HCC)  04/11/2022 Surgery   Craniotomy, resection of left frontal mass with Dr. Franky Macho; path is glioblastoma IDHwt.   05/12/2022 - 06/23/2022 Radiation Therapy   IMRT and concurrent Temodar 75mg /m2   07/21/2022 - 03/31/2023 Chemotherapy   Completes 7 cycles of 5-day Temodar   04/23/2023 Progression   Progression of disease   05/06/2023 Surgery   Second craniotomy, resection with Dr. Franky Macho     Biomarkers:  MGMT Unknown.  IDH 1/2 Wild type.  EGFR Unknown  TERT Unknown   Interval History: Jennifer [name] presents today for follow up after recently completing second craniotomy with Dr. Franky Macho (9/11).  She feels significantly improved, with right sided strength returned back to normal.  Still having difficulty getting words out, that is "90% back" compared to her baseline pre-surgery.  No recurrence of seizures now on the vimpat and Topamax.  Decadron remains at 4mg  4x per day.   H+P (05/05/22) Patient presented to neurologic attention in mid-August 2023 with new onset difficulty speaking, confusion noticed by family.  She and her family described sudden onset speech impairment and confusion in the afternoon of the 16th, after having been seen in normal state of health that morning.  CNS imaging demonstrated an enhancing left frontal mass, which was resected by Dr. Franky Macho on 04/11/22.  Following surgery she had some speech difficulty, but improved quickly through rehab admission.  At present, she is fully independent with gait, speech, ADLs.  She is off  decadron and Keppra.  Retired, had worked at [employer] in [employer].  Today presents with son and daughter in law.    Medications: Current Outpatient Medications on File Prior to Visit  Medication Sig Dispense Refill   amLODipine (NORVASC) 5 MG tablet TAKE 1 TABLET BY MOUTH EVERY DAY (Patient taking differently: Take 5 mg by mouth daily.) 90 tablet 1   atorvastatin (LIPITOR) 10 MG tablet TAKE 1 TABLET BY MOUTH EVERY DAY (Patient taking differently: Take 10 mg by mouth daily.) 90 tablet 1   busPIRone (BUSPAR) 5 MG tablet TAKE 1 TABLET BY MOUTH THREE TIMES A DAY (Patient taking differently: Take 5 mg by mouth 3 (three) times daily.) 270 tablet 1   dexamethasone (DECADRON) 2 MG tablet Take 1 tablet (2 mg total) by mouth every 6 (six) hours. 60 tablet 0   fluticasone (FLONASE) 50 MCG/ACT nasal spray SPRAY 2 SPRAYS INTO EACH NOSTRIL EVERY DAY (Patient not taking: Reported on 04/23/2023) 48 mL 0   HYDROcodone-acetaminophen (NORCO/VICODIN) 5-325 MG tablet Take 1 tablet by mouth every 6 (six) hours as needed for up to 8 days for moderate pain. 30 tablet 0   lacosamide (VIMPAT) 50 MG TABS tablet Take 1 tablet (50 mg total) by mouth 2 (two) times daily. 60 tablet 0   losartan (COZAAR) 50 MG tablet TAKE 1 TABLET BY MOUTH EVERY DAY (Patient taking differently: Take 50 mg by mouth daily.) 90 tablet 1   ondansetron (ZOFRAN) 8 MG tablet Take 1 tablet (8 mg total) by mouth every 8 (eight) hours as needed for nausea or vomiting. May take 30-60 minutes prior to  Temodar administration if nausea/vomiting occurs as needed. 30 tablet 1   prochlorperazine (COMPAZINE) 10 MG tablet Take 1 tablet (10 mg total) by mouth every 6 (six) hours as needed for nausea or vomiting. 30 tablet 1   SUMAtriptan (IMITREX) 50 MG tablet Take 50 mg by mouth every 2 (two) hours as needed for migraine. May repeat in 2 hours if headache persists or recurs.     temozolomide (TEMODAR) 250 MG capsule Take 1 capsule (250 mg total) by mouth daily. Take  for 5 days on, 23 days off. Repeat every 28 days. May take on an empty stomach to decrease nausea & vomiting. 5 capsule 0   topiramate (TOPAMAX) 50 MG tablet TAKE 1 TABLET BY MOUTH TWICE A DAY (Patient taking differently: Take 50 mg by mouth 2 (two) times daily.) 60 tablet 0   traZODone (DESYREL) 50 MG tablet Take 1 tablet (50 mg total) by mouth at bedtime as needed for sleep. 30 tablet 2   No current facility-administered medications on file prior to visit.    Allergies: No Known Allergies Past Medical History:  Past Medical History:  Diagnosis Date   Brain cancer (HCC)    Fibromyalgia    Hyperlipidemia    Hypertension    Migraine    Seizures (HCC)    Past Surgical History:  Past Surgical History:  Procedure Laterality Date   APPLICATION OF CRANIAL NAVIGATION N/A 04/11/2022   Procedure: APPLICATION OF CRANIAL NAVIGATION;  Surgeon: Coletta Memos, MD;  Location: [facility] OR;  Service: Neurosurgery;  Laterality: N/A;   APPLICATION OF CRANIAL NAVIGATION Left 05/06/2023   Procedure: APPLICATION OF CRANIAL NAVIGATION;  Surgeon: Coletta Memos, MD;  Location: [facility] OR;  Service: Neurosurgery;  Laterality: Left;   COLONOSCOPY     CRANIOTOMY Left 04/11/2022   Procedure: LEFT FRONTAL CRANIOTOMY FOR RESECTION OF TUMOR WITH [name];  Surgeon: Coletta Memos, MD;  Location: [facility] OR;  Service: Neurosurgery;  Laterality: Left;  RM 21   CRANIOTOMY Left 05/06/2023   Procedure: REDO LEFT CRANIOTOMY FOR TUMOR WITH STEALTH;  Surgeon: Coletta Memos, MD;  Location: [facility] OR;  Service: Neurosurgery;  Laterality: Left;   LAPAROSCOPIC APPENDECTOMY  07/15/2012   Procedure: APPENDECTOMY LAPAROSCOPIC;  Surgeon: Liz Malady, MD;  Location: [facility] OR;  Service: General;  Laterality: N/A;  Laparoscopic Appendectomy   Social History:  Social History   Socioeconomic History   Marital status: Widowed    Spouse name: Not on file   Number of children: 2   Years of education: Not on file   Highest education level: Not on file   Occupational History   Occupation: retail    Comment: [employer] in [place], [place]  Tobacco Use   Smoking status: Never   Smokeless tobacco: Never  Vaping Use   Vaping status: Never Used  Substance and Sexual Activity   Alcohol use: Not Currently    Comment: 2 drinks once every 2-3 months   Drug use: No   Sexual activity: Not Currently    Birth control/protection: Post-menopausal  Other Topics Concern   Not on file  Social History Narrative   Marital status: widowed since 2008; husband was bipolar and alcoholic      Children: 2 children (28, 19); son lives with patient.Daughter lives in [place]/autism/does not drive.  Daughter lives with mother-in-law.  One grandpuppy      Lives: with son      Employment: works at [employer]; also works in Corporate investment banker business.      Tobacco: none  Alcohol: none; husband was an alcoholic.      Drugs:   none      Exercise:   Social Determinants of Health   Financial Resource Strain: Low Risk  (02/26/2022)   Received from [facility]   Overall Financial Resource Strain (CARDIA)  Food Insecurity: No Food Insecurity (02/26/2022)   Received from [facility]   Hunger Vital Sign  Transportation Needs: No Transportation Needs (02/26/2022)   Received from [facility] - Transportation  Physical Activity: Insufficiently Active (02/26/2022)   Received from [facility]   Exercise Vital Sign  Stress: Stress Concern Present (02/26/2022)   Received from [facility]   [employer] of Occupational Health - Occupational Stress Questionnaire  Social Connections: Socially Isolated (02/26/2022)   Received from [facility]   Social Connection and Isolation Panel [NHANES]  Intimate Partner Violence: Not At Risk (02/26/2022)   Received from [facility] visits prior to 10/25/2022., [facility] visits prior to 10/25/2022.   Humiliation, Afraid, Rape, and Kick questionnaire    Fear of Current or  Ex-Partner: No    Emotionally Abused: No    Physically Abused: No    Sexually Abused: No   Family History:  Family History  Adopted: Yes    Review of Systems: Constitutional: Doesn't report fevers, chills or abnormal weight loss Eyes: Doesn't report blurriness of vision Ears, nose, mouth, throat, and face: Doesn't report sore throat Respiratory: Doesn't report cough, dyspnea or wheezes Cardiovascular: Doesn't report palpitation, chest discomfort  Gastrointestinal:  Doesn't report nausea, constipation, diarrhea GU: Doesn't report incontinence Skin: Doesn't report skin rashes Neurological: Per HPI Musculoskeletal: Doesn't report joint pain Behavioral/Psych: Doesn't report anxiety  Physical Exam: Vitals:   05/14/23 1129  BP: 122/62  Pulse: 97  Resp: 20  Temp: 97.9 F (36.6 C)  SpO2: 99%    KPS: 80 ECOG: 1 General: Alert, cooperative, pleasant, in no acute distress Head: Surgical sutures EENT: No conjunctival injection or scleral icterus.  Lungs: Resp effort normal Cardiac: Regular rate Abdomen: Non-distended abdomen Skin: No rashes cyanosis or petechiae. Extremities: No clubbing or edema  Neurologic Exam: Mental Status: Awake, alert, attentive to examiner. Oriented to self and environment. Language is modestly impaired with regards to fluency.  Pseudobulbar elements. Cranial Nerves: Visual acuity is grossly normal. Visual fields are full. Extra-ocular movements intact. No ptosis. Face is symmetric Motor: Tone and bulk are normal. Power is full in both arms and legs. Reflexes are symmetric, no pathologic reflexes present.  Sensory: Intact to light touch Gait: Normal.   Labs: I have reviewed the data as listed    Component Value Date/Time   NA 137 05/06/2023 1757   NA 140 10/04/2018 1703   K 3.8 05/06/2023 1757   CL 104 04/28/2023 0740   CO2 21 (L) 04/28/2023 0740   GLUCOSE 143 (H) 04/28/2023 0740   BUN 27 (H) 04/28/2023 0740   BUN 24 10/04/2018 1703    CREATININE 1.08 (H) 05/06/2023 2124   CREATININE 1.29 (H) 03/31/2023 1113   CREATININE 1.06 (H) 01/29/2016 1518   CALCIUM 9.4 04/28/2023 0740   PROT 5.3 (L) 04/26/2023 0449   PROT 6.8 10/04/2018 1703   ALBUMIN 3.1 (L) 04/26/2023 0449   ALBUMIN 4.6 10/04/2018 1703   AST 17 04/26/2023 0449   AST 20 03/31/2023 1113   ALT 24 04/26/2023 0449   ALT 24 03/31/2023 1113   ALKPHOS 49 04/26/2023 0449   BILITOT 0.6 04/26/2023 0449  BILITOT 0.5 03/31/2023 1113   GFRNONAA 57 (L) 05/06/2023 2124   GFRNONAA 46 (L) 03/31/2023 1113   GFRNONAA 58 (L) 01/29/2016 1518   GFRAA 70 10/04/2018 1703   GFRAA 67 01/29/2016 1518   Lab Results  Component Value Date   WBC 20.8 (H) 05/06/2023   NEUTROABS 6.9 04/26/2023   HGB 12.5 05/06/2023   HCT 36.7 05/06/2023   MCV 98.4 05/06/2023   PLT 152 05/06/2023    Imaging:  [facility] Clinician Interpretation: I have personally reviewed the CNS images as listed.  My interpretation, in the context of the patient's clinical presentation, is progressive disease pending official read  MR BRAIN W WO CONTRAST  Result Date: 05/07/2023 CLINICAL DATA:  Brain neoplasm, status post resection EXAM: MRI HEAD WITHOUT AND WITH CONTRAST TECHNIQUE: Multiplanar, multiecho pulse sequences of the brain and surrounding structures were obtained without and with intravenous contrast. CONTRAST:  7mL GADAVIST GADOBUTROL 1 MMOL/ML IV SOLN COMPARISON:  05/04/2023 FINDINGS: Brain: Status post resection of anterior left frontal lobe enhancing tumor. Along the anterior/medial resection border, there is an area of nodular contrast enhancement that measures 1.8 x 1.6 cm. There is a second, smaller focus of enhancement near the inferior margin that measures 6 mm. No unexpected hemorrhage. Small volume blood products at the resection site. Pneumocephalus. No midline shift or other mass effect. Large region of hyperintense T2-weighted signal predominately within the left frontal lobe but extending also to  the right frontal lobe and left parietal lobe is unchanged. Vascular: Normal flow voids. Skull and upper cervical spine: Status post left anterior craniotomy. Sinuses/Orbits: Paranasal sinuses are clear. No mastoid effusion. Normal orbits. Other: None IMPRESSION: 1. Status post resection of anterior left frontal lobe tumor with focus of residual nodular contrast enhancement measuring 1.8 x 1.6 cm. 2. Second, smaller focus of enhancement near the inferior resection margin measures 6 mm. 3. Unchanged extent of hyperintense T2-weighted signal within both hemispheres Electronically Signed   By: Deatra Robinson M.D.   On: 05/07/2023 02:57   MR BRAIN W WO CONTRAST  Result Date: 05/04/2023 CLINICAL DATA:  Follow-up frontal lobe glioblastoma. Assess treatment response. EXAM: MRI HEAD WITHOUT AND WITH CONTRAST TECHNIQUE: Multiplanar, multiecho pulse sequences of the brain and surrounding structures were obtained without and with intravenous contrast. CONTRAST:  8mL GADAVIST GADOBUTROL 1 MMOL/ML IV SOLN COMPARISON:  04/23/2023.  03/26/2023.  01/22/2023. FINDINGS: Brain: Since the immediate prior exam, there is slight further increase in tumor enhancement at the posterior frontal vertex. Precise measurement is difficult because of the irregular nature and difference in slice position but the volume of enhancing tissue may be increased 10% since the immediate prior exam. Mass-effect and vasogenic edema throughout the region or similar, with left-to-right shift of 3 mm in the midline. No new satellite areas of enhancement are evident. No posterior fossa abnormality. Ventricular size is stable. Vascular: Major vessels at the base of the brain show flow. Skull and upper cervical spine: Craniotomy findings on the left. Otherwise negative. Sinuses/Orbits: Clear/normal Other: None IMPRESSION: Since the immediate prior study, there is slight further increase in tumor enhancement at the posterior frontal vertex. Precise measurement is  difficult but the volume of enhancing tissue may be 10% increased since the immediate prior exam. Mass-effect and vasogenic edema are similar, with left-to-right shift of 3 mm in the midline. No new satellite areas of enhancement. Electronically Signed   By: Paulina Fusi M.D.   On: 05/04/2023 14:40   Overnight EEG with video  Result Date:  04/25/2023 Charlsie Quest, MD     04/26/2023  9:24 AM ,[name] MRN: [id number] Epilepsy Attending: Charlsie Quest Referring Physician/Provider: Erick Blinks, MD Duration: 04/24/2023 1455 to 04/25/2023 1455 Patient history: 67 y.o. female with PMH significant for with PMH significant for HLD, HTN, migraine, GBM s/p resection who presents with R sided weakness that has progressively worsened over the last 10 days, much more worse over last 3 days. Her neurologic examination is notable for RUE plegic and RLE weakness. EEG to evaluate for seizure Level of alertness: Awake, asleep AEDs during EEG study: TPM Technical aspects: This EEG study was done with scalp electrodes positioned according to the 10-20 International system of electrode placement. Electrical activity was reviewed with band pass filter of 1-70Hz , sensitivity of 7 uV/mm, display speed of 71mm/sec with a 60Hz  notched filter applied as appropriate. EEG data were recorded continuously and digitally stored.  Video monitoring was available and reviewed as appropriate. Description: The posterior dominant rhythm consists of 8 Hz activity of moderate voltage (25-35 uV) seen predominantly in posterior head regions, asymmetric ( left<right)  and reactive to eye opening and eye closing.  Sleep was characterized by vertex waves, sleep spindles (12 to 14 Hz), maximal frontocentral region. EEG showed continuous polymorphic 3 to 6 Hz theta-delta slowing in left hemisphere with overriding sharply contoured activity in left centro-temporal region consistent with breach artifact. Hyperventilation and photic  stimulation were not performed.   ABNORMALITY - Continuous slow, left hemisphere - Breach artifact, left centro-temporal region - Background asymmetry, left<right IMPRESSION: This study is suggestive of cortical dysfunction arising from left hemisphere likely secondary to underlying tumor. Additionally there is cortical dysfunction in left centro-temporal region consistent with prior craniotomy. No seizures or definite epileptiform discharges were seen throughout the recording. Charlsie Quest   MR BRAIN W WO CONTRAST  Result Date: 04/23/2023 CLINICAL DATA:  Brain/CNS neoplasm, monitor worsening weakness EXAM: MRI HEAD WITHOUT AND WITH CONTRAST TECHNIQUE: Multiplanar, multiecho pulse sequences of the brain and surrounding structures were obtained without and with intravenous contrast. CONTRAST:  7mL GADAVIST GADOBUTROL 1 MMOL/ML IV SOLN COMPARISON:  Brain MR 03/26/23 FINDINGS: Brain: Negative for an acute infarct. No hemorrhage. No hydrocephalus. No extra-axial fluid collection. Compared to prior exam there is new 3 mm rightward midline shift. Postsurgical changes from a left frontal craniotomy with a subjacent resection cavity redemonstrated. Compared to prior exam there is marked interval increase in the degree of T2/FLAIR hyperintense signal abnormality surrounding the contrast-enhanced tumor in the left frontal lobe. T2/FLAIR hyperintense signal abnormality now extends posteriorly to the level of the postcentral gyrus. T2/FLAIR hyperintense signal abnormality also extends to involve a large portion of the opercular region. The contrast-enhancing portion of the tumor has also increased in size, best seen on the sagittal post contrast-enhanced T1 weighted see series, now measuring up to 4.8 x 2.8 cm, previously 4.1 x 2.4 cm (series 20, image 15). On the axial post contrast-enhanced sequence the increased in size is best seen along the posterior most aspect, where the contrast-enhancing tumor now measures 2.0 x  1.8 cm, previously 1.3 x 0.8 cm. Vascular: Normal flow voids. Skull and upper cervical spine: Normal marrow signal. Sinuses/Orbits: No middle ear or mastoid effusion. Paranasal sinuses are clear. Orbits are unremarkable. Other: None. IMPRESSION: Interval increase in size of contrast-enhancing tumor centered in the left frontal lobe, as well as the surrounding T2/FLAIR hyperintense signal abnormality which now extends posteriorly to the level of the postcentral gyrus. There  is also new 3 mm rightward midline shift. Electronically Signed   By: Lorenza Cambridge M.D.   On: 04/23/2023 19:03   CT Head Wo Contrast  Result Date: 04/23/2023 CLINICAL DATA:  Left-sided weakness, history of glioblastoma EXAM: CT HEAD WITHOUT CONTRAST TECHNIQUE: Contiguous axial images were obtained from the base of the skull through the vertex without intravenous contrast. RADIATION DOSE REDUCTION: This exam was performed according to the departmental dose-optimization program which includes automated exposure control, adjustment of the mA and/or kV according to patient size and/or use of iterative reconstruction technique. COMPARISON:  04/12/2023 FINDINGS: Brain: Vasogenic edema of the bilateral frontal lobes is again identified, left greater than right, slightly more pronounced than prior study. The tumor margins within the left frontal lobe seen on prior MRI are difficult to delineate on this unenhanced CT. No evidence of acute infarct or hemorrhage. Lateral ventricles and remaining midline structures are unremarkable. No acute extra-axial fluid collections. No mass effect. Vascular: No hyperdense vessel or unexpected calcification. Skull: Prior left frontal craniotomy.  No acute bony abnormality. Sinuses/Orbits: No acute finding. Other: None. IMPRESSION: 1. Continued vasogenic edema within the bilateral frontal lobes, left greater than right, slightly more pronounced than prior study. Tumor margins within the left frontal lobe are poorly  delineated on unenhanced CT. 2. No acute infarct or hemorrhage. Electronically Signed   By: Sharlet Salina M.D.   On: 04/23/2023 15:32     Assessment/Plan Glioblastoma multiforme of frontal lobe (HCC)  Focal seizures (HCC)  Status post craniotomy  Jennifer [name] is clinically stable today after completing repeat craniotomy for local progression of tumor.  Path report demonstrated glioblastoma with foci of necrosis.  Motor deficits on the right have improved.  We recommended resuming treatment with cycle #8 Temozolomide 200 mg/m2, on for five days and off for twenty three days in twenty eight day cycles. The patient will have a complete blood count performed on days 21 and 28 of each cycle, and a comprehensive metabolic panel performed on day 28 of each cycle. Labs may need to be performed more often. Zofran will prescribed for home use for nausea/vomiting.   Cycle #8 will be dosed on 06/02/23 as discussed.  Chemotherapy should be held for the following:  ANC less than 1,000  Platelets less than 100,000  LFT or creatinine greater than 2x ULN  If clinical concerns/contraindications develop  We recommended she continue Vimpat 50mg  BID for seizure prevention, as well as Topiramate 50mg  BID.  Decadron should decrease to 4mg  daily, then decrease by 1mg  each week until discontinued.  May con't Trazodone 50mg  HS for insomnia.  We ask that Jennifer [name] return to clinic in 1-2 months with MRI brain for evaluation following cycle 8.  All questions were answered. The patient knows to call the clinic with any problems, questions or concerns. No barriers to learning were detected.  The total time spent in the encounter was 40 minutes and more than 50% was on counseling and review of test results   Henreitta Leber, MD Medical Director of Neuro-Oncology [facility] 05/14/23 11:33 AM

## 2023-05-16 ENCOUNTER — Other Ambulatory Visit: Payer: Self-pay

## 2023-05-19 ENCOUNTER — Other Ambulatory Visit: Payer: Self-pay

## 2023-06-03 ENCOUNTER — Other Ambulatory Visit: Payer: Self-pay

## 2023-06-03 ENCOUNTER — Telehealth: Payer: Self-pay

## 2023-06-03 NOTE — Telephone Encounter (Signed)
TC from pt, asking if she could skip her next dose of chemotherapy. Informed that unless MD suggested this, she should continue on w/ treatment regimen. When asked what type of chemo she was taking, she answers, "None." Reviewed Dr. Liana Gerold notes and see that pt is on "Temozolomide 200 mg/m2, on for five days and off for twenty three days in twenty eight day cycles," and that "Cycle #8 will be dosed on 06/02/23." Read this aloud to pt and explained that she was supposed to start taking Temozolomide yesterday. She states she did not do this, so advised to start taking today. Pt begins repeating herself as if she is confused. Daughter picks up the phone and we discussed all of the above information regarding pt's treatment schedule. She states pt will begin cycle 8 of Temozolomide this evening. Advised to call office for any questions or concerns.

## 2023-06-04 ENCOUNTER — Other Ambulatory Visit: Payer: Self-pay

## 2023-06-08 ENCOUNTER — Telehealth: Payer: Self-pay | Admitting: *Deleted

## 2023-06-08 ENCOUNTER — Other Ambulatory Visit: Payer: Self-pay | Admitting: Internal Medicine

## 2023-06-08 MED ORDER — LORAZEPAM 2 MG PO TABS: 2.0000 mg | ORAL_TABLET | Freq: Three times a day (TID) | ORAL | 0 refills | Status: DC | PRN

## 2023-06-08 MED ORDER — LACOSAMIDE 100 MG PO TABS: 100.0000 mg | ORAL_TABLET | Freq: Two times a day (BID) | ORAL | 2 refills | Status: DC

## 2023-06-08 NOTE — Telephone Encounter (Signed)
Communicated response to patients daughter.  She stated understanding

## 2023-06-08 NOTE — Telephone Encounter (Signed)
Patients daughter in law called to report that patient just had a seizure.  Lasted approximately 1-2 minutes.  Patient was unconscious during but now is alert and very emotional.  Now has no control over her right side.  She just finished her 5th and last day of Temodar yesterday.  No new illness.  Did sleep poorly the last couple of nights during chemo due to nausea.  Reports is dosing antiepileptics as instructed (Vimpat 50 BID, Topiramate 50 BID).   Gave advice to call 911 if the patient should have a repeat seizure that lasts for more than 5 mintues.  Routing to Dr Barbaraann Cao to advise if need medication changes or PRN drugs for seizure control.

## 2023-06-11 ENCOUNTER — Telehealth: Payer: Self-pay | Admitting: *Deleted

## 2023-06-11 NOTE — Telephone Encounter (Signed)
Returned to patient's daughter, Marcelino Duster - she called earlier stating that patient still has significant weakness on her R side since seizure on 06/08/23.  She is asking if patient needs to restart steroids to reduce inflammation.  Per Dr Barbaraann Cao, patient no steroids are needed at this time, he would like to see her next week.  Appointment scheduled on 06/15/23 at 9:00.  Michelle verbalizes understanding.

## 2023-06-15 ENCOUNTER — Encounter: Payer: Self-pay | Admitting: Internal Medicine

## 2023-06-15 ENCOUNTER — Inpatient Hospital Stay: Payer: Medicare Other | Attending: Internal Medicine | Admitting: Internal Medicine

## 2023-06-15 VITALS — BP 104/61 | HR 74 | Temp 97.7°F | Resp 18

## 2023-06-15 DIAGNOSIS — Z923 Personal history of irradiation: Secondary | ICD-10-CM | POA: Insufficient documentation

## 2023-06-15 DIAGNOSIS — C711 Malignant neoplasm of frontal lobe: Secondary | ICD-10-CM | POA: Insufficient documentation

## 2023-06-15 DIAGNOSIS — Z9221 Personal history of antineoplastic chemotherapy: Secondary | ICD-10-CM | POA: Insufficient documentation

## 2023-06-15 DIAGNOSIS — G40909 Epilepsy, unspecified, not intractable, without status epilepticus: Secondary | ICD-10-CM | POA: Insufficient documentation

## 2023-06-15 MED ORDER — DEXAMETHASONE 4 MG PO TABS: 4.0000 mg | ORAL_TABLET | Freq: Three times a day (TID) | ORAL | 1 refills | Status: DC

## 2023-06-15 NOTE — Progress Notes (Signed)
[facility] at [facility] [address]  [place], [place] [address] [phone]   Interval Evaluation  Date of Service: 06/15/23 [name] Patient MRN: [id number] Patient DOB: [birth year] Provider: Henreitta Leber, MD  Identifying Statement:  Jennifer [name] is a 67 y.o. female with left frontal glioblastoma   Oncologic History: Oncology History  Glioblastoma multiforme of frontal lobe (HCC)  04/11/2022 Surgery   Craniotomy, resection of left frontal mass with Dr. Franky Macho; path is glioblastoma IDHwt.   05/12/2022 - 06/23/2022 Radiation Therapy   IMRT and concurrent Temodar 75mg /m2   07/21/2022 - 03/31/2023 Chemotherapy   Completes 7 cycles of 5-day Temodar   04/23/2023 Progression   Progression of disease   05/06/2023 Surgery   Second craniotomy, resection with Dr. Franky Macho     Biomarkers:  MGMT Unknown.  IDH 1/2 Wild type.  EGFR Unknown  TERT Unknown   Interval History: Jennifer [name] presents today for follow up, following additional focal seizure this past week.  Family describes improvement in right sided weakness a day or so after the event on 10/14.  Later in the evening of 10/15, however, she began to decline again.  No second seizure event was witnessed.  She continued to have severe weakness all week, today she is not using the right arm or leg at all and is in a wheelchair.  Vimpat had been increased to 100mg  BID, continues on Topamax 50mg  BID as well.  Decadron remains off.  Temodar cycle #8 was dosed from 10/8 through 10/13.  H+P (05/05/22) Patient presented to neurologic attention in mid-August 2023 with new onset difficulty speaking, confusion noticed by family.  She and her family described sudden onset speech impairment and confusion in the afternoon of the 16th, after having been seen in normal state of health that morning.  CNS imaging demonstrated an enhancing left frontal mass, which was resected by Dr. Franky Macho on  04/11/22.  Following surgery she had some speech difficulty, but improved quickly through rehab admission.  At present, she is fully independent with gait, speech, ADLs.  She is off decadron and Keppra.  Retired, had worked at [employer] in [employer].  Today presents with son and daughter in law.    Medications: Current Outpatient Medications on File Prior to Visit  Medication Sig Dispense Refill   amLODipine (NORVASC) 5 MG tablet TAKE 1 TABLET BY MOUTH EVERY DAY (Patient taking differently: Take 5 mg by mouth daily.) 90 tablet 1   atorvastatin (LIPITOR) 10 MG tablet TAKE 1 TABLET BY MOUTH EVERY DAY (Patient taking differently: Take 10 mg by mouth daily.) 90 tablet 1   busPIRone (BUSPAR) 5 MG tablet TAKE 1 TABLET BY MOUTH THREE TIMES A DAY (Patient taking differently: Take 5 mg by mouth 3 (three) times daily.) 270 tablet 1   fluticasone (FLONASE) 50 MCG/ACT nasal spray SPRAY 2 SPRAYS INTO EACH NOSTRIL EVERY DAY 48 mL 0   lacosamide 100 MG TABS Take 1 tablet (100 mg total) by mouth 2 (two) times daily. 60 tablet 2   LORazepam (ATIVAN) 2 MG tablet Take 1 tablet (2 mg total) by mouth every 8 (eight) hours as needed for seizure. 10 tablet 0   losartan (COZAAR) 50 MG tablet TAKE 1 TABLET BY MOUTH EVERY DAY (Patient taking differently: Take 50 mg by mouth daily.) 90 tablet 1   ondansetron (ZOFRAN) 8 MG tablet Take 1 tablet (8 mg total) by mouth every 8 (eight) hours as needed for nausea  or vomiting. May take 30-60 minutes prior to Temodar administration if nausea/vomiting occurs as needed. (Patient not taking: Reported on 05/14/2023) 30 tablet 1   prochlorperazine (COMPAZINE) 10 MG tablet Take 1 tablet (10 mg total) by mouth every 6 (six) hours as needed for nausea or vomiting. 30 tablet 1   SUMAtriptan (IMITREX) 50 MG tablet Take 50 mg by mouth every 2 (two) hours as needed for migraine. May repeat in 2 hours if headache persists or recurs. (Patient not taking: Reported on 05/14/2023)     temozolomide (TEMODAR)  250 MG capsule Take 1 capsule (250 mg total) by mouth daily. Take for 5 days on, 23 days off. Repeat every 28 days. May take on an empty stomach to decrease nausea & vomiting. 5 capsule 0   topiramate (TOPAMAX) 50 MG tablet TAKE 1 TABLET BY MOUTH TWICE A DAY (Patient taking differently: Take 50 mg by mouth 2 (two) times daily.) 60 tablet 0   traZODone (DESYREL) 50 MG tablet Take 1 tablet (50 mg total) by mouth at bedtime as needed for sleep. 30 tablet 2   No current facility-administered medications on file prior to visit.    Allergies: No Known Allergies Past Medical History:  Past Medical History:  Diagnosis Date   Brain cancer (HCC)    Fibromyalgia    Hyperlipidemia    Hypertension    Migraine    Seizures (HCC)    Past Surgical History:  Past Surgical History:  Procedure Laterality Date   APPLICATION OF CRANIAL NAVIGATION N/A 04/11/2022   Procedure: APPLICATION OF CRANIAL NAVIGATION;  Surgeon: Coletta Memos, MD;  Location: [facility] OR;  Service: Neurosurgery;  Laterality: N/A;   APPLICATION OF CRANIAL NAVIGATION Left 05/06/2023   Procedure: APPLICATION OF CRANIAL NAVIGATION;  Surgeon: Coletta Memos, MD;  Location: [facility] OR;  Service: Neurosurgery;  Laterality: Left;   COLONOSCOPY     CRANIOTOMY Left 04/11/2022   Procedure: LEFT FRONTAL CRANIOTOMY FOR RESECTION OF TUMOR WITH [name];  Surgeon: Coletta Memos, MD;  Location: [facility] OR;  Service: Neurosurgery;  Laterality: Left;  RM 21   CRANIOTOMY Left 05/06/2023   Procedure: REDO LEFT CRANIOTOMY FOR TUMOR WITH STEALTH;  Surgeon: Coletta Memos, MD;  Location[facility] OR;  Service: Neurosurgery;  Laterality: Left;   LAPAROSCOPIC APPENDECTOMY  07/15/2012   Procedure: APPENDECTOMY LAPAROSCOPIC;  Surgeon: Liz Malady, MD;  Location: [facility] OR;  Service: General;  Laterality: N/A;  Laparoscopic Appendectomy   Social History:  Social History   Socioeconomic History   Marital status: Widowed    Spouse name: Not on file   Number of children: 2   Years  of education: Not on file   Highest education level: Not on file  Occupational History   Occupation: retail    Comment: [employer] in [place], [place]  Tobacco Use   Smoking status: Never   Smokeless tobacco: Never  Vaping Use   Vaping status: Never Used  Substance and Sexual Activity   Alcohol use: Not Currently    Comment: 2 drinks once every 2-3 months   Drug use: No   Sexual activity: Not Currently    Birth control/protection: Post-menopausal  Other Topics Concern   Not on file  Social History Narrative   Marital status: widowed since 2008; husband was bipolar and alcoholic      Children: 2 children (28, 46); son lives with patient.Daughter lives in [place]/autism/does not drive.  Daughter lives with mother-in-law.  One grandpuppy      Lives: with son  Employment: works at [employer]; also works in International aid/development worker.      Tobacco: none      Alcohol: none; husband was an alcoholic.      Drugs:   none      Exercise:   Social Determinants of Health   Financial Resource Strain: Low Risk  (02/26/2022)   Received from [facility]   Overall Financial Resource Strain (CARDIA)  Food Insecurity: No Food Insecurity (02/26/2022)   Received from [facility]   Hunger Vital Sign  Transportation Needs: No Transportation Needs (02/26/2022)   Received from [facility] - Transportation  Physical Activity: Insufficiently Active (02/26/2022)   Received from [facility]   Exercise Vital Sign  Stress: Stress Concern Present (02/26/2022)   Received from [facility]   [employer] of Occupational Health - Occupational Stress Questionnaire  Social Connections: Socially Isolated (02/26/2022)   Received from [facility]   Social Connection and Isolation Panel [NHANES]  Intimate Partner Violence: Not At Risk (02/26/2022)   Received from [facility] visits prior to 10/25/2022., [facility] visits prior to 10/25/2022.    Humiliation, Afraid, Rape, and Kick questionnaire    Fear of Current or Ex-Partner: No    Emotionally Abused: No    Physically Abused: No    Sexually Abused: No   Family History:  Family History  Adopted: Yes    Review of Systems: Constitutional: Doesn't report fevers, chills or abnormal weight loss Eyes: Doesn't report blurriness of vision Ears, nose, mouth, throat, and face: Doesn't report sore throat Respiratory: Doesn't report cough, dyspnea or wheezes Cardiovascular: Doesn't report palpitation, chest discomfort  Gastrointestinal:  Doesn't report nausea, constipation, diarrhea GU: Doesn't report incontinence Skin: Doesn't report skin rashes Neurological: Per HPI Musculoskeletal: Doesn't report joint pain Behavioral/Psych: Doesn't report anxiety  Physical Exam: Vitals:   06/15/23 0858  BP: 104/61  Pulse: 74  Resp: 18  Temp: 97.7 F (36.5 C)  SpO2: 100%    KPS: 60 General: Alert, cooperative, pleasant, in no acute distress Head: Surgical sutures EENT: No conjunctival injection or scleral icterus.  Lungs: Resp effort normal Cardiac: Regular rate Abdomen: Non-distended abdomen Skin: No rashes cyanosis or petechiae. Extremities: No clubbing or edema  Neurologic Exam: Mental Status: Awake, alert, attentive to examiner. Oriented to self and environment. Language is densely impaired with regards to fluency.  Pseudobulbar elements. Cranial Nerves: Visual acuity is grossly normal. Visual fields are full. Extra-ocular movements intact. No ptosis. Face is symmetric Motor: Tone and bulk are normal. Power is 1/5 in right arm and leg. Reflexes are symmetric, no pathologic reflexes present.  Sensory: Intact to light touch Gait: Hemiparetic, non ambulatory   Labs: I have reviewed the data as listed    Component Value Date/Time   NA 137 05/06/2023 1757   NA 140 10/04/2018 1703   K 3.8 05/06/2023 1757   CL 104 04/28/2023 0740   CO2 21 (L) 04/28/2023 0740   GLUCOSE 143  (H) 04/28/2023 0740   BUN 27 (H) 04/28/2023 0740   BUN 24 10/04/2018 1703   CREATININE 1.08 (H) 05/06/2023 2124   CREATININE 1.29 (H) 03/31/2023 1113   CREATININE 1.06 (H) 01/29/2016 1518   CALCIUM 9.4 04/28/2023 0740   PROT 5.3 (L) 04/26/2023 0449   PROT 6.8 10/04/2018 1703   ALBUMIN 3.1 (L) 04/26/2023 0449   ALBUMIN 4.6 10/04/2018 1703   AST 17 04/26/2023 0449   AST 20 03/31/2023 1113   ALT 24  04/26/2023 0449   ALT 24 03/31/2023 1113   ALKPHOS 49 04/26/2023 0449   BILITOT 0.6 04/26/2023 0449   BILITOT 0.5 03/31/2023 1113   GFRNONAA 57 (L) 05/06/2023 2124   GFRNONAA 46 (L) 03/31/2023 1113   GFRNONAA 58 (L) 01/29/2016 1518   GFRAA 70 10/04/2018 1703   GFRAA 67 01/29/2016 1518   Lab Results  Component Value Date   WBC 20.8 (H) 05/06/2023   NEUTROABS 6.9 04/26/2023   HGB 12.5 05/06/2023   HCT 36.7 05/06/2023   MCV 98.4 05/06/2023   PLT 152 05/06/2023     Assessment/Plan Glioblastoma multiforme of frontal lobe (HCC)  Seizure disorder (HCC)  Jennifer [name] is clinically progressive today after recent breakthrough seizure.  Motor deficits on the right have progressed considerably.  Etiology is unclear; could be ongoing subclinical seizures with post/inter-ictal Todd's paralysis, ongoing localized inflammation from CNS treatment and tumor burden, or progression of organic tumor itself.  A combination of these is also possible.  We recommended short trial of high dose dexamethasone, starting with 8mg  NOW dose, then 4mg  TID thereafter.  If motor function not improved within 2-3 days, this increases the likelihood of either occult focal seizures or rapid progression of tumor.  In this case, we would recommend ED evaluation for likely admission, MRI brain and EEG monitoring.   For now we will keep current MRI on the books, scheduled for 06/25/23.    We also recommended she continue Vimpat 10mg  BID and Topiramate 50mg  BID for now.  We will touch base with her and her family via  phone later this week to evaluate response to therapy, if inpatient course is not needed by then.  All questions were answered. The patient knows to call the clinic with any problems, questions or concerns. No barriers to learning were detected.  The total time spent in the encounter was 40 minutes and more than 50% was on counseling and review of test results   Henreitta Leber, MD Medical Director of Neuro-Oncology [facility] at [facility] 06/15/23 8:56 AM

## 2023-06-18 ENCOUNTER — Telehealth: Payer: Self-pay | Admitting: *Deleted

## 2023-06-18 NOTE — Telephone Encounter (Signed)
PC received from patient's son - he states patient is doing much better since starting decadron, she is now walking with a walker independently.  Dr Barbaraann Cao informed.

## 2023-06-25 ENCOUNTER — Ambulatory Visit (HOSPITAL_COMMUNITY)
Admission: RE | Admit: 2023-06-25 | Discharge: 2023-06-25 | Disposition: A | Discharge status: Home / Self Care | Payer: Medicare Other | Source: Ambulatory Visit | Attending: Internal Medicine | Admitting: Internal Medicine

## 2023-06-25 DIAGNOSIS — C711 Malignant neoplasm of frontal lobe: Secondary | ICD-10-CM | POA: Insufficient documentation

## 2023-06-25 MED ORDER — GADOPICLENOL 0.5 MMOL/ML IV SOLN
7.0000 mL | Freq: Once | INTRAVENOUS | Status: AC | PRN
  Administered 2023-06-25: 7 mL via INTRAVENOUS

## 2023-06-26 ENCOUNTER — Other Ambulatory Visit: Payer: Self-pay | Admitting: *Deleted

## 2023-06-26 DIAGNOSIS — C711 Malignant neoplasm of frontal lobe: Secondary | ICD-10-CM

## 2023-06-29 ENCOUNTER — Inpatient Hospital Stay: Payer: Medicare Other | Admitting: Internal Medicine

## 2023-06-29 ENCOUNTER — Other Ambulatory Visit: Payer: Self-pay

## 2023-06-29 ENCOUNTER — Other Ambulatory Visit (HOSPITAL_COMMUNITY): Payer: Self-pay

## 2023-06-29 ENCOUNTER — Inpatient Hospital Stay: Payer: Medicare Other | Attending: Internal Medicine

## 2023-06-29 ENCOUNTER — Encounter: Payer: Self-pay | Admitting: *Deleted

## 2023-06-29 VITALS — BP 119/80 | HR 81 | Temp 98.8°F | Resp 20 | Wt 161.3 lb

## 2023-06-29 DIAGNOSIS — Z923 Personal history of irradiation: Secondary | ICD-10-CM | POA: Insufficient documentation

## 2023-06-29 DIAGNOSIS — R531 Weakness: Secondary | ICD-10-CM | POA: Diagnosis not present

## 2023-06-29 DIAGNOSIS — Z9221 Personal history of antineoplastic chemotherapy: Secondary | ICD-10-CM | POA: Diagnosis not present

## 2023-06-29 DIAGNOSIS — C711 Malignant neoplasm of frontal lobe: Secondary | ICD-10-CM

## 2023-06-29 DIAGNOSIS — Z79899 Other long term (current) drug therapy: Secondary | ICD-10-CM | POA: Insufficient documentation

## 2023-06-29 DIAGNOSIS — G40909 Epilepsy, unspecified, not intractable, without status epilepticus: Secondary | ICD-10-CM

## 2023-06-29 LAB — CBC WITH DIFFERENTIAL (CANCER CENTER ONLY)
Abs Immature Granulocytes: 0.89 10*3/uL — ABNORMAL HIGH (ref 0.00–0.07)
Basophils Absolute: 0.1 10*3/uL (ref 0.0–0.1)
Basophils Relative: 0 %
Eosinophils Absolute: 0 10*3/uL (ref 0.0–0.5)
Eosinophils Relative: 0 %
HCT: 43.7 % (ref 36.0–46.0)
Hemoglobin: 15 g/dL (ref 12.0–15.0)
Immature Granulocytes: 4 %
Lymphocytes Relative: 2 %
Lymphs Abs: 0.5 10*3/uL — ABNORMAL LOW (ref 0.7–4.0)
MCH: 34.2 pg — ABNORMAL HIGH (ref 26.0–34.0)
MCHC: 34.3 g/dL (ref 30.0–36.0)
MCV: 99.5 fL (ref 80.0–100.0)
Monocytes Absolute: 0.9 10*3/uL (ref 0.1–1.0)
Monocytes Relative: 5 %
Neutro Abs: 17.8 10*3/uL — ABNORMAL HIGH (ref 1.7–7.7)
Neutrophils Relative %: 89 %
Platelet Count: 149 10*3/uL — ABNORMAL LOW (ref 150–400)
RBC: 4.39 MIL/uL (ref 3.87–5.11)
RDW: 13.4 % (ref 11.5–15.5)
WBC Count: 20.2 10*3/uL — ABNORMAL HIGH (ref 4.0–10.5)
nRBC: 0 % (ref 0.0–0.2)

## 2023-06-29 LAB — CMP (CANCER CENTER ONLY)
ALT: 38 U/L (ref 0–44)
AST: 13 U/L — ABNORMAL LOW (ref 15–41)
Albumin: 3.8 g/dL (ref 3.5–5.0)
Alkaline Phosphatase: 58 U/L (ref 38–126)
Anion gap: 8 (ref 5–15)
BUN: 25 mg/dL — ABNORMAL HIGH (ref 8–23)
CO2: 23 mmol/L (ref 22–32)
Calcium: 9.3 mg/dL (ref 8.9–10.3)
Chloride: 106 mmol/L (ref 98–111)
Creatinine: 1.03 mg/dL — ABNORMAL HIGH (ref 0.44–1.00)
GFR, Estimated: 60 mL/min — ABNORMAL LOW (ref >60)
Glucose, Bld: 133 mg/dL — ABNORMAL HIGH (ref 70–99)
Potassium: 4.4 mmol/L (ref 3.5–5.1)
Sodium: 137 mmol/L (ref 135–145)
Total Bilirubin: 0.6 mg/dL (ref <1.2)
Total Protein: 6.2 g/dL — ABNORMAL LOW (ref 6.5–8.1)

## 2023-06-29 MED ORDER — OLANZAPINE 5 MG PO TABS
5.0000 mg | ORAL_TABLET | Freq: Every day | ORAL | 0 refills | Status: DC
Start: 2023-06-29 — End: 2023-08-03
  Filled 2023-06-29: qty 5, 5d supply, fill #0

## 2023-06-29 MED ORDER — TEMOZOLOMIDE 250 MG PO CAPS
150.0000 mg/m2/d | ORAL_CAPSULE | Freq: Every day | ORAL | 0 refills | Status: DC
Start: 2023-06-29 — End: 2023-08-03
  Filled 2023-06-29 – 2023-07-03 (×2): qty 5, 5d supply, fill #0

## 2023-06-29 NOTE — Progress Notes (Signed)
[facility] at [facility] [address]  [place], [place] [address] [phone]   Interval Evaluation  Date of Service: 06/29/23 [name] Patient MRN: [id number] Patient DOB: 12-12-1955 Provider: Henreitta Leber, MD  Identifying Statement:  Jennifer [name] is a 67 y.o. female with left frontal glioblastoma   Oncologic History: Oncology History  Glioblastoma multiforme of frontal lobe (HCC)  04/11/2022 Surgery   Craniotomy, resection of left frontal mass with Dr. Franky Macho; path is glioblastoma IDHwt.   05/12/2022 - 06/23/2022 Radiation Therapy   IMRT and concurrent Temodar 75mg /m2   07/21/2022 - 03/31/2023 Chemotherapy   Completes 7 cycles of 5-day Temodar   04/23/2023 Progression   Progression of disease   05/06/2023 Surgery   Second craniotomy, resection with Dr. Franky Macho     Biomarkers:  MGMT Unknown.  IDH 1/2 Wild type.  EGFR Unknown  TERT Unknown   Interval History: Jennifer [name] presents today for follow up after recent MRI brain. No recurrence of seizures.  She describes significant improvement in right sided weakness and speech difficulty since starting there steroids at prior visit.  Walking more or less independently at home at this time, but still with balance issues compared to several months prior, pre-op.  Vimpat continues at 100mg  BID, continues on Topamax 50mg  BID as well.  Decadron currently at 4mg  three times per day.  Temodar cycle #8 was dosed from 10/8 through 10/13.  H+P (05/05/22) Patient presented to neurologic attention in mid-August 2023 with new onset difficulty speaking, confusion noticed by family.  She and her family described sudden onset speech impairment and confusion in the afternoon of the 16th, after having been seen in normal state of health that morning.  CNS imaging demonstrated an enhancing left frontal mass, which was resected by Dr. Franky Macho on 04/11/22.  Following surgery she had some speech  difficulty, but improved quickly through rehab admission.  At present, she is fully independent with gait, speech, ADLs.  She is off decadron and Keppra.  Retired, had worked at [employer] in [employer].  Today presents with son and daughter in law.    Medications: Current Outpatient Medications on File Prior to Visit  Medication Sig Dispense Refill   amLODipine (NORVASC) 5 MG tablet TAKE 1 TABLET BY MOUTH EVERY DAY (Patient taking differently: Take 5 mg by mouth daily.) 90 tablet 1   atorvastatin (LIPITOR) 10 MG tablet TAKE 1 TABLET BY MOUTH EVERY DAY (Patient taking differently: Take 10 mg by mouth daily.) 90 tablet 1   busPIRone (BUSPAR) 5 MG tablet TAKE 1 TABLET BY MOUTH THREE TIMES A DAY (Patient taking differently: Take 5 mg by mouth 3 (three) times daily.) 270 tablet 1   dexamethasone (DECADRON) 4 MG tablet Take 1 tablet (4 mg total) by mouth 3 (three) times daily. 90 tablet 1   fluticasone (FLONASE) 50 MCG/ACT nasal spray SPRAY 2 SPRAYS INTO EACH NOSTRIL EVERY DAY 48 mL 0   lacosamide 100 MG TABS Take 1 tablet (100 mg total) by mouth 2 (two) times daily. 60 tablet 2   LORazepam (ATIVAN) 2 MG tablet Take 1 tablet (2 mg total) by mouth every 8 (eight) hours as needed for seizure. 10 tablet 0   losartan (COZAAR) 50 MG tablet TAKE 1 TABLET BY MOUTH EVERY DAY (Patient taking differently: Take 50 mg by mouth daily.) 90 tablet 1   ondansetron (ZOFRAN) 8 MG tablet Take 1 tablet (8 mg total) by mouth every 8 (eight) hours  as needed for nausea or vomiting. May take 30-60 minutes prior to Temodar administration if nausea/vomiting occurs as needed. 30 tablet 1   prochlorperazine (COMPAZINE) 10 MG tablet Take 1 tablet (10 mg total) by mouth every 6 (six) hours as needed for nausea or vomiting. 30 tablet 1   SUMAtriptan (IMITREX) 50 MG tablet Take 50 mg by mouth every 2 (two) hours as needed for migraine. May repeat in 2 hours if headache persists or recurs.     temozolomide (TEMODAR) 250 MG capsule Take 1  capsule (250 mg total) by mouth daily. Take for 5 days on, 23 days off. Repeat every 28 days. May take on an empty stomach to decrease nausea & vomiting. 5 capsule 0   topiramate (TOPAMAX) 50 MG tablet TAKE 1 TABLET BY MOUTH TWICE A DAY (Patient taking differently: Take 50 mg by mouth 2 (two) times daily.) 60 tablet 0   traZODone (DESYREL) 50 MG tablet Take 1 tablet (50 mg total) by mouth at bedtime as needed for sleep. 30 tablet 2   No current facility-administered medications on file prior to visit.    Allergies: No Known Allergies Past Medical History:  Past Medical History:  Diagnosis Date   Brain cancer (HCC)    Fibromyalgia    Hyperlipidemia    Hypertension    Migraine    Seizures (HCC)    Past Surgical History:  Past Surgical History:  Procedure Laterality Date   APPLICATION OF CRANIAL NAVIGATION N/A 04/11/2022   Procedure: APPLICATION OF CRANIAL NAVIGATION;  Surgeon: Coletta Memos, MD;  Location: [facility] OR;  Service: Neurosurgery;  Laterality: N/A;   APPLICATION OF CRANIAL NAVIGATION Left 05/06/2023   Procedure: APPLICATION OF CRANIAL NAVIGATION;  Surgeon: Coletta Memos, MD;  Location: [facility] OR;  Service: Neurosurgery;  Laterality: Left;   COLONOSCOPY     CRANIOTOMY Left 04/11/2022   Procedure: LEFT FRONTAL CRANIOTOMY FOR RESECTION OF TUMOR WITH [name];  Surgeon: Coletta Memos, MD;  Location: [facility] OR;  Service: Neurosurgery;  Laterality: Left;  RM 21   CRANIOTOMY Left 05/06/2023   Procedure: REDO LEFT CRANIOTOMY FOR TUMOR WITH STEALTH;  Surgeon: Coletta Memos, MD;  Location: [facility] OR;  Service: Neurosurgery;  Laterality: Left;   LAPAROSCOPIC APPENDECTOMY  07/15/2012   Procedure: APPENDECTOMY LAPAROSCOPIC;  Surgeon: Liz Malady, MD;  Location: [facility] OR;  Service: General;  Laterality: N/A;  Laparoscopic Appendectomy   Social History:  Social History   Socioeconomic History   Marital status: Widowed    Spouse name: Not on file   Number of children: 2   Years of education: Not on  file   Highest education level: Not on file  Occupational History   Occupation: retail    Comment: [employer] in [place], [place]  Tobacco Use   Smoking status: Never   Smokeless tobacco: Never  Vaping Use   Vaping status: Never Used  Substance and Sexual Activity   Alcohol use: Not Currently    Comment: 2 drinks once every 2-3 months   Drug use: No   Sexual activity: Not Currently    Birth control/protection: Post-menopausal  Other Topics Concern   Not on file  Social History Narrative   Marital status: widowed since 2008; husband was bipolar and alcoholic      Children: 2 children (28, 48); son lives with patient.Daughter lives in [place]/autism/does not drive.  Daughter lives with mother-in-law.  One grandpuppy      Lives: with son      Employment: works at [employer]; also works  in [place]/[employer] business.      Tobacco: none      Alcohol: none; husband was an alcoholic.      Drugs:   none      Exercise:   Social Determinants of Health   Financial Resource Strain: Low Risk  (02/26/2022)   Received from [facility]   Overall Financial Resource Strain (CARDIA)  Food Insecurity: No Food Insecurity (02/26/2022)   Received from [facility]   Hunger Vital Sign  Transportation Needs: No Transportation Needs (02/26/2022)   Received from [facility] - Transportation  Physical Activity: Insufficiently Active (02/26/2022)   Received from [facility]   Exercise Vital Sign  Stress: Stress Concern Present (02/26/2022)   Received from [facility]   [employer] of Occupational Health - Occupational Stress Questionnaire  Social Connections: Socially Isolated (02/26/2022)   Received from [facility]   Social Connection and Isolation Panel [NHANES]  Intimate Partner Violence: Not At Risk (02/26/2022)   Received from [facility] visits prior to 10/25/2022., [facility] visits prior to 10/25/2022.   Humiliation, Afraid, Rape,  and Kick questionnaire    Fear of Current or Ex-Partner: No    Emotionally Abused: No    Physically Abused: No    Sexually Abused: No   Family History:  Family History  Adopted: Yes    Review of Systems: Constitutional: Doesn't report fevers, chills or abnormal weight loss Eyes: Doesn't report blurriness of vision Ears, nose, mouth, throat, and face: Doesn't report sore throat Respiratory: Doesn't report cough, dyspnea or wheezes Cardiovascular: Doesn't report palpitation, chest discomfort  Gastrointestinal:  Doesn't report nausea, constipation, diarrhea GU: Doesn't report incontinence Skin: Doesn't report skin rashes Neurological: Per HPI Musculoskeletal: Doesn't report joint pain Behavioral/Psych: Doesn't report anxiety  Physical Exam: Vitals:   06/29/23 0845  BP: 119/80  Pulse: 81  Resp: 20  Temp: 98.8 F (37.1 C)  SpO2: 100%    KPS: 70 General: Alert, cooperative, pleasant, in no acute distress Head: Surgical sutures EENT: No conjunctival injection or scleral icterus.  Lungs: Resp effort normal Cardiac: Regular rate Abdomen: Non-distended abdomen Skin: No rashes cyanosis or petechiae. Extremities: No clubbing or edema  Neurologic Exam: Mental Status: Awake, alert, attentive to examiner. Oriented to self and environment. Language is modestly impaired with regards to fluency.  Pseudobulbar elements. Cranial Nerves: Visual acuity is grossly normal. Visual fields are full. Extra-ocular movements intact. No ptosis. Face is symmetric Motor: Tone and bulk are normal. Power is 4+/5 in right arm and leg. Reflexes are symmetric, no pathologic reflexes present.  Sensory: Intact to light touch Gait: Hemiparetic, non ambulatory   Labs: I have reviewed the data as listed    Component Value Date/Time   NA 137 05/06/2023 1757   NA 140 10/04/2018 1703   K 3.8 05/06/2023 1757   CL 104 04/28/2023 0740   CO2 21 (L) 04/28/2023 0740   GLUCOSE 143 (H) 04/28/2023 0740   BUN  27 (H) 04/28/2023 0740   BUN 24 10/04/2018 1703   CREATININE 1.08 (H) 05/06/2023 2124   CREATININE 1.29 (H) 03/31/2023 1113   CREATININE 1.06 (H) 01/29/2016 1518   CALCIUM 9.4 04/28/2023 0740   PROT 5.3 (L) 04/26/2023 0449   PROT 6.8 10/04/2018 1703   ALBUMIN 3.1 (L) 04/26/2023 0449   ALBUMIN 4.6 10/04/2018 1703   AST 17 04/26/2023 0449   AST 20 03/31/2023 1113   ALT 24 04/26/2023 0449   ALT 24  03/31/2023 1113   ALKPHOS 49 04/26/2023 0449   BILITOT 0.6 04/26/2023 0449   BILITOT 0.5 03/31/2023 1113   GFRNONAA 57 (L) 05/06/2023 2124   GFRNONAA 46 (L) 03/31/2023 1113   GFRNONAA 58 (L) 01/29/2016 1518   GFRAA 70 10/04/2018 1703   GFRAA 67 01/29/2016 1518   Lab Results  Component Value Date   WBC 20.2 (H) 06/29/2023   NEUTROABS 17.8 (H) 06/29/2023   HGB 15.0 06/29/2023   HCT 43.7 06/29/2023   MCV 99.5 06/29/2023   PLT 149 (L) 06/29/2023    Imaging:  [facility] Clinician Interpretation: I have personally reviewed the CNS images as listed.  My interpretation, in the context of the patient's clinical presentation, is treatment effect vs true progression  MR BRAIN W WO CONTRAST  Result Date: 06/25/2023 CLINICAL DATA:  Brain/CNS neoplasm, assess treatment response EXAM: MRI HEAD WITHOUT AND WITH CONTRAST TECHNIQUE: Multiplanar, multiecho pulse sequences of the brain and surrounding structures were obtained without and with intravenous contrast. CONTRAST:  7 ml Gadavist COMPARISON:  Brain MRI 05/07/2023, brain MR 05/04/2023 FINDINGS: Brain: Left frontal craniotomy with subjacent resection cavity redemonstrated. Compared to prior exam there is interval resolution of pneumocephalus and slight interval decrease in size of the resection cavity. There is persistent nodular contrast enhancement along the along the anterior superior aspect of the resection cavity measuring 1.6 x 1.0 cm, unchanged from prior exam. There is an additional region of nodular contrast enhancement along the anterior  inferior aspect of the resection cavity (series 16, image 110), which is new from prior exam. The extent of T2/FLAIR hyperintense signal abnormality has slightly increased from prior exam along the inferolateral left aspect of the resection cavity (series 9, image 23). There is no significant midline shift. Negative for an acute infarct. No hemorrhage. No hydrocephalus. Compared to prior exam there is new hyperintense signal on diffusion-weighted imaging within the resection cavity and along the craniotomy site. This is favored to represent evolving blood products. Vascular: Normal flow voids. Skull and upper cervical spine: Normal marrow signal. Sinuses/Orbits: No middle ear or mastoid effusion. Paranasal sinuses are clear. Orbits are unremarkable Other: None. IMPRESSION: 1. Persistent nodular contrast enhancement along the anterior superior aspect of the resection cavity, unchanged from prior exam. 2. New region of nodular contrast enhancement along the anterior inferior aspect of the resection cavity. This is nonspecific and could represent evolving post-surgical change or residual/recurrent tumor. 3. The extent of T2/FLAIR hyperintense signal abnormality along the inferolateral left aspect of the resection cavity has slightly increased from prior exam. This could represent evolving post-surgical change. Recommend attention on follow-up. Electronically Signed   By: Lorenza Cambridge M.D.   On: 06/25/2023 18:15     Assessment/Plan Glioblastoma multiforme of frontal lobe (HCC)  Seizure disorder (HCC)  STEFAN KAREN is clinically improved today with regards to language and motor deficits.  MRI brain demonstrates increase in thickness of enhancement peripheral to resection cavity, c/w post-surgical change vs progression of disease along dura.  We will keep close watch on these changes, which are presently asymptomatic.  We recommended initiating treatment with cycle #9 Temozolomide 150mg /m2, on for five days  and off for twenty three days in twenty eight day cycles. The patient will have a complete blood count performed on days 21 and 28 of each cycle, and a comprehensive metabolic panel performed on day 28 of each cycle. Labs may need to be performed more often. Zofran will prescribed for home use for nausea/vomiting.   Informed  consent was obtained verbally at bedside to proceed with oral chemotherapy.  Chemotherapy should be held for the following:  ANC less than 1,000  Platelets less than 100,000  LFT or creatinine greater than 2x ULN  If clinical concerns/contraindications develop  For refractory nausea, recommended trial of zyprexa 5mg  HS in addition to her PRN compazine.  Decadron should decrease to 4mg  BID x3-4 days, then 4mg  daily thereafter.    We also recommended she continue Vimpat 10mg  BID and Topiramate 50mg  BID for now.  Also recommended resuming PT/OT given right sided weakness, dystaxia not fully resolved.  We ask that Mont Dutton return to clinic in 1 months prior to cycle #10 with labs, or sooner as needed.  Next MRI brain in 2 months.  All questions were answered. The patient knows to call the clinic with any problems, questions or concerns. No barriers to learning were detected.  The total time spent in the encounter was 40 minutes and more than 50% was on counseling and review of test results   Henreitta Leber, MD Medical Director of Neuro-Oncology [facility] at [facility] 06/29/23 8:58 AM

## 2023-06-29 NOTE — Progress Notes (Signed)
Called [facility] [phone] to see if they could accept referral for PT/OT since they provided services in the past.  They already had her active and she was recertified from the hospital referral they had.

## 2023-07-02 ENCOUNTER — Other Ambulatory Visit: Payer: Self-pay

## 2023-07-03 ENCOUNTER — Other Ambulatory Visit (HOSPITAL_COMMUNITY): Payer: Self-pay

## 2023-07-03 ENCOUNTER — Other Ambulatory Visit: Payer: Self-pay

## 2023-07-03 ENCOUNTER — Encounter: Payer: Self-pay | Admitting: Internal Medicine

## 2023-07-03 NOTE — Progress Notes (Signed)
Specialty Pharmacy Refill Coordination Note  Jennifer [name] is a 67 y.o. female contacted today regarding refills of specialty medication(s) Temozolomide   Patient requested Delivery   Delivery date: 07/06/23   Verified address: [address], [place], [address]   Medication will be filled on 07/03/23.

## 2023-07-04 ENCOUNTER — Telehealth: Payer: Self-pay | Admitting: Internal Medicine

## 2023-07-04 NOTE — Telephone Encounter (Signed)
Scheduled per 11/04 los, patient has been called and voicemail was left.

## 2023-07-22 ENCOUNTER — Other Ambulatory Visit: Payer: Self-pay

## 2023-07-30 ENCOUNTER — Other Ambulatory Visit (HOSPITAL_COMMUNITY): Payer: Self-pay

## 2023-07-31 ENCOUNTER — Other Ambulatory Visit: Payer: Self-pay

## 2023-07-31 DIAGNOSIS — C711 Malignant neoplasm of frontal lobe: Secondary | ICD-10-CM

## 2023-08-03 ENCOUNTER — Inpatient Hospital Stay (HOSPITAL_BASED_OUTPATIENT_CLINIC_OR_DEPARTMENT_OTHER): Payer: Medicare Other | Admitting: Internal Medicine

## 2023-08-03 ENCOUNTER — Other Ambulatory Visit: Payer: Self-pay

## 2023-08-03 ENCOUNTER — Inpatient Hospital Stay: Payer: Medicare Other | Attending: Internal Medicine

## 2023-08-03 ENCOUNTER — Other Ambulatory Visit (HOSPITAL_COMMUNITY): Payer: Self-pay

## 2023-08-03 VITALS — BP 141/66 | HR 102 | Resp 18 | Wt 163.8 lb

## 2023-08-03 DIAGNOSIS — Z923 Personal history of irradiation: Secondary | ICD-10-CM | POA: Diagnosis not present

## 2023-08-03 DIAGNOSIS — Z79899 Other long term (current) drug therapy: Secondary | ICD-10-CM | POA: Insufficient documentation

## 2023-08-03 DIAGNOSIS — Z7952 Long term (current) use of systemic steroids: Secondary | ICD-10-CM | POA: Insufficient documentation

## 2023-08-03 DIAGNOSIS — C711 Malignant neoplasm of frontal lobe: Secondary | ICD-10-CM | POA: Diagnosis not present

## 2023-08-03 DIAGNOSIS — Z9221 Personal history of antineoplastic chemotherapy: Secondary | ICD-10-CM | POA: Diagnosis not present

## 2023-08-03 DIAGNOSIS — G40909 Epilepsy, unspecified, not intractable, without status epilepticus: Secondary | ICD-10-CM | POA: Diagnosis not present

## 2023-08-03 LAB — CBC WITH DIFFERENTIAL (CANCER CENTER ONLY)
Abs Immature Granulocytes: 0.74 10*3/uL — ABNORMAL HIGH (ref 0.00–0.07)
Basophils Absolute: 0.1 10*3/uL (ref 0.0–0.1)
Basophils Relative: 0 %
Eosinophils Absolute: 0 10*3/uL (ref 0.0–0.5)
Eosinophils Relative: 0 %
HCT: 40.9 % (ref 36.0–46.0)
Hemoglobin: 13.9 g/dL (ref 12.0–15.0)
Immature Granulocytes: 5 %
Lymphocytes Relative: 4 %
Lymphs Abs: 0.6 10*3/uL — ABNORMAL LOW (ref 0.7–4.0)
MCH: 35.6 pg — ABNORMAL HIGH (ref 26.0–34.0)
MCHC: 34 g/dL (ref 30.0–36.0)
MCV: 104.9 fL — ABNORMAL HIGH (ref 80.0–100.0)
Monocytes Absolute: 0.4 10*3/uL (ref 0.1–1.0)
Monocytes Relative: 3 %
Neutro Abs: 13.6 10*3/uL — ABNORMAL HIGH (ref 1.7–7.7)
Neutrophils Relative %: 88 %
Platelet Count: 169 10*3/uL (ref 150–400)
RBC: 3.9 MIL/uL (ref 3.87–5.11)
RDW: 14.2 % (ref 11.5–15.5)
WBC Count: 15.4 10*3/uL — ABNORMAL HIGH (ref 4.0–10.5)
nRBC: 0 % (ref 0.0–0.2)

## 2023-08-03 LAB — CMP (CANCER CENTER ONLY)
ALT: 39 U/L (ref 0–44)
AST: 19 U/L (ref 15–41)
Albumin: 4.1 g/dL (ref 3.5–5.0)
Alkaline Phosphatase: 55 U/L (ref 38–126)
Anion gap: 7 (ref 5–15)
BUN: 24 mg/dL — ABNORMAL HIGH (ref 8–23)
CO2: 26 mmol/L (ref 22–32)
Calcium: 9.4 mg/dL (ref 8.9–10.3)
Chloride: 106 mmol/L (ref 98–111)
Creatinine: 1.18 mg/dL — ABNORMAL HIGH (ref 0.44–1.00)
GFR, Estimated: 51 mL/min — ABNORMAL LOW (ref >60)
Glucose, Bld: 126 mg/dL — ABNORMAL HIGH (ref 70–99)
Potassium: 4 mmol/L (ref 3.5–5.1)
Sodium: 139 mmol/L (ref 135–145)
Total Bilirubin: 0.7 mg/dL (ref <1.2)
Total Protein: 6.6 g/dL (ref 6.5–8.1)

## 2023-08-03 MED ORDER — DEXAMETHASONE 1 MG PO TABS
ORAL_TABLET | ORAL | 0 refills | Status: AC
Start: 2023-08-03 — End: 2023-08-23

## 2023-08-03 MED ORDER — OLANZAPINE 5 MG PO TABS: 5.0000 mg | ORAL_TABLET | Freq: Every day | ORAL | 0 refills | Status: DC

## 2023-08-03 MED ORDER — TEMOZOLOMIDE 250 MG PO CAPS
150.0000 mg/m2/d | ORAL_CAPSULE | Freq: Every day | ORAL | 0 refills | Status: DC
  Filled 2023-08-03: qty 5, 5d supply, fill #0
  Filled 2023-08-03: qty 5, 28d supply, fill #0

## 2023-08-03 NOTE — Progress Notes (Signed)
Specialty Pharmacy Refill Coordination Note  Jennifer [name] is a 67 y.o. female contacted today regarding refills of specialty medication(s) Temozolomide   Patient requested Delivery   Delivery date: 08/05/23   Verified address: [address]   [place] [id number]   Medication will be filled on 08/04/23.

## 2023-08-03 NOTE — Progress Notes (Signed)
[facility] at [facility] [address]  [place], [place] [address] [phone]   Interval Evaluation  Date of Service: 08/03/23 [name] Patient MRN: [id number] Patient DOB: September 17, 1955 Provider: Henreitta Leber, MD  Identifying Statement:  Jennifer [name] is a 68 y.o. female with left frontal glioblastoma   Oncologic History: Oncology History  Glioblastoma multiforme of frontal lobe (HCC)  04/11/2022 Surgery   Craniotomy, resection of left frontal mass with Dr. Franky Macho; path is glioblastoma IDHwt.   05/12/2022 - 06/23/2022 Radiation Therapy   IMRT and concurrent Temodar 75mg /m2   07/21/2022 - 03/31/2023 Chemotherapy   Completes 7 cycles of 5-day Temodar   04/23/2023 Progression   Progression of disease   05/06/2023 Surgery   Second craniotomy, resection with Dr. Franky Macho     Biomarkers:  MGMT Unknown.  IDH 1/2 Wild type.  EGFR Unknown  TERT Unknown   Interval History: Jennifer [name] presents today for follow up after completing cycle #9 5-day TMZ.  Zyprexa was more effective for nausea prevention this cycle.  No recurrence of seizures, nor change in right sided weakness and speech difficulty.  Walking more or less independently at home at this time, but still with balance issues compared to several months prior, pre-op.  Vimpat continues at 100mg  BID, continues on Topamax 50mg  BID as well.  Decadron currently at 4mg  daily.  Temodar cycle #9 was dosed from 11/11 through 11/15.  H+P (05/05/22) Patient presented to neurologic attention in mid-August 2023 with new onset difficulty speaking, confusion noticed by family.  She and her family described sudden onset speech impairment and confusion in the afternoon of the 16th, after having been seen in normal state of health that morning.  CNS imaging demonstrated an enhancing left frontal mass, which was resected by Dr. Franky Macho on 04/11/22.  Following surgery she had some speech difficulty,  but improved quickly through rehab admission.  At present, she is fully independent with gait, speech, ADLs.  She is off decadron and Keppra.  Retired, had worked at [employer] in [employer].  Today presents with son and daughter in law.    Medications: Current Outpatient Medications on File Prior to Visit  Medication Sig Dispense Refill   amLODipine (NORVASC) 5 MG tablet TAKE 1 TABLET BY MOUTH EVERY DAY (Patient taking differently: Take 5 mg by mouth daily.) 90 tablet 1   atorvastatin (LIPITOR) 10 MG tablet TAKE 1 TABLET BY MOUTH EVERY DAY (Patient taking differently: Take 10 mg by mouth daily.) 90 tablet 1   busPIRone (BUSPAR) 5 MG tablet TAKE 1 TABLET BY MOUTH THREE TIMES A DAY (Patient taking differently: Take 5 mg by mouth 3 (three) times daily.) 270 tablet 1   dexamethasone (DECADRON) 4 MG tablet Take 1 tablet (4 mg total) by mouth 3 (three) times daily. 90 tablet 1   fluticasone (FLONASE) 50 MCG/ACT nasal spray SPRAY 2 SPRAYS INTO EACH NOSTRIL EVERY DAY 48 mL 0   lacosamide 100 MG TABS Take 1 tablet (100 mg total) by mouth 2 (two) times daily. 60 tablet 2   LORazepam (ATIVAN) 2 MG tablet Take 1 tablet (2 mg total) by mouth every 8 (eight) hours as needed for seizure. 10 tablet 0   losartan (COZAAR) 50 MG tablet TAKE 1 TABLET BY MOUTH EVERY DAY (Patient taking differently: Take 50 mg by mouth daily.) 90 tablet 1   OLANZapine (ZYPREXA) 5 MG tablet Take 1 tablet (5 mg total) by mouth at bedtime. Take during  5 nights of chemotherapy only 5 tablet 0   ondansetron (ZOFRAN) 8 MG tablet Take 1 tablet (8 mg total) by mouth every 8 (eight) hours as needed for nausea or vomiting. May take 30-60 minutes prior to Temodar administration if nausea/vomiting occurs as needed. 30 tablet 1   prochlorperazine (COMPAZINE) 10 MG tablet Take 1 tablet (10 mg total) by mouth every 6 (six) hours as needed for nausea or vomiting. 30 tablet 1   SUMAtriptan (IMITREX) 50 MG tablet Take 50 mg by mouth every 2 (two) hours as  needed for migraine. May repeat in 2 hours if headache persists or recurs.     temozolomide (TEMODAR) 250 MG capsule Take 1 capsule (250 mg total) by mouth daily. Take for 5 days on, 23 days off. Repeat every 28 days. May take on an empty stomach to decrease nausea & vomiting. 5 capsule 0   topiramate (TOPAMAX) 50 MG tablet TAKE 1 TABLET BY MOUTH TWICE A DAY (Patient taking differently: Take 50 mg by mouth 2 (two) times daily.) 60 tablet 0   traZODone (DESYREL) 50 MG tablet Take 1 tablet (50 mg total) by mouth at bedtime as needed for sleep. 30 tablet 2   No current facility-administered medications on file prior to visit.    Allergies: No Known Allergies Past Medical History:  Past Medical History:  Diagnosis Date   Brain cancer (HCC)    Fibromyalgia    Hyperlipidemia    Hypertension    Migraine    Seizures (HCC)    Past Surgical History:  Past Surgical History:  Procedure Laterality Date   APPLICATION OF CRANIAL NAVIGATION N/A 04/11/2022   Procedure: APPLICATION OF CRANIAL NAVIGATION;  Surgeon: Coletta Memos, MD;  Location: [facility] OR;  Service: Neurosurgery;  Laterality: N/A;   APPLICATION OF CRANIAL NAVIGATION Left 05/06/2023   Procedure: APPLICATION OF CRANIAL NAVIGATION;  Surgeon: Coletta Memos, MD;  Location: [facility] OR;  Service: Neurosurgery;  Laterality: Left;   COLONOSCOPY     CRANIOTOMY Left 04/11/2022   Procedure: LEFT FRONTAL CRANIOTOMY FOR RESECTION OF TUMOR WITH [name];  Surgeon: Coletta Memos, MD;  Location: [facility] OR;  Service: Neurosurgery;  Laterality: Left;  RM 21   CRANIOTOMY Left 05/06/2023   Procedure: REDO LEFT CRANIOTOMY FOR TUMOR WITH STEALTH;  Surgeon: Coletta Memos, MD;  Location: [facility] OR;  Service: Neurosurgery;  Laterality: Left;   LAPAROSCOPIC APPENDECTOMY  07/15/2012   Procedure: APPENDECTOMY LAPAROSCOPIC;  Surgeon: Liz Malady, MD;  Location: [facility] OR;  Service: General;  Laterality: N/A;  Laparoscopic Appendectomy   Social History:  Social History    Socioeconomic History   Marital status: Widowed    Spouse name: Not on file   Number of children: 2   Years of education: Not on file   Highest education level: Not on file  Occupational History   Occupation: retail    Comment: [employer] in [place], [place]  Tobacco Use   Smoking status: Never   Smokeless tobacco: Never  Vaping Use   Vaping status: Never Used  Substance and Sexual Activity   Alcohol use: Not Currently    Comment: 2 drinks once every 2-3 months   Drug use: No   Sexual activity: Not Currently    Birth control/protection: Post-menopausal  Other Topics Concern   Not on file  Social History Narrative   Marital status: widowed since 2008; husband was bipolar and alcoholic      Children: 2 children (28, 63); son lives with patient.Daughter lives in [place]/autism/does not drive.  Daughter lives with mother-in-law.  One grandpuppy      Lives: with son      Employment: works at [employer]; also works in Corporate investment banker business.      Tobacco: none      Alcohol: none; husband was an alcoholic.      Drugs:   none      Exercise:   Social Determinants of Health   Financial Resource Strain: Low Risk  (02/26/2022)   Received from [facility]   Overall Financial Resource Strain (CARDIA)  Food Insecurity: No Food Insecurity (02/26/2022)   Received from [facility]   Hunger Vital Sign  Transportation Needs: No Transportation Needs (02/26/2022)   Received from [facility] - Transportation  Physical Activity: Insufficiently Active (02/26/2022)   Received from [facility]   Exercise Vital Sign  Stress: Stress Concern Present (02/26/2022)   Received from [facility]   [employer] of Occupational Health - Occupational Stress Questionnaire  Social Connections: Socially Isolated (02/26/2022)   Received from [facility]   Social Connection and Isolation Panel [NHANES]  Intimate Partner Violence: Not At Risk (02/26/2022)   Received from [facility]  [facility] visits prior to 10/25/2022., [facility] visits prior to 10/25/2022.   Humiliation, Afraid, Rape, and Kick questionnaire    Fear of Current or Ex-Partner: No    Emotionally Abused: No    Physically Abused: No    Sexually Abused: No   Family History:  Family History  Adopted: Yes    Review of Systems: Constitutional: Doesn't report fevers, chills or abnormal weight loss Eyes: Doesn't report blurriness of vision Ears, nose, mouth, throat, and face: Doesn't report sore throat Respiratory: Doesn't report cough, dyspnea or wheezes Cardiovascular: Doesn't report palpitation, chest discomfort  Gastrointestinal:  Doesn't report nausea, constipation, diarrhea GU: Doesn't report incontinence Skin: Doesn't report skin rashes Neurological: Per HPI Musculoskeletal: Doesn't report joint pain Behavioral/Psych: Doesn't report anxiety  Physical Exam: Vitals:   08/03/23 1350  BP: (!) 141/66  Pulse: (!) 102  Resp: 18  SpO2: 100%   KPS: 70 General: Alert, cooperative, pleasant, in no acute distress Head: Surgical sutures EENT: No conjunctival injection or scleral icterus.  Lungs: Resp effort normal Cardiac: Regular rate Abdomen: Non-distended abdomen Skin: No rashes cyanosis or petechiae. Extremities: No clubbing or edema  Neurologic Exam: Mental Status: Awake, alert, attentive to examiner. Oriented to self and environment. Language is modestly impaired with regards to fluency.  Pseudobulbar elements. Cranial Nerves: Visual acuity is grossly normal. Visual fields are full. Extra-ocular movements intact. No ptosis. Face is symmetric Motor: Tone and bulk are normal. Power is 4+/5 in right arm and leg. Reflexes are symmetric, no pathologic reflexes present.  Sensory: Intact to light touch Gait: Hemiparetic, non ambulatory   Labs: I have reviewed the data as listed    Component Value Date/Time   NA 137 06/29/2023 0830   NA 140 10/04/2018 1703   K  4.4 06/29/2023 0830   CL 106 06/29/2023 0830   CO2 23 06/29/2023 0830   GLUCOSE 133 (H) 06/29/2023 0830   BUN 25 (H) 06/29/2023 0830   BUN 24 10/04/2018 1703   CREATININE 1.03 (H) 06/29/2023 0830   CREATININE 1.06 (H) 01/29/2016 1518   CALCIUM 9.3 06/29/2023 0830   PROT 6.2 (L) 06/29/2023 0830   PROT 6.8 10/04/2018 1703   ALBUMIN 3.8 06/29/2023 0830   ALBUMIN 4.6 10/04/2018 1703   AST 13 (L) 06/29/2023 0830   ALT  38 06/29/2023 0830   ALKPHOS 58 06/29/2023 0830   BILITOT 0.6 06/29/2023 0830   GFRNONAA 60 (L) 06/29/2023 0830   GFRNONAA 58 (L) 01/29/2016 1518   GFRAA 70 10/04/2018 1703   GFRAA 67 01/29/2016 1518   Lab Results  Component Value Date   WBC 15.4 (H) 08/03/2023   NEUTROABS 13.6 (H) 08/03/2023   HGB 13.9 08/03/2023   HCT 40.9 08/03/2023   MCV 104.9 (H) 08/03/2023   PLT 169 08/03/2023     Assessment/Plan Glioblastoma multiforme of frontal lobe (HCC)  Seizure disorder (HCC)  Jennifer [name] is clinically stable today.  Labs are within normal limits.  We recommended initiating treatment with cycle #10 Temozolomide 150mg /m2, on for five days and off for twenty three days in twenty eight day cycles. The patient will have a complete blood count performed on days 21 and 28 of each cycle, and a comprehensive metabolic panel performed on day 28 of each cycle. Labs may need to be performed more often. Zofran will prescribed for home use for nausea/vomiting.   Informed consent was obtained verbally at bedside to proceed with oral chemotherapy.  Chemotherapy should be held for the following:  ANC less than 1,000  Platelets less than 100,000  LFT or creatinine greater than 2x ULN  If clinical concerns/contraindications develop  For refractory nausea, recommended zyprexa 5mg  HS x5 nights in addition to her PRN compazine.  Recommended decreasing decadron by 1mg  each week, starting with 3mg  daily tomorrow.  Dose may be modified if focal symptoms recur.  We also  recommended she continue Vimpat 10mg  BID and Topiramate 50mg  BID for now.  We ask that Mont Dutton return to clinic in 1 months prior to cycle #11 with MRI brain, or sooner as needed.    All questions were answered. The patient knows to call the clinic with any problems, questions or concerns. No barriers to learning were detected.  The total time spent in the encounter was 30 minutes and more than 50% was on counseling and review of test results   Henreitta Leber, MD Medical Director of Neuro-Oncology [facility] 08/03/23 2:01 PM

## 2023-08-04 ENCOUNTER — Telehealth: Payer: Self-pay | Admitting: Internal Medicine

## 2023-08-04 NOTE — Telephone Encounter (Signed)
Called patient to scheduled 5 week f/u with Dr. Barbaraann Cao appointments. Confirmed scheduled appointments.

## 2023-08-05 ENCOUNTER — Other Ambulatory Visit: Payer: Self-pay

## 2023-08-20 ENCOUNTER — Other Ambulatory Visit (HOSPITAL_COMMUNITY): Payer: Self-pay

## 2023-08-24 ENCOUNTER — Other Ambulatory Visit: Payer: Self-pay | Admitting: Internal Medicine

## 2023-08-24 ENCOUNTER — Telehealth: Payer: Self-pay | Admitting: *Deleted

## 2023-08-24 MED ORDER — LACOSAMIDE 150 MG PO TABS: 150.0000 mg | ORAL_TABLET | Freq: Two times a day (BID) | ORAL | 2 refills | Status: DC

## 2023-08-24 NOTE — Telephone Encounter (Signed)
Communicated response to Daughter in Social worker.  No further questions.

## 2023-08-24 NOTE — Telephone Encounter (Signed)
Patients daughter in law called to report that as instructed patient began her dose reduction of Decadron on 08/18/2023 down to 1 mg.  Patient started acting a little off in the following days and on 08/22/2023 had 2 seizures fairly close together.  First seizure lasting 5 minutes and second lasting approximately 1 minute witnessed by another family member.  That family member did not dose the Ativan since no further seizures occurred.  Daughter in law later decided to bump patient back up to 2 mg of Decadron.  She wanted to communicate this to provider and see if further changes needed to be made.  Routing to Dr Barbaraann Cao.

## 2023-09-01 ENCOUNTER — Telehealth: Payer: Self-pay | Admitting: *Deleted

## 2023-09-01 NOTE — Telephone Encounter (Signed)
 PC to patient's son, Gabriel Rung, informed him of Dr Liana Gerold message below - Shawnae may take dexamethasone 4 mg twice a day until her appointment on 09/07/23.  He verbalizes understanding.

## 2023-09-01 NOTE — Telephone Encounter (Signed)
-----   Message from Zachary K Vaslow sent at 09/01/2023  9:50 AM EST ----- They can do 4mg  BID until I see them next week, if she tolerates this dose.  Zachary K Vaslow, MD ----- Message ----- From: Marget Rudell ORN, RN Sent: 09/01/2023   9:43 AM EST To: Zachary K Vaslow, MD  Tequisha's son, Larnell, called this morning.  He said she is having trouble with her R leg & is dragging her R foot some.  She is also having more difficulty finding words.  The family has increased her dex from 2 mg to 4 mg.  He is asking if you advise increasing this more?  She has a MRI on 1/9 & then sees you on the 13th.  [place]

## 2023-09-03 ENCOUNTER — Ambulatory Visit (HOSPITAL_COMMUNITY)
Admission: RE | Admit: 2023-09-03 | Discharge: 2023-09-03 | Disposition: A | Discharge status: Home / Self Care | Payer: Medicare Other | Source: Ambulatory Visit | Attending: Internal Medicine | Admitting: Internal Medicine

## 2023-09-03 ENCOUNTER — Other Ambulatory Visit (HOSPITAL_COMMUNITY): Payer: Self-pay

## 2023-09-03 DIAGNOSIS — C711 Malignant neoplasm of frontal lobe: Secondary | ICD-10-CM | POA: Diagnosis present

## 2023-09-03 MED ORDER — GADOBUTROL 1 MMOL/ML IV SOLN
7.0000 mL | Freq: Once | INTRAVENOUS | Status: AC | PRN
  Administered 2023-09-03: 7 mL via INTRAVENOUS

## 2023-09-04 ENCOUNTER — Other Ambulatory Visit: Payer: Self-pay | Admitting: Internal Medicine

## 2023-09-04 DIAGNOSIS — C711 Malignant neoplasm of frontal lobe: Secondary | ICD-10-CM

## 2023-09-07 ENCOUNTER — Inpatient Hospital Stay: Payer: Medicare Other | Attending: Internal Medicine

## 2023-09-07 ENCOUNTER — Inpatient Hospital Stay: Payer: Medicare Other | Admitting: Internal Medicine

## 2023-09-07 VITALS — BP 141/87 | HR 65 | Temp 97.7°F | Resp 18 | Wt 166.2 lb

## 2023-09-07 DIAGNOSIS — R569 Unspecified convulsions: Secondary | ICD-10-CM

## 2023-09-07 DIAGNOSIS — Z7952 Long term (current) use of systemic steroids: Secondary | ICD-10-CM | POA: Diagnosis not present

## 2023-09-07 DIAGNOSIS — C711 Malignant neoplasm of frontal lobe: Secondary | ICD-10-CM | POA: Insufficient documentation

## 2023-09-07 DIAGNOSIS — G40909 Epilepsy, unspecified, not intractable, without status epilepticus: Secondary | ICD-10-CM | POA: Diagnosis not present

## 2023-09-07 DIAGNOSIS — Z5112 Encounter for antineoplastic immunotherapy: Secondary | ICD-10-CM | POA: Insufficient documentation

## 2023-09-07 DIAGNOSIS — Z923 Personal history of irradiation: Secondary | ICD-10-CM | POA: Insufficient documentation

## 2023-09-07 DIAGNOSIS — Z79899 Other long term (current) drug therapy: Secondary | ICD-10-CM | POA: Insufficient documentation

## 2023-09-07 LAB — CBC WITH DIFFERENTIAL (CANCER CENTER ONLY)
Abs Immature Granulocytes: 0.78 10*3/uL — ABNORMAL HIGH (ref 0.00–0.07)
Basophils Absolute: 0.1 10*3/uL (ref 0.0–0.1)
Basophils Relative: 1 %
Eosinophils Absolute: 0 10*3/uL (ref 0.0–0.5)
Eosinophils Relative: 0 %
HCT: 44 % (ref 36.0–46.0)
Hemoglobin: 15.3 g/dL — ABNORMAL HIGH (ref 12.0–15.0)
Immature Granulocytes: 4 %
Lymphocytes Relative: 3 %
Lymphs Abs: 0.6 10*3/uL — ABNORMAL LOW (ref 0.7–4.0)
MCH: 34.9 pg — ABNORMAL HIGH (ref 26.0–34.0)
MCHC: 34.8 g/dL (ref 30.0–36.0)
MCV: 100.5 fL — ABNORMAL HIGH (ref 80.0–100.0)
Monocytes Absolute: 0.9 10*3/uL (ref 0.1–1.0)
Monocytes Relative: 5 %
Neutro Abs: 16.6 10*3/uL — ABNORMAL HIGH (ref 1.7–7.7)
Neutrophils Relative %: 87 %
Platelet Count: 259 10*3/uL (ref 150–400)
RBC: 4.38 MIL/uL (ref 3.87–5.11)
RDW: 13.6 % (ref 11.5–15.5)
WBC Count: 19 10*3/uL — ABNORMAL HIGH (ref 4.0–10.5)
nRBC: 0 % (ref 0.0–0.2)

## 2023-09-07 LAB — CMP (CANCER CENTER ONLY)
ALT: 32 U/L (ref 0–44)
AST: 17 U/L (ref 15–41)
Albumin: 4.3 g/dL (ref 3.5–5.0)
Alkaline Phosphatase: 60 U/L (ref 38–126)
Anion gap: 8 (ref 5–15)
BUN: 25 mg/dL — ABNORMAL HIGH (ref 8–23)
CO2: 25 mmol/L (ref 22–32)
Calcium: 9.6 mg/dL (ref 8.9–10.3)
Chloride: 105 mmol/L (ref 98–111)
Creatinine: 1.16 mg/dL — ABNORMAL HIGH (ref 0.44–1.00)
GFR, Estimated: 52 mL/min — ABNORMAL LOW (ref >60)
Glucose, Bld: 118 mg/dL — ABNORMAL HIGH (ref 70–99)
Potassium: 4 mmol/L (ref 3.5–5.1)
Sodium: 138 mmol/L (ref 135–145)
Total Bilirubin: 0.5 mg/dL (ref 0.0–1.2)
Total Protein: 6.8 g/dL (ref 6.5–8.1)

## 2023-09-07 NOTE — Progress Notes (Signed)
 [facility] at [facility] [address]  [place], [place] [address] [phone]   Interval Evaluation  Date of Service: 09/07/23 [name] Patient MRN: [id number] Patient DOB: 01-23-1956 Provider: Arthea MARLA Manns, MD  Identifying Statement:  Jennifer [name] is a 68 y.o. female with left frontal glioblastoma   Oncologic History: Oncology History  Glioblastoma multiforme of frontal lobe (HCC)  04/11/2022 Surgery   Craniotomy, resection of left frontal mass with Dr. Gillie; path is glioblastoma IDHwt.   05/12/2022 - 06/23/2022 Radiation Therapy   IMRT and concurrent Temodar  75mg /m2   07/21/2022 - 03/31/2023 Chemotherapy   Completes 7 cycles of 5-day Temodar    04/23/2023 Progression   Progression of disease   05/06/2023 Surgery   Second craniotomy, resection with Dr. Gillie     Biomarkers:  MGMT Unknown.  IDH 1/2 Wild type.  EGFR Unknown  TERT Unknown   Interval History: Jennifer [name] presents today for follow up after completing cycle #10 5-day TMZ and recent MRI brain.  She did have one seizure this month, and daughter in-law notes issues now with dragging her right leg.  She is using a walker at times at home.  No significant changes with speech, language impairments.  Zyprexa  was more effective for nausea prevention this cycle.  Vimpat  continues at 100mg  BID, continues on Topamax  50mg  BID as well.  Decadron  currently at 4mg  daily.    H+P (05/05/22) Patient presented to neurologic attention in mid-August 2023 with new onset difficulty speaking, confusion noticed by family.  She and her family described sudden onset speech impairment and confusion in the afternoon of the 16th, after having been seen in normal state of health that morning.  CNS imaging demonstrated an enhancing left frontal mass, which was resected by Dr. Gillie on 04/11/22.  Following surgery she had some speech difficulty, but improved quickly through rehab  admission.  At present, she is fully independent with gait, speech, ADLs.  She is off decadron  and Keppra .  Retired, had worked at [employer] in [employer].  Today presents with son and daughter in law.    Medications: Current Outpatient Medications on File Prior to Visit  Medication Sig Dispense Refill   amLODipine  (NORVASC ) 5 MG tablet TAKE 1 TABLET BY MOUTH EVERY DAY (Patient taking differently: Take 5 mg by mouth daily.) 90 tablet 1   atorvastatin  (LIPITOR) 10 MG tablet TAKE 1 TABLET BY MOUTH EVERY DAY (Patient taking differently: Take 10 mg by mouth daily.) 90 tablet 1   busPIRone  (BUSPAR ) 5 MG tablet TAKE 1 TABLET BY MOUTH THREE TIMES A DAY (Patient taking differently: Take 5 mg by mouth 3 (three) times daily.) 270 tablet 1   fluticasone  (FLONASE ) 50 MCG/ACT nasal spray SPRAY 2 SPRAYS INTO EACH NOSTRIL EVERY DAY 48 mL 0   lacosamide  100 MG TABS Take 1 tablet (100 mg total) by mouth 2 (two) times daily. 60 tablet 2   Lacosamide  150 MG TABS Take 1 tablet (150 mg total) by mouth in the morning and at bedtime. 60 tablet 2   LORazepam  (ATIVAN ) 2 MG tablet Take 1 tablet (2 mg total) by mouth every 8 (eight) hours as needed for seizure. 10 tablet 0   losartan  (COZAAR ) 50 MG tablet TAKE 1 TABLET BY MOUTH EVERY DAY (Patient taking differently: Take 50 mg by mouth daily.) 90 tablet 1   OLANZapine  (ZYPREXA ) 5 MG tablet Take 1 tablet (5 mg total) by mouth at bedtime. Take during 5 nights  of chemotherapy only 5 tablet 0   ondansetron  (ZOFRAN ) 8 MG tablet Take 1 tablet (8 mg total) by mouth every 8 (eight) hours as needed for nausea or vomiting. May take 30-60 minutes prior to Temodar  administration if nausea/vomiting occurs as needed. 30 tablet 1   prochlorperazine  (COMPAZINE ) 10 MG tablet Take 1 tablet (10 mg total) by mouth every 6 (six) hours as needed for nausea or vomiting. 30 tablet 1   SUMAtriptan  (IMITREX ) 50 MG tablet Take 50 mg by mouth every 2 (two) hours as needed for migraine. May repeat in 2 hours  if headache persists or recurs.     temozolomide  (TEMODAR ) 250 MG capsule Take 1 capsule (250 mg total) by mouth daily. Take for 5 days on, 23 days off. Repeat every 28 days. May take on an empty stomach to decrease nausea & vomiting. 5 capsule 0   topiramate  (TOPAMAX ) 50 MG tablet TAKE 1 TABLET BY MOUTH TWICE A DAY (Patient taking differently: Take 50 mg by mouth 2 (two) times daily.) 60 tablet 0   traZODone  (DESYREL ) 50 MG tablet Take 1 tablet (50 mg total) by mouth at bedtime as needed for sleep. 30 tablet 2   No current facility-administered medications on file prior to visit.    Allergies: No Known Allergies Past Medical History:  Past Medical History:  Diagnosis Date   Brain cancer (HCC)    Fibromyalgia    Hyperlipidemia    Hypertension    Migraine    Seizures (HCC)    Past Surgical History:  Past Surgical History:  Procedure Laterality Date   APPLICATION OF CRANIAL NAVIGATION N/A 04/11/2022   Procedure: APPLICATION OF CRANIAL NAVIGATION;  Surgeon: Gillie Duncans, MD;  Location: [facility] OR;  Service: Neurosurgery;  Laterality: N/A;   APPLICATION OF CRANIAL NAVIGATION Left 05/06/2023   Procedure: APPLICATION OF CRANIAL NAVIGATION;  Surgeon: Gillie Duncans, MD;  Location: [facility] OR;  Service: Neurosurgery;  Laterality: Left;   COLONOSCOPY     CRANIOTOMY Left 04/11/2022   Procedure: LEFT FRONTAL CRANIOTOMY FOR RESECTION OF TUMOR WITH SONDRA;  Surgeon: Gillie Duncans, MD;  Location: [facility] OR;  Service: Neurosurgery;  Laterality: Left;  RM 21   CRANIOTOMY Left 05/06/2023   Procedure: REDO LEFT CRANIOTOMY FOR TUMOR WITH STEALTH;  Surgeon: Gillie Duncans, MD;  Location[facility] OR;  Service: Neurosurgery;  Laterality: Left;   LAPAROSCOPIC APPENDECTOMY  07/15/2012   Procedure: APPENDECTOMY LAPAROSCOPIC;  Surgeon: Dann FORBES Hummer, MD;  Location: [facility] OR;  Service: General;  Laterality: N/A;  Laparoscopic Appendectomy   Social History:  Social History   Socioeconomic History   Marital status: Widowed     Spouse name: Not on file   Number of children: 2   Years of education: Not on file   Highest education level: Not on file  Occupational History   Occupation: retail    Comment: [employer] in [place], [place]  Tobacco Use   Smoking status: Never   Smokeless tobacco: Never  Vaping Use   Vaping status: Never Used  Substance and Sexual Activity   Alcohol  use: Not Currently    Comment: 2 drinks once every 2-3 months   Drug use: No   Sexual activity: Not Currently    Birth control/protection: Post-menopausal  Other Topics Concern   Not on file  Social History Narrative   Marital status: widowed since 2008; husband was bipolar and alcoholic      Children: 2 children (28, 57); son lives with patient.Daughter lives in [place]/autism/does not drive.  Daughter  lives with mother-in-law.  One grandpuppy      Lives: with son      Employment: works at [employer]; also works in [employer] business.      Tobacco: none      Alcohol : none; husband was an alcoholic.      Drugs:   none      Exercise:   Social Drivers of Corporate Investment [employer]er Strain: Low Risk  (02/26/2022)   Received from [facility]   Overall Financial Resource Strain (CARDIA)  Food Insecurity: No Food Insecurity (02/26/2022)   Received from [facility]   Hunger Vital Sign  Transportation Needs: No Transportation Needs (02/26/2022)   Received from [facility] - Transportation  Physical Activity: Insufficiently Active (02/26/2022)   Received from [facility]   Exercise Vital Sign  Stress: Stress Concern Present (02/26/2022)   Received from [facility]   [employer] of Occupational Health - Occupational Stress Questionnaire  Social Connections: Socially Isolated (02/26/2022)   Received from [facility]   Social Connection and Isolation Panel [NHANES]  Intimate Partner Violence: Not At Risk (02/26/2022)   Received from [facility] visits prior to 10/25/2022., [facility]  [facility] visits prior to 10/25/2022.   Humiliation, Afraid, Rape, and Kick questionnaire    Fear of Current or Ex-Partner: No    Emotionally Abused: No    Physically Abused: No    Sexually Abused: No   Family History:  Family History  Adopted: Yes    Review of Systems: Constitutional: Doesn't report fevers, chills or abnormal weight loss Eyes: Doesn't report blurriness of vision Ears, nose, mouth, throat, and face: Doesn't report sore throat Respiratory: Doesn't report cough, dyspnea or wheezes Cardiovascular: Doesn't report palpitation, chest discomfort  Gastrointestinal:  Doesn't report nausea, constipation, diarrhea GU: Doesn't report incontinence Skin: Doesn't report skin rashes Neurological: Per HPI Musculoskeletal: Doesn't report joint pain Behavioral/Psych: Doesn't report anxiety  Physical Exam: Vitals:   09/07/23 1022  BP: (!) 141/87  Pulse: 65  Resp: 18  Temp: 97.7 F (36.5 C)  SpO2: 99%   KPS: 70 General: Alert, cooperative, pleasant, in no acute distress Head: Surgical sutures EENT: No conjunctival injection or scleral icterus.  Lungs: Resp effort normal Cardiac: Regular rate Abdomen: Non-distended abdomen Skin: No rashes cyanosis or petechiae. Extremities: No clubbing or edema  Neurologic Exam: Mental Status: Awake, alert, attentive to examiner. Oriented to self and environment. Language is modestly impaired with regards to fluency.  Pseudobulbar elements. Cranial Nerves: Visual acuity is grossly normal. Visual fields are full. Extra-ocular movements intact. No ptosis. Face is symmetric Motor: Tone and bulk are normal. Power is 4+/5 in right arm and leg. Reflexes are symmetric, no pathologic reflexes present.  Sensory: Intact to light touch Gait: Hemiparetic, non ambulatory   Labs: I have reviewed the data as listed    Component Value Date/Time   NA 139 08/03/2023 1342   NA 140 10/04/2018 1703   K 4.0 08/03/2023 1342   CL 106 08/03/2023  1342   CO2 26 08/03/2023 1342   GLUCOSE 126 (H) 08/03/2023 1342   BUN 24 (H) 08/03/2023 1342   BUN 24 10/04/2018 1703   CREATININE 1.18 (H) 08/03/2023 1342   CREATININE 1.06 (H) 01/29/2016 1518   CALCIUM  9.4 08/03/2023 1342   PROT 6.6 08/03/2023 1342   PROT 6.8 10/04/2018 1703   ALBUMIN  4.1 08/03/2023 1342   ALBUMIN  4.6 10/04/2018 1703   AST 19 08/03/2023 1342  ALT 39 08/03/2023 1342   ALKPHOS 55 08/03/2023 1342   BILITOT 0.7 08/03/2023 1342   GFRNONAA 51 (L) 08/03/2023 1342   GFRNONAA 58 (L) 01/29/2016 1518   GFRAA 70 10/04/2018 1703   GFRAA 67 01/29/2016 1518   Lab Results  Component Value Date   WBC 15.4 (H) 08/03/2023   NEUTROABS 13.6 (H) 08/03/2023   HGB 13.9 08/03/2023   HCT 40.9 08/03/2023   MCV 104.9 (H) 08/03/2023   PLT 169 08/03/2023    Imaging:  [facility] Clinician Interpretation: I have personally reviewed the CNS images as listed.  My interpretation, in the context of the patient's clinical presentation, is progressive disease  MR BRAIN W WO CONTRAST Result Date: 09/03/2023 CLINICAL DATA:  Glioblastoma the frontal lobe, assess treatment response EXAM: MRI HEAD WITHOUT AND WITH CONTRAST TECHNIQUE: Multiplanar, multiecho pulse sequences of the brain and surrounding structures were obtained without and with intravenous contrast. CONTRAST:  7mL GADAVIST  GADOBUTROL  1 MMOL/ML IV SOLN COMPARISON:  06/25/2023 MRI FINDINGS: Brain: Status post left frontal craniotomy with subjacent resection cavity. There is an interval decrease in the size of the resection cavity, but increased nodular enhancement along the medial margin (series 16, image 103-118) and along the anterolateral margin (series 16, image 126). New nodular enhancement along the posterior aspect of the resection cavity (series 16, image 109). Slightly increased restricted diffusion along the posterior aspect of the resection cavity (series 5, image 43). A new area of enhancement is noted in the right frontal lobe (series  16, image 102). Associated T2 hyperintense signal appears similar in the left frontal lobe, but somewhat increased in the right frontal lobe (series 9, image 34), which is more apparent when comparing to 05/07/2023. No significant midline shift. Dural thickening and a small fluid collection subjacent to the craniotomy flap. No evidence of acute infarct, hemorrhage, hydrocephalus, or new extra-axial collection. Vascular: Normal arterial flow voids. Normal arterial and venous enhancement. Skull and upper cervical spine: Left frontal craniotomy. Otherwise normal marrow signal. Sinuses/Orbits: Air-fluid level in the right maxillary sinus and right sphenoid sinus. Mucosal thickening in the left sphenoid sinus and bilateral frontal and anterior ethmoid air cells. No acute finding in the orbits. Other: Trace fluid in left mastoid air cells. IMPRESSION: 1. Interval decrease in the size of the left frontal resection cavity, with increased nodular enhancement along the medial and anterolateral margins, as well as new nodular enhancement along the posterior aspect of the resection cavity. Slightly increased restricted diffusion along the posterior aspect of the resection cavity. These findings are concerning for disease progression. 2. New area of enhancement in the right frontal lobe, in an area of increased T2 hyperintense signal, which is also concerning for disease progression. 3. Air-fluid levels in the right maxillary and sphenoid sinuses, which can be seen in the setting of acute sinusitis. Electronically Signed   By: Donald Campion M.D.   On: 09/03/2023 18:05   Assessment/Plan Glioblastoma multiforme of frontal lobe (HCC)  Focal seizures (HCC)  Jennifer [name] is clinically progressive, with modest increase in motor deficits on the right.  MRI brain unfortunately demonstrates progression of disease, with increased thickness of enhancing components adjacent to re-resection cavity.    We recommended discontinuing  continuing treatment with Temozolomide .  We discussed options for second line therapy, including CCNU/avastin  and clinical trial referral.     Patient elected to proceed with oral CCNU 90mg /m2 q6 weeks and avastin  10mg /kg IV 2q weeks.  Avastin  will be helpful as concurrent therapy given  burden of enhancing tumor and steroid requirement.  We reviewed side effects of CCNU, including fatigue, nausea vomiting, cytopenias, ILD.  We reviewed side effects of avastin , including hypertension, bleeding/clotting events, wound healing impairment.  The patient will have a complete blood count, a comprehensive metabolic panel, and urine protein performed prior to each avastin  infusion. Labs may need to be performed more often. Zofran  will prescribed for home use for nausea/vomiting.    Informed consent was obtained verbally at bedside to proceed with oral chemotherapy and avastin .  Chemotherapy should be held for the following:  ANC less than 1,000  Platelets less than 100,000  LFT or creatinine greater than 2x ULN  If clinical concerns/contraindications develop   Avastin  should be held for the following:  ANC less than 500  Platelets less than 50,000  LFT or creatinine greater than 2x ULN  If clinical concerns/contraindications develop  Decadron  can remain at 4mg  BID until avastin  is initiated.  We also recommended she continue Vimpat  10mg  BID and Topiramate  50mg  BID for now.  We ask that Nathanel JINNY Gavel return to clinic for cycle #1 avastin /CCNU or sooner, if needed.  All questions were answered. The patient knows to call the clinic with any problems, questions or concerns. No barriers to learning were detected.  The total time spent in the encounter was 40 minutes and more than 50% was on counseling and review of test results   Arthea MARLA Manns, MD Medical Director of Neuro-Oncology [facility] 09/07/23 10:10 AM

## 2023-09-08 ENCOUNTER — Other Ambulatory Visit: Payer: Self-pay

## 2023-09-09 ENCOUNTER — Encounter: Payer: Self-pay | Admitting: Internal Medicine

## 2023-09-10 ENCOUNTER — Other Ambulatory Visit (HOSPITAL_COMMUNITY): Payer: Self-pay

## 2023-09-10 ENCOUNTER — Telehealth: Payer: Self-pay | Admitting: *Deleted

## 2023-09-10 ENCOUNTER — Telehealth: Payer: Self-pay | Admitting: Internal Medicine

## 2023-09-10 ENCOUNTER — Telehealth: Payer: Self-pay | Admitting: Pharmacist

## 2023-09-10 ENCOUNTER — Other Ambulatory Visit: Payer: Self-pay

## 2023-09-10 ENCOUNTER — Other Ambulatory Visit: Payer: Self-pay | Admitting: Internal Medicine

## 2023-09-10 ENCOUNTER — Telehealth: Payer: Self-pay | Admitting: Pharmacy Technician

## 2023-09-10 ENCOUNTER — Encounter: Payer: Self-pay | Admitting: Internal Medicine

## 2023-09-10 DIAGNOSIS — C711 Malignant neoplasm of frontal lobe: Secondary | ICD-10-CM

## 2023-09-10 MED ORDER — LOMUSTINE 40 MG PO CAPS: 40.0000 mg | ORAL_CAPSULE | Freq: Once | ORAL | 0 refills | Status: DC

## 2023-09-10 MED ORDER — LOMUSTINE 10 MG PO CAPS: 20.0000 mg | ORAL_CAPSULE | Freq: Once | ORAL | 0 refills | Status: DC

## 2023-09-10 MED ORDER — LOMUSTINE 100 MG PO CAPS: 100.0000 mg | ORAL_CAPSULE | Freq: Once | ORAL | 0 refills | Status: DC

## 2023-09-10 MED ORDER — ONDANSETRON HCL 8 MG PO TABS
8.0000 mg | ORAL_TABLET | Freq: Three times a day (TID) | ORAL | 1 refills | Status: DC | PRN
  Filled 2023-09-10: qty 30, 10d supply, fill #0

## 2023-09-10 NOTE — Telephone Encounter (Signed)
Oral Oncology Patient Advocate Encounter   Began application for assistance for Gleostine through [employer].   Application will be submitted upon completion of necessary supporting documentation.   [employer] phone number [phone].   I will continue to check the status until final determination.   Jinger Neighbors, CPhT-Adv Oncology Pharmacy Patient Advocate [facility] Direct Number: [phone]  Fax: [phone]

## 2023-09-10 NOTE — Progress Notes (Signed)
DISCONTINUE ON PATHWAY REGIMEN - Neuro     One cycle, concurrent with RT:     Temozolomide   **Always confirm dose/schedule in your pharmacy ordering system**  PRIOR TREATMENT: [id number]: Radiation Therapy with Concurrent Temozolomide 75 mg/m2 Daily x 6 Weeks, Followed by Adjuvant Temozolomide  START ON PATHWAY REGIMEN - Neuro     A cycle is every 42 days:     Lomustine   **Always confirm dose/schedule in your pharmacy ordering system**  Patient Characteristics: Glioma, Glioblastoma, IDH-wildtype, Recurrent or Progressive, Nonsurgical Candidate, Systemic Therapy Candidate, BRAF V600E Mutation Negative/Unknown and NTRK Fusion Negative/Unknown Disease Classification: Glioma Disease Classification: Glioblastoma, IDH-wildtype Disease Status: Recurrent or Progressive Treatment Classification: Nonsurgical Candidate Treatment (Nonsurgical/Adjuvant): Systemic Therapy Candidate NTRK Gene Fusion Status: Negative BRAF V600E Mutation Status: Negative Intent of Therapy: Non-Curative / Palliative Intent, Discussed with Patient

## 2023-09-10 NOTE — Telephone Encounter (Signed)
Per Dr Liana Gerold request, PC to patient's daughter-in-law Jennifer [name], no answer, left VM - informed Jennifer [name] we got the MyChart message stating Jennifer [name] wants to move forward with new tx plan.  Dr Barbaraann Cao is ordering oral chemo tx as well as avastin infusions.  Someone will be calling from our pharmacy about the oral chemo & scheduling will be contacting them to set up infusions.  Instructed Jennifer [name] to contact this office with any questions/concerns, [phone].

## 2023-09-10 NOTE — Telephone Encounter (Signed)
 Marland Kitchen

## 2023-09-10 NOTE — Telephone Encounter (Signed)
Oral Oncology Patient Advocate Encounter  After completing a benefits investigation, prior authorization for lomustine is not required at this time through [facility].  Patient's copay is $1,319.67.  (total for all strengths needed to make correct dose for patient).   Jinger Neighbors, CPhT-Adv Oncology Pharmacy Patient Advocate [facility] Direct Number: [phone]  Fax: [phone]

## 2023-09-10 NOTE — Telephone Encounter (Signed)
Oral Oncology Pharmacist Encounter  Received new prescription for Gleostine (lomustine) for the treatment of glioblastoma in conjunction with bevacizumab, planned duration until disease progression or unacceptable drug toxicity.  CBC w/ Diff and CMP from 09/07/23 assessed, noted patient with Scr of 1.16 mg/dL (CrCl ~ 16.1 mL/min). Given CrCL > 50 mL/min, OK to continue dose as is. Recommend monitoring renal function closely while on therapy for need for possible dose adjustments in the future. Prescription dose and frequency assessed for appropriateness.  Current medication list in Epic reviewed, no relevant/significant DDIs with Gleostine identified.  Evaluated chart and no patient barriers to medication adherence noted.   Patient agreement for treatment documented in MD note on 09/07/23.  Due to high copay for Gleostine - oral chemotherapy clinic will proceed with applying for patient assistance at this time for patient.   Oral Oncology Clinic will continue to follow for initial counseling and start date.  Sherry Ruffing, PharmD, BCPS, BCOP Hematology/Oncology Clinical Pharmacist [facility] Oral Chemotherapy Navigation Clinics [phone] 09/10/2023 10:50 AM

## 2023-09-11 ENCOUNTER — Telehealth: Payer: Self-pay | Admitting: Internal Medicine

## 2023-09-11 ENCOUNTER — Other Ambulatory Visit: Payer: Self-pay

## 2023-09-11 NOTE — Telephone Encounter (Signed)
Oral Oncology Patient Advocate Encounter  Prescriptions released to [facility] per program request. Copies of rxs sent to program via e-fax [phone]  Jinger Neighbors, CPhT-Adv Oncology Pharmacy Patient Advocate [facility] Direct Number: [phone]  Fax: [phone]

## 2023-09-11 NOTE — Telephone Encounter (Signed)
 Jennifer [name]

## 2023-09-11 NOTE — Telephone Encounter (Signed)
Oral Oncology Patient Advocate Encounter  Called and spoke with the patient's son, Jomarie Longs. He agreed to bring POI to the office on Monday and provide signatures at that time.  Application will be submitted once this is complete.  Jinger Neighbors, CPhT-Adv Oncology Pharmacy Patient Advocate [facility] Direct Number: [phone]  Fax: [phone]

## 2023-09-11 NOTE — Telephone Encounter (Signed)
Oral Oncology Patient Advocate Encounter   Submitted application for assistance for Gleostine to [employer].   Application submitted via e-fax to [phone]   [employer] phone number [phone].   I will continue to check the status until final determination.   Lady Deutscher, CPhT-Adv Oncology Pharmacy Patient [place] Direct Number: [phone]  Fax: [phone]

## 2023-09-11 NOTE — Telephone Encounter (Signed)
Oral Oncology Patient Advocate Encounter   Received notification that the application for assistance for Gleostine through [employer] has been approved.   [employer] phone number [phone] [facility] phone number [phone].   Effective dates: 09/11/23 through 6 cycles  Medication will be filled at [facility].  I have spoken to the patient.  Jinger Neighbors, CPhT-Adv Oncology Pharmacy Patient Advocate [facility] Direct Number: [phone]  Fax: [phone]

## 2023-09-13 ENCOUNTER — Other Ambulatory Visit: Payer: Self-pay

## 2023-09-14 NOTE — Telephone Encounter (Signed)
Oral Oncology Patient Advocate Encounter  [facility] called to set up first delivery of Gleostine. [facility]. Once shipment is received patient's family will be notified that it is ready for pickup.  Jinger Neighbors, CPhT-Adv Oncology Pharmacy Patient Advocate [facility] Direct Number: [phone]  Fax: [phone]

## 2023-09-16 ENCOUNTER — Inpatient Hospital Stay: Payer: Medicare Other

## 2023-09-16 ENCOUNTER — Other Ambulatory Visit: Payer: Self-pay

## 2023-09-16 ENCOUNTER — Other Ambulatory Visit: Payer: Self-pay | Admitting: Hematology and Oncology

## 2023-09-16 ENCOUNTER — Encounter: Payer: Self-pay | Admitting: Internal Medicine

## 2023-09-16 ENCOUNTER — Other Ambulatory Visit: Payer: Self-pay | Admitting: Pharmacist

## 2023-09-16 DIAGNOSIS — C711 Malignant neoplasm of frontal lobe: Secondary | ICD-10-CM

## 2023-09-16 MED ORDER — OLANZAPINE 5 MG PO TABS: 5.0000 mg | ORAL_TABLET | Freq: Every day | ORAL | 0 refills | Status: DC

## 2023-09-16 NOTE — Telephone Encounter (Signed)
Oral Oncology Patient Advocate Encounter  Tracking for the order shows package will be delayed until tomorrow 09/17/23. UPS tracking # [id number]   Jinger Neighbors, CPhT-Adv Oncology Pharmacy Patient Advocate [facility] Direct Number: [phone]  Fax: [phone]

## 2023-09-16 NOTE — Telephone Encounter (Signed)
Oral Chemotherapy Pharmacist Encounter  I spoke with patient's daugther in law, Marcelino Duster, for overview of: lomustine for the treatment of glioblastoma in conjunction with bevacizumab, planned duration until disease progression or unacceptable toxicity.  Counseled Michelle on administration, dosing, side effects, monitoring, drug-food interactions, safe handling, storage, and disposal.  Patient will take lomustine, two 10mg  capsules, one 40 mg capsule, and one 100 mg capsule (160 mg total dose) by mouth once daily for 1 dose, may take at bedtime and on an empty stomach to decrease nausea and vomiting. Emphasized to [place] that all 4 capsules will be taken at the same time for only one dose.   Lomustine will be administered once every 6 weeks.  Lomustine start date: 09/17/23 PM (pending MD visit on 09/17/23)   Patient will take Zofran 8mg  tablet, 1 tablet by mouth 30-60 min prior to lomustine dose to help decrease N/V. Marcelino Duster stated the patient had issues with nausea in the past with Temodar. We discussed that patient can also dose her prescribed olanzapine the night of her lomustine dose and for the few nights after to help with chemo-induced N/V. Patient is currently out of refills on olanzapine - will send staff message to Dr. Barbaraann Cao.    Adverse effects include but are not limited to: nausea, vomiting, stomatitis, decreased blood counts, hepatotoxicity.  Reviewed with patient importance of keeping a medication schedule and plan for any missed doses. No barriers to medication adherence identified.  Medication reconciliation performed and medication/allergy list updated.  Medication is being filled through [employer] [facility]. The medication is scheduled to be delivered to [facility] on 09/17/23. We will coordinate with Marcelino Duster regarding getting medication in their hands once we receive it.   All questions answered.  Marcelino Duster voiced understanding and appreciation.    Medication education handout placed in mail for patient and patient's family. Marcelino Duster knows to call the office with questions or concerns. Oral Chemotherapy Clinic phone number provided to patient.   Sherry Ruffing, PharmD, BCPS, BCOP Hematology/Oncology Clinical Pharmacist [facility] Oral Chemotherapy Navigation Clinics [phone] 09/16/2023 2:56 PM

## 2023-09-17 ENCOUNTER — Encounter: Payer: Self-pay | Admitting: Internal Medicine

## 2023-09-17 ENCOUNTER — Inpatient Hospital Stay (HOSPITAL_BASED_OUTPATIENT_CLINIC_OR_DEPARTMENT_OTHER): Payer: Medicare Other | Admitting: Internal Medicine

## 2023-09-17 ENCOUNTER — Inpatient Hospital Stay: Payer: Medicare Other

## 2023-09-17 VITALS — BP 108/73 | HR 102 | Temp 97.9°F | Resp 16 | Ht 63.0 in | Wt 165.8 lb

## 2023-09-17 VITALS — BP 111/70 | HR 87 | Temp 98.8°F | Resp 16

## 2023-09-17 DIAGNOSIS — Z5112 Encounter for antineoplastic immunotherapy: Secondary | ICD-10-CM | POA: Diagnosis not present

## 2023-09-17 DIAGNOSIS — C711 Malignant neoplasm of frontal lobe: Secondary | ICD-10-CM

## 2023-09-17 DIAGNOSIS — R569 Unspecified convulsions: Secondary | ICD-10-CM

## 2023-09-17 DIAGNOSIS — R270 Ataxia, unspecified: Secondary | ICD-10-CM

## 2023-09-17 LAB — CMP (CANCER CENTER ONLY)
ALT: 37 U/L (ref 0–44)
AST: 12 U/L — ABNORMAL LOW (ref 15–41)
Albumin: 3.9 g/dL (ref 3.5–5.0)
Alkaline Phosphatase: 55 U/L (ref 38–126)
Anion gap: 8 (ref 5–15)
BUN: 27 mg/dL — ABNORMAL HIGH (ref 8–23)
CO2: 23 mmol/L (ref 22–32)
Calcium: 9.1 mg/dL (ref 8.9–10.3)
Chloride: 104 mmol/L (ref 98–111)
Creatinine: 0.98 mg/dL (ref 0.44–1.00)
GFR, Estimated: >60 mL/min (ref >60)
Glucose, Bld: 90 mg/dL (ref 70–99)
Potassium: 3.9 mmol/L (ref 3.5–5.1)
Sodium: 135 mmol/L (ref 135–145)
Total Bilirubin: 0.7 mg/dL (ref 0.0–1.2)
Total Protein: 6.1 g/dL — ABNORMAL LOW (ref 6.5–8.1)

## 2023-09-17 LAB — CBC WITH DIFFERENTIAL (CANCER CENTER ONLY)
Abs Immature Granulocytes: 0.74 10*3/uL — ABNORMAL HIGH (ref 0.00–0.07)
Basophils Absolute: 0.1 10*3/uL (ref 0.0–0.1)
Basophils Relative: 0 %
Eosinophils Absolute: 0.1 10*3/uL (ref 0.0–0.5)
Eosinophils Relative: 0 %
HCT: 45.6 % (ref 36.0–46.0)
Hemoglobin: 15.9 g/dL — ABNORMAL HIGH (ref 12.0–15.0)
Immature Granulocytes: 4 %
Lymphocytes Relative: 6 %
Lymphs Abs: 1.1 10*3/uL (ref 0.7–4.0)
MCH: 35.4 pg — ABNORMAL HIGH (ref 26.0–34.0)
MCHC: 34.9 g/dL (ref 30.0–36.0)
MCV: 101.6 fL — ABNORMAL HIGH (ref 80.0–100.0)
Monocytes Absolute: 1.3 10*3/uL — ABNORMAL HIGH (ref 0.1–1.0)
Monocytes Relative: 6 %
Neutro Abs: 17.3 10*3/uL — ABNORMAL HIGH (ref 1.7–7.7)
Neutrophils Relative %: 84 %
Platelet Count: 213 10*3/uL (ref 150–400)
RBC: 4.49 MIL/uL (ref 3.87–5.11)
RDW: 13.8 % (ref 11.5–15.5)
WBC Count: 20.6 10*3/uL — ABNORMAL HIGH (ref 4.0–10.5)
nRBC: 0 % (ref 0.0–0.2)

## 2023-09-17 MED ORDER — SODIUM CHLORIDE 0.9 % IV SOLN: INTRAVENOUS | Status: DC

## 2023-09-17 MED ORDER — SODIUM CHLORIDE 0.9 % IV SOLN
10.0000 mg/kg | Freq: Once | INTRAVENOUS | Status: AC
  Administered 2023-09-17: 800 mg via INTRAVENOUS
  Filled 2023-09-17: qty 32

## 2023-09-17 NOTE — Progress Notes (Signed)
Per Dr. Barbaraann Cao, okay to proceed without urine protein today.

## 2023-09-17 NOTE — Patient Instructions (Signed)
[facility] - A DEPT OF [facility]  Discharge Instructions: Thank you for choosing [facility] to provide your oncology and hematology care.   If you have a lab appointment with the Cancer Center, please go directly to the Cancer Center and check in at the registration area.   Wear comfortable clothing and clothing appropriate for easy access to any Portacath or PICC line.   We strive to give you quality time with your provider. You may need to reschedule your appointment if you arrive late (15 or more minutes).  Arriving late affects you and other patients whose appointments are after yours.  Also, if you miss three or more appointments without notifying the office, you may be dismissed from the clinic at the provider's discretion.      For prescription refill requests, have your pharmacy contact our office and allow 72 hours for refills to be completed.    Today you received the following chemotherapy and/or immunotherapy agents Mvasi      To help prevent nausea and vomiting after your treatment, we encourage you to take your nausea medication as directed.  BELOW ARE SYMPTOMS THAT SHOULD BE REPORTED IMMEDIATELY: *FEVER GREATER THAN 100.4 F (38 C) OR HIGHER *CHILLS OR SWEATING *NAUSEA AND VOMITING THAT IS NOT CONTROLLED WITH YOUR NAUSEA MEDICATION *UNUSUAL SHORTNESS OF BREATH *UNUSUAL BRUISING OR BLEEDING *URINARY PROBLEMS (pain or burning when urinating, or frequent urination) *BOWEL PROBLEMS (unusual diarrhea, constipation, pain near the anus) TENDERNESS IN MOUTH AND THROAT WITH OR WITHOUT PRESENCE OF ULCERS (sore throat, sores in mouth, or a toothache) UNUSUAL RASH, SWELLING OR PAIN  UNUSUAL VAGINAL DISCHARGE OR ITCHING   Items with * indicate a potential emergency and should be followed up as soon as possible or go to the Emergency Department if any problems should occur.  Please show the CHEMOTHERAPY ALERT CARD or IMMUNOTHERAPY ALERT  CARD at check-in to the Emergency Department and triage nurse.  Should you have questions after your visit or need to cancel or reschedule your appointment, please contact [facility] - A DEPT OF [facility]  Dept: [phone]  and follow the prompts.  Office hours are 8:00 a.m. to 4:30 p.m. Monday - Friday. Please note that voicemails left after 4:00 p.m. may not be returned until the following business day.  We are closed weekends and major holidays. You have access to a nurse at all times for urgent questions. Please call the main number to the clinic Dept: [phone] and follow the prompts.   For any non-urgent questions, you may also contact your provider using MyChart. We now offer e-Visits for anyone 38 and older to request care online for non-urgent symptoms. For details visit [email or web address].   Also download the MyChart app! Go to the app store, search "MyChart", open the app, select [place], and log in with your MyChart username and password.

## 2023-09-17 NOTE — Progress Notes (Signed)
[facility] at [facility] [name]  [place], [place] [address] [phone]   Interval Evaluation  Date of Service: 09/17/23 Patient Name: CARMELETA PIELA Patient MRN: [id number] Patient DOB: 07-04-1956 Provider: Henreitta Leber, MD  Identifying Statement:  Jennifer FREDERICKSEN is a 68 y.o. female with left frontal glioblastoma   Oncologic History: Oncology History  Glioblastoma multiforme of frontal lobe (HCC)  04/11/2022 Surgery   Craniotomy, resection of left frontal mass with Dr. Franky Macho; path is glioblastoma IDHwt.   05/12/2022 - 06/23/2022 Radiation Therapy   IMRT and concurrent Temodar 75mg /m2   07/21/2022 - 03/31/2023 Chemotherapy   Completes 7 cycles of 5-day Temodar   04/23/2023 Progression   Progression of disease   05/06/2023 Surgery   Second craniotomy, resection with Dr. Franky Macho   09/17/2023 -  Chemotherapy   Patient is on Treatment Plan : BRAIN Bevacizumab D1,15,29 + Lomustine (90) D1 q42d       Biomarkers:  MGMT Unknown.  IDH 1/2 Wild type.  EGFR Unknown  TERT Unknown   Interval History: DEMEISHA [name] presents today for planned initiation of avastin and CCNU therapy.  She did have one seizure this month, and daughter in-law notes issues now with dragging her right leg.  She is using a walker at times at home.  No significant changes with speech, language impairments.  Zyprexa was more effective for nausea prevention this cycle.  Vimpat continues at 100mg  BID, continues on Topamax 50mg  BID as well.  Decadron currently at 4mg  daily.    H+P (05/05/22) Patient presented to neurologic attention in mid-August 2023 with new onset difficulty speaking, confusion noticed by family.  She and her family described sudden onset speech impairment and confusion in the afternoon of the 16th, after having been seen in normal state of health that morning.  CNS imaging demonstrated an enhancing left frontal mass, which was resected by Dr. Franky Macho on  04/11/22.  Following surgery she had some speech difficulty, but improved quickly through rehab admission.  At present, she is fully independent with gait, speech, ADLs.  She is off decadron and Keppra.  Retired, had worked at [employer] in [employer].  Today presents with son and daughter in law.    Medications: Current Outpatient Medications on File Prior to Visit  Medication Sig Dispense Refill   amLODipine (NORVASC) 5 MG tablet TAKE 1 TABLET BY MOUTH EVERY DAY (Patient taking differently: Take 5 mg by mouth daily.) 90 tablet 1   atorvastatin (LIPITOR) 10 MG tablet TAKE 1 TABLET BY MOUTH EVERY DAY (Patient taking differently: Take 10 mg by mouth daily.) 90 tablet 1   busPIRone (BUSPAR) 5 MG tablet TAKE 1 TABLET BY MOUTH THREE TIMES A DAY (Patient taking differently: Take 5 mg by mouth 3 (three) times daily.) 270 tablet 1   dexamethasone (DECADRON) 4 MG tablet Take 4 mg by mouth 2 (two) times daily.     fluticasone (FLONASE) 50 MCG/ACT nasal spray SPRAY 2 SPRAYS INTO EACH NOSTRIL EVERY DAY 48 mL 0   Lacosamide 150 MG TABS Take 1 tablet (150 mg total) by mouth in the morning and at bedtime. 60 tablet 2   lomustine (GLEOSTINE) 10 MG capsule Take 2 capsules (20 mg total) by mouth once for 1 dose. Take on an empty stomach 1 hour before or 2 hours after meals. Caution:chemotherapy. 2 capsule 0   lomustine (GLEOSTINE) 100 MG capsule Take 1 capsule (100 mg total) by mouth once for 1 dose. Take on  an empty stomach 1 hour before or 2 hours after meals. Caution:chemotherapy 1 capsule 0   lomustine (GLEOSTINE) 40 MG capsule Take 1 capsule (40 mg total) by mouth once for 1 dose. Take on an empty stomach 1 hour before or 2 hours after meals. Caution:chemotherapy. 1 capsule 0   LORazepam (ATIVAN) 2 MG tablet Take 1 tablet (2 mg total) by mouth every 8 (eight) hours as needed for seizure. 10 tablet 0   losartan (COZAAR) 50 MG tablet TAKE 1 TABLET BY MOUTH EVERY DAY (Patient taking differently: Take 50 mg by mouth  daily.) 90 tablet 1   OLANZapine (ZYPREXA) 5 MG tablet Take 1 tablet (5 mg total) by mouth at bedtime. Take during 5 nights of chemotherapy only 5 tablet 0   ondansetron (ZOFRAN) 8 MG tablet Take 1 tablet (8 mg total) by mouth every 8 (eight) hours as needed for nausea or vomiting. 30 tablet 1   SUMAtriptan (IMITREX) 50 MG tablet Take 50 mg by mouth every 2 (two) hours as needed for migraine. May repeat in 2 hours if headache persists or recurs.     topiramate (TOPAMAX) 50 MG tablet TAKE 1 TABLET BY MOUTH TWICE A DAY (Patient taking differently: Take 50 mg by mouth 2 (two) times daily.) 60 tablet 0   traZODone (DESYREL) 50 MG tablet Take 1 tablet (50 mg total) by mouth at bedtime as needed for sleep. 30 tablet 2   No current facility-administered medications on file prior to visit.    Allergies: No Known Allergies Past Medical History:  Past Medical History:  Diagnosis Date   Brain cancer (HCC)    Fibromyalgia    Hyperlipidemia    Hypertension    Migraine    Seizures (HCC)    Past Surgical History:  Past Surgical History:  Procedure Laterality Date   APPLICATION OF CRANIAL NAVIGATION N/A 04/11/2022   Procedure: APPLICATION OF CRANIAL NAVIGATION;  Surgeon: Coletta Memos, MD;  Location: [facility] OR;  Service: Neurosurgery;  Laterality: N/A;   APPLICATION OF CRANIAL NAVIGATION Left 05/06/2023   Procedure: APPLICATION OF CRANIAL NAVIGATION;  Surgeon: Coletta Memos, MD;  Location: [facility] OR;  Service: Neurosurgery;  Laterality: Left;   COLONOSCOPY     CRANIOTOMY Left 04/11/2022   Procedure: LEFT FRONTAL CRANIOTOMY FOR RESECTION OF TUMOR WITH [name];  Surgeon: Coletta Memos, MD;  Location: [facility] OR;  Service: Neurosurgery;  Laterality: Left;  RM 21   CRANIOTOMY Left 05/06/2023   Procedure: REDO LEFT CRANIOTOMY FOR TUMOR WITH STEALTH;  Surgeon: Coletta Memos, MD;  Location: [facility] OR;  Service: Neurosurgery;  Laterality: Left;   LAPAROSCOPIC APPENDECTOMY  07/15/2012   Procedure: APPENDECTOMY LAPAROSCOPIC;   Surgeon: Liz Malady, MD;  Location: [facility] OR;  Service: General;  Laterality: N/A;  Laparoscopic Appendectomy   Social History:  Social History   Socioeconomic History   Marital status: Widowed    Spouse name: Not on file   Number of children: 2   Years of education: Not on file   Highest education level: Not on file  Occupational History   Occupation: retail    Comment: [employer] in [place], [place]  Tobacco Use   Smoking status: Never   Smokeless tobacco: Never  Vaping Use   Vaping status: Never Used  Substance and Sexual Activity   Alcohol use: Not Currently    Comment: 2 drinks once every 2-3 months   Drug use: No   Sexual activity: Not Currently    Birth control/protection: Post-menopausal  Other Topics Concern  Not on file  Social History Narrative   Marital status: widowed since 2008; husband was bipolar and alcoholic      Children: 2 children (28, 63); son lives with patient.Daughter lives in [place]/autism/does not drive.  Daughter lives with mother-in-law.  One grandpuppy      Lives: with son      Employment: works at [employer]; also works in Corporate investment banker business.      Tobacco: none      Alcohol: none; husband was an alcoholic.      Drugs:   none      Exercise:   Social Drivers of Corporate investment banker Strain: Low Risk  (02/26/2022)   Received from [facility]   Overall Financial Resource Strain (CARDIA)  Food Insecurity: No Food Insecurity (02/26/2022)   Received from [facility]   Hunger Vital Sign  Transportation Needs: No Transportation Needs (02/26/2022)   Received from [facility] - Transportation  Physical Activity: Insufficiently Active (02/26/2022)   Received from [facility]   Exercise Vital Sign  Stress: Stress Concern Present (02/26/2022)   Received from [facility]   [employer] of Occupational Health - Occupational Stress Questionnaire  Social Connections: Socially Isolated (02/26/2022)   Received from  [facility]   Social Connection and Isolation Panel [NHANES]  Intimate Partner Violence: Not At Risk (02/26/2022)   Received from [facility] visits prior to 10/25/2022., [facility] visits prior to 10/25/2022.   Humiliation, Afraid, Rape, and Kick questionnaire    Fear of Current or Ex-Partner: No    Emotionally Abused: No    Physically Abused: No    Sexually Abused: No   Family History:  Family History  Adopted: Yes    Review of Systems: Constitutional: Doesn't report fevers, chills or abnormal weight loss Eyes: Doesn't report blurriness of vision Ears, nose, mouth, throat, and face: Doesn't report sore throat Respiratory: Doesn't report cough, dyspnea or wheezes Cardiovascular: Doesn't report palpitation, chest discomfort  Gastrointestinal:  Doesn't report nausea, constipation, diarrhea GU: Doesn't report incontinence Skin: Doesn't report skin rashes Neurological: Per HPI Musculoskeletal: Doesn't report joint pain Behavioral/Psych: Doesn't report anxiety  Physical Exam: Vitals:   09/17/23 1209  BP: 108/73  Pulse: (!) 102  Resp: 16  Temp: 97.9 F (36.6 C)  SpO2: 97%    KPS: 70 General: Alert, cooperative, pleasant, in no acute distress Head: Surgical sutures EENT: No conjunctival injection or scleral icterus.  Lungs: Resp effort normal Cardiac: Regular rate Abdomen: Non-distended abdomen Skin: No rashes cyanosis or petechiae. Extremities: No clubbing or edema  Neurologic Exam: Mental Status: Awake, alert, attentive to examiner. Oriented to self and environment. Language is modestly impaired with regards to fluency.  Pseudobulbar elements. Cranial Nerves: Visual acuity is grossly normal. Visual fields are full. Extra-ocular movements intact. No ptosis. Face is symmetric Motor: Tone and bulk are normal. Power is 4+/5 in right arm and leg. Reflexes are symmetric, no pathologic reflexes present.  Sensory: Intact to light  touch Gait: Hemiparetic, non ambulatory   Labs: I have reviewed the data as listed    Component Value Date/Time   NA 138 09/07/2023 1002   NA 140 10/04/2018 1703   K 4.0 09/07/2023 1002   CL 105 09/07/2023 1002   CO2 25 09/07/2023 1002   GLUCOSE 118 (H) 09/07/2023 1002   BUN 25 (H) 09/07/2023 1002   BUN 24 10/04/2018 1703   CREATININE 1.16 (H) 09/07/2023 1002   CREATININE  1.06 (H) 01/29/2016 1518   CALCIUM 9.6 09/07/2023 1002   PROT 6.8 09/07/2023 1002   PROT 6.8 10/04/2018 1703   ALBUMIN 4.3 09/07/2023 1002   ALBUMIN 4.6 10/04/2018 1703   AST 17 09/07/2023 1002   ALT 32 09/07/2023 1002   ALKPHOS 60 09/07/2023 1002   BILITOT 0.5 09/07/2023 1002   GFRNONAA 52 (L) 09/07/2023 1002   GFRNONAA 58 (L) 01/29/2016 1518   GFRAA 70 10/04/2018 1703   GFRAA 67 01/29/2016 1518   Lab Results  Component Value Date   WBC 20.6 (H) 09/17/2023   NEUTROABS 17.3 (H) 09/17/2023   HGB 15.9 (H) 09/17/2023   HCT 45.6 09/17/2023   MCV 101.6 (H) 09/17/2023   PLT 213 09/17/2023    Assessment/Plan Glioblastoma multiforme of frontal lobe (HCC)  Focal seizures (HCC)  Mont Dutton presents today for initiation of CCNU and avastin.     Patient elected to proceed with cycle #1 oral CCNU 90mg /m2 q6 weeks and avastin 10mg /kg IV 2q weeks.  Avastin will be helpful as concurrent therapy given burden of enhancing tumor and steroid requirement.  We reviewed side effects of CCNU, including fatigue, nausea vomiting, cytopenias, ILD.  We reviewed side effects of avastin, including hypertension, bleeding/clotting events, wound healing impairment.  The patient will have a complete blood count, a comprehensive metabolic panel, and urine protein performed prior to each avastin infusion. Labs may need to be performed more often. Zofran will prescribed for home use for nausea/vomiting.    Informed consent was obtained verbally at bedside to proceed with oral chemotherapy and avastin.  Chemotherapy should be  held for the following:  ANC less than 1,000  Platelets less than 100,000  LFT or creatinine greater than 2x ULN  If clinical concerns/contraindications develop   Avastin should be held for the following:  ANC less than 500  Platelets less than 50,000  LFT or creatinine greater than 2x ULN  If clinical concerns/contraindications develop  Decadron can decrease to 4mg  daily x5 days, then 2mg  daily thereafter.  Ok with zyprexa prior to and following CCNU dosing tonight.  We also recommended she continue Vimpat 100mg  BID and Topiramate 50mg  BID for now.  We ask that Mont Dutton return to clinic in two weeks for cycle #1, day 15 avastin/CCNU, or sooner if needed.  All questions were answered. The patient knows to call the clinic with any problems, questions or concerns. No barriers to learning were detected.  The total time spent in the encounter was 40 minutes and more than 50% was on counseling and review of test results   Henreitta Leber, MD Medical Director of Neuro-Oncology [facility] at [place] 09/17/23 12:11 PM

## 2023-09-17 NOTE — Telephone Encounter (Signed)
Oral Oncology Patient Advocate Encounter  Gleostine delivered to office and brought to patient while here for clinic.   Jinger Neighbors, CPhT-Adv Oncology Pharmacy Patient Advocate [facility] Direct Number: [phone]  Fax: [phone]

## 2023-09-18 ENCOUNTER — Telehealth: Payer: Self-pay

## 2023-09-18 NOTE — Telephone Encounter (Signed)
Son Jomarie Longs states that his mom Jennifer [name] is doing fine. She is eating, drinking, and urinating well. They knows to call the office at [phone] if  she has any questions or concerns.

## 2023-09-18 NOTE — Telephone Encounter (Signed)
-----   Message from Nurse Cortney P sent at 09/17/2023  2:05 PM EST ----- Regarding: [facility]low - first time Mvasi Dr Barbaraann Cao patient, first time Mvasi - pt tolerated tx well, no complaints.

## 2023-09-21 ENCOUNTER — Inpatient Hospital Stay: Payer: Medicare Other

## 2023-09-21 ENCOUNTER — Encounter: Payer: Self-pay | Admitting: Internal Medicine

## 2023-09-21 DIAGNOSIS — C711 Malignant neoplasm of frontal lobe: Secondary | ICD-10-CM

## 2023-09-21 DIAGNOSIS — Z5112 Encounter for antineoplastic immunotherapy: Secondary | ICD-10-CM | POA: Diagnosis not present

## 2023-09-21 LAB — CBC WITH DIFFERENTIAL (CANCER CENTER ONLY)
Abs Immature Granulocytes: 0.3 10*3/uL — ABNORMAL HIGH (ref 0.00–0.07)
Basophils Absolute: 0 10*3/uL (ref 0.0–0.1)
Basophils Relative: 0 %
Eosinophils Absolute: 0 10*3/uL (ref 0.0–0.5)
Eosinophils Relative: 0 %
HCT: 43 % (ref 36.0–46.0)
Hemoglobin: 14.9 g/dL (ref 12.0–15.0)
Immature Granulocytes: 2 %
Lymphocytes Relative: 4 %
Lymphs Abs: 0.6 10*3/uL — ABNORMAL LOW (ref 0.7–4.0)
MCH: 35.6 pg — ABNORMAL HIGH (ref 26.0–34.0)
MCHC: 34.7 g/dL (ref 30.0–36.0)
MCV: 102.9 fL — ABNORMAL HIGH (ref 80.0–100.0)
Monocytes Absolute: 0.6 10*3/uL (ref 0.1–1.0)
Monocytes Relative: 4 %
Neutro Abs: 14.2 10*3/uL — ABNORMAL HIGH (ref 1.7–7.7)
Neutrophils Relative %: 90 %
Platelet Count: 161 10*3/uL (ref 150–400)
RBC: 4.18 MIL/uL (ref 3.87–5.11)
RDW: 13.4 % (ref 11.5–15.5)
WBC Count: 15.8 10*3/uL — ABNORMAL HIGH (ref 4.0–10.5)
nRBC: 0 % (ref 0.0–0.2)

## 2023-09-21 LAB — CMP (CANCER CENTER ONLY)
ALT: 64 U/L — ABNORMAL HIGH (ref 0–44)
AST: 36 U/L (ref 15–41)
Albumin: 3.7 g/dL (ref 3.5–5.0)
Alkaline Phosphatase: 60 U/L (ref 38–126)
Anion gap: 6 (ref 5–15)
BUN: 23 mg/dL (ref 8–23)
CO2: 25 mmol/L (ref 22–32)
Calcium: 9.1 mg/dL (ref 8.9–10.3)
Chloride: 109 mmol/L (ref 98–111)
Creatinine: 1.01 mg/dL — ABNORMAL HIGH (ref 0.44–1.00)
GFR, Estimated: >60 mL/min (ref >60)
Glucose, Bld: 116 mg/dL — ABNORMAL HIGH (ref 70–99)
Potassium: 3.9 mmol/L (ref 3.5–5.1)
Sodium: 140 mmol/L (ref 135–145)
Total Bilirubin: 0.6 mg/dL (ref 0.0–1.2)
Total Protein: 6.1 g/dL — ABNORMAL LOW (ref 6.5–8.1)

## 2023-09-29 ENCOUNTER — Inpatient Hospital Stay (HOSPITAL_BASED_OUTPATIENT_CLINIC_OR_DEPARTMENT_OTHER): Payer: Medicare Other | Admitting: Internal Medicine

## 2023-09-29 ENCOUNTER — Inpatient Hospital Stay: Payer: Medicare Other | Attending: Internal Medicine

## 2023-09-29 ENCOUNTER — Inpatient Hospital Stay: Payer: Medicare Other

## 2023-09-29 VITALS — BP 139/77 | HR 103 | Temp 97.5°F | Resp 17 | Wt 163.9 lb

## 2023-09-29 DIAGNOSIS — Z923 Personal history of irradiation: Secondary | ICD-10-CM | POA: Diagnosis not present

## 2023-09-29 DIAGNOSIS — C711 Malignant neoplasm of frontal lobe: Secondary | ICD-10-CM | POA: Diagnosis not present

## 2023-09-29 DIAGNOSIS — Z5112 Encounter for antineoplastic immunotherapy: Secondary | ICD-10-CM | POA: Insufficient documentation

## 2023-09-29 DIAGNOSIS — G40909 Epilepsy, unspecified, not intractable, without status epilepticus: Secondary | ICD-10-CM | POA: Diagnosis not present

## 2023-09-29 DIAGNOSIS — Z79899 Other long term (current) drug therapy: Secondary | ICD-10-CM | POA: Insufficient documentation

## 2023-09-29 DIAGNOSIS — Z7952 Long term (current) use of systemic steroids: Secondary | ICD-10-CM | POA: Diagnosis not present

## 2023-09-29 LAB — CBC WITH DIFFERENTIAL (CANCER CENTER ONLY)
Abs Immature Granulocytes: 0.19 10*3/uL — ABNORMAL HIGH (ref 0.00–0.07)
Basophils Absolute: 0 10*3/uL (ref 0.0–0.1)
Basophils Relative: 0 %
Eosinophils Absolute: 0 10*3/uL (ref 0.0–0.5)
Eosinophils Relative: 0 %
HCT: 42.6 % (ref 36.0–46.0)
Hemoglobin: 14.5 g/dL (ref 12.0–15.0)
Immature Granulocytes: 2 %
Lymphocytes Relative: 5 %
Lymphs Abs: 0.5 10*3/uL — ABNORMAL LOW (ref 0.7–4.0)
MCH: 36.1 pg — ABNORMAL HIGH (ref 26.0–34.0)
MCHC: 34 g/dL (ref 30.0–36.0)
MCV: 106 fL — ABNORMAL HIGH (ref 80.0–100.0)
Monocytes Absolute: 0.5 10*3/uL (ref 0.1–1.0)
Monocytes Relative: 5 %
Neutro Abs: 8.4 10*3/uL — ABNORMAL HIGH (ref 1.7–7.7)
Neutrophils Relative %: 88 %
Platelet Count: 154 10*3/uL (ref 150–400)
RBC: 4.02 MIL/uL (ref 3.87–5.11)
RDW: 13.2 % (ref 11.5–15.5)
WBC Count: 9.6 10*3/uL (ref 4.0–10.5)
nRBC: 0 % (ref 0.0–0.2)

## 2023-09-29 LAB — CMP (CANCER CENTER ONLY)
ALT: 45 U/L — ABNORMAL HIGH (ref 0–44)
AST: 18 U/L (ref 15–41)
Albumin: 3.9 g/dL (ref 3.5–5.0)
Alkaline Phosphatase: 67 U/L (ref 38–126)
Anion gap: 8 (ref 5–15)
BUN: 24 mg/dL — ABNORMAL HIGH (ref 8–23)
CO2: 25 mmol/L (ref 22–32)
Calcium: 9.3 mg/dL (ref 8.9–10.3)
Chloride: 111 mmol/L (ref 98–111)
Creatinine: 1.08 mg/dL — ABNORMAL HIGH (ref 0.44–1.00)
GFR, Estimated: 57 mL/min — ABNORMAL LOW (ref >60)
Glucose, Bld: 147 mg/dL — ABNORMAL HIGH (ref 70–99)
Potassium: 4.1 mmol/L (ref 3.5–5.1)
Sodium: 144 mmol/L (ref 135–145)
Total Bilirubin: 0.6 mg/dL (ref 0.0–1.2)
Total Protein: 6.5 g/dL (ref 6.5–8.1)

## 2023-09-29 MED ORDER — SODIUM CHLORIDE 0.9 % IV SOLN
10.0000 mg/kg | Freq: Once | INTRAVENOUS | Status: AC
  Administered 2023-09-29: 800 mg via INTRAVENOUS
  Filled 2023-09-29: qty 32

## 2023-09-29 MED ORDER — DEXAMETHASONE 1 MG PO TABS: 1.0000 mg | ORAL_TABLET | Freq: Every day | ORAL | 0 refills | Status: DC

## 2023-09-29 MED ORDER — SODIUM CHLORIDE 0.9 % IV SOLN
INTRAVENOUS | Status: DC
Start: 2023-09-29 — End: 2023-09-29

## 2023-09-29 NOTE — Progress Notes (Signed)
 [facility] [address]  [place], [place] [address] [phone]   Interval Evaluation  Date of Service: 09/29/23 [name] Patient MRN: [id number] Patient DOB: 11-Jun-1956 Provider: Arthea MARLA Manns, MD  Identifying Statement:  Jennifer [name] is a 68 y.o. female with left frontal glioblastoma   Oncologic History: Oncology History  Glioblastoma multiforme of frontal lobe (HCC)  04/11/2022 Surgery   Craniotomy, resection of left frontal mass with Dr. Gillie; path is glioblastoma IDHwt.   05/12/2022 - 06/23/2022 Radiation Therapy   IMRT and concurrent Temodar  75mg /m2   07/21/2022 - 03/31/2023 Chemotherapy   Completes 7 cycles of 5-day Temodar    04/23/2023 Progression   Progression of disease   05/06/2023 Surgery   Second craniotomy, resection with Dr. Gillie   09/17/2023 -  Chemotherapy   Patient is on Treatment Plan : BRAIN Bevacizumab  D1,15,29 + Lomustine  (90) D1 q42d       Biomarkers:  MGMT Unknown.  IDH 1/2 Wild type.  EGFR Unknown  TERT Unknown   Interval History: Jennifer [name] presents today for avastin  infusion.  Still having confusion and imbalance; she repeats words over and over.  Decadron  currently at 2mg  daily. No additional seizures since prior visit.  Vimpat  continues at 100mg  BID, continues on Topamax  50mg  BID as well.     H+P (05/05/22) Patient presented to neurologic attention in mid-August 2023 with new onset difficulty speaking, confusion noticed by family.  She and her family described sudden onset speech impairment and confusion in the afternoon of the 16th, after having been seen in normal state of health that morning.  CNS imaging demonstrated an enhancing left frontal mass, which was resected by Dr. Gillie on 04/11/22.  Following surgery she had some speech difficulty, but improved quickly through rehab admission.  At present, she is fully independent with gait, speech, ADLs.  She is off  decadron  and Keppra .  Retired, had worked at [employer] in [employer].  Today presents with son and daughter in law.    Medications: Current Outpatient Medications on File Prior to Visit  Medication Sig Dispense Refill   amLODipine  (NORVASC ) 5 MG tablet TAKE 1 TABLET BY MOUTH EVERY DAY (Patient taking differently: Take 5 mg by mouth daily.) 90 tablet 1   atorvastatin  (LIPITOR) 10 MG tablet TAKE 1 TABLET BY MOUTH EVERY DAY (Patient taking differently: Take 10 mg by mouth daily.) 90 tablet 1   busPIRone  (BUSPAR ) 5 MG tablet TAKE 1 TABLET BY MOUTH THREE TIMES A DAY (Patient taking differently: Take 5 mg by mouth 3 (three) times daily.) 270 tablet 1   dexamethasone  (DECADRON ) 4 MG tablet Take 4 mg by mouth 2 (two) times daily.     fluticasone  (FLONASE ) 50 MCG/ACT nasal spray SPRAY 2 SPRAYS INTO EACH NOSTRIL EVERY DAY 48 mL 0   Lacosamide  150 MG TABS Take 1 tablet (150 mg total) by mouth in the morning and at bedtime. 60 tablet 2   lomustine  (GLEOSTINE ) 10 MG capsule Take 2 capsules (20 mg total) by mouth once for 1 dose. Take on an empty stomach 1 hour before or 2 hours after meals. Caution:chemotherapy. 2 capsule 0   lomustine  (GLEOSTINE ) 100 MG capsule Take 1 capsule (100 mg total) by mouth once for 1 dose. Take on an empty stomach 1 hour before or 2 hours after meals. Caution:chemotherapy 1 capsule 0   lomustine  (GLEOSTINE ) 40 MG capsule Take 1 capsule (40 mg total) by mouth once for 1  dose. Take on an empty stomach 1 hour before or 2 hours after meals. Caution:chemotherapy. 1 capsule 0   LORazepam  (ATIVAN ) 2 MG tablet Take 1 tablet (2 mg total) by mouth every 8 (eight) hours as needed for seizure. 10 tablet 0   losartan  (COZAAR ) 50 MG tablet TAKE 1 TABLET BY MOUTH EVERY DAY (Patient taking differently: Take 50 mg by mouth daily.) 90 tablet 1   OLANZapine  (ZYPREXA ) 5 MG tablet Take 1 tablet (5 mg total) by mouth at bedtime. Take during 5 nights of chemotherapy only 5 tablet 0   ondansetron  (ZOFRAN ) 8 MG  tablet Take 1 tablet (8 mg total) by mouth every 8 (eight) hours as needed for nausea or vomiting. 30 tablet 1   SUMAtriptan  (IMITREX ) 50 MG tablet Take 50 mg by mouth every 2 (two) hours as needed for migraine. May repeat in 2 hours if headache persists or recurs.     topiramate  (TOPAMAX ) 50 MG tablet TAKE 1 TABLET BY MOUTH TWICE A DAY (Patient taking differently: Take 50 mg by mouth 2 (two) times daily.) 60 tablet 0   traZODone  (DESYREL ) 50 MG tablet Take 1 tablet (50 mg total) by mouth at bedtime as needed for sleep. 30 tablet 2   No current facility-administered medications on file prior to visit.    Allergies: No Known Allergies Past Medical History:  Past Medical History:  Diagnosis Date   Brain cancer (HCC)    Fibromyalgia    Hyperlipidemia    Hypertension    Migraine    Seizures (HCC)    Past Surgical History:  Past Surgical History:  Procedure Laterality Date   APPLICATION OF CRANIAL NAVIGATION N/A 04/11/2022   Procedure: APPLICATION OF CRANIAL NAVIGATION;  Surgeon: Gillie Duncans, MD;  Location: [facility] OR;  Service: Neurosurgery;  Laterality: N/A;   APPLICATION OF CRANIAL NAVIGATION Left 05/06/2023   Procedure: APPLICATION OF CRANIAL NAVIGATION;  Surgeon: Gillie Duncans, MD;  Location: [facility] OR;  Service: Neurosurgery;  Laterality: Left;   COLONOSCOPY     CRANIOTOMY Left 04/11/2022   Procedure: LEFT FRONTAL CRANIOTOMY FOR RESECTION OF TUMOR WITH SONDRA;  Surgeon: Gillie Duncans, MD;  Location: [facility] OR;  Service: Neurosurgery;  Laterality: Left;  RM 21   CRANIOTOMY Left 05/06/2023   Procedure: REDO LEFT CRANIOTOMY FOR TUMOR WITH STEALTH;  Surgeon: Gillie Duncans, MD;  Location: [facility] OR;  Service: Neurosurgery;  Laterality: Left;   LAPAROSCOPIC APPENDECTOMY  07/15/2012   Procedure: APPENDECTOMY LAPAROSCOPIC;  Surgeon: Dann FORBES Hummer, MD;  Location: [facility] OR;  Service: General;  Laterality: N/A;  Laparoscopic Appendectomy   Social History:  Social History   Socioeconomic History    Marital status: Widowed    Spouse name: Not on file   Number of children: 2   Years of education: Not on file   Highest education level: Not on file  Occupational History   Occupation: retail    Comment: [employer] in [place], [place]  Tobacco Use   Smoking status: Never   Smokeless tobacco: Never  Vaping Use   Vaping status: Never Used  Substance and Sexual Activity   Alcohol  use: Not Currently    Comment: 2 drinks once every 2-3 months   Drug use: No   Sexual activity: Not Currently    Birth control/protection: Post-menopausal  Other Topics Concern   Not on file  Social History Narrative   Marital status: widowed since 2008; husband was bipolar and alcoholic      Children: 2 children (28, 66); son lives with  patient.Daughter lives in [place]/autism/does not drive.  Daughter lives with mother-in-law.  One grandpuppy      Lives: with son      Employment: works at [employer]; also works in [employer] business.      Tobacco: none      Alcohol : none; husband was an alcoholic.      Drugs:   none      Exercise:   Social Drivers of Corporate Investment Banker Strain: Low Risk  (02/26/2022)   Received from [facility]   Overall Financial Resource Strain (CARDIA)  Food Insecurity: No Food Insecurity (02/26/2022)   Received from [facility]   Hunger Vital Sign  Transportation Needs: No Transportation Needs (02/26/2022)   Received from [facility] - Transportation  Physical Activity: Insufficiently Active (02/26/2022)   Received from [facility]   Exercise Vital Sign  Stress: Stress Concern Present (02/26/2022)   Received from [facility]   [employer] of Occupational Health - Occupational Stress Questionnaire  Social Connections: Socially Isolated (02/26/2022)   Received from [facility]   Social Connection and Isolation Panel [NHANES]  Intimate Partner Violence: Not At Risk (02/26/2022)   Received from [facility] visits prior to  10/25/2022., [facility] visits prior to 10/25/2022.   Humiliation, Afraid, Rape, and Kick questionnaire    Fear of Current or Ex-Partner: No    Emotionally Abused: No    Physically Abused: No    Sexually Abused: No   Family History:  Family History  Adopted: Yes    Review of Systems: Constitutional: Doesn't report fevers, chills or abnormal weight loss Eyes: Doesn't report blurriness of vision Ears, nose, mouth, throat, and face: Doesn't report sore throat Respiratory: Doesn't report cough, dyspnea or wheezes Cardiovascular: Doesn't report palpitation, chest discomfort  Gastrointestinal:  Doesn't report nausea, constipation, diarrhea GU: Doesn't report incontinence Skin: Doesn't report skin rashes Neurological: Per HPI Musculoskeletal: Doesn't report joint pain Behavioral/Psych: Doesn't report anxiety  Physical Exam: Vitals:   09/29/23 0905  BP: 139/77  Pulse: (!) 103  Resp: 17  Temp: (!) 97.5 F (36.4 C)  SpO2: 100%   KPS: 70 General: Alert, cooperative, pleasant, in no acute distress Head: Surgical sutures EENT: No conjunctival injection or scleral icterus.  Lungs: Resp effort normal Cardiac: Regular rate Abdomen: Non-distended abdomen Skin: No rashes cyanosis or petechiae. Extremities: No clubbing or edema  Neurologic Exam: Mental Status: Awake, alert, attentive to examiner. Oriented to self and environment. Language is modestly impaired with regards to fluency.  Pseudobulbar elements. Cranial Nerves: Visual acuity is grossly normal. Visual fields are full. Extra-ocular movements intact. No ptosis. Face is symmetric Motor: Tone and bulk are normal. Power is 4+/5 in right arm and leg. Reflexes are symmetric, no pathologic reflexes present.  Sensory: Intact to light touch Gait: Hemiparetic, non ambulatory   Labs: I have reviewed the data as listed    Component Value Date/Time   NA 140 09/21/2023 0854   NA 140 10/04/2018 1703   K 3.9  09/21/2023 0854   CL 109 09/21/2023 0854   CO2 25 09/21/2023 0854   GLUCOSE 116 (H) 09/21/2023 0854   BUN 23 09/21/2023 0854   BUN 24 10/04/2018 1703   CREATININE 1.01 (H) 09/21/2023 0854   CREATININE 1.06 (H) 01/29/2016 1518   CALCIUM  9.1 09/21/2023 0854   PROT 6.1 (L) 09/21/2023 0854   PROT 6.8 10/04/2018 1703   ALBUMIN  3.7 09/21/2023 0854   ALBUMIN  4.6  10/04/2018 1703   AST 36 09/21/2023 0854   ALT 64 (H) 09/21/2023 0854   ALKPHOS 60 09/21/2023 0854   BILITOT 0.6 09/21/2023 0854   GFRNONAA >60 09/21/2023 0854   GFRNONAA 58 (L) 01/29/2016 1518   GFRAA 70 10/04/2018 1703   GFRAA 67 01/29/2016 1518   Lab Results  Component Value Date   WBC 9.6 09/29/2023   NEUTROABS 8.4 (H) 09/29/2023   HGB 14.5 09/29/2023   HCT 42.6 09/29/2023   MCV 106.0 (H) 09/29/2023   PLT 154 09/29/2023    Assessment/Plan Glioblastoma multiforme of frontal lobe (HCC)  Seizure disorder (HCC)  Jennifer [name] presents today for cycle #1, day 15/42 CCNU and avastin .  CCNU was dosed 09/18/23.   Patient will continue with cycle #1 oral CCNU 90mg /m2 q6 weeks and avastin  10mg /kg IV 2q weeks.  Avastin  will be helpful as concurrent therapy given burden of enhancing tumor and steroid requirement.  We reviewed side effects of CCNU, including fatigue, nausea vomiting, cytopenias, ILD.  We reviewed side effects of avastin , including hypertension, bleeding/clotting events, wound healing impairment.  The patient will have a complete blood count, a comprehensive metabolic panel, and urine protein performed prior to each avastin  infusion. Labs may need to be performed more often. Zofran  will prescribed for home use for nausea/vomiting.    Informed consent was obtained verbally at bedside to proceed with oral chemotherapy and avastin .  Chemotherapy should be held for the following:  ANC less than 1,000  Platelets less than 100,000  LFT or creatinine greater than 2x ULN  If clinical concerns/contraindications  develop   Avastin  should be held for the following:  ANC less than 500  Platelets less than 50,000  LFT or creatinine greater than 2x ULN  If clinical concerns/contraindications develop  Decadron  can decrease to 1mg  daily if tolerated.  We also recommended she continue Vimpat  100mg  BID and Topiramate  50mg  BID for now.  We ask that Jennifer [name] return to clinic in two weeks for cycle #1, day 29 avastin /CCNU, or sooner if needed.  All questions were answered. The patient knows to call the clinic with any problems, questions or concerns. No barriers to learning were detected.  The total time spent in the encounter was 40 minutes and more than 50% was on counseling and review of test results   Arthea MARLA Manns, MD Medical Director of Neuro-Oncology [facility] at [place] 09/29/23 8:59 AM

## 2023-09-29 NOTE — Patient Instructions (Signed)
 [facility] - A DEPT OF [facility]  Discharge Instructions: Thank you for choosing [facility] to provide your oncology and hematology care.   If you have a lab appointment with the Cancer Center, please go directly to the Cancer Center and check in at the registration area.   Wear comfortable clothing and clothing appropriate for easy access to any Portacath or PICC line.   We strive to give you quality time with your provider. You may need to reschedule your appointment if you arrive late (15 or more minutes).  Arriving late affects you and other patients whose appointments are after yours.  Also, if you miss three or more appointments without notifying the office, you may be dismissed from the clinic at the provider's discretion.      For prescription refill requests, have your pharmacy contact our office and allow 72 hours for refills to be completed.    Today you received the following chemotherapy and/or immunotherapy agents Mvasi      To help prevent nausea and vomiting after your treatment, we encourage you to take your nausea medication as directed.  BELOW ARE SYMPTOMS THAT SHOULD BE REPORTED IMMEDIATELY: *FEVER GREATER THAN 100.4 F (38 C) OR HIGHER *CHILLS OR SWEATING *NAUSEA AND VOMITING THAT IS NOT CONTROLLED WITH YOUR NAUSEA MEDICATION *UNUSUAL SHORTNESS OF BREATH *UNUSUAL BRUISING OR BLEEDING *URINARY PROBLEMS (pain or burning when urinating, or frequent urination) *BOWEL PROBLEMS (unusual diarrhea, constipation, pain near the anus) TENDERNESS IN MOUTH AND THROAT WITH OR WITHOUT PRESENCE OF ULCERS (sore throat, sores in mouth, or a toothache) UNUSUAL RASH, SWELLING OR PAIN  UNUSUAL VAGINAL DISCHARGE OR ITCHING   Items with * indicate a potential emergency and should be followed up as soon as possible or go to the Emergency Department if any problems should occur.  Please show the CHEMOTHERAPY ALERT CARD or IMMUNOTHERAPY ALERT  CARD at check-in to the Emergency Department and triage nurse.  Should you have questions after your visit or need to cancel or reschedule your appointment, please contact [facility] - A DEPT OF [facility]  Dept: [phone]  and follow the prompts.  Office hours are 8:00 a.m. to 4:30 p.m. Monday - Friday. Please note that voicemails left after 4:00 p.m. may not be returned until the following business day.  We are closed weekends and major holidays. You have access to a nurse at all times for urgent questions. Please call the main number to the clinic Dept: [phone] and follow the prompts.   For any non-urgent questions, you may also contact your provider using MyChart. We now offer e-Visits for anyone 38 and older to request care online for non-urgent symptoms. For details visit [email or web address].   Also download the MyChart app! Go to the app store, search "MyChart", open the app, select [place], and log in with your MyChart username and password.

## 2023-10-05 ENCOUNTER — Inpatient Hospital Stay: Payer: Medicare Other

## 2023-10-05 DIAGNOSIS — C711 Malignant neoplasm of frontal lobe: Secondary | ICD-10-CM

## 2023-10-05 DIAGNOSIS — Z5112 Encounter for antineoplastic immunotherapy: Secondary | ICD-10-CM | POA: Diagnosis not present

## 2023-10-05 LAB — CMP (CANCER CENTER ONLY)
ALT: 26 U/L (ref 0–44)
AST: 17 U/L (ref 15–41)
Albumin: 3.9 g/dL (ref 3.5–5.0)
Alkaline Phosphatase: 64 U/L (ref 38–126)
Anion gap: 8 (ref 5–15)
BUN: 26 mg/dL — ABNORMAL HIGH (ref 8–23)
CO2: 26 mmol/L (ref 22–32)
Calcium: 9.7 mg/dL (ref 8.9–10.3)
Chloride: 109 mmol/L (ref 98–111)
Creatinine: 1.18 mg/dL — ABNORMAL HIGH (ref 0.44–1.00)
GFR, Estimated: 51 mL/min — ABNORMAL LOW (ref >60)
Glucose, Bld: 152 mg/dL — ABNORMAL HIGH (ref 70–99)
Potassium: 4.1 mmol/L (ref 3.5–5.1)
Sodium: 143 mmol/L (ref 135–145)
Total Bilirubin: 0.8 mg/dL (ref 0.0–1.2)
Total Protein: 6.7 g/dL (ref 6.5–8.1)

## 2023-10-05 LAB — CBC WITH DIFFERENTIAL (CANCER CENTER ONLY)
Abs Immature Granulocytes: 0.11 10*3/uL — ABNORMAL HIGH (ref 0.00–0.07)
Basophils Absolute: 0.1 10*3/uL (ref 0.0–0.1)
Basophils Relative: 1 %
Eosinophils Absolute: 0 10*3/uL (ref 0.0–0.5)
Eosinophils Relative: 0 %
HCT: 41.5 % (ref 36.0–46.0)
Hemoglobin: 14.1 g/dL (ref 12.0–15.0)
Immature Granulocytes: 2 %
Lymphocytes Relative: 5 %
Lymphs Abs: 0.3 10*3/uL — ABNORMAL LOW (ref 0.7–4.0)
MCH: 35.5 pg — ABNORMAL HIGH (ref 26.0–34.0)
MCHC: 34 g/dL (ref 30.0–36.0)
MCV: 104.5 fL — ABNORMAL HIGH (ref 80.0–100.0)
Monocytes Absolute: 0.6 10*3/uL (ref 0.1–1.0)
Monocytes Relative: 8 %
Neutro Abs: 6.3 10*3/uL (ref 1.7–7.7)
Neutrophils Relative %: 84 %
Platelet Count: 120 10*3/uL — ABNORMAL LOW (ref 150–400)
RBC: 3.97 MIL/uL (ref 3.87–5.11)
RDW: 13.3 % (ref 11.5–15.5)
WBC Count: 7.4 10*3/uL (ref 4.0–10.5)
nRBC: 0 % (ref 0.0–0.2)

## 2023-10-06 ENCOUNTER — Other Ambulatory Visit: Payer: Self-pay

## 2023-10-06 ENCOUNTER — Ambulatory Visit: Payer: Medicare Other

## 2023-10-13 ENCOUNTER — Inpatient Hospital Stay: Payer: Medicare Other

## 2023-10-13 ENCOUNTER — Encounter: Payer: Self-pay | Admitting: Internal Medicine

## 2023-10-13 ENCOUNTER — Inpatient Hospital Stay: Payer: Medicare Other | Admitting: Internal Medicine

## 2023-10-13 VITALS — BP 101/75 | HR 110 | Temp 97.5°F | Resp 18

## 2023-10-13 DIAGNOSIS — C711 Malignant neoplasm of frontal lobe: Secondary | ICD-10-CM | POA: Diagnosis not present

## 2023-10-13 DIAGNOSIS — Z5112 Encounter for antineoplastic immunotherapy: Secondary | ICD-10-CM | POA: Diagnosis not present

## 2023-10-13 DIAGNOSIS — G40909 Epilepsy, unspecified, not intractable, without status epilepticus: Secondary | ICD-10-CM

## 2023-10-13 LAB — CBC WITH DIFFERENTIAL (CANCER CENTER ONLY)
Abs Immature Granulocytes: 0.08 10*3/uL — ABNORMAL HIGH (ref 0.00–0.07)
Basophils Absolute: 0 10*3/uL (ref 0.0–0.1)
Basophils Relative: 0 %
Eosinophils Absolute: 0 10*3/uL (ref 0.0–0.5)
Eosinophils Relative: 0 %
HCT: 37.7 % (ref 36.0–46.0)
Hemoglobin: 12.9 g/dL (ref 12.0–15.0)
Immature Granulocytes: 1 %
Lymphocytes Relative: 5 %
Lymphs Abs: 0.5 10*3/uL — ABNORMAL LOW (ref 0.7–4.0)
MCH: 35.3 pg — ABNORMAL HIGH (ref 26.0–34.0)
MCHC: 34.2 g/dL (ref 30.0–36.0)
MCV: 103.3 fL — ABNORMAL HIGH (ref 80.0–100.0)
Monocytes Absolute: 0.4 10*3/uL (ref 0.1–1.0)
Monocytes Relative: 5 %
Neutro Abs: 8.2 10*3/uL — ABNORMAL HIGH (ref 1.7–7.7)
Neutrophils Relative %: 89 %
Platelet Count: 119 10*3/uL — ABNORMAL LOW (ref 150–400)
RBC: 3.65 MIL/uL — ABNORMAL LOW (ref 3.87–5.11)
RDW: 13.5 % (ref 11.5–15.5)
WBC Count: 9.2 10*3/uL (ref 4.0–10.5)
nRBC: 0 % (ref 0.0–0.2)

## 2023-10-13 LAB — CMP (CANCER CENTER ONLY)
ALT: 22 U/L (ref 0–44)
AST: 15 U/L (ref 15–41)
Albumin: 3.8 g/dL (ref 3.5–5.0)
Alkaline Phosphatase: 71 U/L (ref 38–126)
Anion gap: 8 (ref 5–15)
BUN: 22 mg/dL (ref 8–23)
CO2: 22 mmol/L (ref 22–32)
Calcium: 9.3 mg/dL (ref 8.9–10.3)
Chloride: 115 mmol/L — ABNORMAL HIGH (ref 98–111)
Creatinine: 1.03 mg/dL — ABNORMAL HIGH (ref 0.44–1.00)
GFR, Estimated: 60 mL/min — ABNORMAL LOW (ref >60)
Glucose, Bld: 144 mg/dL — ABNORMAL HIGH (ref 70–99)
Potassium: 4.2 mmol/L (ref 3.5–5.1)
Sodium: 145 mmol/L (ref 135–145)
Total Bilirubin: 0.7 mg/dL (ref 0.0–1.2)
Total Protein: 6.5 g/dL (ref 6.5–8.1)

## 2023-10-13 MED ORDER — SODIUM CHLORIDE 0.9 % IV SOLN
10.0000 mg/kg | Freq: Once | INTRAVENOUS | Status: AC
  Administered 2023-10-13: 800 mg via INTRAVENOUS
  Filled 2023-10-13: qty 32

## 2023-10-13 MED ORDER — SODIUM CHLORIDE 0.9 % IV SOLN: INTRAVENOUS | Status: DC

## 2023-10-13 NOTE — Progress Notes (Signed)
[facility] at [facility] [address]  [place], [place] [address] [phone]   Interval Evaluation  Date of Service: 10/13/23 [name] Patient MRN: [id number] Patient DOB: [birth year] Provider: Henreitta Leber, MD  Identifying Statement:  Jennifer [name] is a 68 y.o. female with left frontal glioblastoma   Oncologic History: Oncology History  Glioblastoma multiforme of frontal lobe (HCC)  04/11/2022 Surgery   Craniotomy, resection of left frontal mass with Dr. Franky Macho; path is glioblastoma IDHwt.   05/12/2022 - 06/23/2022 Radiation Therapy   IMRT and concurrent Temodar 75mg /m2   07/21/2022 - 03/31/2023 Chemotherapy   Completes 7 cycles of 5-day Temodar   04/23/2023 Progression   Progression of disease   05/06/2023 Surgery   Second craniotomy, resection with Dr. Franky Macho   09/17/2023 -  Chemotherapy   Patient is on Treatment Plan : BRAIN Bevacizumab D1,15,29 + Lomustine (90) D1 q42d       Biomarkers:  MGMT Unknown.  IDH 1/2 Wild type.  EGFR Unknown  TERT Unknown   Interval History: Jennifer [name] presents today for avastin infusion.  She continues to get weaker with the right arm and leg.  Now using a wheelchair even in the home.  Still having confusion and imbalance; she "repeats words over and over".  Decadron has been reduced to 1mg  daily. No additional seizures since prior visit.  Vimpat continues at 100mg  BID, continues on Topamax 50mg  BID as well.     H+P (05/05/22) Patient presented to neurologic attention in mid-August 2023 with new onset difficulty speaking, confusion noticed by family.  She and her family described sudden onset speech impairment and confusion in the afternoon of the 16th, after having been seen in normal state of health that morning.  CNS imaging demonstrated an enhancing left frontal mass, which was resected by Dr. Franky Macho on 04/11/22.  Following surgery she had some speech difficulty, but improved  quickly through rehab admission.  At present, she is fully independent with gait, speech, ADLs.  She is off decadron and Keppra.  Retired, had worked at [employer] in [employer].  Today presents with son and daughter in law.    Medications: Current Outpatient Medications on File Prior to Visit  Medication Sig Dispense Refill   amLODipine (NORVASC) 5 MG tablet TAKE 1 TABLET BY MOUTH EVERY DAY (Patient taking differently: Take 5 mg by mouth daily.) 90 tablet 1   atorvastatin (LIPITOR) 10 MG tablet TAKE 1 TABLET BY MOUTH EVERY DAY (Patient taking differently: Take 10 mg by mouth daily.) 90 tablet 1   busPIRone (BUSPAR) 5 MG tablet TAKE 1 TABLET BY MOUTH THREE TIMES A DAY (Patient taking differently: Take 5 mg by mouth 3 (three) times daily.) 270 tablet 1   dexamethasone (DECADRON) 1 MG tablet Take 1 tablet (1 mg total) by mouth daily with breakfast. 30 tablet 0   fluticasone (FLONASE) 50 MCG/ACT nasal spray SPRAY 2 SPRAYS INTO EACH NOSTRIL EVERY DAY 48 mL 0   Lacosamide 150 MG TABS Take 1 tablet (150 mg total) by mouth in the morning and at bedtime. 60 tablet 2   lomustine (GLEOSTINE) 10 MG capsule Take 2 capsules (20 mg total) by mouth once for 1 dose. Take on an empty stomach 1 hour before or 2 hours after meals. Caution:chemotherapy. 2 capsule 0   lomustine (GLEOSTINE) 100 MG capsule Take 1 capsule (100 mg total) by mouth once for 1 dose. Take on an empty stomach 1 hour before or  2 hours after meals. Caution:chemotherapy 1 capsule 0   lomustine (GLEOSTINE) 40 MG capsule Take 1 capsule (40 mg total) by mouth once for 1 dose. Take on an empty stomach 1 hour before or 2 hours after meals. Caution:chemotherapy. 1 capsule 0   LORazepam (ATIVAN) 2 MG tablet Take 1 tablet (2 mg total) by mouth every 8 (eight) hours as needed for seizure. 10 tablet 0   losartan (COZAAR) 50 MG tablet TAKE 1 TABLET BY MOUTH EVERY DAY (Patient taking differently: Take 50 mg by mouth daily.) 90 tablet 1   OLANZapine (ZYPREXA) 5 MG  tablet Take 1 tablet (5 mg total) by mouth at bedtime. Take during 5 nights of chemotherapy only 5 tablet 0   ondansetron (ZOFRAN) 8 MG tablet Take 1 tablet (8 mg total) by mouth every 8 (eight) hours as needed for nausea or vomiting. 30 tablet 1   SUMAtriptan (IMITREX) 50 MG tablet Take 50 mg by mouth every 2 (two) hours as needed for migraine. May repeat in 2 hours if headache persists or recurs.     topiramate (TOPAMAX) 50 MG tablet TAKE 1 TABLET BY MOUTH TWICE A DAY (Patient taking differently: Take 50 mg by mouth 2 (two) times daily.) 60 tablet 0   traZODone (DESYREL) 50 MG tablet Take 1 tablet (50 mg total) by mouth at bedtime as needed for sleep. 30 tablet 2   No current facility-administered medications on file prior to visit.    Allergies: No Known Allergies Past Medical History:  Past Medical History:  Diagnosis Date   Brain cancer (HCC)    Fibromyalgia    Hyperlipidemia    Hypertension    Migraine    Seizures (HCC)    Past Surgical History:  Past Surgical History:  Procedure Laterality Date   APPLICATION OF CRANIAL NAVIGATION N/A 04/11/2022   Procedure: APPLICATION OF CRANIAL NAVIGATION;  Surgeon: Coletta Memos, MD;  Location: [facility] OR;  Service: Neurosurgery;  Laterality: N/A;   APPLICATION OF CRANIAL NAVIGATION Left 05/06/2023   Procedure: APPLICATION OF CRANIAL NAVIGATION;  Surgeon: Coletta Memos, MD;  Location: [facility] OR;  Service: Neurosurgery;  Laterality: Left;   COLONOSCOPY     CRANIOTOMY Left 04/11/2022   Procedure: LEFT FRONTAL CRANIOTOMY FOR RESECTION OF TUMOR WITH [name];  Surgeon: Coletta Memos, MD;  Location: [facility] OR;  Service: Neurosurgery;  Laterality: Left;  RM 21   CRANIOTOMY Left 05/06/2023   Procedure: REDO LEFT CRANIOTOMY FOR TUMOR WITH STEALTH;  Surgeon: Coletta Memos, MD;  Location: [facility] OR;  Service: Neurosurgery;  Laterality: Left;   LAPAROSCOPIC APPENDECTOMY  07/15/2012   Procedure: APPENDECTOMY LAPAROSCOPIC;  Surgeon: Liz Malady, MD;  Location: [facility] OR;   Service: General;  Laterality: N/A;  Laparoscopic Appendectomy   Social History:  Social History   Socioeconomic History   Marital status: Widowed    Spouse name: Not on file   Number of children: 2   Years of education: Not on file   Highest education level: Not on file  Occupational History   Occupation: retail    Comment: [employer] in [place], [place]  Tobacco Use   Smoking status: Never   Smokeless tobacco: Never  Vaping Use   Vaping status: Never Used  Substance and Sexual Activity   Alcohol use: Not Currently    Comment: 2 drinks once every 2-3 months   Drug use: No   Sexual activity: Not Currently    Birth control/protection: Post-menopausal  Other Topics Concern   Not on file  Social History  Narrative   Marital status: widowed since 2008; husband was bipolar and alcoholic      Children: 2 children (28, 63); son lives with patient.Daughter lives in [place]/autism/does not drive.  Daughter lives with mother-in-law.  One grandpuppy      Lives: with son      Employment: works at [employer]; also works in Corporate investment banker business.      Tobacco: none      Alcohol: none; husband was an alcoholic.      Drugs:   none      Exercise:   Social Drivers of Corporate investment banker Strain: Low Risk  (02/26/2022)   Received from [facility]   Overall Financial Resource Strain (CARDIA)  Food Insecurity: No Food Insecurity (02/26/2022)   Received from [facility]   Hunger Vital Sign  Transportation Needs: No Transportation Needs (02/26/2022)   Received from [facility] - Transportation  Physical Activity: Insufficiently Active (02/26/2022)   Received from [facility]   Exercise Vital Sign  Stress: Stress Concern Present (02/26/2022)   Received from [facility]   [employer] of Occupational Health - Occupational Stress Questionnaire  Social Connections: Socially Isolated (02/26/2022)   Received from [facility]   Social Connection and Isolation  Panel [NHANES]  Intimate Partner Violence: Not At Risk (02/26/2022)   Received from [facility] visits prior to 10/25/2022., [facility] visits prior to 10/25/2022.   Humiliation, Afraid, Rape, and Kick questionnaire    Fear of Current or Ex-Partner: No    Emotionally Abused: No    Physically Abused: No    Sexually Abused: No   Family History:  Family History  Adopted: Yes    Review of Systems: Constitutional: Doesn't report fevers, chills or abnormal weight loss Eyes: Doesn't report blurriness of vision Ears, nose, mouth, throat, and face: Doesn't report sore throat Respiratory: Doesn't report cough, dyspnea or wheezes Cardiovascular: Doesn't report palpitation, chest discomfort  Gastrointestinal:  Doesn't report nausea, constipation, diarrhea GU: Doesn't report incontinence Skin: Doesn't report skin rashes Neurological: Per HPI Musculoskeletal: Doesn't report joint pain Behavioral/Psych: Doesn't report anxiety  Physical Exam: Vitals:   10/13/23 1004  BP: 101/75  Pulse: (!) 110  Resp: 18  Temp: (!) 97.5 F (36.4 C)  SpO2: 97%    KPS: 60 General: Alert, cooperative, pleasant, in no acute distress Head: Surgical sutures EENT: No conjunctival injection or scleral icterus.  Lungs: Resp effort normal Cardiac: Regular rate Abdomen: Non-distended abdomen Skin: No rashes cyanosis or petechiae. Extremities: No clubbing or edema  Neurologic Exam: Mental Status: Awake, alert, attentive to examiner. Oriented to self and environment. Language is densely impaired with regards to fluency, comprehension.  Pseudobulbar elements. Cranial Nerves: Visual acuity is grossly normal. Visual fields are full. Extra-ocular movements intact. No ptosis. Face is symmetric Motor: Tone and bulk are normal. Power is 4/5 in right arm and leg. Reflexes are symmetric, no pathologic reflexes present.  Sensory: Intact to light touch Gait: Hemiparetic, non  ambulatory   Labs: I have reviewed the data as listed    Component Value Date/Time   NA 143 10/05/2023 0917   NA 140 10/04/2018 1703   K 4.1 10/05/2023 0917   CL 109 10/05/2023 0917   CO2 26 10/05/2023 0917   GLUCOSE 152 (H) 10/05/2023 0917   BUN 26 (H) 10/05/2023 0917   BUN 24 10/04/2018 1703   CREATININE 1.18 (H) 10/05/2023 0917   CREATININE 1.06 (H) 01/29/2016 1518  CALCIUM 9.7 10/05/2023 0917   PROT 6.7 10/05/2023 0917   PROT 6.8 10/04/2018 1703   ALBUMIN 3.9 10/05/2023 0917   ALBUMIN 4.6 10/04/2018 1703   AST 17 10/05/2023 0917   ALT 26 10/05/2023 0917   ALKPHOS 64 10/05/2023 0917   BILITOT 0.8 10/05/2023 0917   GFRNONAA 51 (L) 10/05/2023 0917   GFRNONAA 58 (L) 01/29/2016 1518   GFRAA 70 10/04/2018 1703   GFRAA 67 01/29/2016 1518   Lab Results  Component Value Date   WBC 9.2 10/13/2023   NEUTROABS PENDING 10/13/2023   HGB 12.9 10/13/2023   HCT 37.7 10/13/2023   MCV 103.3 (H) 10/13/2023   PLT 119 (L) 10/13/2023    Assessment/Plan Glioblastoma multiforme of frontal lobe (HCC)  Seizure disorder (HCC)  Mont Dutton presents today for cycle #1, day 26/42 CCNU and avastin.  CCNU was dosed 09/18/23.  She has declined clinically, focally.  Daughter in-law understands this may be due to tumor progression.  Will look for guidance from upcoming MRI scan regarding further treatment plan decisions, goals of care.  Ok with continuing with Avastin today.   Patient will continue with cycle #1 oral CCNU 90mg /m2 q6 weeks and avastin 10mg /kg IV 2q weeks.  Avastin will be helpful as concurrent therapy given burden of enhancing tumor and steroid requirement.  We reviewed side effects of CCNU, including fatigue, nausea vomiting, cytopenias, ILD.  We reviewed side effects of avastin, including hypertension, bleeding/clotting events, wound healing impairment.  The patient will have a complete blood count, a comprehensive metabolic panel, and urine protein performed prior to each  avastin infusion. Labs may need to be performed more often. Zofran will prescribed for home use for nausea/vomiting.    Informed consent was obtained verbally at bedside to proceed with oral chemotherapy and avastin.  Chemotherapy should be held for the following:  ANC less than 1,000  Platelets less than 100,000  LFT or creatinine greater than 2x ULN  If clinical concerns/contraindications develop   Avastin should be held for the following:  ANC less than 500  Platelets less than 50,000  LFT or creatinine greater than 2x ULN  If clinical concerns/contraindications develop  Decadron may continue 1mg  daily if tolerated.  We also recommended she continue Vimpat 100mg  BID and Topiramate 50mg  BID for now.  We ask that Mont Dutton return to clinic in two weeks for cycle #2, day 1 avastin/CCNU, or sooner if needed.  All questions were answered. The patient knows to call the clinic with any problems, questions or concerns. No barriers to learning were detected.  The total time spent in the encounter was 40 minutes and more than 50% was on counseling and review of test results   Henreitta Leber, MD Medical Director of Neuro-Oncology [facility] at [facility] 10/13/23 9:49 AM

## 2023-10-13 NOTE — Patient Instructions (Signed)
[facility] - A DEPT OF [facility]  Discharge Instructions: Thank you for choosing [facility] to provide your oncology and hematology care.   If you have a lab appointment with the Cancer Center, please go directly to the Cancer Center and check in at the registration area.   Wear comfortable clothing and clothing appropriate for easy access to any Portacath or PICC line.   We strive to give you quality time with your provider. You may need to reschedule your appointment if you arrive late (15 or more minutes).  Arriving late affects you and other patients whose appointments are after yours.  Also, if you miss three or more appointments without notifying the office, you may be dismissed from the clinic at the provider's discretion.      For prescription refill requests, have your pharmacy contact our office and allow 72 hours for refills to be completed.    Today you received the following chemotherapy and/or immunotherapy agents: bevacizumab-awwb (MVASI)       To help prevent nausea and vomiting after your treatment, we encourage you to take your nausea medication as directed.  BELOW ARE SYMPTOMS THAT SHOULD BE REPORTED IMMEDIATELY: *FEVER GREATER THAN 100.4 F (38 C) OR HIGHER *CHILLS OR SWEATING *NAUSEA AND VOMITING THAT IS NOT CONTROLLED WITH YOUR NAUSEA MEDICATION *UNUSUAL SHORTNESS OF BREATH *UNUSUAL BRUISING OR BLEEDING *URINARY PROBLEMS (pain or burning when urinating, or frequent urination) *BOWEL PROBLEMS (unusual diarrhea, constipation, pain near the anus) TENDERNESS IN MOUTH AND THROAT WITH OR WITHOUT PRESENCE OF ULCERS (sore throat, sores in mouth, or a toothache) UNUSUAL RASH, SWELLING OR PAIN  UNUSUAL VAGINAL DISCHARGE OR ITCHING   Items with * indicate a potential emergency and should be followed up as soon as possible or go to the Emergency Department if any problems should occur.  Please show the CHEMOTHERAPY ALERT CARD  or IMMUNOTHERAPY ALERT CARD at check-in to the Emergency Department and triage nurse.  Should you have questions after your visit or need to cancel or reschedule your appointment, please contact [facility] - A DEPT OF [facility]  Dept: [phone]  and follow the prompts.  Office hours are 8:00 a.m. to 4:30 p.m. Monday - Friday. Please note that voicemails left after 4:00 p.m. may not be returned until the following business day.  We are closed weekends and major holidays. You have access to a nurse at all times for urgent questions. Please call the main number to the clinic Dept: [phone] and follow the prompts.   For any non-urgent questions, you may also contact your provider using MyChart. We now offer e-Visits for anyone 30 and older to request care online for non-urgent symptoms. For details visit [email or web address].   Also download the MyChart app! Go to the app store, search "MyChart", open the app, select [place], and log in with your MyChart username and password.

## 2023-10-14 ENCOUNTER — Other Ambulatory Visit: Payer: Self-pay

## 2023-10-15 ENCOUNTER — Encounter: Payer: Self-pay | Admitting: Internal Medicine

## 2023-10-16 ENCOUNTER — Other Ambulatory Visit: Payer: Self-pay

## 2023-10-19 ENCOUNTER — Inpatient Hospital Stay: Payer: Medicare Other

## 2023-10-20 ENCOUNTER — Encounter: Payer: Self-pay | Admitting: *Deleted

## 2023-10-20 ENCOUNTER — Encounter: Payer: Self-pay | Admitting: Internal Medicine

## 2023-10-20 ENCOUNTER — Other Ambulatory Visit: Payer: Self-pay | Admitting: Internal Medicine

## 2023-10-23 ENCOUNTER — Ambulatory Visit (HOSPITAL_COMMUNITY)
Admission: RE | Admit: 2023-10-23 | Discharge: 2023-10-23 | Disposition: A | Discharge status: Home / Self Care | Payer: Medicare Other | Source: Ambulatory Visit | Attending: Internal Medicine | Admitting: Internal Medicine

## 2023-10-23 ENCOUNTER — Other Ambulatory Visit: Payer: Medicare Other

## 2023-10-23 DIAGNOSIS — C711 Malignant neoplasm of frontal lobe: Secondary | ICD-10-CM | POA: Insufficient documentation

## 2023-10-23 MED ORDER — GADOBUTROL 1 MMOL/ML IV SOLN
7.0000 mL | Freq: Once | INTRAVENOUS | Status: AC | PRN
  Administered 2023-10-23: 7 mL via INTRAVENOUS

## 2023-10-27 ENCOUNTER — Inpatient Hospital Stay: Payer: Medicare Other | Attending: Internal Medicine

## 2023-10-27 ENCOUNTER — Inpatient Hospital Stay: Payer: Medicare Other

## 2023-10-27 ENCOUNTER — Other Ambulatory Visit: Payer: Self-pay | Admitting: Internal Medicine

## 2023-10-27 ENCOUNTER — Inpatient Hospital Stay: Payer: Medicare Other | Admitting: Internal Medicine

## 2023-10-27 VITALS — BP 132/78 | HR 108 | Temp 97.4°F | Resp 16

## 2023-10-27 DIAGNOSIS — Z7952 Long term (current) use of systemic steroids: Secondary | ICD-10-CM | POA: Diagnosis not present

## 2023-10-27 DIAGNOSIS — Z5112 Encounter for antineoplastic immunotherapy: Secondary | ICD-10-CM | POA: Insufficient documentation

## 2023-10-27 DIAGNOSIS — Z923 Personal history of irradiation: Secondary | ICD-10-CM | POA: Diagnosis not present

## 2023-10-27 DIAGNOSIS — G40909 Epilepsy, unspecified, not intractable, without status epilepticus: Secondary | ICD-10-CM | POA: Diagnosis not present

## 2023-10-27 DIAGNOSIS — C711 Malignant neoplasm of frontal lobe: Secondary | ICD-10-CM

## 2023-10-27 DIAGNOSIS — R2689 Other abnormalities of gait and mobility: Secondary | ICD-10-CM | POA: Insufficient documentation

## 2023-10-27 DIAGNOSIS — R531 Weakness: Secondary | ICD-10-CM | POA: Insufficient documentation

## 2023-10-27 DIAGNOSIS — R569 Unspecified convulsions: Secondary | ICD-10-CM

## 2023-10-27 DIAGNOSIS — Z79899 Other long term (current) drug therapy: Secondary | ICD-10-CM | POA: Diagnosis not present

## 2023-10-27 LAB — CBC WITH DIFFERENTIAL (CANCER CENTER ONLY)
Abs Immature Granulocytes: 0.07 10*3/uL (ref 0.00–0.07)
Basophils Absolute: 0 10*3/uL (ref 0.0–0.1)
Basophils Relative: 0 %
Eosinophils Absolute: 0 10*3/uL (ref 0.0–0.5)
Eosinophils Relative: 0 %
HCT: 34.9 % — ABNORMAL LOW (ref 36.0–46.0)
Hemoglobin: 11.7 g/dL — ABNORMAL LOW (ref 12.0–15.0)
Immature Granulocytes: 1 %
Lymphocytes Relative: 9 %
Lymphs Abs: 0.6 10*3/uL — ABNORMAL LOW (ref 0.7–4.0)
MCH: 34.7 pg — ABNORMAL HIGH (ref 26.0–34.0)
MCHC: 33.5 g/dL (ref 30.0–36.0)
MCV: 103.6 fL — ABNORMAL HIGH (ref 80.0–100.0)
Monocytes Absolute: 0.6 10*3/uL (ref 0.1–1.0)
Monocytes Relative: 8 %
Neutro Abs: 5.8 10*3/uL (ref 1.7–7.7)
Neutrophils Relative %: 82 %
Platelet Count: 236 10*3/uL (ref 150–400)
RBC: 3.37 MIL/uL — ABNORMAL LOW (ref 3.87–5.11)
RDW: 14.1 % (ref 11.5–15.5)
WBC Count: 7.2 10*3/uL (ref 4.0–10.5)
nRBC: 0 % (ref 0.0–0.2)

## 2023-10-27 LAB — CMP (CANCER CENTER ONLY)
ALT: 14 U/L (ref 0–44)
AST: 14 U/L — ABNORMAL LOW (ref 15–41)
Albumin: 3.7 g/dL (ref 3.5–5.0)
Alkaline Phosphatase: 68 U/L (ref 38–126)
Anion gap: 8 (ref 5–15)
BUN: 18 mg/dL (ref 8–23)
CO2: 22 mmol/L (ref 22–32)
Calcium: 9.2 mg/dL (ref 8.9–10.3)
Chloride: 109 mmol/L (ref 98–111)
Creatinine: 0.9 mg/dL (ref 0.44–1.00)
GFR, Estimated: >60 mL/min (ref >60)
Glucose, Bld: 121 mg/dL — ABNORMAL HIGH (ref 70–99)
Potassium: 4.2 mmol/L (ref 3.5–5.1)
Sodium: 139 mmol/L (ref 135–145)
Total Bilirubin: 0.6 mg/dL (ref 0.0–1.2)
Total Protein: 6.7 g/dL (ref 6.5–8.1)

## 2023-10-27 LAB — TOTAL PROTEIN, URINE DIPSTICK: Protein, ur: NEGATIVE mg/dL

## 2023-10-27 MED ORDER — SODIUM CHLORIDE 0.9 % IV SOLN: INTRAVENOUS | Status: DC

## 2023-10-27 MED ORDER — SODIUM CHLORIDE 0.9 % IV SOLN
10.0000 mg/kg | Freq: Once | INTRAVENOUS | Status: AC
  Administered 2023-10-27: 800 mg via INTRAVENOUS
  Filled 2023-10-27: qty 16

## 2023-10-27 NOTE — Progress Notes (Signed)
 [facility] at [facility] [address]  [place], [place] [address] [phone]   Interval Evaluation  Date of Service: 10/27/23 [name] Patient MRN: [id number] Patient DOB: 23-Jul-1956 Provider: Henreitta Leber, MD  Identifying Statement:  Jennifer [name] is a 68 y.o. female with left frontal glioblastoma   Oncologic History: Oncology History  Glioblastoma multiforme of frontal lobe (HCC)  04/11/2022 Surgery   Craniotomy, resection of left frontal mass with Dr. Franky Macho; path is glioblastoma IDHwt.   05/12/2022 - 06/23/2022 Radiation Therapy   IMRT and concurrent Temodar 75mg /m2   07/21/2022 - 03/31/2023 Chemotherapy   Completes 7 cycles of 5-day Temodar   04/23/2023 Progression   Progression of disease   05/06/2023 Surgery   Second craniotomy, resection with Dr. Franky Macho   09/17/2023 -  Chemotherapy   Patient is on Treatment Plan : BRAIN Bevacizumab D1,15,29 + Lomustine (90) D1 q42d       Biomarkers:  MGMT Unknown.  IDH 1/2 Wild type.  EGFR Unknown  TERT Unknown   Interval History: Jennifer [name] presents today for avastin infusion, following recent MRI brain.  Family is now really struggling to get her out of bed and to appointments due to progressive weakness with the right arm and leg.  Now using a wheelchair even in the home.  Still having confusion and imbalance; she "repeats words over and over".  Decadron remains at 1mg  daily. No additional seizures since prior visit.  Vimpat continues at 100mg  BID, continues on Topamax 50mg  BID as well.     H+P (05/05/22) Patient presented to neurologic attention in mid-August 2023 with new onset difficulty speaking, confusion noticed by family.  She and her family described sudden onset speech impairment and confusion in the afternoon of the 16th, after having been seen in normal state of health that morning.  CNS imaging demonstrated an enhancing left frontal mass, which was resected by  Dr. Franky Macho on 04/11/22.  Following surgery she had some speech difficulty, but improved quickly through rehab admission.  At present, she is fully independent with gait, speech, ADLs.  She is off decadron and Keppra.  Retired, had worked at [employer] in [employer].  Today presents with son and daughter in law.    Medications: Current Outpatient Medications on File Prior to Visit  Medication Sig Dispense Refill   amLODipine (NORVASC) 5 MG tablet TAKE 1 TABLET BY MOUTH EVERY DAY (Patient taking differently: Take 5 mg by mouth daily.) 90 tablet 1   atorvastatin (LIPITOR) 10 MG tablet TAKE 1 TABLET BY MOUTH EVERY DAY (Patient taking differently: Take 10 mg by mouth daily.) 90 tablet 1   busPIRone (BUSPAR) 5 MG tablet TAKE 1 TABLET BY MOUTH THREE TIMES A DAY (Patient taking differently: Take 5 mg by mouth 3 (three) times daily.) 270 tablet 1   dexamethasone (DECADRON) 1 MG tablet Take 1 tablet (1 mg total) by mouth daily with breakfast. 30 tablet 0   fluticasone (FLONASE) 50 MCG/ACT nasal spray SPRAY 2 SPRAYS INTO EACH NOSTRIL EVERY DAY 48 mL 0   Lacosamide 150 MG TABS Take 1 tablet (150 mg total) by mouth in the morning and at bedtime. 60 tablet 2   lomustine (GLEOSTINE) 10 MG capsule Take 2 capsules (20 mg total) by mouth once for 1 dose. Take on an empty stomach 1 hour before or 2 hours after meals. Caution:chemotherapy. 2 capsule 0   lomustine (GLEOSTINE) 100 MG capsule Take 1 capsule (100 mg total)  by mouth once for 1 dose. Take on an empty stomach 1 hour before or 2 hours after meals. Caution:chemotherapy 1 capsule 0   lomustine (GLEOSTINE) 40 MG capsule Take 1 capsule (40 mg total) by mouth once for 1 dose. Take on an empty stomach 1 hour before or 2 hours after meals. Caution:chemotherapy. 1 capsule 0   LORazepam (ATIVAN) 2 MG tablet Take 1 tablet (2 mg total) by mouth every 8 (eight) hours as needed for seizure. 10 tablet 0   losartan (COZAAR) 50 MG tablet TAKE 1 TABLET BY MOUTH EVERY DAY (Patient  taking differently: Take 50 mg by mouth daily.) 90 tablet 1   OLANZapine (ZYPREXA) 5 MG tablet Take 1 tablet (5 mg total) by mouth at bedtime. Take during 5 nights of chemotherapy only 5 tablet 0   ondansetron (ZOFRAN) 8 MG tablet Take 1 tablet (8 mg total) by mouth every 8 (eight) hours as needed for nausea or vomiting. 30 tablet 1   SUMAtriptan (IMITREX) 50 MG tablet Take 50 mg by mouth every 2 (two) hours as needed for migraine. May repeat in 2 hours if headache persists or recurs.     topiramate (TOPAMAX) 50 MG tablet TAKE 1 TABLET BY MOUTH TWICE A DAY (Patient taking differently: Take 50 mg by mouth 2 (two) times daily.) 60 tablet 0   traZODone (DESYREL) 50 MG tablet Take 1 tablet (50 mg total) by mouth at bedtime as needed for sleep. 30 tablet 2   No current facility-administered medications on file prior to visit.    Allergies: No Known Allergies Past Medical History:  Past Medical History:  Diagnosis Date   Brain cancer (HCC)    Fibromyalgia    Hyperlipidemia    Hypertension    Migraine    Seizures (HCC)    Past Surgical History:  Past Surgical History:  Procedure Laterality Date   APPLICATION OF CRANIAL NAVIGATION N/A 04/11/2022   Procedure: APPLICATION OF CRANIAL NAVIGATION;  Surgeon: Coletta Memos, MD;  Location: [facility] OR;  Service: Neurosurgery;  Laterality: N/A;   APPLICATION OF CRANIAL NAVIGATION Left 05/06/2023   Procedure: APPLICATION OF CRANIAL NAVIGATION;  Surgeon: Coletta Memos, MD;  Location: [facility] OR;  Service: Neurosurgery;  Laterality: Left;   COLONOSCOPY     CRANIOTOMY Left 04/11/2022   Procedure: LEFT FRONTAL CRANIOTOMY FOR RESECTION OF TUMOR WITH [name];  Surgeon: Coletta Memos, MD;  Location: [facility] OR;  Service: Neurosurgery;  Laterality: Left;  RM 21   CRANIOTOMY Left 05/06/2023   Procedure: REDO LEFT CRANIOTOMY FOR TUMOR WITH STEALTH;  Surgeon: Coletta Memos, MD;  Location: [facility] OR;  Service: Neurosurgery;  Laterality: Left;   LAPAROSCOPIC APPENDECTOMY  07/15/2012    Procedure: APPENDECTOMY LAPAROSCOPIC;  Surgeon: Liz Malady, MD;  Location: [facility] OR;  Service: General;  Laterality: N/A;  Laparoscopic Appendectomy   Social History:  Social History   Socioeconomic History   Marital status: Widowed    Spouse name: Not on file   Number of children: 2   Years of education: Not on file   Highest education level: Not on file  Occupational History   Occupation: retail    Comment: [employer] in [place], [place]  Tobacco Use   Smoking status: Never   Smokeless tobacco: Never  Vaping Use   Vaping status: Never Used  Substance and Sexual Activity   Alcohol use: Not Currently    Comment: 2 drinks once every 2-3 months   Drug use: No   Sexual activity: Not Currently  Birth control/protection: Post-menopausal  Other Topics Concern   Not on file  Social History Narrative   Marital status: widowed since 2008; husband was bipolar and alcoholic      Children: 2 children (28, 23); son lives with patient.Daughter lives in [place]/autism/does not drive.  Daughter lives with mother-in-law.  One grandpuppy      Lives: with son      Employment: works at [employer]; also works in Corporate investment banker business.      Tobacco: none      Alcohol: none; husband was an alcoholic.      Drugs:   none      Exercise:   Social Drivers of Corporate investment banker Strain: Low Risk  (02/26/2022)   Received from [facility]   Overall Financial Resource Strain (CARDIA)  Food Insecurity: No Food Insecurity (02/26/2022)   Received from [facility]   Hunger Vital Sign  Transportation Needs: No Transportation Needs (02/26/2022)   Received from [facility] - Transportation  Physical Activity: Insufficiently Active (02/26/2022)   Received from [facility]   Exercise Vital Sign  Stress: Stress Concern Present (02/26/2022)   Received from [facility]   [employer] of Occupational Health - Occupational Stress Questionnaire  Social Connections: Socially  Isolated (02/26/2022)   Received from [facility]   Social Connection and Isolation Panel [NHANES]  Intimate Partner Violence: Not At Risk (02/26/2022)   Received from [facility] visits prior to 10/25/2022., [facility] visits prior to 10/25/2022.   Humiliation, Afraid, Rape, and Kick questionnaire    Fear of Current or Ex-Partner: No    Emotionally Abused: No    Physically Abused: No    Sexually Abused: No   Family History:  Family History  Adopted: Yes    Review of Systems: Constitutional: Doesn't report fevers, chills or abnormal weight loss Eyes: Doesn't report blurriness of vision Ears, nose, mouth, throat, and face: Doesn't report sore throat Respiratory: Doesn't report cough, dyspnea or wheezes Cardiovascular: Doesn't report palpitation, chest discomfort  Gastrointestinal:  Doesn't report nausea, constipation, diarrhea GU: Doesn't report incontinence Skin: Doesn't report skin rashes Neurological: Per HPI Musculoskeletal: Doesn't report joint pain Behavioral/Psych: Doesn't report anxiety  Physical Exam: Vitals:   10/27/23 1450  BP: 132/78  Pulse: (!) 108  Resp: 16  Temp: (!) 97.4 F (36.3 C)  SpO2: 98%    KPS: 60 General: Alert, cooperative, pleasant, in no acute distress Head: Surgical sutures EENT: No conjunctival injection or scleral icterus.  Lungs: Resp effort normal Cardiac: Regular rate Abdomen: Non-distended abdomen Skin: No rashes cyanosis or petechiae. Extremities: No clubbing or edema  Neurologic Exam: Mental Status: Awake, alert, attentive to examiner. Oriented to self and environment. Language is densely impaired with regards to fluency, comprehension.  Pseudobulbar elements. Cranial Nerves: Visual acuity is grossly normal. Visual fields are full. Extra-ocular movements intact. No ptosis. Face is symmetric Motor: Tone and bulk are normal. Power is 4/5 in right arm and leg. Reflexes are symmetric, no  pathologic reflexes present.  Sensory: Intact to light touch Gait: Hemiparetic, non ambulatory   Labs: I have reviewed the data as listed    Component Value Date/Time   NA 145 10/13/2023 0934   NA 140 10/04/2018 1703   K 4.2 10/13/2023 0934   CL 115 (H) 10/13/2023 0934   CO2 22 10/13/2023 0934   GLUCOSE 144 (H) 10/13/2023 0934   BUN 22 10/13/2023 0934   BUN 24 10/04/2018  1703   CREATININE 1.03 (H) 10/13/2023 0934   CREATININE 1.06 (H) 01/29/2016 1518   CALCIUM 9.3 10/13/2023 0934   PROT 6.5 10/13/2023 0934   PROT 6.8 10/04/2018 1703   ALBUMIN 3.8 10/13/2023 0934   ALBUMIN 4.6 10/04/2018 1703   AST 15 10/13/2023 0934   ALT 22 10/13/2023 0934   ALKPHOS 71 10/13/2023 0934   BILITOT 0.7 10/13/2023 0934   GFRNONAA 60 (L) 10/13/2023 0934   GFRNONAA 58 (L) 01/29/2016 1518   GFRAA 70 10/04/2018 1703   GFRAA 67 01/29/2016 1518   Lab Results  Component Value Date   WBC 7.2 10/27/2023   NEUTROABS 5.8 10/27/2023   HGB 11.7 (L) 10/27/2023   HCT 34.9 (L) 10/27/2023   MCV 103.6 (H) 10/27/2023   PLT 236 10/27/2023   Imaging:  [facility] Clinician Interpretation: I have personally reviewed the CNS images as listed.  My interpretation, in the context of the patient's clinical presentation, is progressive disease  MR BRAIN W WO CONTRAST Result Date: 10/23/2023 CLINICAL DATA:  Seen as neoplasm, assess treatment response. EXAM: MRI HEAD WITHOUT AND WITH CONTRAST TECHNIQUE: Multiplanar, multiecho pulse sequences of the brain and surrounding structures were obtained without and with intravenous contrast. CONTRAST:  7mL GADAVIST GADOBUTROL 1 MMOL/ML IV SOLN COMPARISON:  08/31/2023 FINDINGS: Brain: Continued progressive findings around the left frontal resection site, including increasing areas of restricted diffusion and progressive enhancement which is now circumferential, greatest change around the inferior cavity. Infiltrative T2 isointense thickening at the upper, medial, and posterior aspects  of the resection cavity appear progressed, especially on coronal T2 weighted imaging, with further swelling causing greater deformity on the frontal horn of the left lateral ventricle. Th enhancement in the right frontal white matter is no longer confidently seen, which may be related to the Avastin history or an otherwise resolved ischemic/inflammatory focus. Chronic blood products. No evidence of acute infarct, acute hemorrhage, hydrocephalus, or collection. Vascular: Major flow voids and vascular enhancements are preserved Skull and upper cervical spine: Normal marrow signal. Sinuses/Orbits: Negative IMPRESSION: Progressive disease with further swelling, enhancement, and restricted diffusion around the left frontal resection site Electronically Signed   By: Tiburcio Pea M.D.   On: 10/23/2023 12:05   Assessment/Plan Glioblastoma multiforme of frontal lobe (HCC)  Focal seizures (HCC)  Jennifer [name] is clinically progressive today, now having completed cycle #1 of CCNU and avastin. MRI brain confirms progression of disease.   We discussed goals of care in detail at bedside with patient, son and daughter-in-law.  They understand her limited prognosis based on poor functional status, limited response to systemic therapy.  They will give consideration to hospice, but we also discussed possible trial of Irinotecan and Avastin.  Communicated potential toxicity, including diarrhea.     For today, avastin infusion can proceed as planned due to modest clinical benefit following last cycle.  Avastin should be held for the following:  ANC less than 500  Platelets less than 50,000  LFT or creatinine greater than 2x ULN  If clinical concerns/contraindications develop  Decadron may continue 1mg  daily if tolerated.  We also recommended she continue Vimpat 100mg  BID and Topiramate 50mg  BID for now.  We ask that Mont Dutton discuss goals of care with family and let us know about their preferred  path forward.  All questions were answered. The patient knows to call the clinic with any problems, questions or concerns. No barriers to learning were detected.  The total time spent in the encounter was  40 minutes and more than 50% was on counseling and review of test results   Henreitta Leber, MD Medical Director of [facility] [place] 10/27/23 2:56 PM

## 2023-10-27 NOTE — Patient Instructions (Signed)
 [facility] - A DEPT OF [facility]  Discharge Instructions: Thank you for choosing [facility] to provide your oncology and hematology care.   If you have a lab appointment with the Cancer Center, please go directly to the Cancer Center and check in at the registration area.   Wear comfortable clothing and clothing appropriate for easy access to any Portacath or PICC line.   We strive to give you quality time with your provider. You may need to reschedule your appointment if you arrive late (15 or more minutes).  Arriving late affects you and other patients whose appointments are after yours.  Also, if you miss three or more appointments without notifying the office, you may be dismissed from the clinic at the provider's discretion.      For prescription refill requests, have your pharmacy contact our office and allow 72 hours for refills to be completed.    Today you received the following chemotherapy and/or immunotherapy agents: Bevacizumab      To help prevent nausea and vomiting after your treatment, we encourage you to take your nausea medication as directed.  BELOW ARE SYMPTOMS THAT SHOULD BE REPORTED IMMEDIATELY: *FEVER GREATER THAN 100.4 F (38 C) OR HIGHER *CHILLS OR SWEATING *NAUSEA AND VOMITING THAT IS NOT CONTROLLED WITH YOUR NAUSEA MEDICATION *UNUSUAL SHORTNESS OF BREATH *UNUSUAL BRUISING OR BLEEDING *URINARY PROBLEMS (pain or burning when urinating, or frequent urination) *BOWEL PROBLEMS (unusual diarrhea, constipation, pain near the anus) TENDERNESS IN MOUTH AND THROAT WITH OR WITHOUT PRESENCE OF ULCERS (sore throat, sores in mouth, or a toothache) UNUSUAL RASH, SWELLING OR PAIN  UNUSUAL VAGINAL DISCHARGE OR ITCHING   Items with * indicate a potential emergency and should be followed up as soon as possible or go to the Emergency Department if any problems should occur.  Please show the CHEMOTHERAPY ALERT CARD or  IMMUNOTHERAPY ALERT CARD at check-in to the Emergency Department and triage nurse.  Should you have questions after your visit or need to cancel or reschedule your appointment, please contact [facility] - A DEPT OF [facility]  Dept: [phone]  and follow the prompts.  Office hours are 8:00 a.m. to 4:30 p.m. Monday - Friday. Please note that voicemails left after 4:00 p.m. may not be returned until the following business day.  We are closed weekends and major holidays. You have access to a nurse at all times for urgent questions. Please call the main number to the clinic Dept: [phone] and follow the prompts.   For any non-urgent questions, you may also contact your provider using MyChart. We now offer e-Visits for anyone 67 and older to request care online for non-urgent symptoms. For details visit [email or web address].   Also download the MyChart app! Go to the app store, search "MyChart", open the app, select [place], and log in with your MyChart username and password.

## 2023-10-28 ENCOUNTER — Encounter: Payer: Self-pay | Admitting: Internal Medicine

## 2023-10-30 ENCOUNTER — Telehealth: Payer: Self-pay

## 2023-10-30 ENCOUNTER — Other Ambulatory Visit: Payer: Self-pay

## 2023-10-30 DIAGNOSIS — R569 Unspecified convulsions: Secondary | ICD-10-CM

## 2023-10-30 DIAGNOSIS — C711 Malignant neoplasm of frontal lobe: Secondary | ICD-10-CM

## 2023-10-30 DIAGNOSIS — G40909 Epilepsy, unspecified, not intractable, without status epilepticus: Secondary | ICD-10-CM

## 2023-10-30 NOTE — Telephone Encounter (Signed)
 Pt's daughter-in-law called and stated that Dr. Barbaraann Cao discussed with them (pt, son, and daughter-in-law) hospice the last time they were in clinic.  Pt's daughter-in-law stated they have decided to go onto hospice.  Provided pt's daughter-in-law with the local hospice provider and she decided to go with [facility].  Stated that someone from [facility] would be contacting them to schedule an admission visit with the pt at which time they will discuss what is hospice and the services they provide.  Pt's daughter-in-law verbalized understanding and requesting if [facility] would please contact her or the pt's son to schedule the admission appt.  Pt's daughter-in-law had no further questions or concerns at this time.  Notified Dr. Barbaraann Cao of the pt and pt's family's decision.

## 2023-11-03 ENCOUNTER — Other Ambulatory Visit: Payer: Medicare Other

## 2023-11-10 ENCOUNTER — Ambulatory Visit: Payer: Medicare Other | Admitting: Internal Medicine

## 2023-11-10 ENCOUNTER — Ambulatory Visit: Payer: Medicare Other

## 2023-11-10 ENCOUNTER — Telehealth: Payer: Self-pay | Admitting: *Deleted

## 2023-11-10 ENCOUNTER — Other Ambulatory Visit: Payer: Medicare Other

## 2023-11-10 NOTE — Telephone Encounter (Signed)
 Patient's son Gabriel Rung called & left VM - stated his mom is now on hospice & asked that we cancel all her appointments.  Appointments canceled.

## 2023-11-17 ENCOUNTER — Other Ambulatory Visit: Payer: Medicare Other

## 2023-11-24 ENCOUNTER — Other Ambulatory Visit: Payer: Medicare Other

## 2023-11-24 ENCOUNTER — Ambulatory Visit: Payer: Medicare Other

## 2023-11-24 ENCOUNTER — Ambulatory Visit: Payer: Medicare Other | Admitting: Internal Medicine

## 2023-11-25 ENCOUNTER — Emergency Department (HOSPITAL_COMMUNITY)
Admission: EM | Admit: 2023-11-25 | Discharge: 2023-11-26 | Disposition: A | Discharge status: Hospice | Attending: Emergency Medicine | Admitting: Emergency Medicine

## 2023-11-25 ENCOUNTER — Other Ambulatory Visit: Payer: Self-pay

## 2023-11-25 ENCOUNTER — Encounter (HOSPITAL_COMMUNITY): Payer: Self-pay

## 2023-11-25 DIAGNOSIS — G40901 Epilepsy, unspecified, not intractable, with status epilepticus: Secondary | ICD-10-CM | POA: Diagnosis not present

## 2023-11-25 DIAGNOSIS — R000 Tachycardia, unspecified: Secondary | ICD-10-CM | POA: Insufficient documentation

## 2023-11-25 DIAGNOSIS — C719 Malignant neoplasm of brain, unspecified: Secondary | ICD-10-CM | POA: Insufficient documentation

## 2023-11-25 DIAGNOSIS — Z515 Encounter for palliative care: Secondary | ICD-10-CM | POA: Insufficient documentation

## 2023-11-25 DIAGNOSIS — R569 Unspecified convulsions: Secondary | ICD-10-CM | POA: Diagnosis present

## 2023-11-25 LAB — CBG MONITORING, ED: Glucose-Capillary: 250 mg/dL — ABNORMAL HIGH (ref 70–99)

## 2023-11-25 LAB — CBC
HCT: 40 % (ref 36.0–46.0)
Hemoglobin: 12.9 g/dL (ref 12.0–15.0)
MCH: 34 pg (ref 26.0–34.0)
MCHC: 32.3 g/dL (ref 30.0–36.0)
MCV: 105.5 fL — ABNORMAL HIGH (ref 80.0–100.0)
Platelets: 292 10*3/uL (ref 150–400)
RBC: 3.79 MIL/uL — ABNORMAL LOW (ref 3.87–5.11)
RDW: 13.5 % (ref 11.5–15.5)
WBC: 12.1 10*3/uL — ABNORMAL HIGH (ref 4.0–10.5)
nRBC: 0 % (ref 0.0–0.2)

## 2023-11-25 LAB — BASIC METABOLIC PANEL WITH GFR
Anion gap: 17 — ABNORMAL HIGH (ref 5–15)
BUN: 18 mg/dL (ref 8–23)
CO2: 13 mmol/L — ABNORMAL LOW (ref 22–32)
Calcium: 8.9 mg/dL (ref 8.9–10.3)
Chloride: 106 mmol/L (ref 98–111)
Creatinine, Ser: 1.59 mg/dL — ABNORMAL HIGH (ref 0.44–1.00)
GFR, Estimated: 36 mL/min — ABNORMAL LOW (ref >60)
Glucose, Bld: 259 mg/dL — ABNORMAL HIGH (ref 70–99)
Potassium: 3.9 mmol/L (ref 3.5–5.1)
Sodium: 136 mmol/L (ref 135–145)

## 2023-11-25 MED ORDER — GLYCOPYRROLATE 0.2 MG/ML IJ SOLN: 0.2000 mg | INTRAMUSCULAR | Status: DC | PRN

## 2023-11-25 MED ORDER — SODIUM CHLORIDE 0.9% FLUSH: 3.0000 mL | INTRAVENOUS | Status: DC | PRN

## 2023-11-25 MED ORDER — ONDANSETRON 4 MG PO TBDP: 4.0000 mg | ORAL_TABLET | Freq: Four times a day (QID) | ORAL | Status: DC | PRN

## 2023-11-25 MED ORDER — MORPHINE 100MG IN NS 100ML (1MG/ML) PREMIX INFUSION
0.0000 mg/h | INTRAVENOUS | Status: DC
  Administered 2023-11-25: 5 mg/h via INTRAVENOUS
  Filled 2023-11-25: qty 100

## 2023-11-25 MED ORDER — MORPHINE BOLUS VIA INFUSION: 5.0000 mg | INTRAVENOUS | Status: DC | PRN

## 2023-11-25 MED ORDER — ACETAMINOPHEN 650 MG RE SUPP
650.0000 mg | Freq: Four times a day (QID) | RECTAL | Status: DC | PRN
  Administered 2023-11-25: 650 mg via RECTAL
  Filled 2023-11-25: qty 1

## 2023-11-25 MED ORDER — SODIUM CHLORIDE 0.9% FLUSH
3.0000 mL | Freq: Two times a day (BID) | INTRAVENOUS | Status: DC
  Administered 2023-11-25 – 2023-11-26 (×2): 10 mL via INTRAVENOUS

## 2023-11-25 MED ORDER — ONDANSETRON HCL 4 MG/2ML IJ SOLN: 4.0000 mg | Freq: Four times a day (QID) | INTRAMUSCULAR | Status: DC | PRN

## 2023-11-25 MED ORDER — ACETAMINOPHEN 325 MG PO TABS: 650.0000 mg | ORAL_TABLET | Freq: Four times a day (QID) | ORAL | Status: DC | PRN

## 2023-11-25 MED ORDER — POLYVINYL ALCOHOL 1.4 % OP SOLN: 1.0000 [drp] | Freq: Four times a day (QID) | OPHTHALMIC | Status: DC | PRN

## 2023-11-25 MED ORDER — LORAZEPAM 2 MG/ML IJ SOLN: 2.0000 mg | INTRAMUSCULAR | Status: DC | PRN

## 2023-11-25 MED ORDER — ZIPRASIDONE MESYLATE 20 MG IM SOLR: 10.0000 mg | Freq: Two times a day (BID) | INTRAMUSCULAR | Status: DC | PRN

## 2023-11-25 MED ORDER — GLYCOPYRROLATE 1 MG PO TABS: 1.0000 mg | ORAL_TABLET | ORAL | Status: DC | PRN

## 2023-11-25 NOTE — Care Management (Addendum)
 Transition of Care [facility]) - Emergency Department Mini Assessment   Patient Details  Name: Jennifer [name] MRN: [id number] Date of Birth: 12-21-1955  [facility] CM/SW Contact:    Lavenia Atlas, RN Phone Number: 11/25/2023, 10:33 PM   Clinical Narrative: Per chart review patient currently in [facility] ED for tonic clonic seizures. PMH: glioblastoma multiforme left frontal lobe, post cranitomy. Prior to admission patient is a hospice patient with [facility] for home hospice. Received TOC consult for residential hospice services.  This RNCM spoke with RN,Bonnie w/[facility] who reports residential hospice bed will be available tomorrow am. Kendal Hymen has discussed with EDP that patient will remain in [facility] ED until residential hospice bed available.   TOC will continue to follow for discharge needs.  ED Mini Assessment: What brought you to the Emergency Department? : Patient from home for a witnessed tonic clonic seizure that lasted 1 min. 3 additional seizures witnessed by EMS  Barriers to Discharge: Continued Medical Work up  [employer] interventions: hospice  Means of departure: Ambulance  Interventions which prevented an admission or readmission: Hospice    Patient Contact and Communications        ,                 Admission diagnosis:  seizure Patient Active Problem List   Diagnosis Date Noted   Status post craniotomy 05/06/2023   Brain tumor, glioma (HCC) 05/06/2023   Acute right hemiparesis (HCC) 04/23/2023   Left-sided weakness 04/23/2023   Seizure disorder (HCC) 04/23/2023   Migraine 04/23/2023   Focal seizures (HCC) 03/31/2023   Glioblastoma multiforme of frontal lobe (HCC) 04/09/2022   Essential hypertension 06/13/2015   Bilateral chronic knee pain 10/14/2013   Chronic bilateral low back pain without sciatica 11/01/2012   S/P laparoscopic appendectomy 07/28/2012   Migraines 09/24/2011   Hyperlipidemia 09/24/2011   PCP:  Judd Lien, PA-C Pharmacy:    [facility] [address] [place] - [address] [place] [address] Phone: [phone] Fax: [phone]  [facility] [address] [place] [address] Phone: [phone] Fax: [phone]  [facility] [place], [place] - [address] [place] [address] Phone: [phone] Fax: [phone]

## 2023-11-25 NOTE — ED Notes (Signed)
 250 WAS HER SUGAR

## 2023-11-25 NOTE — ED Triage Notes (Signed)
 BIB EMS from home for a witnessed tonic clonic seizure that lasted 1 min. 3 additional seizures witnessed by EMS. 5mg  of IM midazolam given with EMS. Hx of brain tumor and seizures. Stopped all treatment, and is now hospice. 100% on 15L NRB. Nonverbal and normally able to track and follow commands. BGL 250 during triage.

## 2023-11-25 NOTE — ED Provider Notes (Signed)
 [place] EMERGENCY DEPARTMENT AT [facility] Provider Note   CSN: [id number] Arrival date & time: 11/25/23  2038     History  Chief Complaint  Patient presents with   Seizures    Jennifer [name] is a 68 y.o. female.  HPI     68 year old female with history of glioblastoma comes into the emergency room with chief complaint of seizure.  Patient accompanied by her son and daughter-in-law.  Patient has known history of glioblastoma and seizures.  According to the family, patient had her last seizure in December.  In the last 2 days, she has had significant decline.  They have noticed that patient is not moving the right side as well.  Her alertness and activity level has gone down as well.  Patient is currently enrolled in hospice.  Because of her decline, the family had called hospice nurse this morning.  When the hospice team arrived, they indicated to her that patient might have suffered from a stroke or could have had a seizure.  And they started exploring options where patient can get better quality than the she was getting at home, given her profound changes in the last few days.  This evening however patient had an episode of seizure.  Patient was vomiting.  The episode lasted just for a minute.  EMS was called.  Per EMS, they witnessed 3 more episodes of seizure.  Patient received 5 mg of IM midazolam before she was brought to the ER and started on nonrebreather.  Per family, patient has become nonverbal over the last few days, but her mentation has worsened further over the past 2 days.  The weakness has worsened over the right side.  Family is comfortable with proceeding to comfort care only.  Do not feel they can help her at home at this time.  Home Medications Prior to Admission medications   Medication Sig Start Date End Date Taking? Authorizing Provider  amLODipine (NORVASC) 5 MG tablet TAKE 1 TABLET BY MOUTH EVERY DAY Patient taking differently: Take 5 mg by  mouth daily. 06/06/19   Shade Flood, MD  atorvastatin (LIPITOR) 10 MG tablet TAKE 1 TABLET BY MOUTH EVERY DAY Patient taking differently: Take 10 mg by mouth daily. 11/10/19   Shade Flood, MD  busPIRone (BUSPAR) 5 MG tablet TAKE 1 TABLET BY MOUTH THREE TIMES A DAY Patient taking differently: Take 5 mg by mouth 3 (three) times daily. 08/16/19   Shade Flood, MD  dexamethasone (DECADRON) 1 MG tablet Take 1 tablet by mouth once daily with breakfast 10/28/23   Henreitta Leber, MD  fluticasone [facility]) 50 MCG/ACT nasal spray SPRAY 2 SPRAYS INTO EACH NOSTRIL EVERY DAY 09/21/19   Shade Flood, MD  Lacosamide 150 MG TABS Take 1 tablet (150 mg total) by mouth in the morning and at bedtime. 08/24/23   Vaslow, Georgeanna Lea, MD  lomustine (GLEOSTINE) 10 MG capsule Take 2 capsules (20 mg total) by mouth once for 1 dose. Take on an empty stomach 1 hour before or 2 hours after meals. Caution:chemotherapy. 09/10/23 09/15/24  Henreitta Leber, MD  lomustine (GLEOSTINE) 100 MG capsule Take 1 capsule (100 mg total) by mouth once for 1 dose. Take on an empty stomach 1 hour before or 2 hours after meals. Caution:chemotherapy 09/10/23 09/15/24  Henreitta Leber, MD  lomustine (GLEOSTINE) 40 MG capsule Take 1 capsule (40 mg total) by mouth once for 1 dose. Take on an empty stomach 1 hour  before or 2 hours after meals. Caution:chemotherapy. 09/10/23 09/15/24  Henreitta Leber, MD  LORazepam (ATIVAN) 2 MG tablet Take 1 tablet (2 mg total) by mouth every 8 (eight) hours as needed for seizure. 06/08/23   Vaslow, Georgeanna Lea, MD  losartan (COZAAR) 50 MG tablet TAKE 1 TABLET BY MOUTH EVERY DAY Patient taking differently: Take 50 mg by mouth daily. 04/28/19   Shade Flood, MD  OLANZapine (ZYPREXA) 5 MG tablet Take 1 tablet (5 mg total) by mouth at bedtime. Take during 5 nights of chemotherapy only 09/16/23   Vaslow, Georgeanna Lea, MD  ondansetron (ZOFRAN) 8 MG tablet Take 1 tablet (8 mg total) by mouth every 8 (eight)  hours as needed for nausea or vomiting. 09/10/23   Vaslow, Georgeanna Lea, MD  SUMAtriptan (IMITREX) 50 MG tablet Take 50 mg by mouth every 2 (two) hours as needed for migraine. May repeat in 2 hours if headache persists or recurs.    [provider]  topiramate (TOPAMAX) 50 MG tablet TAKE 1 TABLET BY MOUTH TWICE A DAY Patient taking differently: Take 50 mg by mouth 2 (two) times daily. 12/26/19   Shade Flood, MD  traZODone (DESYREL) 50 MG tablet Take 1 tablet (50 mg total) by mouth at bedtime as needed for sleep. 05/05/22   Henreitta Leber, MD      Allergies    Patient has no known allergies.    Review of Systems   Review of Systems  All other systems reviewed and are negative.   Physical Exam Updated Vital Signs BP 103/70   Pulse (!) 113   Temp (!) 101.6 F (38.7 C) (Oral)   Resp 18   SpO2 97%  Physical Exam Vitals and nursing note reviewed.  Constitutional:      Appearance: She is well-developed. She is ill-appearing.  HENT:     Head: Atraumatic.  Eyes:     Comments: Pupils are 5 mm and equal  Cardiovascular:     Rate and Rhythm: Tachycardia present.  Pulmonary:     Effort: Pulmonary effort is normal.     Comments: Mild tachypnea Abdominal:     Palpations: Abdomen is soft.  Musculoskeletal:     Cervical back: Neck supple.  Skin:    General: Skin is warm.  Neurological:     Mental Status: She is disoriented.     Comments: Right-sided facial droop, right upper and lower extremity weakness noted Patient nonverbal     ED Results / Procedures / Treatments   Labs (all labs ordered are listed, but only abnormal results are displayed) Labs Reviewed  BASIC METABOLIC PANEL WITH GFR - Abnormal; Notable for the following components:      Result Value   CO2 13 (*)    Glucose, Bld 259 (*)    Creatinine, Ser 1.59 (*)    GFR, Estimated 36 (*)    Anion gap 17 (*)    All other components within normal limits  CBC - Abnormal; Notable for the following components:    WBC 12.1 (*)    RBC 3.79 (*)    MCV 105.5 (*)    All other components within normal limits  CBG MONITORING, ED - Abnormal; Notable for the following components:   Glucose-Capillary 250 (*)    All other components within normal limits    EKG EKG Interpretation Date/Time:  Wednesday November 25 2023 20:46:39 EDT Ventricular Rate:  122 PR Interval:  126 QRS Duration:  83 QT Interval:  336  QTC Calculation: 479 R Axis:   -22  Text Interpretation: Sinus tachycardia Ventricular premature complex Aberrant complex Borderline left axis deviation Low voltage, precordial leads Abnormal R-wave progression, early transition Consider anterior infarct Confirmed by Derwood Kaplan [id number]) on 11/25/2023 10:46:25 PM  Radiology No results found.  Procedures .Critical Care  Performed by: Derwood Kaplan, MD Authorized by: Derwood Kaplan, MD   Critical care provider statement:    Critical care time (minutes):  42   Critical care was necessary to treat or prevent imminent or life-threatening deterioration of the following conditions:  CNS failure or compromise   Critical care was time spent personally by me on the following activities:  Development of treatment plan with patient or surrogate, discussions with consultants, evaluation of patient's response to treatment, examination of patient, ordering and review of laboratory studies, ordering and performing treatments and interventions, pulse oximetry, re-evaluation of patient's condition, review of old charts and obtaining history from patient or surrogate     Medications Ordered in ED Medications  acetaminophen (TYLENOL) tablet 650 mg (has no administration in time range)    Or  acetaminophen (TYLENOL) suppository 650 mg (has no administration in time range)  glycopyrrolate (ROBINUL) tablet 1 mg (has no administration in time range)    Or  glycopyrrolate (ROBINUL) injection 0.2 mg (has no administration in time range)    Or  glycopyrrolate  (ROBINUL) injection 0.2 mg (has no administration in time range)  polyvinyl alcohol (LIQUIFILM TEARS) 1.4 % ophthalmic solution 1 drop (has no administration in time range)  sodium chloride flush (NS) 0.9 % injection 3-10 mL (has no administration in time range)  sodium chloride flush (NS) 0.9 % injection 3-10 mL (has no administration in time range)  ondansetron (ZOFRAN-ODT) disintegrating tablet 4 mg (has no administration in time range)    Or  ondansetron (ZOFRAN) injection 4 mg (has no administration in time range)  LORazepam (ATIVAN) injection 2-4 mg (has no administration in time range)  morphine bolus via infusion 5 mg (has no administration in time range)  morphine 100mg  in NS (1mg /mL) infusion - premix (has no administration in time range)  ziprasidone (GEODON) injection 10 mg (has no administration in time range)    ED Course/ Medical Decision Making/ A&P                                 Medical Decision Making Amount and/or Complexity of Data Reviewed Labs: ordered.  Risk OTC drugs. Prescription drug management.   68 year old female with history of glioblastoma comes in with chief complaint of seizures.  Collateral history provided by EMS, patient's son and daughter-in-law who are at the bedside.  I have also reviewed patient's records including near oncology note from Dr. Barbaraann Cao from earlier this year.  It appears that patient has previous history of seizures and she is on medications for it along with dexamethasone..  Patient also has been enrolled by hospice and was getting hospice at home.  It seems like in the last 2 days, her symptoms have further worsened.  She was brought to the ER after multiple episodes of seizure consistent with status epilepticus.  Patient noted to be febrile.  On exam, patient has right-sided weakness.  Appears that there is some new worsening of her symptoms along with breakthrough seizures which are also new for her.  After reviewing  patient's records, differential diagnosis for this patient includes brain bleed, status epilepticus, worsening  cancer, severe electrolyte abnormality, infection.  Patient has fever, tachycardia.  I spoke with son and the daughter-in-law.  I discussed with them their understanding of patient's condition and try to understand where patient is in her palliative journey.  I also inquired if family wanted to move towards comfort care.  After long discussion, family indicated that they are comfortable moving with comfort care only.  In the last 2 days, they have had very difficult time helping patient.  Current hospice help includes nurse visit 2 times a day.  Patient is requiring 24/7 assistance at this time.  She cannot move on her own.  They are comfortable moving with comfort care only.  Patient already had blood work sent before my assessment.  I asked him if they felt there was any utility in getting CT scan of the brain, given that we would probably not react to it.  Family is comfortable not getting CT scan of the head.  I spoke with the [facility].  They called me back and indicated that there is no bed available at [facility] tonight, but there might be one available in the morning.  They are requesting that patient be kept in the emergency room.  Someone will follow-up in the morning and patient should have a better plan by between 8 and 10 AM.  I discussed this recommendation with the family.  They are comfortable with the plan.  Hospice team will also contact family in the morning.  At this time, I have ordered Tylenol for fever.  I have also initiated comfort care measures.  Family aware that patient could have breakthrough seizures, and that we will give her as needed Ativan if that were to occur.   Final Clinical Impression(s) / ED Diagnoses Final diagnoses:  Glioblastoma (HCC)  Encounter for palliative care in home hospice  Status epilepticus [facility])    Rx / DC Orders ED Discharge  Orders     None         Derwood Kaplan, MD 11/25/23 2302

## 2023-11-26 NOTE — Progress Notes (Signed)
 Jennifer [name] ED [facility] liaison note     This patient is a current hospice patient with [facility].    [name] is approved for discharge to [facility] by Dr. Anne Fu, [facility] Physician. This has been discussed with Jomarie Longs, her son and he is in agreement with the plan as well.   Discussed plan of care with bedside nurse who will assist with coordination of transport.     Please don't hesitate to call with any Hospice related questions or concerns.    Thank you for the opportunity to participate in this patient's care.  Glenna Fellows, BSN, RN, OCN [employer] [phone]

## 2023-11-26 NOTE — ED Provider Notes (Signed)
 Emergency Medicine Observation Re-evaluation Note  Jennifer [name] is a 68 y.o. female, seen on rounds today.  Pt initially presented to the ED for complaints of Seizures Currently, the patient is awaiting placement at [facility] under hospice care for glioblastoma  Physical Exam  BP 94/68   Pulse 96   Temp 99.6 F (37.6 C) (Axillary)   Resp 16   SpO2 98%  Physical Exam General: NAD Cardiac: RR Lungs: even unlabored Psych: sleepy  ED Course / MDM  EKG:EKG Interpretation Date/Time:  Wednesday November 25 2023 20:46:39 EDT Ventricular Rate:  122 PR Interval:  126 QRS Duration:  83 QT Interval:  336 QTC Calculation: 479 R Axis:   -22  Text Interpretation: Sinus tachycardia Ventricular premature complex Aberrant complex Borderline left axis deviation Low voltage, precordial leads Abnormal R-wave progression, early transition Consider anterior infarct Confirmed by Derwood Kaplan [phone]) on 11/25/2023 10:46:25 PM  I have reviewed the labs performed to date as well as medications administered while in observation.  Recent changes in the last 24 hours include none.  Plan  Current plan is for admission to [facility] for continued care. Alvira Monday, MD 11/27/23 [phone]

## 2023-11-26 NOTE — ED Notes (Signed)
 Morphine gtt stopped, waste amount: 35mg /35ML. Wasted by [employer], witnessed by [employer].

## 2023-11-26 NOTE — Discharge Planning (Signed)
 Pt currently active with [facility], contracting  for Hospice services. as confirmed by [facility] with Shawn of [facility].  Pt will discharge to [facility] for residential hospice care.

## 2023-12-01 ENCOUNTER — Inpatient Hospital Stay (HOSPITAL_BASED_OUTPATIENT_CLINIC_OR_DEPARTMENT_OTHER): Payer: Medicare Other

## 2023-12-24 DEATH — deceased

## 2024-09-05 ENCOUNTER — Other Ambulatory Visit (HOSPITAL_COMMUNITY): Payer: Self-pay
